# Patient Record
Sex: Male | Born: 1955 | Race: White | Hispanic: No | Marital: Married | Smoking: Current every day smoker
Health system: Southern US, Community
[De-identification: ages and names within clinical notes are randomized; demographics above are authoritative.]

## PROBLEM LIST (undated history)

## (undated) DIAGNOSIS — H9190 Unspecified hearing loss, unspecified ear: Secondary | ICD-10-CM

## (undated) DIAGNOSIS — F1190 Opioid use, unspecified, uncomplicated: Secondary | ICD-10-CM

## (undated) DIAGNOSIS — L97329 Non-pressure chronic ulcer of left ankle with unspecified severity: Secondary | ICD-10-CM

## (undated) DIAGNOSIS — T8859XA Other complications of anesthesia, initial encounter: Secondary | ICD-10-CM

## (undated) DIAGNOSIS — Z720 Tobacco use: Secondary | ICD-10-CM

## (undated) DIAGNOSIS — E109 Type 1 diabetes mellitus without complications: Secondary | ICD-10-CM

## (undated) DIAGNOSIS — F329 Major depressive disorder, single episode, unspecified: Secondary | ICD-10-CM

## (undated) DIAGNOSIS — F32A Depression, unspecified: Secondary | ICD-10-CM

## (undated) DIAGNOSIS — T4145XA Adverse effect of unspecified anesthetic, initial encounter: Secondary | ICD-10-CM

## (undated) DIAGNOSIS — F419 Anxiety disorder, unspecified: Secondary | ICD-10-CM

## (undated) DIAGNOSIS — G4700 Insomnia, unspecified: Secondary | ICD-10-CM

## (undated) DIAGNOSIS — E785 Hyperlipidemia, unspecified: Secondary | ICD-10-CM

## (undated) DIAGNOSIS — H539 Unspecified visual disturbance: Secondary | ICD-10-CM

## (undated) DIAGNOSIS — M1990 Unspecified osteoarthritis, unspecified site: Secondary | ICD-10-CM

## (undated) DIAGNOSIS — I10 Essential (primary) hypertension: Secondary | ICD-10-CM

## (undated) DIAGNOSIS — E1142 Type 2 diabetes mellitus with diabetic polyneuropathy: Secondary | ICD-10-CM

## (undated) HISTORY — DX: Major depressive disorder, single episode, unspecified: F32.9

## (undated) HISTORY — DX: Unspecified osteoarthritis, unspecified site: M19.90

## (undated) HISTORY — DX: Hyperlipidemia, unspecified: E78.5

## (undated) HISTORY — DX: Insomnia, unspecified: G47.00

## (undated) HISTORY — PX: TONSILLECTOMY: SUR1361

## (undated) HISTORY — DX: Anxiety disorder, unspecified: F41.9

## (undated) HISTORY — DX: Tobacco use: Z72.0

## (undated) HISTORY — DX: Unspecified hearing loss, unspecified ear: H91.90

## (undated) HISTORY — DX: Essential (primary) hypertension: I10

## (undated) HISTORY — PX: FOOT SURGERY: SHX648

## (undated) HISTORY — DX: Unspecified visual disturbance: H53.9

## (undated) HISTORY — DX: Depression, unspecified: F32.A

---

## 1997-12-15 ENCOUNTER — Emergency Department (HOSPITAL_COMMUNITY): Admission: EM | Admit: 1997-12-15 | Discharge: 1997-12-15 | Payer: Self-pay | Admitting: Emergency Medicine

## 1997-12-23 ENCOUNTER — Encounter: Payer: Self-pay | Admitting: Neurosurgery

## 1997-12-23 HISTORY — PX: LUMBAR LAMINECTOMY/DECOMPRESSION MICRODISCECTOMY: SHX5026

## 1997-12-24 ENCOUNTER — Encounter: Payer: Self-pay | Admitting: Neurosurgery

## 1997-12-24 ENCOUNTER — Inpatient Hospital Stay (HOSPITAL_COMMUNITY): Admission: AD | Admit: 1997-12-24 | Discharge: 1997-12-25 | Payer: Self-pay | Admitting: Neurosurgery

## 1998-01-04 ENCOUNTER — Encounter: Payer: Self-pay | Admitting: Neurosurgery

## 1998-01-05 ENCOUNTER — Encounter: Payer: Self-pay | Admitting: Neurosurgery

## 1998-01-05 ENCOUNTER — Inpatient Hospital Stay (HOSPITAL_COMMUNITY): Admission: RE | Admit: 1998-01-05 | Discharge: 1998-01-06 | Payer: Self-pay | Admitting: Neurosurgery

## 1998-03-22 ENCOUNTER — Ambulatory Visit (HOSPITAL_COMMUNITY): Admission: RE | Admit: 1998-03-22 | Discharge: 1998-03-22 | Payer: Self-pay | Admitting: Neurosurgery

## 2000-12-28 ENCOUNTER — Emergency Department (HOSPITAL_COMMUNITY): Admission: EM | Admit: 2000-12-28 | Discharge: 2000-12-28 | Payer: Self-pay | Admitting: Emergency Medicine

## 2000-12-28 ENCOUNTER — Encounter: Payer: Self-pay | Admitting: Emergency Medicine

## 2002-10-30 ENCOUNTER — Encounter (HOSPITAL_BASED_OUTPATIENT_CLINIC_OR_DEPARTMENT_OTHER): Payer: Self-pay | Admitting: Internal Medicine

## 2002-10-30 ENCOUNTER — Encounter: Admission: RE | Admit: 2002-10-30 | Discharge: 2002-10-30 | Payer: Self-pay | Admitting: Internal Medicine

## 2003-07-03 ENCOUNTER — Emergency Department (HOSPITAL_COMMUNITY): Admission: EM | Admit: 2003-07-03 | Discharge: 2003-07-04 | Payer: Self-pay | Admitting: Emergency Medicine

## 2003-08-03 ENCOUNTER — Ambulatory Visit: Admission: RE | Admit: 2003-08-03 | Discharge: 2003-08-03 | Payer: Self-pay | Admitting: Internal Medicine

## 2004-07-05 ENCOUNTER — Ambulatory Visit (HOSPITAL_COMMUNITY): Admission: RE | Admit: 2004-07-05 | Discharge: 2004-07-05 | Payer: Self-pay | Admitting: Orthopedic Surgery

## 2008-09-04 ENCOUNTER — Encounter: Admission: RE | Admit: 2008-09-04 | Discharge: 2008-09-04 | Payer: Self-pay | Admitting: Sports Medicine

## 2010-03-28 ENCOUNTER — Encounter (INDEPENDENT_AMBULATORY_CARE_PROVIDER_SITE_OTHER): Payer: Self-pay | Admitting: *Deleted

## 2010-04-05 NOTE — Letter (Signed)
Summary: Pre Visit Letter Revised  [facility]  [address] [place], [place] [address]   Phone: [phone]  Fax: [phone]        03/28/2010 MRN: [id number] [facility] [address] [place], [place]  [address]             Procedure Date:  05-09-10           Direct Colon---Dr. Jarold Motto   Welcome to the Gastroenterology Division at [facility].    You are scheduled to see a nurse for your pre-procedure visit on 04-25-10 at 1:30p.m. on the 3rd floor at [facility], [address]  We ask that you try to arrive at our office 15 minutes prior to your appointment time to allow for check-in.  Please take a minute to review the attached form.  If you answer "Yes" to one or more of the questions on the first page, we ask that you call the person listed at your earliest opportunity.  If you answer "No" to all of the questions, please complete the rest of the form and bring it to your appointment.    Your nurse visit will consist of discussing your medical and surgical history, your immediate family medical history, and your medications.   If you are unable to list all of your medications on the form, please bring the medication bottles to your appointment and we will list them.  We will need to be aware of both prescribed and over the counter drugs.  We will need to know exact dosage information as well.    Please be prepared to read and sign documents such as consent forms, a financial agreement, and acknowledgement forms.  If necessary, and with your consent, a friend or relative is welcome to sit-in on the nurse visit with you.  Please bring your insurance card so that we may make a copy of it.  If your insurance requires a referral to see a specialist, please bring your referral form from your primary care physician.  No co-pay is required for this nurse visit.     If you cannot keep your appointment, please call [phone] to cancel or reschedule prior to your  appointment date.  This allows [place] the opportunity to schedule an appointment for another patient in need of care.    Thank you for choosing [facility] for your medical needs.  We appreciate the opportunity to care for you.  Please visit [place] at our website  to learn more about our practice.  Sincerely, The Gastroenterology Division

## 2010-04-25 ENCOUNTER — Ambulatory Visit (AMBULATORY_SURGERY_CENTER): Payer: Medicare Other | Admitting: *Deleted

## 2010-04-25 VITALS — Ht 70.5 in | Wt 177.0 lb

## 2010-04-25 DIAGNOSIS — Z1211 Encounter for screening for malignant neoplasm of colon: Secondary | ICD-10-CM

## 2010-04-25 MED ORDER — PEG-KCL-NACL-NASULF-NA ASC-C 100 G PO SOLR: ORAL | Status: DC

## 2010-05-09 ENCOUNTER — Other Ambulatory Visit: Payer: Self-pay | Admitting: Gastroenterology

## 2010-06-01 ENCOUNTER — Encounter: Payer: Medicare Other | Admitting: Gastroenterology

## 2010-06-03 NOTE — Op Note (Signed)
[place]  Patient:    [name], [name] Visit Number: [id number] MRN: [id number]          Service Type: EXP Location: [facility] Attending Physician:  Tobey Bride Proc. Date: 12/23/97 Admit Date:  12/28/2000 Discharge Date: 12/28/2000                             Operative Report  PREOPERATIVE DIAGNOSIS:  Left herniated nucleus pulposus, L5-S1.  POSTOPERATIVE DIAGNOSIS:  Left herniated nucleus pulposus, L5-S1.  OPERATION:  Left L5-S1 semihemilaminectomy and diskectomy with microscope for microdissection.  SURGEON:  Clydene Fake, M.D.  ASSISTANTClide Cliff D. Gasper Sells, M.D.  ANESTHESIA:  General endotracheal tube.  ESTIMATED BLOOD LOSS:  Minimal.  BLOOD GIVEN:  None.  DRAINS:  None.  COMPLICATIONS:  None.  INDICATIONS:  The patient is a 55 year old man, who has had severe left lower extremity pain going towards his heel with numbness of his foot and weakness on  plantar flexion, who had an MRI and had a large disk herniation at left-sided 5-1. The patient admitted for surgery.  DESCRIPTION OF PROCEDURE:  The patient brought to the operating room and general anesthesia was induced.  The patient was placed in a prone position on the Wilson frame and all pressure points were padded.  The patient was then prepped and draped in the usual sterile fashion.  The site of the incision was injected with 10 cc of 1% lidocaine with epinephrine.  The needle was placed in back and x-ray obtained showing it was pointing at the L5 disk space.  An incision was then made in the  midline just below where the needle was inserted.  The incision was taken down o the fascia and the fascia was incised on the left side and subperiosteal dissection was done over the L5 and S2 to the spinal processes and lamina.  A marker was placed at the L5-S1 interspace and an x-ray obtained showing that we were at the correct interspace.  The microscope was brought  and under the microscope, microscopic dissection was done using the drill to perform a semihemilaminectomy at the L5-S1.  Kerrison punches were then used to complete this.  The ligamentum flavum was removed and nerve root and dura were seen.  The S-1 nerve root was retracted gently medially and the disk space was found with herniated disk. The disk space was incised with #15 blade and diskectomy was performed with pituitary rongeurs and curettes.  There was a low lying L5 nerve root seen, in fact, coming off of the socket, pretty much horizontal with the foramina there, and there was a small free fragment of disk under the dura.  After decompressing nerve roots, the wound was irrigated with antibiotic solution.  Hemostasis in the epidural space was obtained with bipolar cauterization and Gelfoam and thrombin.  The retractors were removed.  Marcaine was injected into the paraspinous muscles and was irrigated with antibiotic solution some more.  The fascia was then closed with 0 Vicryl in interrupted sutures and the subcutaneous tissue was closed with 0, 2-0 and 3-0 Vicryl in interrupted sutures.  The wound was steri-stripped.  Dressing was placed. The patient was placed back in the supine position and transferred to the recovery room. Attending Physician:  Tobey Bride DD:  12/23/97 TD:  12/25/97 Job: [id number]

## 2010-06-16 ENCOUNTER — Telehealth: Payer: Self-pay | Admitting: Internal Medicine

## 2010-06-16 NOTE — Telephone Encounter (Signed)
Not my patient. Thanks.

## 2010-06-17 NOTE — Telephone Encounter (Signed)
Patient rescheduled colon out side of the alloted time, Per Dr Jarold Motto bill for reschedule.

## 2010-06-17 NOTE — Telephone Encounter (Signed)
What is this about?

## 2010-06-20 ENCOUNTER — Encounter: Payer: Medicare Other | Admitting: Gastroenterology

## 2010-07-26 ENCOUNTER — Encounter: Payer: Self-pay | Admitting: Gastroenterology

## 2010-07-26 ENCOUNTER — Ambulatory Visit (AMBULATORY_SURGERY_CENTER): Payer: Medicare Other | Admitting: Gastroenterology

## 2010-07-26 VITALS — BP 137/64 | HR 64 | Temp 97.6°F | Resp 22 | Ht 70.5 in | Wt 177.0 lb

## 2010-07-26 DIAGNOSIS — Z1211 Encounter for screening for malignant neoplasm of colon: Secondary | ICD-10-CM | POA: Insufficient documentation

## 2010-07-26 HISTORY — PX: COLONOSCOPY WITH PROPOFOL: SHX5780

## 2010-07-26 MED ORDER — SODIUM CHLORIDE 0.9 % IV SOLN: 500.0000 mL | INTRAVENOUS | Status: DC

## 2010-07-26 NOTE — Patient Instructions (Signed)
Please read the handouts given to you by your recovery room nurse.   Due to your hemorrhoids, please increase the fiber in your diet.  Your next colonoscopy will be in 10 years, and we will send you a reminder.   If you have any questions or concerns, please call us at [phone]. Thank-you.

## 2010-07-27 ENCOUNTER — Telehealth: Payer: Self-pay

## 2010-07-27 NOTE — Telephone Encounter (Signed)

## 2010-09-21 ENCOUNTER — Other Ambulatory Visit: Payer: Self-pay | Admitting: Gastroenterology

## 2011-03-09 DIAGNOSIS — R05 Cough: Secondary | ICD-10-CM | POA: Diagnosis not present

## 2011-03-09 DIAGNOSIS — R059 Cough, unspecified: Secondary | ICD-10-CM | POA: Diagnosis not present

## 2011-03-09 DIAGNOSIS — I10 Essential (primary) hypertension: Secondary | ICD-10-CM | POA: Diagnosis not present

## 2011-03-09 DIAGNOSIS — E291 Testicular hypofunction: Secondary | ICD-10-CM | POA: Diagnosis not present

## 2011-03-09 DIAGNOSIS — E1139 Type 2 diabetes mellitus with other diabetic ophthalmic complication: Secondary | ICD-10-CM | POA: Diagnosis not present

## 2011-06-22 DIAGNOSIS — M79609 Pain in unspecified limb: Secondary | ICD-10-CM | POA: Diagnosis not present

## 2011-06-22 DIAGNOSIS — I739 Peripheral vascular disease, unspecified: Secondary | ICD-10-CM | POA: Diagnosis not present

## 2011-06-22 DIAGNOSIS — E1159 Type 2 diabetes mellitus with other circulatory complications: Secondary | ICD-10-CM | POA: Diagnosis not present

## 2011-06-22 DIAGNOSIS — E1139 Type 2 diabetes mellitus with other diabetic ophthalmic complication: Secondary | ICD-10-CM | POA: Diagnosis not present

## 2011-09-28 DIAGNOSIS — Z23 Encounter for immunization: Secondary | ICD-10-CM | POA: Diagnosis not present

## 2011-09-28 DIAGNOSIS — E1159 Type 2 diabetes mellitus with other circulatory complications: Secondary | ICD-10-CM | POA: Diagnosis not present

## 2011-09-28 DIAGNOSIS — I10 Essential (primary) hypertension: Secondary | ICD-10-CM | POA: Diagnosis not present

## 2011-09-28 DIAGNOSIS — R05 Cough: Secondary | ICD-10-CM | POA: Diagnosis not present

## 2011-09-28 DIAGNOSIS — R059 Cough, unspecified: Secondary | ICD-10-CM | POA: Diagnosis not present

## 2012-01-12 ENCOUNTER — Other Ambulatory Visit: Payer: Self-pay | Admitting: Gastroenterology

## 2012-01-26 DIAGNOSIS — I739 Peripheral vascular disease, unspecified: Secondary | ICD-10-CM | POA: Diagnosis not present

## 2012-01-26 DIAGNOSIS — E1159 Type 2 diabetes mellitus with other circulatory complications: Secondary | ICD-10-CM | POA: Diagnosis not present

## 2012-01-26 DIAGNOSIS — M545 Low back pain, unspecified: Secondary | ICD-10-CM | POA: Diagnosis not present

## 2012-01-26 DIAGNOSIS — I10 Essential (primary) hypertension: Secondary | ICD-10-CM | POA: Diagnosis not present

## 2012-04-02 DIAGNOSIS — M722 Plantar fascial fibromatosis: Secondary | ICD-10-CM | POA: Diagnosis not present

## 2012-04-17 DIAGNOSIS — I10 Essential (primary) hypertension: Secondary | ICD-10-CM | POA: Diagnosis not present

## 2012-04-17 DIAGNOSIS — E1139 Type 2 diabetes mellitus with other diabetic ophthalmic complication: Secondary | ICD-10-CM | POA: Diagnosis not present

## 2012-04-17 DIAGNOSIS — Z125 Encounter for screening for malignant neoplasm of prostate: Secondary | ICD-10-CM | POA: Diagnosis not present

## 2012-04-17 DIAGNOSIS — E785 Hyperlipidemia, unspecified: Secondary | ICD-10-CM | POA: Diagnosis not present

## 2012-04-17 DIAGNOSIS — I739 Peripheral vascular disease, unspecified: Secondary | ICD-10-CM | POA: Diagnosis not present

## 2012-04-22 DIAGNOSIS — Z0000 Encounter for general adult medical examination without abnormal findings: Secondary | ICD-10-CM | POA: Diagnosis not present

## 2012-04-22 DIAGNOSIS — Z125 Encounter for screening for malignant neoplasm of prostate: Secondary | ICD-10-CM | POA: Diagnosis not present

## 2012-04-22 DIAGNOSIS — I10 Essential (primary) hypertension: Secondary | ICD-10-CM | POA: Diagnosis not present

## 2012-04-22 DIAGNOSIS — E291 Testicular hypofunction: Secondary | ICD-10-CM | POA: Diagnosis not present

## 2012-04-22 DIAGNOSIS — R05 Cough: Secondary | ICD-10-CM | POA: Diagnosis not present

## 2012-04-22 DIAGNOSIS — Z1212 Encounter for screening for malignant neoplasm of rectum: Secondary | ICD-10-CM | POA: Diagnosis not present

## 2012-04-22 DIAGNOSIS — E1159 Type 2 diabetes mellitus with other circulatory complications: Secondary | ICD-10-CM | POA: Diagnosis not present

## 2012-04-22 DIAGNOSIS — R059 Cough, unspecified: Secondary | ICD-10-CM | POA: Diagnosis not present

## 2012-04-22 DIAGNOSIS — E785 Hyperlipidemia, unspecified: Secondary | ICD-10-CM | POA: Diagnosis not present

## 2012-04-22 DIAGNOSIS — N182 Chronic kidney disease, stage 2 (mild): Secondary | ICD-10-CM | POA: Diagnosis not present

## 2012-04-22 DIAGNOSIS — I739 Peripheral vascular disease, unspecified: Secondary | ICD-10-CM | POA: Diagnosis not present

## 2012-05-09 DIAGNOSIS — E1159 Type 2 diabetes mellitus with other circulatory complications: Secondary | ICD-10-CM | POA: Diagnosis not present

## 2012-05-09 DIAGNOSIS — I10 Essential (primary) hypertension: Secondary | ICD-10-CM | POA: Diagnosis not present

## 2012-05-09 DIAGNOSIS — N529 Male erectile dysfunction, unspecified: Secondary | ICD-10-CM | POA: Diagnosis not present

## 2012-05-09 DIAGNOSIS — E291 Testicular hypofunction: Secondary | ICD-10-CM | POA: Diagnosis not present

## 2012-05-13 DIAGNOSIS — H16149 Punctate keratitis, unspecified eye: Secondary | ICD-10-CM | POA: Diagnosis not present

## 2012-05-14 DIAGNOSIS — M722 Plantar fascial fibromatosis: Secondary | ICD-10-CM | POA: Diagnosis not present

## 2012-05-14 DIAGNOSIS — D1739 Benign lipomatous neoplasm of skin and subcutaneous tissue of other sites: Secondary | ICD-10-CM | POA: Diagnosis not present

## 2012-05-27 DIAGNOSIS — H16149 Punctate keratitis, unspecified eye: Secondary | ICD-10-CM | POA: Diagnosis not present

## 2012-05-28 DIAGNOSIS — I10 Essential (primary) hypertension: Secondary | ICD-10-CM | POA: Diagnosis not present

## 2012-05-28 DIAGNOSIS — R609 Edema, unspecified: Secondary | ICD-10-CM | POA: Diagnosis not present

## 2012-05-28 DIAGNOSIS — E1139 Type 2 diabetes mellitus with other diabetic ophthalmic complication: Secondary | ICD-10-CM | POA: Diagnosis not present

## 2012-05-28 DIAGNOSIS — E1159 Type 2 diabetes mellitus with other circulatory complications: Secondary | ICD-10-CM | POA: Diagnosis not present

## 2012-05-28 DIAGNOSIS — Z6825 Body mass index (BMI) 25.0-25.9, adult: Secondary | ICD-10-CM | POA: Diagnosis not present

## 2012-05-28 DIAGNOSIS — N182 Chronic kidney disease, stage 2 (mild): Secondary | ICD-10-CM | POA: Diagnosis not present

## 2012-06-12 ENCOUNTER — Encounter (INDEPENDENT_AMBULATORY_CARE_PROVIDER_SITE_OTHER): Payer: Medicare Other | Admitting: *Deleted

## 2012-06-12 DIAGNOSIS — Z6825 Body mass index (BMI) 25.0-25.9, adult: Secondary | ICD-10-CM | POA: Diagnosis not present

## 2012-06-12 DIAGNOSIS — R609 Edema, unspecified: Secondary | ICD-10-CM

## 2012-06-12 DIAGNOSIS — E1139 Type 2 diabetes mellitus with other diabetic ophthalmic complication: Secondary | ICD-10-CM | POA: Diagnosis not present

## 2012-06-12 DIAGNOSIS — I10 Essential (primary) hypertension: Secondary | ICD-10-CM | POA: Diagnosis not present

## 2012-06-17 ENCOUNTER — Encounter: Payer: Self-pay | Admitting: Internal Medicine

## 2012-07-01 DIAGNOSIS — M79609 Pain in unspecified limb: Secondary | ICD-10-CM | POA: Diagnosis not present

## 2012-07-03 DIAGNOSIS — M7989 Other specified soft tissue disorders: Secondary | ICD-10-CM | POA: Diagnosis not present

## 2012-07-03 DIAGNOSIS — L0291 Cutaneous abscess, unspecified: Secondary | ICD-10-CM | POA: Diagnosis not present

## 2012-07-04 DIAGNOSIS — I10 Essential (primary) hypertension: Secondary | ICD-10-CM | POA: Diagnosis not present

## 2012-07-04 DIAGNOSIS — L0291 Cutaneous abscess, unspecified: Secondary | ICD-10-CM | POA: Diagnosis not present

## 2012-07-18 ENCOUNTER — Other Ambulatory Visit: Payer: Self-pay | Admitting: Gastroenterology

## 2012-07-22 DIAGNOSIS — M7989 Other specified soft tissue disorders: Secondary | ICD-10-CM | POA: Diagnosis not present

## 2012-07-29 DIAGNOSIS — IMO0002 Reserved for concepts with insufficient information to code with codable children: Secondary | ICD-10-CM | POA: Diagnosis not present

## 2012-08-01 DIAGNOSIS — IMO0002 Reserved for concepts with insufficient information to code with codable children: Secondary | ICD-10-CM | POA: Diagnosis not present

## 2012-08-05 DIAGNOSIS — IMO0002 Reserved for concepts with insufficient information to code with codable children: Secondary | ICD-10-CM | POA: Diagnosis not present

## 2012-08-06 DIAGNOSIS — IMO0002 Reserved for concepts with insufficient information to code with codable children: Secondary | ICD-10-CM | POA: Diagnosis not present

## 2012-08-13 DIAGNOSIS — IMO0002 Reserved for concepts with insufficient information to code with codable children: Secondary | ICD-10-CM | POA: Diagnosis not present

## 2012-08-19 DIAGNOSIS — IMO0002 Reserved for concepts with insufficient information to code with codable children: Secondary | ICD-10-CM | POA: Diagnosis not present

## 2012-08-22 DIAGNOSIS — M79609 Pain in unspecified limb: Secondary | ICD-10-CM | POA: Diagnosis not present

## 2012-08-22 DIAGNOSIS — IMO0002 Reserved for concepts with insufficient information to code with codable children: Secondary | ICD-10-CM | POA: Diagnosis not present

## 2012-08-22 DIAGNOSIS — E1139 Type 2 diabetes mellitus with other diabetic ophthalmic complication: Secondary | ICD-10-CM | POA: Diagnosis not present

## 2012-08-22 DIAGNOSIS — Z6825 Body mass index (BMI) 25.0-25.9, adult: Secondary | ICD-10-CM | POA: Diagnosis not present

## 2012-08-22 DIAGNOSIS — E1159 Type 2 diabetes mellitus with other circulatory complications: Secondary | ICD-10-CM | POA: Diagnosis not present

## 2012-08-22 DIAGNOSIS — I10 Essential (primary) hypertension: Secondary | ICD-10-CM | POA: Diagnosis not present

## 2012-08-22 DIAGNOSIS — I739 Peripheral vascular disease, unspecified: Secondary | ICD-10-CM | POA: Diagnosis not present

## 2012-08-23 DIAGNOSIS — M7989 Other specified soft tissue disorders: Secondary | ICD-10-CM | POA: Diagnosis not present

## 2012-10-01 ENCOUNTER — Ambulatory Visit: Payer: Medicare Other | Admitting: Endocrinology

## 2012-10-02 ENCOUNTER — Ambulatory Visit (INDEPENDENT_AMBULATORY_CARE_PROVIDER_SITE_OTHER): Payer: Medicare Other | Admitting: Endocrinology

## 2012-10-02 ENCOUNTER — Encounter: Payer: Self-pay | Admitting: Endocrinology

## 2012-10-02 VITALS — BP 122/72 | HR 65 | Ht 70.0 in | Wt 184.0 lb

## 2012-10-02 DIAGNOSIS — E1049 Type 1 diabetes mellitus with other diabetic neurological complication: Secondary | ICD-10-CM | POA: Diagnosis not present

## 2012-10-02 DIAGNOSIS — M25562 Pain in left knee: Secondary | ICD-10-CM

## 2012-10-02 DIAGNOSIS — M25569 Pain in unspecified knee: Secondary | ICD-10-CM | POA: Insufficient documentation

## 2012-10-02 DIAGNOSIS — I10 Essential (primary) hypertension: Secondary | ICD-10-CM

## 2012-10-02 DIAGNOSIS — E785 Hyperlipidemia, unspecified: Secondary | ICD-10-CM | POA: Diagnosis not present

## 2012-10-02 DIAGNOSIS — G4700 Insomnia, unspecified: Secondary | ICD-10-CM

## 2012-10-02 NOTE — Patient Instructions (Addendum)
Let's check a circulation test.  you will receive a phone call, about a day and time for an appointment. If this does not show a cause, the only other test i can think of is a nerve-ending test.  Please let me know if you want me to request this.
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
## 2012-10-03 DIAGNOSIS — J189 Pneumonia, unspecified organism: Secondary | ICD-10-CM | POA: Insufficient documentation

## 2012-10-03 DIAGNOSIS — G4700 Insomnia, unspecified: Secondary | ICD-10-CM | POA: Insufficient documentation

## 2012-10-03 DIAGNOSIS — E1049 Type 1 diabetes mellitus with other diabetic neurological complication: Secondary | ICD-10-CM | POA: Insufficient documentation

## 2012-10-03 DIAGNOSIS — E785 Hyperlipidemia, unspecified: Secondary | ICD-10-CM | POA: Insufficient documentation

## 2012-10-07 ENCOUNTER — Encounter (INDEPENDENT_AMBULATORY_CARE_PROVIDER_SITE_OTHER): Payer: Medicare Other

## 2012-10-07 DIAGNOSIS — I739 Peripheral vascular disease, unspecified: Secondary | ICD-10-CM | POA: Diagnosis not present

## 2012-10-07 DIAGNOSIS — R200 Anesthesia of skin: Secondary | ICD-10-CM

## 2012-12-04 DIAGNOSIS — I739 Peripheral vascular disease, unspecified: Secondary | ICD-10-CM | POA: Diagnosis not present

## 2012-12-04 DIAGNOSIS — F329 Major depressive disorder, single episode, unspecified: Secondary | ICD-10-CM | POA: Diagnosis not present

## 2012-12-04 DIAGNOSIS — M545 Low back pain, unspecified: Secondary | ICD-10-CM | POA: Diagnosis not present

## 2012-12-04 DIAGNOSIS — E11319 Type 2 diabetes mellitus with unspecified diabetic retinopathy without macular edema: Secondary | ICD-10-CM | POA: Diagnosis not present

## 2012-12-04 DIAGNOSIS — E119 Type 2 diabetes mellitus without complications: Secondary | ICD-10-CM | POA: Diagnosis not present

## 2012-12-04 DIAGNOSIS — F3289 Other specified depressive episodes: Secondary | ICD-10-CM | POA: Diagnosis not present

## 2012-12-04 DIAGNOSIS — I10 Essential (primary) hypertension: Secondary | ICD-10-CM | POA: Diagnosis not present

## 2012-12-04 DIAGNOSIS — E1159 Type 2 diabetes mellitus with other circulatory complications: Secondary | ICD-10-CM | POA: Diagnosis not present

## 2012-12-04 DIAGNOSIS — L97909 Non-pressure chronic ulcer of unspecified part of unspecified lower leg with unspecified severity: Secondary | ICD-10-CM | POA: Diagnosis not present

## 2012-12-10 ENCOUNTER — Encounter (HOSPITAL_BASED_OUTPATIENT_CLINIC_OR_DEPARTMENT_OTHER): Payer: Medicare Other

## 2012-12-16 ENCOUNTER — Encounter (HOSPITAL_BASED_OUTPATIENT_CLINIC_OR_DEPARTMENT_OTHER): Payer: Medicare Other | Attending: General Surgery

## 2012-12-16 DIAGNOSIS — I10 Essential (primary) hypertension: Secondary | ICD-10-CM | POA: Insufficient documentation

## 2012-12-16 DIAGNOSIS — A4901 Methicillin susceptible Staphylococcus aureus infection, unspecified site: Secondary | ICD-10-CM | POA: Insufficient documentation

## 2012-12-16 DIAGNOSIS — E1149 Type 2 diabetes mellitus with other diabetic neurological complication: Secondary | ICD-10-CM | POA: Insufficient documentation

## 2012-12-16 DIAGNOSIS — L97309 Non-pressure chronic ulcer of unspecified ankle with unspecified severity: Secondary | ICD-10-CM | POA: Insufficient documentation

## 2012-12-16 DIAGNOSIS — Z794 Long term (current) use of insulin: Secondary | ICD-10-CM | POA: Diagnosis not present

## 2012-12-16 DIAGNOSIS — E1142 Type 2 diabetes mellitus with diabetic polyneuropathy: Secondary | ICD-10-CM | POA: Diagnosis not present

## 2012-12-16 DIAGNOSIS — Z79899 Other long term (current) drug therapy: Secondary | ICD-10-CM | POA: Insufficient documentation

## 2012-12-16 DIAGNOSIS — E1169 Type 2 diabetes mellitus with other specified complication: Secondary | ICD-10-CM | POA: Insufficient documentation

## 2012-12-17 NOTE — H&P (Signed)
[name], [name]                  ACCOUNT NO.:  [id number]  MEDICAL RECORD NO.:  [id number]  LOCATION:  FOOT                         FACILITY:  [facility]  PHYSICIAN:  Joanne Gavel, M.D.        DATE OF BIRTH:  02/01/1955  DATE OF ADMISSION:  12/16/2012 DATE OF DISCHARGE:                             HISTORY & PHYSICAL   CHIEF COMPLAINT:  Wound, left ankle.  HISTORY OF PRESENT ILLNESS:  This is a 57 year old diabetic male developed this wound, approximately 8 months ago.  She has been treated with antibiotic ointments basically, started out as a blister.  It is not particularly painful or tender.  The patient has significant neuropathy particularly of the left foot.  PAST MEDICAL HISTORY:  Significant for diabetes, peripheral neuropathy, depression, hypertension, hyperlipidemia.  PAST SURGICAL HISTORY:  She has had foot surgery with hardware in the left ankle.  She has had back surgery.  SOCIAL HISTORY:  Cigarettes 1/2 to 1 pack per day.  Alcohol none.  MEDICATIONS:  Doxazosin, Lopid, Flonase, amlodipine, metformin, Benicar, benzonatate, Ambien, Cialis, Depakote, Lantus insulin, methadone, Naprosyn, Neurontin, Pamelor, testosterone cypionate, Valium, Glucotrol, hydrocodone, Robaxin, Zofran, Zoloft, simvastatin, baby aspirin, Lopressor, Viagra.  ALLERGIES:  CODEINE causes vomiting, AZITHROMYCIN has caused hives.  REVIEW OF SYSTEMS:  The patient has no history of digestive difficulty, no history of asthma, emphysema, bronchitis, pneumonia.  No myocardial infarction.  No Alzheimer's, recent convulsions, or tremors.  PHYSICAL EXAMINATION:  VITAL SIGNS:  Temperature 98.2, pulse 65, respirations 18, blood pressure 148/73.  Glucose is 120. GENERAL:  Well developed, well nourished. CHEST:  Clear. HEART:  Regular rhythm. EXTREMITIES:  Examination of the lower extremities reveals an ABI on the left of 1.1, on the medial malleolus there is a 0.5 x 0.7 x 0.1 wound with a slightly shaggy  base.  There is no surrounding inflammation.  The wound is at the site of a previous surgical scar.  IMPRESSION:  Diabetic ulcer Wagner II, left medial malleolus.  The patient tells [place] that she has already had x-rays and MRIs and rather recent blood work, so we will try and obtain this information rather than repeat it.  She has a bounding dorsalis pedis and posterior tibial pulse, so we will forego vascular studies at present.  We will start treating with Santyl and Hydrogel.  We will see her in 7 days.     Joanne Gavel, M.D.     RA/MEDQ  D:  12/16/2012  T:  12/17/2012  Job:  [id number]

## 2012-12-23 DIAGNOSIS — A4901 Methicillin susceptible Staphylococcus aureus infection, unspecified site: Secondary | ICD-10-CM | POA: Diagnosis not present

## 2012-12-23 DIAGNOSIS — L97309 Non-pressure chronic ulcer of unspecified ankle with unspecified severity: Secondary | ICD-10-CM | POA: Diagnosis not present

## 2012-12-23 DIAGNOSIS — E1149 Type 2 diabetes mellitus with other diabetic neurological complication: Secondary | ICD-10-CM | POA: Diagnosis not present

## 2012-12-23 DIAGNOSIS — E1169 Type 2 diabetes mellitus with other specified complication: Secondary | ICD-10-CM | POA: Diagnosis not present

## 2012-12-30 DIAGNOSIS — E1169 Type 2 diabetes mellitus with other specified complication: Secondary | ICD-10-CM | POA: Diagnosis not present

## 2012-12-30 DIAGNOSIS — A4901 Methicillin susceptible Staphylococcus aureus infection, unspecified site: Secondary | ICD-10-CM | POA: Diagnosis not present

## 2012-12-30 DIAGNOSIS — L97309 Non-pressure chronic ulcer of unspecified ankle with unspecified severity: Secondary | ICD-10-CM | POA: Diagnosis not present

## 2012-12-30 DIAGNOSIS — E1149 Type 2 diabetes mellitus with other diabetic neurological complication: Secondary | ICD-10-CM | POA: Diagnosis not present

## 2013-01-03 DIAGNOSIS — E1149 Type 2 diabetes mellitus with other diabetic neurological complication: Secondary | ICD-10-CM | POA: Diagnosis not present

## 2013-01-03 DIAGNOSIS — A4901 Methicillin susceptible Staphylococcus aureus infection, unspecified site: Secondary | ICD-10-CM | POA: Diagnosis not present

## 2013-01-03 DIAGNOSIS — E1169 Type 2 diabetes mellitus with other specified complication: Secondary | ICD-10-CM | POA: Diagnosis not present

## 2013-01-03 DIAGNOSIS — L97309 Non-pressure chronic ulcer of unspecified ankle with unspecified severity: Secondary | ICD-10-CM | POA: Diagnosis not present

## 2013-01-08 DIAGNOSIS — E1149 Type 2 diabetes mellitus with other diabetic neurological complication: Secondary | ICD-10-CM | POA: Diagnosis not present

## 2013-01-08 DIAGNOSIS — L97309 Non-pressure chronic ulcer of unspecified ankle with unspecified severity: Secondary | ICD-10-CM | POA: Diagnosis not present

## 2013-01-08 DIAGNOSIS — E1169 Type 2 diabetes mellitus with other specified complication: Secondary | ICD-10-CM | POA: Diagnosis not present

## 2013-01-08 DIAGNOSIS — A4901 Methicillin susceptible Staphylococcus aureus infection, unspecified site: Secondary | ICD-10-CM | POA: Diagnosis not present

## 2013-01-08 NOTE — Progress Notes (Signed)
[facility]  NAMEHELIO, LACK                  ACCOUNT NO.:  [id number]  MEDICAL RECORD NO.:  [id number]      DATE OF BIRTH:  04-13-1955  PHYSICIAN:  Ardath Sax, M.D.           VISIT DATE:                                  OFFICE VISIT   [name] is a 57 year old diabetic woman, who has been coming to the wound clinic for a couple of months now with an ulcer on the medial left ankle.  She has been treated with silver alginate and multiple debridements.  She was even treated with Santyl as there were times where she had some necrotic looking tissue.  She has been diabetic for several years and she takes Glucotrol for this problem.  She also takes Neurontin for diabetic neuropathy.  She takes Depakote and Benicar for hypertension.  She also is on Lantus insulin for her diabetes, and she is on Lopressor 50 mg a day for hypertension.  She is also on Zoloft. This woman has not really improved much despite using collagen, alginate, Santyl, and multiple debridements.  I felt that she has had some element of venous hypertension, but now I feel that I will classify this really as a diabetic foot ulcer Wagner 3, as it does have some infection.  It is cultured out gram-positive cocci, which proved to be Staph aureus.  She is presently on Bactrim.  I feel that in light of this being a Wagner 3 diabetic wound with some secondary infection, that she is a good candidate for hyperbaric oxygen treatments as she has not really progressed with the usual treatment.  I thought if we could get this cleaned up and put her in hyperbaric oxygen, and it improved enough, we could be able to start using either Apligraf or Dermagraft.     Ardath Sax, M.D.     PP/MEDQ  D:  01/08/2013  T:  01/08/2013  Job:  [id number]

## 2013-01-17 ENCOUNTER — Encounter (HOSPITAL_BASED_OUTPATIENT_CLINIC_OR_DEPARTMENT_OTHER): Payer: Medicare Other | Attending: General Surgery

## 2013-01-17 DIAGNOSIS — L97309 Non-pressure chronic ulcer of unspecified ankle with unspecified severity: Secondary | ICD-10-CM | POA: Insufficient documentation

## 2013-01-17 DIAGNOSIS — E1169 Type 2 diabetes mellitus with other specified complication: Secondary | ICD-10-CM | POA: Insufficient documentation

## 2013-01-24 ENCOUNTER — Other Ambulatory Visit (HOSPITAL_COMMUNITY): Payer: Self-pay | Admitting: General Surgery

## 2013-01-24 ENCOUNTER — Ambulatory Visit (HOSPITAL_COMMUNITY)
Admission: RE | Admit: 2013-01-24 | Discharge: 2013-01-24 | Disposition: A | Discharge status: Home / Self Care | Payer: Medicare Other | Source: Ambulatory Visit | Attending: General Surgery | Admitting: General Surgery

## 2013-01-24 DIAGNOSIS — L97309 Non-pressure chronic ulcer of unspecified ankle with unspecified severity: Secondary | ICD-10-CM | POA: Diagnosis not present

## 2013-01-24 DIAGNOSIS — E1169 Type 2 diabetes mellitus with other specified complication: Secondary | ICD-10-CM | POA: Diagnosis not present

## 2013-01-24 DIAGNOSIS — T148XXA Other injury of unspecified body region, initial encounter: Secondary | ICD-10-CM

## 2013-01-24 DIAGNOSIS — Z01818 Encounter for other preprocedural examination: Secondary | ICD-10-CM | POA: Diagnosis not present

## 2013-01-31 DIAGNOSIS — L97309 Non-pressure chronic ulcer of unspecified ankle with unspecified severity: Secondary | ICD-10-CM | POA: Diagnosis not present

## 2013-01-31 DIAGNOSIS — E1169 Type 2 diabetes mellitus with other specified complication: Secondary | ICD-10-CM | POA: Diagnosis not present

## 2013-02-07 DIAGNOSIS — E1169 Type 2 diabetes mellitus with other specified complication: Secondary | ICD-10-CM | POA: Diagnosis not present

## 2013-02-07 DIAGNOSIS — L97309 Non-pressure chronic ulcer of unspecified ankle with unspecified severity: Secondary | ICD-10-CM | POA: Diagnosis not present

## 2013-02-10 DIAGNOSIS — L97309 Non-pressure chronic ulcer of unspecified ankle with unspecified severity: Secondary | ICD-10-CM | POA: Diagnosis not present

## 2013-02-10 DIAGNOSIS — E1169 Type 2 diabetes mellitus with other specified complication: Secondary | ICD-10-CM | POA: Diagnosis not present

## 2013-02-10 LAB — GLUCOSE, CAPILLARY
Glucose-Capillary: 197 mg/dL — ABNORMAL HIGH (ref 70–99)
Glucose-Capillary: 193 mg/dL — ABNORMAL HIGH (ref 70–99)

## 2013-02-11 DIAGNOSIS — L97309 Non-pressure chronic ulcer of unspecified ankle with unspecified severity: Secondary | ICD-10-CM | POA: Diagnosis not present

## 2013-02-11 DIAGNOSIS — E1169 Type 2 diabetes mellitus with other specified complication: Secondary | ICD-10-CM | POA: Diagnosis not present

## 2013-02-11 LAB — GLUCOSE, CAPILLARY
GLUCOSE-CAPILLARY: 194 mg/dL — AB (ref 70–99)
GLUCOSE-CAPILLARY: 194 mg/dL — AB (ref 70–99)

## 2013-02-12 DIAGNOSIS — E1169 Type 2 diabetes mellitus with other specified complication: Secondary | ICD-10-CM | POA: Diagnosis not present

## 2013-02-12 DIAGNOSIS — L97309 Non-pressure chronic ulcer of unspecified ankle with unspecified severity: Secondary | ICD-10-CM | POA: Diagnosis not present

## 2013-02-12 LAB — GLUCOSE, CAPILLARY
Glucose-Capillary: 182 mg/dL — ABNORMAL HIGH (ref 70–99)
Glucose-Capillary: 171 mg/dL — ABNORMAL HIGH (ref 70–99)

## 2013-02-13 DIAGNOSIS — L97309 Non-pressure chronic ulcer of unspecified ankle with unspecified severity: Secondary | ICD-10-CM | POA: Diagnosis not present

## 2013-02-13 DIAGNOSIS — E1169 Type 2 diabetes mellitus with other specified complication: Secondary | ICD-10-CM | POA: Diagnosis not present

## 2013-02-13 LAB — GLUCOSE, CAPILLARY
GLUCOSE-CAPILLARY: 171 mg/dL — AB (ref 70–99)
Glucose-Capillary: 167 mg/dL — ABNORMAL HIGH (ref 70–99)

## 2013-02-14 DIAGNOSIS — E1169 Type 2 diabetes mellitus with other specified complication: Secondary | ICD-10-CM | POA: Diagnosis not present

## 2013-02-14 DIAGNOSIS — L97309 Non-pressure chronic ulcer of unspecified ankle with unspecified severity: Secondary | ICD-10-CM | POA: Diagnosis not present

## 2013-02-14 LAB — GLUCOSE, CAPILLARY
Glucose-Capillary: 132 mg/dL — ABNORMAL HIGH (ref 70–99)
Glucose-Capillary: 143 mg/dL — ABNORMAL HIGH (ref 70–99)

## 2013-02-17 ENCOUNTER — Encounter (HOSPITAL_BASED_OUTPATIENT_CLINIC_OR_DEPARTMENT_OTHER): Payer: Medicare Other | Attending: General Surgery

## 2013-02-17 DIAGNOSIS — L97309 Non-pressure chronic ulcer of unspecified ankle with unspecified severity: Secondary | ICD-10-CM | POA: Diagnosis not present

## 2013-02-17 DIAGNOSIS — E1169 Type 2 diabetes mellitus with other specified complication: Secondary | ICD-10-CM | POA: Insufficient documentation

## 2013-02-18 DIAGNOSIS — E1169 Type 2 diabetes mellitus with other specified complication: Secondary | ICD-10-CM | POA: Diagnosis not present

## 2013-02-18 DIAGNOSIS — L97309 Non-pressure chronic ulcer of unspecified ankle with unspecified severity: Secondary | ICD-10-CM | POA: Diagnosis not present

## 2013-02-18 LAB — GLUCOSE, CAPILLARY: Glucose-Capillary: 181 mg/dL — ABNORMAL HIGH (ref 70–99)

## 2013-02-19 DIAGNOSIS — E1169 Type 2 diabetes mellitus with other specified complication: Secondary | ICD-10-CM | POA: Diagnosis not present

## 2013-02-19 DIAGNOSIS — L97309 Non-pressure chronic ulcer of unspecified ankle with unspecified severity: Secondary | ICD-10-CM | POA: Diagnosis not present

## 2013-02-19 LAB — GLUCOSE, CAPILLARY
GLUCOSE-CAPILLARY: 117 mg/dL — AB (ref 70–99)
GLUCOSE-CAPILLARY: 175 mg/dL — AB (ref 70–99)
Glucose-Capillary: 180 mg/dL — ABNORMAL HIGH (ref 70–99)
Glucose-Capillary: 168 mg/dL — ABNORMAL HIGH (ref 70–99)

## 2013-02-20 DIAGNOSIS — L97309 Non-pressure chronic ulcer of unspecified ankle with unspecified severity: Secondary | ICD-10-CM | POA: Diagnosis not present

## 2013-02-20 DIAGNOSIS — E1169 Type 2 diabetes mellitus with other specified complication: Secondary | ICD-10-CM | POA: Diagnosis not present

## 2013-02-20 LAB — GLUCOSE, CAPILLARY
Glucose-Capillary: 144 mg/dL — ABNORMAL HIGH (ref 70–99)
Glucose-Capillary: 188 mg/dL — ABNORMAL HIGH (ref 70–99)

## 2013-02-21 DIAGNOSIS — E1169 Type 2 diabetes mellitus with other specified complication: Secondary | ICD-10-CM | POA: Diagnosis not present

## 2013-02-21 DIAGNOSIS — L97309 Non-pressure chronic ulcer of unspecified ankle with unspecified severity: Secondary | ICD-10-CM | POA: Diagnosis not present

## 2013-02-21 LAB — GLUCOSE, CAPILLARY
GLUCOSE-CAPILLARY: 213 mg/dL — AB (ref 70–99)
Glucose-Capillary: 171 mg/dL — ABNORMAL HIGH (ref 70–99)

## 2013-02-24 DIAGNOSIS — E1169 Type 2 diabetes mellitus with other specified complication: Secondary | ICD-10-CM | POA: Diagnosis not present

## 2013-02-24 DIAGNOSIS — L97309 Non-pressure chronic ulcer of unspecified ankle with unspecified severity: Secondary | ICD-10-CM | POA: Diagnosis not present

## 2013-02-24 LAB — GLUCOSE, CAPILLARY
GLUCOSE-CAPILLARY: 239 mg/dL — AB (ref 70–99)
Glucose-Capillary: 262 mg/dL — ABNORMAL HIGH (ref 70–99)

## 2013-02-25 DIAGNOSIS — L97309 Non-pressure chronic ulcer of unspecified ankle with unspecified severity: Secondary | ICD-10-CM | POA: Diagnosis not present

## 2013-02-25 DIAGNOSIS — E1169 Type 2 diabetes mellitus with other specified complication: Secondary | ICD-10-CM | POA: Diagnosis not present

## 2013-02-25 LAB — GLUCOSE, CAPILLARY
GLUCOSE-CAPILLARY: 122 mg/dL — AB (ref 70–99)
Glucose-Capillary: 206 mg/dL — ABNORMAL HIGH (ref 70–99)

## 2013-02-26 DIAGNOSIS — L97309 Non-pressure chronic ulcer of unspecified ankle with unspecified severity: Secondary | ICD-10-CM | POA: Diagnosis not present

## 2013-02-26 DIAGNOSIS — E1169 Type 2 diabetes mellitus with other specified complication: Secondary | ICD-10-CM | POA: Diagnosis not present

## 2013-02-26 LAB — GLUCOSE, CAPILLARY
Glucose-Capillary: 149 mg/dL — ABNORMAL HIGH (ref 70–99)
Glucose-Capillary: 166 mg/dL — ABNORMAL HIGH (ref 70–99)

## 2013-02-27 DIAGNOSIS — E1169 Type 2 diabetes mellitus with other specified complication: Secondary | ICD-10-CM | POA: Diagnosis not present

## 2013-02-27 DIAGNOSIS — L97309 Non-pressure chronic ulcer of unspecified ankle with unspecified severity: Secondary | ICD-10-CM | POA: Diagnosis not present

## 2013-02-27 LAB — GLUCOSE, CAPILLARY
GLUCOSE-CAPILLARY: 183 mg/dL — AB (ref 70–99)
Glucose-Capillary: 178 mg/dL — ABNORMAL HIGH (ref 70–99)

## 2013-02-28 DIAGNOSIS — L97309 Non-pressure chronic ulcer of unspecified ankle with unspecified severity: Secondary | ICD-10-CM | POA: Diagnosis not present

## 2013-02-28 DIAGNOSIS — E1169 Type 2 diabetes mellitus with other specified complication: Secondary | ICD-10-CM | POA: Diagnosis not present

## 2013-02-28 LAB — GLUCOSE, CAPILLARY
GLUCOSE-CAPILLARY: 256 mg/dL — AB (ref 70–99)
Glucose-Capillary: 223 mg/dL — ABNORMAL HIGH (ref 70–99)

## 2013-03-03 DIAGNOSIS — L97309 Non-pressure chronic ulcer of unspecified ankle with unspecified severity: Secondary | ICD-10-CM | POA: Diagnosis not present

## 2013-03-03 DIAGNOSIS — E1169 Type 2 diabetes mellitus with other specified complication: Secondary | ICD-10-CM | POA: Diagnosis not present

## 2013-03-03 LAB — GLUCOSE, CAPILLARY
GLUCOSE-CAPILLARY: 220 mg/dL — AB (ref 70–99)
Glucose-Capillary: 179 mg/dL — ABNORMAL HIGH (ref 70–99)

## 2013-03-05 DIAGNOSIS — L97309 Non-pressure chronic ulcer of unspecified ankle with unspecified severity: Secondary | ICD-10-CM | POA: Diagnosis not present

## 2013-03-05 DIAGNOSIS — E1169 Type 2 diabetes mellitus with other specified complication: Secondary | ICD-10-CM | POA: Diagnosis not present

## 2013-03-05 LAB — GLUCOSE, CAPILLARY
Glucose-Capillary: 174 mg/dL — ABNORMAL HIGH (ref 70–99)
Glucose-Capillary: 180 mg/dL — ABNORMAL HIGH (ref 70–99)

## 2013-03-06 DIAGNOSIS — E1169 Type 2 diabetes mellitus with other specified complication: Secondary | ICD-10-CM | POA: Diagnosis not present

## 2013-03-06 DIAGNOSIS — L97309 Non-pressure chronic ulcer of unspecified ankle with unspecified severity: Secondary | ICD-10-CM | POA: Diagnosis not present

## 2013-03-06 LAB — GLUCOSE, CAPILLARY
GLUCOSE-CAPILLARY: 184 mg/dL — AB (ref 70–99)
Glucose-Capillary: 140 mg/dL — ABNORMAL HIGH (ref 70–99)

## 2013-03-07 DIAGNOSIS — L97309 Non-pressure chronic ulcer of unspecified ankle with unspecified severity: Secondary | ICD-10-CM | POA: Diagnosis not present

## 2013-03-07 DIAGNOSIS — E1169 Type 2 diabetes mellitus with other specified complication: Secondary | ICD-10-CM | POA: Diagnosis not present

## 2013-03-07 LAB — GLUCOSE, CAPILLARY
GLUCOSE-CAPILLARY: 184 mg/dL — AB (ref 70–99)
Glucose-Capillary: 149 mg/dL — ABNORMAL HIGH (ref 70–99)

## 2013-03-10 DIAGNOSIS — E1169 Type 2 diabetes mellitus with other specified complication: Secondary | ICD-10-CM | POA: Diagnosis not present

## 2013-03-10 DIAGNOSIS — L97309 Non-pressure chronic ulcer of unspecified ankle with unspecified severity: Secondary | ICD-10-CM | POA: Diagnosis not present

## 2013-03-10 LAB — GLUCOSE, CAPILLARY
Glucose-Capillary: 100 mg/dL — ABNORMAL HIGH (ref 70–99)
Glucose-Capillary: 99 mg/dL (ref 70–99)
Glucose-Capillary: 175 mg/dL — ABNORMAL HIGH (ref 70–99)

## 2013-03-11 DIAGNOSIS — L97309 Non-pressure chronic ulcer of unspecified ankle with unspecified severity: Secondary | ICD-10-CM | POA: Diagnosis not present

## 2013-03-11 DIAGNOSIS — E1169 Type 2 diabetes mellitus with other specified complication: Secondary | ICD-10-CM | POA: Diagnosis not present

## 2013-03-11 LAB — GLUCOSE, CAPILLARY
GLUCOSE-CAPILLARY: 146 mg/dL — AB (ref 70–99)
Glucose-Capillary: 131 mg/dL — ABNORMAL HIGH (ref 70–99)

## 2013-03-12 DIAGNOSIS — L97309 Non-pressure chronic ulcer of unspecified ankle with unspecified severity: Secondary | ICD-10-CM | POA: Diagnosis not present

## 2013-03-12 DIAGNOSIS — E1169 Type 2 diabetes mellitus with other specified complication: Secondary | ICD-10-CM | POA: Diagnosis not present

## 2013-03-12 LAB — GLUCOSE, CAPILLARY
GLUCOSE-CAPILLARY: 136 mg/dL — AB (ref 70–99)
GLUCOSE-CAPILLARY: 95 mg/dL (ref 70–99)

## 2013-03-17 ENCOUNTER — Encounter (HOSPITAL_BASED_OUTPATIENT_CLINIC_OR_DEPARTMENT_OTHER): Payer: Medicare Other | Attending: General Surgery

## 2013-03-17 DIAGNOSIS — L97309 Non-pressure chronic ulcer of unspecified ankle with unspecified severity: Secondary | ICD-10-CM | POA: Insufficient documentation

## 2013-03-17 DIAGNOSIS — E1169 Type 2 diabetes mellitus with other specified complication: Secondary | ICD-10-CM | POA: Insufficient documentation

## 2013-03-18 DIAGNOSIS — L97309 Non-pressure chronic ulcer of unspecified ankle with unspecified severity: Secondary | ICD-10-CM | POA: Diagnosis not present

## 2013-03-18 DIAGNOSIS — E1169 Type 2 diabetes mellitus with other specified complication: Secondary | ICD-10-CM | POA: Diagnosis not present

## 2013-03-18 LAB — GLUCOSE, CAPILLARY
GLUCOSE-CAPILLARY: 140 mg/dL — AB (ref 70–99)
GLUCOSE-CAPILLARY: 149 mg/dL — AB (ref 70–99)
Glucose-Capillary: 130 mg/dL — ABNORMAL HIGH (ref 70–99)

## 2013-03-19 DIAGNOSIS — E1169 Type 2 diabetes mellitus with other specified complication: Secondary | ICD-10-CM | POA: Diagnosis not present

## 2013-03-19 DIAGNOSIS — L97309 Non-pressure chronic ulcer of unspecified ankle with unspecified severity: Secondary | ICD-10-CM | POA: Diagnosis not present

## 2013-03-19 LAB — GLUCOSE, CAPILLARY
Glucose-Capillary: 196 mg/dL — ABNORMAL HIGH (ref 70–99)
Glucose-Capillary: 121 mg/dL — ABNORMAL HIGH (ref 70–99)

## 2013-03-20 DIAGNOSIS — E1169 Type 2 diabetes mellitus with other specified complication: Secondary | ICD-10-CM | POA: Diagnosis not present

## 2013-03-20 DIAGNOSIS — L97309 Non-pressure chronic ulcer of unspecified ankle with unspecified severity: Secondary | ICD-10-CM | POA: Diagnosis not present

## 2013-03-21 DIAGNOSIS — L97309 Non-pressure chronic ulcer of unspecified ankle with unspecified severity: Secondary | ICD-10-CM | POA: Diagnosis not present

## 2013-03-21 DIAGNOSIS — E1169 Type 2 diabetes mellitus with other specified complication: Secondary | ICD-10-CM | POA: Diagnosis not present

## 2013-03-21 LAB — GLUCOSE, CAPILLARY
Glucose-Capillary: 153 mg/dL — ABNORMAL HIGH (ref 70–99)
Glucose-Capillary: 161 mg/dL — ABNORMAL HIGH (ref 70–99)
Glucose-Capillary: 148 mg/dL — ABNORMAL HIGH (ref 70–99)
Glucose-Capillary: 181 mg/dL — ABNORMAL HIGH (ref 70–99)

## 2013-03-24 DIAGNOSIS — E1169 Type 2 diabetes mellitus with other specified complication: Secondary | ICD-10-CM | POA: Diagnosis not present

## 2013-03-24 DIAGNOSIS — L97309 Non-pressure chronic ulcer of unspecified ankle with unspecified severity: Secondary | ICD-10-CM | POA: Diagnosis not present

## 2013-03-24 LAB — GLUCOSE, CAPILLARY
Glucose-Capillary: 122 mg/dL — ABNORMAL HIGH (ref 70–99)
Glucose-Capillary: 152 mg/dL — ABNORMAL HIGH (ref 70–99)

## 2013-03-25 DIAGNOSIS — E1169 Type 2 diabetes mellitus with other specified complication: Secondary | ICD-10-CM | POA: Diagnosis not present

## 2013-03-25 DIAGNOSIS — L97309 Non-pressure chronic ulcer of unspecified ankle with unspecified severity: Secondary | ICD-10-CM | POA: Diagnosis not present

## 2013-03-26 DIAGNOSIS — E1169 Type 2 diabetes mellitus with other specified complication: Secondary | ICD-10-CM | POA: Diagnosis not present

## 2013-03-26 DIAGNOSIS — L97309 Non-pressure chronic ulcer of unspecified ankle with unspecified severity: Secondary | ICD-10-CM | POA: Diagnosis not present

## 2013-03-27 DIAGNOSIS — L97309 Non-pressure chronic ulcer of unspecified ankle with unspecified severity: Secondary | ICD-10-CM | POA: Diagnosis not present

## 2013-03-27 DIAGNOSIS — E1169 Type 2 diabetes mellitus with other specified complication: Secondary | ICD-10-CM | POA: Diagnosis not present

## 2013-03-28 DIAGNOSIS — E1169 Type 2 diabetes mellitus with other specified complication: Secondary | ICD-10-CM | POA: Diagnosis not present

## 2013-03-28 DIAGNOSIS — L97309 Non-pressure chronic ulcer of unspecified ankle with unspecified severity: Secondary | ICD-10-CM | POA: Diagnosis not present

## 2013-03-28 LAB — GLUCOSE, CAPILLARY
GLUCOSE-CAPILLARY: 154 mg/dL — AB (ref 70–99)
GLUCOSE-CAPILLARY: 179 mg/dL — AB (ref 70–99)
GLUCOSE-CAPILLARY: 151 mg/dL — AB (ref 70–99)
GLUCOSE-CAPILLARY: 123 mg/dL — AB (ref 70–99)
GLUCOSE-CAPILLARY: 146 mg/dL — AB (ref 70–99)
Glucose-Capillary: 131 mg/dL — ABNORMAL HIGH (ref 70–99)
Glucose-Capillary: 144 mg/dL — ABNORMAL HIGH (ref 70–99)
Glucose-Capillary: 134 mg/dL — ABNORMAL HIGH (ref 70–99)

## 2013-03-31 DIAGNOSIS — L97309 Non-pressure chronic ulcer of unspecified ankle with unspecified severity: Secondary | ICD-10-CM | POA: Diagnosis not present

## 2013-03-31 DIAGNOSIS — E1169 Type 2 diabetes mellitus with other specified complication: Secondary | ICD-10-CM | POA: Diagnosis not present

## 2013-03-31 LAB — GLUCOSE, CAPILLARY
Glucose-Capillary: 172 mg/dL — ABNORMAL HIGH (ref 70–99)
Glucose-Capillary: 133 mg/dL — ABNORMAL HIGH (ref 70–99)

## 2013-04-02 DIAGNOSIS — L97309 Non-pressure chronic ulcer of unspecified ankle with unspecified severity: Secondary | ICD-10-CM | POA: Diagnosis not present

## 2013-04-02 DIAGNOSIS — E1169 Type 2 diabetes mellitus with other specified complication: Secondary | ICD-10-CM | POA: Diagnosis not present

## 2013-04-02 LAB — GLUCOSE, CAPILLARY
GLUCOSE-CAPILLARY: 213 mg/dL — AB (ref 70–99)
GLUCOSE-CAPILLARY: 188 mg/dL — AB (ref 70–99)

## 2013-04-03 DIAGNOSIS — E1169 Type 2 diabetes mellitus with other specified complication: Secondary | ICD-10-CM | POA: Diagnosis not present

## 2013-04-03 DIAGNOSIS — L97309 Non-pressure chronic ulcer of unspecified ankle with unspecified severity: Secondary | ICD-10-CM | POA: Diagnosis not present

## 2013-04-04 DIAGNOSIS — L97309 Non-pressure chronic ulcer of unspecified ankle with unspecified severity: Secondary | ICD-10-CM | POA: Diagnosis not present

## 2013-04-04 DIAGNOSIS — E1169 Type 2 diabetes mellitus with other specified complication: Secondary | ICD-10-CM | POA: Diagnosis not present

## 2013-04-04 LAB — GLUCOSE, CAPILLARY
GLUCOSE-CAPILLARY: 171 mg/dL — AB (ref 70–99)
GLUCOSE-CAPILLARY: 199 mg/dL — AB (ref 70–99)
GLUCOSE-CAPILLARY: 149 mg/dL — AB (ref 70–99)
Glucose-Capillary: 188 mg/dL — ABNORMAL HIGH (ref 70–99)

## 2013-04-07 DIAGNOSIS — E1169 Type 2 diabetes mellitus with other specified complication: Secondary | ICD-10-CM | POA: Diagnosis not present

## 2013-04-07 DIAGNOSIS — L97309 Non-pressure chronic ulcer of unspecified ankle with unspecified severity: Secondary | ICD-10-CM | POA: Diagnosis not present

## 2013-04-07 LAB — GLUCOSE, CAPILLARY
Glucose-Capillary: 180 mg/dL — ABNORMAL HIGH (ref 70–99)
Glucose-Capillary: 163 mg/dL — ABNORMAL HIGH (ref 70–99)

## 2013-04-08 DIAGNOSIS — L97309 Non-pressure chronic ulcer of unspecified ankle with unspecified severity: Secondary | ICD-10-CM | POA: Diagnosis not present

## 2013-04-08 DIAGNOSIS — E1169 Type 2 diabetes mellitus with other specified complication: Secondary | ICD-10-CM | POA: Diagnosis not present

## 2013-04-08 LAB — GLUCOSE, CAPILLARY
Glucose-Capillary: 115 mg/dL — ABNORMAL HIGH (ref 70–99)
Glucose-Capillary: 149 mg/dL — ABNORMAL HIGH (ref 70–99)

## 2013-04-09 DIAGNOSIS — L97309 Non-pressure chronic ulcer of unspecified ankle with unspecified severity: Secondary | ICD-10-CM | POA: Diagnosis not present

## 2013-04-09 DIAGNOSIS — E1169 Type 2 diabetes mellitus with other specified complication: Secondary | ICD-10-CM | POA: Diagnosis not present

## 2013-04-09 LAB — GLUCOSE, CAPILLARY
GLUCOSE-CAPILLARY: 148 mg/dL — AB (ref 70–99)
Glucose-Capillary: 146 mg/dL — ABNORMAL HIGH (ref 70–99)

## 2013-04-10 DIAGNOSIS — E1169 Type 2 diabetes mellitus with other specified complication: Secondary | ICD-10-CM | POA: Diagnosis not present

## 2013-04-10 DIAGNOSIS — L97309 Non-pressure chronic ulcer of unspecified ankle with unspecified severity: Secondary | ICD-10-CM | POA: Diagnosis not present

## 2013-04-10 LAB — GLUCOSE, CAPILLARY
Glucose-Capillary: 121 mg/dL — ABNORMAL HIGH (ref 70–99)
Glucose-Capillary: 153 mg/dL — ABNORMAL HIGH (ref 70–99)

## 2013-04-11 DIAGNOSIS — L97309 Non-pressure chronic ulcer of unspecified ankle with unspecified severity: Secondary | ICD-10-CM | POA: Diagnosis not present

## 2013-04-11 DIAGNOSIS — E1169 Type 2 diabetes mellitus with other specified complication: Secondary | ICD-10-CM | POA: Diagnosis not present

## 2013-04-11 LAB — GLUCOSE, CAPILLARY
Glucose-Capillary: 164 mg/dL — ABNORMAL HIGH (ref 70–99)
Glucose-Capillary: 106 mg/dL — ABNORMAL HIGH (ref 70–99)

## 2013-04-15 DIAGNOSIS — E1169 Type 2 diabetes mellitus with other specified complication: Secondary | ICD-10-CM | POA: Diagnosis not present

## 2013-04-15 DIAGNOSIS — L97309 Non-pressure chronic ulcer of unspecified ankle with unspecified severity: Secondary | ICD-10-CM | POA: Diagnosis not present

## 2013-04-15 LAB — GLUCOSE, CAPILLARY
Glucose-Capillary: 148 mg/dL — ABNORMAL HIGH (ref 70–99)
Glucose-Capillary: 152 mg/dL — ABNORMAL HIGH (ref 70–99)

## 2013-04-16 ENCOUNTER — Encounter (HOSPITAL_BASED_OUTPATIENT_CLINIC_OR_DEPARTMENT_OTHER): Payer: Medicare Other | Attending: General Surgery

## 2013-04-16 DIAGNOSIS — L97309 Non-pressure chronic ulcer of unspecified ankle with unspecified severity: Secondary | ICD-10-CM | POA: Insufficient documentation

## 2013-04-16 DIAGNOSIS — E1169 Type 2 diabetes mellitus with other specified complication: Secondary | ICD-10-CM | POA: Diagnosis not present

## 2013-04-16 LAB — GLUCOSE, CAPILLARY
GLUCOSE-CAPILLARY: 130 mg/dL — AB (ref 70–99)
GLUCOSE-CAPILLARY: 138 mg/dL — AB (ref 70–99)

## 2013-04-17 DIAGNOSIS — E1169 Type 2 diabetes mellitus with other specified complication: Secondary | ICD-10-CM | POA: Diagnosis not present

## 2013-04-17 DIAGNOSIS — L97309 Non-pressure chronic ulcer of unspecified ankle with unspecified severity: Secondary | ICD-10-CM | POA: Diagnosis not present

## 2013-04-17 LAB — GLUCOSE, CAPILLARY
Glucose-Capillary: 165 mg/dL — ABNORMAL HIGH (ref 70–99)
Glucose-Capillary: 130 mg/dL — ABNORMAL HIGH (ref 70–99)

## 2013-04-18 DIAGNOSIS — L97309 Non-pressure chronic ulcer of unspecified ankle with unspecified severity: Secondary | ICD-10-CM | POA: Diagnosis not present

## 2013-04-18 LAB — GLUCOSE, CAPILLARY
GLUCOSE-CAPILLARY: 247 mg/dL — AB (ref 70–99)
Glucose-Capillary: 195 mg/dL — ABNORMAL HIGH (ref 70–99)

## 2013-04-21 DIAGNOSIS — E785 Hyperlipidemia, unspecified: Secondary | ICD-10-CM | POA: Diagnosis not present

## 2013-04-21 DIAGNOSIS — Z125 Encounter for screening for malignant neoplasm of prostate: Secondary | ICD-10-CM | POA: Diagnosis not present

## 2013-04-21 DIAGNOSIS — E119 Type 2 diabetes mellitus without complications: Secondary | ICD-10-CM | POA: Diagnosis not present

## 2013-04-21 DIAGNOSIS — L97309 Non-pressure chronic ulcer of unspecified ankle with unspecified severity: Secondary | ICD-10-CM | POA: Diagnosis not present

## 2013-04-21 LAB — GLUCOSE, CAPILLARY
Glucose-Capillary: 223 mg/dL — ABNORMAL HIGH (ref 70–99)
Glucose-Capillary: 160 mg/dL — ABNORMAL HIGH (ref 70–99)

## 2013-04-22 DIAGNOSIS — L97309 Non-pressure chronic ulcer of unspecified ankle with unspecified severity: Secondary | ICD-10-CM | POA: Diagnosis not present

## 2013-04-22 LAB — GLUCOSE, CAPILLARY
GLUCOSE-CAPILLARY: 158 mg/dL — AB (ref 70–99)
GLUCOSE-CAPILLARY: 129 mg/dL — AB (ref 70–99)

## 2013-04-24 DIAGNOSIS — L97309 Non-pressure chronic ulcer of unspecified ankle with unspecified severity: Secondary | ICD-10-CM | POA: Diagnosis not present

## 2013-04-24 LAB — GLUCOSE, CAPILLARY
GLUCOSE-CAPILLARY: 158 mg/dL — AB (ref 70–99)
Glucose-Capillary: 175 mg/dL — ABNORMAL HIGH (ref 70–99)

## 2013-04-25 DIAGNOSIS — L97309 Non-pressure chronic ulcer of unspecified ankle with unspecified severity: Secondary | ICD-10-CM | POA: Diagnosis not present

## 2013-04-25 LAB — GLUCOSE, CAPILLARY: GLUCOSE-CAPILLARY: 130 mg/dL — AB (ref 70–99)

## 2013-04-28 DIAGNOSIS — I739 Peripheral vascular disease, unspecified: Secondary | ICD-10-CM | POA: Diagnosis not present

## 2013-04-28 DIAGNOSIS — I10 Essential (primary) hypertension: Secondary | ICD-10-CM | POA: Diagnosis not present

## 2013-04-28 DIAGNOSIS — Z79899 Other long term (current) drug therapy: Secondary | ICD-10-CM | POA: Diagnosis not present

## 2013-04-28 DIAGNOSIS — D649 Anemia, unspecified: Secondary | ICD-10-CM | POA: Diagnosis not present

## 2013-04-28 DIAGNOSIS — R609 Edema, unspecified: Secondary | ICD-10-CM | POA: Diagnosis not present

## 2013-04-28 DIAGNOSIS — Z0000 Encounter for general adult medical examination without abnormal findings: Secondary | ICD-10-CM | POA: Diagnosis not present

## 2013-04-28 DIAGNOSIS — N183 Chronic kidney disease, stage 3 unspecified: Secondary | ICD-10-CM | POA: Diagnosis not present

## 2013-04-28 DIAGNOSIS — E785 Hyperlipidemia, unspecified: Secondary | ICD-10-CM | POA: Diagnosis not present

## 2013-04-28 DIAGNOSIS — E1159 Type 2 diabetes mellitus with other circulatory complications: Secondary | ICD-10-CM | POA: Diagnosis not present

## 2013-04-28 DIAGNOSIS — L97909 Non-pressure chronic ulcer of unspecified part of unspecified lower leg with unspecified severity: Secondary | ICD-10-CM | POA: Diagnosis not present

## 2013-04-28 LAB — GLUCOSE, CAPILLARY: Glucose-Capillary: 214 mg/dL — ABNORMAL HIGH (ref 70–99)

## 2013-04-29 ENCOUNTER — Other Ambulatory Visit (HOSPITAL_COMMUNITY): Payer: Medicare Other

## 2013-04-29 DIAGNOSIS — Z1212 Encounter for screening for malignant neoplasm of rectum: Secondary | ICD-10-CM | POA: Diagnosis not present

## 2013-04-29 DIAGNOSIS — L97309 Non-pressure chronic ulcer of unspecified ankle with unspecified severity: Secondary | ICD-10-CM | POA: Diagnosis not present

## 2013-04-29 LAB — GLUCOSE, CAPILLARY
GLUCOSE-CAPILLARY: 132 mg/dL — AB (ref 70–99)
Glucose-Capillary: 217 mg/dL — ABNORMAL HIGH (ref 70–99)

## 2013-04-30 DIAGNOSIS — F17200 Nicotine dependence, unspecified, uncomplicated: Secondary | ICD-10-CM | POA: Diagnosis not present

## 2013-04-30 DIAGNOSIS — L97209 Non-pressure chronic ulcer of unspecified calf with unspecified severity: Secondary | ICD-10-CM | POA: Diagnosis not present

## 2013-04-30 DIAGNOSIS — E1159 Type 2 diabetes mellitus with other circulatory complications: Secondary | ICD-10-CM | POA: Diagnosis not present

## 2013-04-30 DIAGNOSIS — I10 Essential (primary) hypertension: Secondary | ICD-10-CM | POA: Diagnosis not present

## 2013-04-30 DIAGNOSIS — Z794 Long term (current) use of insulin: Secondary | ICD-10-CM | POA: Diagnosis not present

## 2013-04-30 DIAGNOSIS — E1149 Type 2 diabetes mellitus with other diabetic neurological complication: Secondary | ICD-10-CM | POA: Diagnosis not present

## 2013-04-30 DIAGNOSIS — Z7982 Long term (current) use of aspirin: Secondary | ICD-10-CM | POA: Diagnosis not present

## 2013-04-30 DIAGNOSIS — I8390 Asymptomatic varicose veins of unspecified lower extremity: Secondary | ICD-10-CM | POA: Diagnosis not present

## 2013-05-01 DIAGNOSIS — L97309 Non-pressure chronic ulcer of unspecified ankle with unspecified severity: Secondary | ICD-10-CM | POA: Diagnosis not present

## 2013-05-01 LAB — GLUCOSE, CAPILLARY
Glucose-Capillary: 194 mg/dL — ABNORMAL HIGH (ref 70–99)
Glucose-Capillary: 160 mg/dL — ABNORMAL HIGH (ref 70–99)

## 2013-05-02 ENCOUNTER — Other Ambulatory Visit (HOSPITAL_COMMUNITY): Payer: Medicare Other

## 2013-05-05 ENCOUNTER — Other Ambulatory Visit (HOSPITAL_COMMUNITY): Payer: Medicare Other

## 2013-05-05 DIAGNOSIS — L97309 Non-pressure chronic ulcer of unspecified ankle with unspecified severity: Secondary | ICD-10-CM | POA: Diagnosis not present

## 2013-05-05 LAB — GLUCOSE, CAPILLARY
GLUCOSE-CAPILLARY: 198 mg/dL — AB (ref 70–99)
Glucose-Capillary: 168 mg/dL — ABNORMAL HIGH (ref 70–99)

## 2013-05-06 ENCOUNTER — Ambulatory Visit (HOSPITAL_COMMUNITY)
Admission: RE | Admit: 2013-05-06 | Discharge: 2013-05-06 | Disposition: A | Discharge status: Home / Self Care | Payer: Medicare Other | Source: Ambulatory Visit | Attending: Vascular Surgery | Admitting: Vascular Surgery

## 2013-05-06 ENCOUNTER — Encounter (INDEPENDENT_AMBULATORY_CARE_PROVIDER_SITE_OTHER): Payer: Self-pay

## 2013-05-06 ENCOUNTER — Other Ambulatory Visit (HOSPITAL_COMMUNITY): Payer: Self-pay | Admitting: Internal Medicine

## 2013-05-06 DIAGNOSIS — R600 Localized edema: Secondary | ICD-10-CM

## 2013-05-06 DIAGNOSIS — R609 Edema, unspecified: Secondary | ICD-10-CM

## 2013-05-06 DIAGNOSIS — L97309 Non-pressure chronic ulcer of unspecified ankle with unspecified severity: Secondary | ICD-10-CM | POA: Diagnosis not present

## 2013-05-07 DIAGNOSIS — L97309 Non-pressure chronic ulcer of unspecified ankle with unspecified severity: Secondary | ICD-10-CM | POA: Diagnosis not present

## 2013-05-09 DIAGNOSIS — E1165 Type 2 diabetes mellitus with hyperglycemia: Secondary | ICD-10-CM | POA: Diagnosis not present

## 2013-05-09 DIAGNOSIS — L97909 Non-pressure chronic ulcer of unspecified part of unspecified lower leg with unspecified severity: Secondary | ICD-10-CM | POA: Diagnosis not present

## 2013-05-09 DIAGNOSIS — IMO0002 Reserved for concepts with insufficient information to code with codable children: Secondary | ICD-10-CM | POA: Diagnosis not present

## 2013-05-09 DIAGNOSIS — L97309 Non-pressure chronic ulcer of unspecified ankle with unspecified severity: Secondary | ICD-10-CM | POA: Diagnosis not present

## 2013-05-12 DIAGNOSIS — L97309 Non-pressure chronic ulcer of unspecified ankle with unspecified severity: Secondary | ICD-10-CM | POA: Diagnosis not present

## 2013-05-13 DIAGNOSIS — L97309 Non-pressure chronic ulcer of unspecified ankle with unspecified severity: Secondary | ICD-10-CM | POA: Diagnosis not present

## 2013-05-15 DIAGNOSIS — L97309 Non-pressure chronic ulcer of unspecified ankle with unspecified severity: Secondary | ICD-10-CM | POA: Diagnosis not present

## 2013-05-16 ENCOUNTER — Encounter (HOSPITAL_BASED_OUTPATIENT_CLINIC_OR_DEPARTMENT_OTHER): Payer: Medicare Other | Attending: General Surgery

## 2013-05-16 DIAGNOSIS — I872 Venous insufficiency (chronic) (peripheral): Secondary | ICD-10-CM | POA: Diagnosis not present

## 2013-05-16 DIAGNOSIS — L97809 Non-pressure chronic ulcer of other part of unspecified lower leg with unspecified severity: Secondary | ICD-10-CM | POA: Insufficient documentation

## 2013-05-19 LAB — GLUCOSE, CAPILLARY: Glucose-Capillary: 170 mg/dL — ABNORMAL HIGH (ref 70–99)

## 2013-05-23 DIAGNOSIS — L97809 Non-pressure chronic ulcer of other part of unspecified lower leg with unspecified severity: Secondary | ICD-10-CM | POA: Diagnosis not present

## 2013-05-23 DIAGNOSIS — I872 Venous insufficiency (chronic) (peripheral): Secondary | ICD-10-CM | POA: Diagnosis not present

## 2013-05-30 DIAGNOSIS — L97809 Non-pressure chronic ulcer of other part of unspecified lower leg with unspecified severity: Secondary | ICD-10-CM | POA: Diagnosis not present

## 2013-05-30 DIAGNOSIS — I872 Venous insufficiency (chronic) (peripheral): Secondary | ICD-10-CM | POA: Diagnosis not present

## 2013-06-06 DIAGNOSIS — L97809 Non-pressure chronic ulcer of other part of unspecified lower leg with unspecified severity: Secondary | ICD-10-CM | POA: Diagnosis not present

## 2013-06-06 DIAGNOSIS — I872 Venous insufficiency (chronic) (peripheral): Secondary | ICD-10-CM | POA: Diagnosis not present

## 2013-06-13 DIAGNOSIS — I872 Venous insufficiency (chronic) (peripheral): Secondary | ICD-10-CM | POA: Diagnosis not present

## 2013-06-13 DIAGNOSIS — L97809 Non-pressure chronic ulcer of other part of unspecified lower leg with unspecified severity: Secondary | ICD-10-CM | POA: Diagnosis not present

## 2013-06-20 ENCOUNTER — Encounter (HOSPITAL_BASED_OUTPATIENT_CLINIC_OR_DEPARTMENT_OTHER): Payer: Medicare Other | Attending: General Surgery

## 2013-06-20 DIAGNOSIS — L97309 Non-pressure chronic ulcer of unspecified ankle with unspecified severity: Secondary | ICD-10-CM | POA: Diagnosis not present

## 2013-06-23 DIAGNOSIS — L97309 Non-pressure chronic ulcer of unspecified ankle with unspecified severity: Secondary | ICD-10-CM | POA: Diagnosis not present

## 2013-06-27 DIAGNOSIS — L97309 Non-pressure chronic ulcer of unspecified ankle with unspecified severity: Secondary | ICD-10-CM | POA: Diagnosis not present

## 2013-07-04 DIAGNOSIS — L97309 Non-pressure chronic ulcer of unspecified ankle with unspecified severity: Secondary | ICD-10-CM | POA: Diagnosis not present

## 2013-07-11 DIAGNOSIS — L97309 Non-pressure chronic ulcer of unspecified ankle with unspecified severity: Secondary | ICD-10-CM | POA: Diagnosis not present

## 2013-07-21 ENCOUNTER — Encounter (HOSPITAL_BASED_OUTPATIENT_CLINIC_OR_DEPARTMENT_OTHER): Payer: Medicare Other | Attending: General Surgery

## 2013-07-21 DIAGNOSIS — S91009A Unspecified open wound, unspecified ankle, initial encounter: Principal | ICD-10-CM

## 2013-07-21 DIAGNOSIS — X58XXXA Exposure to other specified factors, initial encounter: Secondary | ICD-10-CM | POA: Insufficient documentation

## 2013-07-21 DIAGNOSIS — S81809A Unspecified open wound, unspecified lower leg, initial encounter: Secondary | ICD-10-CM | POA: Diagnosis not present

## 2013-07-21 DIAGNOSIS — S81009A Unspecified open wound, unspecified knee, initial encounter: Secondary | ICD-10-CM | POA: Insufficient documentation

## 2013-07-26 ENCOUNTER — Telehealth: Payer: Self-pay | Admitting: Cardiovascular Disease

## 2013-07-26 NOTE — Telephone Encounter (Signed)
Closed encounter °

## 2013-07-28 ENCOUNTER — Encounter (HOSPITAL_BASED_OUTPATIENT_CLINIC_OR_DEPARTMENT_OTHER): Payer: Self-pay | Admitting: *Deleted

## 2013-07-28 DIAGNOSIS — S81809A Unspecified open wound, unspecified lower leg, initial encounter: Secondary | ICD-10-CM | POA: Diagnosis not present

## 2013-07-28 DIAGNOSIS — S81009A Unspecified open wound, unspecified knee, initial encounter: Secondary | ICD-10-CM | POA: Diagnosis not present

## 2013-07-29 NOTE — Progress Notes (Signed)
[facility]  NAME:  [name], [name]                  ACCOUNT NO.:  [id number]  MEDICAL RECORD NO.:  [id number]      DATE OF BIRTH:  1955/05/20  PHYSICIAN:  Irene Limbo, MD    VISIT DATE:  07/28/2013                                  OFFICE VISIT   The patient is here for followup of left medial ankle wound.  The patient has undergone multiple modalities including Apligraf and has had minimal improvement.  Review of chart indicates he has had normal venous ultrasound.  ABI in clinic is calculated as 1.10.  He has not had any vascular referral for arterial disease.  He has been evaluated by Dr. Migdalia Dk who discussed surgical debridement with placement of ACell.  I offered to complete this procedure for him this week as Dr. Migdalia Dk is out of town.  The patient has another appointment with Dr. Gwenlyn Found this week and has transportation concerns. He would like to defer any surgical intervention until after he returns from his vacation at the end of the month.  PHYSICAL EXAMINATION:  Blood pressure is 145/80, pulse is 60, blood glucose is  121, temperature is 98.  After application of topical anesthetic, curette was used to remove the superficial slough.  Overall, the patient's wound is very woody in nature and anticipate he will benefit from complete surgical excision of the ulcer.  Wound size measured as 2 x 4.9 x 0.1 cm.  This is unchanged largely from his last visit here.  We will plan to treat with silver collagen and foam dressing, and he will followup in 1 week's time.          ______________________________ Irene Limbo, MD     BT/MEDQ  D:  07/28/2013  T:  07/29/2013  Job:  [id number]

## 2013-08-01 ENCOUNTER — Encounter (HOSPITAL_BASED_OUTPATIENT_CLINIC_OR_DEPARTMENT_OTHER): Admission: RE | Payer: Self-pay | Source: Ambulatory Visit

## 2013-08-01 ENCOUNTER — Ambulatory Visit (HOSPITAL_BASED_OUTPATIENT_CLINIC_OR_DEPARTMENT_OTHER): Admission: RE | Admit: 2013-08-01 | Payer: Medicare Other | Source: Ambulatory Visit | Admitting: Plastic Surgery

## 2013-08-01 ENCOUNTER — Ambulatory Visit (INDEPENDENT_AMBULATORY_CARE_PROVIDER_SITE_OTHER): Payer: Medicare Other | Admitting: Cardiovascular Disease

## 2013-08-01 ENCOUNTER — Encounter: Payer: Self-pay | Admitting: Cardiovascular Disease

## 2013-08-01 VITALS — BP 160/71 | HR 60 | Ht 70.0 in | Wt 180.7 lb

## 2013-08-01 DIAGNOSIS — L97309 Non-pressure chronic ulcer of unspecified ankle with unspecified severity: Secondary | ICD-10-CM

## 2013-08-01 DIAGNOSIS — E785 Hyperlipidemia, unspecified: Secondary | ICD-10-CM

## 2013-08-01 DIAGNOSIS — S91302A Unspecified open wound, left foot, initial encounter: Secondary | ICD-10-CM

## 2013-08-01 DIAGNOSIS — I10 Essential (primary) hypertension: Secondary | ICD-10-CM | POA: Diagnosis not present

## 2013-08-01 DIAGNOSIS — L97329 Non-pressure chronic ulcer of left ankle with unspecified severity: Secondary | ICD-10-CM | POA: Insufficient documentation

## 2013-08-01 HISTORY — DX: Type 2 diabetes mellitus with diabetic polyneuropathy: E11.42

## 2013-08-01 HISTORY — DX: Opioid use, unspecified, uncomplicated: F11.90

## 2013-08-01 HISTORY — DX: Type 1 diabetes mellitus without complications: E10.9

## 2013-08-01 HISTORY — DX: Non-pressure chronic ulcer of left ankle with unspecified severity: L97.329

## 2013-08-01 SURGERY — IRRIGATION AND DEBRIDEMENT EXTREMITY
Anesthesia: General | Laterality: Left

## 2013-08-01 NOTE — Assessment & Plan Note (Signed)
The patient was referred to me by Dr. Judene Companion at the [facility] wound care center for peripheral vasodilation because of a nonhealing left medial malleolus ulcer. This has been on for 6 months. He has had 2 courses of hyperbaric therapy. He does have a history of bilateral leg from about 40 years ago surgery to his left leg 5 years ago. His vascular risk factors are notable for hypertension, diabetes and hyperlipidemia. His left leg clearly is somewhat atrophied and smaller than his right leg. I'm going to get lower extremity arterial Doppler studies to assess his circulation prior to making any further recommendations.

## 2013-08-01 NOTE — Assessment & Plan Note (Signed)
Controlled on current medications 

## 2013-08-01 NOTE — Assessment & Plan Note (Signed)
On statin therapy followed by his PCP 
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
## 2013-08-01 NOTE — Patient Instructions (Signed)
  We will see you back in follow up only as needed.   Dr Gwenlyn Found has ordered: lower extremity arterial doppler- During this test, ultrasound is used to evaluate arterial blood flow in the legs. Allow approximately one hour for this exam.

## 2013-08-04 DIAGNOSIS — S81009A Unspecified open wound, unspecified knee, initial encounter: Secondary | ICD-10-CM | POA: Diagnosis not present

## 2013-08-04 DIAGNOSIS — S81809A Unspecified open wound, unspecified lower leg, initial encounter: Secondary | ICD-10-CM | POA: Diagnosis not present

## 2013-08-05 ENCOUNTER — Telehealth (HOSPITAL_COMMUNITY): Payer: Self-pay | Admitting: *Deleted

## 2013-08-12 ENCOUNTER — Encounter (HOSPITAL_COMMUNITY): Payer: Medicare Other

## 2013-08-18 ENCOUNTER — Encounter (HOSPITAL_BASED_OUTPATIENT_CLINIC_OR_DEPARTMENT_OTHER): Payer: Medicare Other | Attending: Plastic Surgery

## 2013-08-18 DIAGNOSIS — E1169 Type 2 diabetes mellitus with other specified complication: Secondary | ICD-10-CM | POA: Insufficient documentation

## 2013-08-18 DIAGNOSIS — L97309 Non-pressure chronic ulcer of unspecified ankle with unspecified severity: Secondary | ICD-10-CM | POA: Insufficient documentation

## 2013-08-18 DIAGNOSIS — I872 Venous insufficiency (chronic) (peripheral): Secondary | ICD-10-CM | POA: Diagnosis not present

## 2013-08-18 NOTE — Progress Notes (Signed)
[facility]  NAME:  [name], [name]                  ACCOUNT NO.:  [id number]  MEDICAL RECORD NO.:  [id number]      DATE OF BIRTH:  25-Sep-1955  PHYSICIAN:  Theodoro Kos, DO       VISIT DATE:  08/18/2013                                  OFFICE VISIT   HISTORY OF PRESENT ILLNESS:  The patient is a 58 year old male who is here for followup on his left lower extremity ankle ulcer.  He has chronic venous insufficiency and diabetes.  He has been using collagen on the area over the past week and changes it daily after his showers. He notes a little bit of redness but overall, the area does seem to be improving.  He has not had a Vascular consult yet but it is due this week.  There has been no change in his medications or social history.  REVIEW OF SYSTEMS:  Negative.  PHYSICAL EXAMINATION:  GENERAL:  He is alert, oriented, cooperative, not in any acute distress.  He is very pleasant. HEENT:  His pupils are equal. LUNGS:  His breathing is unlabored. CARDIOVASCULAR:  His heart rate is regular. NECK:  He does not have any cervical lymphadenopathy.  He has a little bit of periwound redness and tight skin.  He also has a little bit of redness on the right leg.  No sign of cellulitis.  I used a curette for debriding a little bit of the hypergranulation and some of the fibrous tissue.  RECOMMENDATION:  To switch to [facility] for this week, elevation, follow up with the Vascular consult, and we will see him back in 1 week.     Theodoro Kos, DO     CS/MEDQ  D:  08/18/2013  T:  08/18/2013  Job:  [id number]

## 2013-08-19 ENCOUNTER — Ambulatory Visit (HOSPITAL_COMMUNITY)
Admission: RE | Admit: 2013-08-19 | Discharge: 2013-08-19 | Disposition: A | Discharge status: Home / Self Care | Payer: Medicare Other | Source: Ambulatory Visit | Attending: Cardiovascular Disease | Admitting: Cardiovascular Disease

## 2013-08-19 DIAGNOSIS — L97509 Non-pressure chronic ulcer of other part of unspecified foot with unspecified severity: Secondary | ICD-10-CM

## 2013-08-19 DIAGNOSIS — Y929 Unspecified place or not applicable: Secondary | ICD-10-CM | POA: Insufficient documentation

## 2013-08-19 DIAGNOSIS — S91309A Unspecified open wound, unspecified foot, initial encounter: Secondary | ICD-10-CM | POA: Diagnosis not present

## 2013-08-19 DIAGNOSIS — S91302A Unspecified open wound, left foot, initial encounter: Secondary | ICD-10-CM

## 2013-08-19 DIAGNOSIS — X58XXXA Exposure to other specified factors, initial encounter: Secondary | ICD-10-CM | POA: Insufficient documentation

## 2013-08-19 NOTE — Progress Notes (Signed)
Lower Extremity Arterial Duplex Completed. °Brianna L Mazza,RVT °

## 2013-09-02 ENCOUNTER — Encounter: Payer: Self-pay | Admitting: *Deleted

## 2013-09-04 DIAGNOSIS — M545 Low back pain, unspecified: Secondary | ICD-10-CM | POA: Diagnosis not present

## 2013-09-04 DIAGNOSIS — E1159 Type 2 diabetes mellitus with other circulatory complications: Secondary | ICD-10-CM | POA: Diagnosis not present

## 2013-09-04 DIAGNOSIS — I739 Peripheral vascular disease, unspecified: Secondary | ICD-10-CM | POA: Diagnosis not present

## 2013-09-04 DIAGNOSIS — Z6825 Body mass index (BMI) 25.0-25.9, adult: Secondary | ICD-10-CM | POA: Diagnosis not present

## 2013-09-04 DIAGNOSIS — L97909 Non-pressure chronic ulcer of unspecified part of unspecified lower leg with unspecified severity: Secondary | ICD-10-CM | POA: Diagnosis not present

## 2013-09-04 DIAGNOSIS — I10 Essential (primary) hypertension: Secondary | ICD-10-CM | POA: Diagnosis not present

## 2013-09-08 DIAGNOSIS — L97309 Non-pressure chronic ulcer of unspecified ankle with unspecified severity: Secondary | ICD-10-CM | POA: Diagnosis not present

## 2013-09-08 DIAGNOSIS — E1169 Type 2 diabetes mellitus with other specified complication: Secondary | ICD-10-CM | POA: Diagnosis not present

## 2013-09-08 DIAGNOSIS — I872 Venous insufficiency (chronic) (peripheral): Secondary | ICD-10-CM | POA: Diagnosis not present

## 2013-09-08 NOTE — Progress Notes (Signed)
[facility]  NAME:  Duvall, [facility]                       ACCOUNT NO.:  MEDICAL RECORD NO.:  [id number]      DATE OF BIRTH:  [birth year]  PHYSICIAN:  Theodoro Kos, DO       VISIT DATE:  09/08/2013                                  OFFICE VISIT   The patient is a 58 year old male who is here for followup on his left medial ankle ulcer.  He has been using Santyl, does not appeared to be infected, shows a little bit of improvement, but he has a real thick fibrous layer.  I tried to curette it which helped a little bit, but he is going to need formal debridement to get beyond that.  There is no change in his medication or social history.  On exam, he is alert and oriented, cooperative, not in any distress.  He is very pleasant.  He has hearing aids in, but he is able to communicate well.  His breathing is unlabored.  His pulse is positive.  He has a little bit of maceration around the periwound area.  It does not appear to be infected.  Debridement was done.  We will continue with Santyl and work him up for the OR for irrigation and debridement and ACell placement.     Theodoro Kos, DO     CS/MEDQ  D:  09/08/2013  T:  09/08/2013  Job:  [id number]

## 2013-09-09 DIAGNOSIS — F17200 Nicotine dependence, unspecified, uncomplicated: Secondary | ICD-10-CM | POA: Diagnosis not present

## 2013-09-09 DIAGNOSIS — E1149 Type 2 diabetes mellitus with other diabetic neurological complication: Secondary | ICD-10-CM | POA: Diagnosis not present

## 2013-09-09 DIAGNOSIS — L97309 Non-pressure chronic ulcer of unspecified ankle with unspecified severity: Secondary | ICD-10-CM | POA: Diagnosis not present

## 2013-09-15 ENCOUNTER — Encounter (HOSPITAL_COMMUNITY): Admission: RE | Payer: Self-pay | Source: Ambulatory Visit

## 2013-09-15 ENCOUNTER — Ambulatory Visit (HOSPITAL_COMMUNITY): Admission: RE | Admit: 2013-09-15 | Payer: Medicare Other | Source: Ambulatory Visit | Admitting: Plastic Surgery

## 2013-09-15 DIAGNOSIS — E119 Type 2 diabetes mellitus without complications: Secondary | ICD-10-CM | POA: Diagnosis not present

## 2013-09-15 SURGERY — IRRIGATION AND DEBRIDEMENT EXTREMITY
Anesthesia: General | Site: Ankle | Laterality: Left

## 2013-09-17 DIAGNOSIS — L97309 Non-pressure chronic ulcer of unspecified ankle with unspecified severity: Secondary | ICD-10-CM | POA: Diagnosis not present

## 2013-09-17 DIAGNOSIS — E1149 Type 2 diabetes mellitus with other diabetic neurological complication: Secondary | ICD-10-CM | POA: Diagnosis not present

## 2013-09-17 DIAGNOSIS — I83219 Varicose veins of right lower extremity with both ulcer of unspecified site and inflammation: Secondary | ICD-10-CM | POA: Diagnosis not present

## 2013-09-17 DIAGNOSIS — F17200 Nicotine dependence, unspecified, uncomplicated: Secondary | ICD-10-CM | POA: Diagnosis not present

## 2013-09-17 DIAGNOSIS — L97929 Non-pressure chronic ulcer of unspecified part of left lower leg with unspecified severity: Secondary | ICD-10-CM | POA: Diagnosis not present

## 2013-10-21 DIAGNOSIS — E1142 Type 2 diabetes mellitus with diabetic polyneuropathy: Secondary | ICD-10-CM | POA: Diagnosis not present

## 2013-10-21 DIAGNOSIS — L97321 Non-pressure chronic ulcer of left ankle limited to breakdown of skin: Secondary | ICD-10-CM | POA: Diagnosis not present

## 2013-11-18 DIAGNOSIS — L97321 Non-pressure chronic ulcer of left ankle limited to breakdown of skin: Secondary | ICD-10-CM | POA: Diagnosis not present

## 2013-11-18 DIAGNOSIS — E1142 Type 2 diabetes mellitus with diabetic polyneuropathy: Secondary | ICD-10-CM | POA: Diagnosis not present

## 2013-12-05 DIAGNOSIS — I739 Peripheral vascular disease, unspecified: Secondary | ICD-10-CM | POA: Diagnosis not present

## 2013-12-05 DIAGNOSIS — E1151 Type 2 diabetes mellitus with diabetic peripheral angiopathy without gangrene: Secondary | ICD-10-CM | POA: Diagnosis not present

## 2013-12-05 DIAGNOSIS — Z6825 Body mass index (BMI) 25.0-25.9, adult: Secondary | ICD-10-CM | POA: Diagnosis not present

## 2013-12-05 DIAGNOSIS — I10 Essential (primary) hypertension: Secondary | ICD-10-CM | POA: Diagnosis not present

## 2013-12-05 DIAGNOSIS — M545 Low back pain: Secondary | ICD-10-CM | POA: Diagnosis not present

## 2013-12-16 DIAGNOSIS — E1142 Type 2 diabetes mellitus with diabetic polyneuropathy: Secondary | ICD-10-CM | POA: Diagnosis not present

## 2013-12-16 DIAGNOSIS — L97321 Non-pressure chronic ulcer of left ankle limited to breakdown of skin: Secondary | ICD-10-CM | POA: Diagnosis not present

## 2013-12-18 ENCOUNTER — Ambulatory Visit (INDEPENDENT_AMBULATORY_CARE_PROVIDER_SITE_OTHER): Payer: Medicare Other | Admitting: Podiatry

## 2013-12-18 ENCOUNTER — Ambulatory Visit (INDEPENDENT_AMBULATORY_CARE_PROVIDER_SITE_OTHER): Payer: Medicare Other

## 2013-12-18 VITALS — BP 116/80 | HR 88 | Resp 16

## 2013-12-18 DIAGNOSIS — S9032XA Contusion of left foot, initial encounter: Secondary | ICD-10-CM | POA: Diagnosis not present

## 2013-12-18 DIAGNOSIS — B351 Tinea unguium: Secondary | ICD-10-CM | POA: Diagnosis not present

## 2013-12-18 DIAGNOSIS — M79676 Pain in unspecified toe(s): Secondary | ICD-10-CM

## 2013-12-18 NOTE — Progress Notes (Signed)
He presents today after having fallen 3 weeks ago which resulted in a sharp dorsiflexion motion of his toes. He states that his toenails have been tender to those 3 toes of his left foot since he fell. He is also complaining of painful toenails bilateral and painful calluses.  Objective: Vital signs are stable he is alert and oriented 3. Pulses are palpable bilateral. Nails are thick yellow dystrophic onychomycotic and painful palpation as well as debridement. Radiographic evaluation does not demonstrate any type of osseus abnormalities the screws are retained to the toes in question.  Assessment: Pain and limp secondary to onychomycosis and contusion foot.  Plan: Debridement of nails 1 through 5 bilateral covers her secondary pain follow-up with me as needed.

## 2014-03-11 DIAGNOSIS — I10 Essential (primary) hypertension: Secondary | ICD-10-CM | POA: Diagnosis not present

## 2014-03-11 DIAGNOSIS — E1151 Type 2 diabetes mellitus with diabetic peripheral angiopathy without gangrene: Secondary | ICD-10-CM | POA: Diagnosis not present

## 2014-03-11 DIAGNOSIS — I739 Peripheral vascular disease, unspecified: Secondary | ICD-10-CM | POA: Diagnosis not present

## 2014-03-11 DIAGNOSIS — Z6825 Body mass index (BMI) 25.0-25.9, adult: Secondary | ICD-10-CM | POA: Diagnosis not present

## 2014-03-11 DIAGNOSIS — E119 Type 2 diabetes mellitus without complications: Secondary | ICD-10-CM | POA: Diagnosis not present

## 2014-04-23 DIAGNOSIS — I10 Essential (primary) hypertension: Secondary | ICD-10-CM | POA: Diagnosis not present

## 2014-04-23 DIAGNOSIS — Z125 Encounter for screening for malignant neoplasm of prostate: Secondary | ICD-10-CM | POA: Diagnosis not present

## 2014-04-23 DIAGNOSIS — E1151 Type 2 diabetes mellitus with diabetic peripheral angiopathy without gangrene: Secondary | ICD-10-CM | POA: Diagnosis not present

## 2014-04-23 DIAGNOSIS — N183 Chronic kidney disease, stage 3 (moderate): Secondary | ICD-10-CM | POA: Diagnosis not present

## 2014-04-23 DIAGNOSIS — E785 Hyperlipidemia, unspecified: Secondary | ICD-10-CM | POA: Diagnosis not present

## 2014-05-01 DIAGNOSIS — N529 Male erectile dysfunction, unspecified: Secondary | ICD-10-CM | POA: Diagnosis not present

## 2014-05-01 DIAGNOSIS — E291 Testicular hypofunction: Secondary | ICD-10-CM | POA: Diagnosis not present

## 2014-05-01 DIAGNOSIS — F329 Major depressive disorder, single episode, unspecified: Secondary | ICD-10-CM | POA: Diagnosis not present

## 2014-05-01 DIAGNOSIS — N183 Chronic kidney disease, stage 3 (moderate): Secondary | ICD-10-CM | POA: Diagnosis not present

## 2014-05-01 DIAGNOSIS — Z0000 Encounter for general adult medical examination without abnormal findings: Secondary | ICD-10-CM | POA: Diagnosis not present

## 2014-05-01 DIAGNOSIS — I739 Peripheral vascular disease, unspecified: Secondary | ICD-10-CM | POA: Diagnosis not present

## 2014-05-01 DIAGNOSIS — E785 Hyperlipidemia, unspecified: Secondary | ICD-10-CM | POA: Diagnosis not present

## 2014-05-01 DIAGNOSIS — M545 Low back pain: Secondary | ICD-10-CM | POA: Diagnosis not present

## 2014-05-01 DIAGNOSIS — I10 Essential (primary) hypertension: Secondary | ICD-10-CM | POA: Diagnosis not present

## 2014-05-01 DIAGNOSIS — Z6824 Body mass index (BMI) 24.0-24.9, adult: Secondary | ICD-10-CM | POA: Diagnosis not present

## 2014-05-01 DIAGNOSIS — Z1389 Encounter for screening for other disorder: Secondary | ICD-10-CM | POA: Diagnosis not present

## 2014-05-01 DIAGNOSIS — E1151 Type 2 diabetes mellitus with diabetic peripheral angiopathy without gangrene: Secondary | ICD-10-CM | POA: Diagnosis not present

## 2014-05-04 DIAGNOSIS — Z1212 Encounter for screening for malignant neoplasm of rectum: Secondary | ICD-10-CM | POA: Diagnosis not present

## 2014-05-18 ENCOUNTER — Encounter: Payer: Self-pay | Admitting: Internal Medicine

## 2014-06-11 DIAGNOSIS — R195 Other fecal abnormalities: Secondary | ICD-10-CM | POA: Diagnosis not present

## 2014-06-25 DIAGNOSIS — E1151 Type 2 diabetes mellitus with diabetic peripheral angiopathy without gangrene: Secondary | ICD-10-CM | POA: Diagnosis not present

## 2014-06-25 DIAGNOSIS — M79605 Pain in left leg: Secondary | ICD-10-CM | POA: Diagnosis not present

## 2014-06-25 DIAGNOSIS — H60509 Unspecified acute noninfective otitis externa, unspecified ear: Secondary | ICD-10-CM | POA: Diagnosis not present

## 2014-07-09 ENCOUNTER — Ambulatory Visit: Payer: Medicare Other | Admitting: Internal Medicine

## 2014-07-15 ENCOUNTER — Encounter: Payer: Self-pay | Admitting: Internal Medicine

## 2014-07-15 ENCOUNTER — Ambulatory Visit (INDEPENDENT_AMBULATORY_CARE_PROVIDER_SITE_OTHER): Payer: Medicare Other | Admitting: Internal Medicine

## 2014-07-15 VITALS — BP 110/64 | HR 72 | Ht 68.75 in | Wt 179.2 lb

## 2014-07-15 DIAGNOSIS — R195 Other fecal abnormalities: Secondary | ICD-10-CM

## 2014-07-15 DIAGNOSIS — K5901 Slow transit constipation: Secondary | ICD-10-CM

## 2014-07-15 MED ORDER — NA SULFATE-K SULFATE-MG SULF 17.5-3.13-1.6 GM/177ML PO SOLN: 1.0000 | Freq: Once | ORAL | Status: DC
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
## 2014-07-15 NOTE — Patient Instructions (Addendum)

## 2014-09-03 DIAGNOSIS — L821 Other seborrheic keratosis: Secondary | ICD-10-CM | POA: Diagnosis not present

## 2014-09-03 DIAGNOSIS — D485 Neoplasm of uncertain behavior of skin: Secondary | ICD-10-CM | POA: Diagnosis not present

## 2014-09-03 DIAGNOSIS — L905 Scar conditions and fibrosis of skin: Secondary | ICD-10-CM | POA: Diagnosis not present

## 2014-09-03 DIAGNOSIS — L859 Epidermal thickening, unspecified: Secondary | ICD-10-CM | POA: Diagnosis not present

## 2014-09-24 ENCOUNTER — Encounter: Payer: Medicare Other | Admitting: Internal Medicine

## 2014-09-28 DIAGNOSIS — E119 Type 2 diabetes mellitus without complications: Secondary | ICD-10-CM | POA: Diagnosis not present

## 2014-10-19 DIAGNOSIS — Z6825 Body mass index (BMI) 25.0-25.9, adult: Secondary | ICD-10-CM | POA: Diagnosis not present

## 2014-10-19 DIAGNOSIS — E1151 Type 2 diabetes mellitus with diabetic peripheral angiopathy without gangrene: Secondary | ICD-10-CM | POA: Diagnosis not present

## 2014-10-19 DIAGNOSIS — Z23 Encounter for immunization: Secondary | ICD-10-CM | POA: Diagnosis not present

## 2014-10-19 DIAGNOSIS — M545 Low back pain: Secondary | ICD-10-CM | POA: Diagnosis not present

## 2014-10-19 DIAGNOSIS — I739 Peripheral vascular disease, unspecified: Secondary | ICD-10-CM | POA: Diagnosis not present

## 2014-10-19 DIAGNOSIS — N183 Chronic kidney disease, stage 3 (moderate): Secondary | ICD-10-CM | POA: Diagnosis not present

## 2014-11-24 ENCOUNTER — Encounter: Payer: Medicare Other | Admitting: Internal Medicine

## 2015-02-16 DIAGNOSIS — Z1389 Encounter for screening for other disorder: Secondary | ICD-10-CM | POA: Diagnosis not present

## 2015-02-16 DIAGNOSIS — N183 Chronic kidney disease, stage 3 (moderate): Secondary | ICD-10-CM | POA: Diagnosis not present

## 2015-02-16 DIAGNOSIS — I10 Essential (primary) hypertension: Secondary | ICD-10-CM | POA: Diagnosis not present

## 2015-02-16 DIAGNOSIS — Z6825 Body mass index (BMI) 25.0-25.9, adult: Secondary | ICD-10-CM | POA: Diagnosis not present

## 2015-02-16 DIAGNOSIS — I739 Peripheral vascular disease, unspecified: Secondary | ICD-10-CM | POA: Diagnosis not present

## 2015-02-16 DIAGNOSIS — E1151 Type 2 diabetes mellitus with diabetic peripheral angiopathy without gangrene: Secondary | ICD-10-CM | POA: Diagnosis not present

## 2015-02-16 DIAGNOSIS — M545 Low back pain: Secondary | ICD-10-CM | POA: Diagnosis not present

## 2015-04-21 DIAGNOSIS — M25572 Pain in left ankle and joints of left foot: Secondary | ICD-10-CM | POA: Diagnosis not present

## 2015-04-21 DIAGNOSIS — I87322 Chronic venous hypertension (idiopathic) with inflammation of left lower extremity: Secondary | ICD-10-CM | POA: Diagnosis not present

## 2015-05-17 DIAGNOSIS — I87322 Chronic venous hypertension (idiopathic) with inflammation of left lower extremity: Secondary | ICD-10-CM | POA: Diagnosis not present

## 2015-05-17 DIAGNOSIS — M25572 Pain in left ankle and joints of left foot: Secondary | ICD-10-CM | POA: Diagnosis not present

## 2015-06-01 DIAGNOSIS — I7389 Other specified peripheral vascular diseases: Secondary | ICD-10-CM | POA: Diagnosis not present

## 2015-06-01 DIAGNOSIS — Z6825 Body mass index (BMI) 25.0-25.9, adult: Secondary | ICD-10-CM | POA: Diagnosis not present

## 2015-06-01 DIAGNOSIS — M545 Low back pain: Secondary | ICD-10-CM | POA: Diagnosis not present

## 2015-06-01 DIAGNOSIS — I10 Essential (primary) hypertension: Secondary | ICD-10-CM | POA: Diagnosis not present

## 2015-06-01 DIAGNOSIS — E11622 Type 2 diabetes mellitus with other skin ulcer: Secondary | ICD-10-CM | POA: Diagnosis not present

## 2015-06-01 DIAGNOSIS — E1151 Type 2 diabetes mellitus with diabetic peripheral angiopathy without gangrene: Secondary | ICD-10-CM | POA: Diagnosis not present

## 2015-07-12 DIAGNOSIS — M6740 Ganglion, unspecified site: Secondary | ICD-10-CM | POA: Diagnosis not present

## 2015-07-12 DIAGNOSIS — E11622 Type 2 diabetes mellitus with other skin ulcer: Secondary | ICD-10-CM | POA: Diagnosis not present

## 2015-07-12 DIAGNOSIS — I7389 Other specified peripheral vascular diseases: Secondary | ICD-10-CM | POA: Diagnosis not present

## 2015-07-12 DIAGNOSIS — Z6824 Body mass index (BMI) 24.0-24.9, adult: Secondary | ICD-10-CM | POA: Diagnosis not present

## 2015-07-12 DIAGNOSIS — I10 Essential (primary) hypertension: Secondary | ICD-10-CM | POA: Diagnosis not present

## 2015-07-12 DIAGNOSIS — E1151 Type 2 diabetes mellitus with diabetic peripheral angiopathy without gangrene: Secondary | ICD-10-CM | POA: Diagnosis not present

## 2015-07-22 DIAGNOSIS — R2231 Localized swelling, mass and lump, right upper limb: Secondary | ICD-10-CM | POA: Diagnosis not present

## 2015-07-22 DIAGNOSIS — M720 Palmar fascial fibromatosis [Dupuytren]: Secondary | ICD-10-CM | POA: Diagnosis not present

## 2015-08-31 ENCOUNTER — Ambulatory Visit (INDEPENDENT_AMBULATORY_CARE_PROVIDER_SITE_OTHER): Payer: Medicare Other

## 2015-08-31 ENCOUNTER — Ambulatory Visit (INDEPENDENT_AMBULATORY_CARE_PROVIDER_SITE_OTHER): Payer: Medicare Other | Admitting: Podiatry

## 2015-08-31 ENCOUNTER — Encounter: Payer: Self-pay | Admitting: Podiatry

## 2015-08-31 DIAGNOSIS — M722 Plantar fascial fibromatosis: Secondary | ICD-10-CM

## 2015-08-31 NOTE — Patient Instructions (Signed)

## 2015-09-01 NOTE — Progress Notes (Signed)
He presents today with a chief complaint of pain to his heels first thing in the morning before he gets out of bed and as he steps onto the floor. He has a history of diabetes but states this is been going on for the past couple of months now and has had little relief.  Objective: Vital signs are stable he's alert and oriented 3 pulses are palpable. Neurologic sensory was intact. Deep tendon reflexes are intact muscle strength +5 over 5 dorsi plantar flexors and inverters everters on his musculatures intact. Orthopedic evaluation of Mr. Tolle joints distal to the ankle for range of motion without crepitation. Cutaneous evaluation of Mr. supple hydrated cutis. No open lesions or wounds. Radiographs taken today do demonstrate soft tissue increase in density at the plantar fascia calcaneal insertion site bilaterally. On palpation medial calcaneal tubercles and radiographs are consistent with plantar fasciitis.  Assessment: Plantar fasciitis bilateral heels.  Plan: Injected the bilateral heels today placed in plantar fascia strapping bilateral and a night splint bilateral. Discussed appropriate shoe gear stretching exercises ice therapy shoe gear modifications.

## 2015-09-13 DIAGNOSIS — E119 Type 2 diabetes mellitus without complications: Secondary | ICD-10-CM | POA: Diagnosis not present

## 2015-09-23 ENCOUNTER — Ambulatory Visit (INDEPENDENT_AMBULATORY_CARE_PROVIDER_SITE_OTHER): Payer: Medicare Other | Admitting: Podiatry

## 2015-09-23 ENCOUNTER — Encounter: Payer: Self-pay | Admitting: Podiatry

## 2015-09-23 DIAGNOSIS — M722 Plantar fascial fibromatosis: Secondary | ICD-10-CM

## 2015-09-23 NOTE — Progress Notes (Signed)
He presents today for follow-up of his plantar fasciitis. He states that he is 100% well.  Objective: Vital signs are stable alert and oriented 3. Pulses are palpable. He is no reproducible pain on palpation of bilateral heels today pain pulses remain palpable.  Assessment: Well healing for her fasciitis.  Plan: I encouraged him to continue all conservative therapies for 1 more month we'll follow-up with him as needed.

## 2015-09-30 ENCOUNTER — Ambulatory Visit: Payer: Medicare Other | Admitting: Podiatry

## 2015-10-25 DIAGNOSIS — I7389 Other specified peripheral vascular diseases: Secondary | ICD-10-CM | POA: Diagnosis not present

## 2015-10-25 DIAGNOSIS — R05 Cough: Secondary | ICD-10-CM | POA: Diagnosis not present

## 2015-10-25 DIAGNOSIS — I10 Essential (primary) hypertension: Secondary | ICD-10-CM | POA: Diagnosis not present

## 2015-10-25 DIAGNOSIS — E1151 Type 2 diabetes mellitus with diabetic peripheral angiopathy without gangrene: Secondary | ICD-10-CM | POA: Diagnosis not present

## 2015-10-25 DIAGNOSIS — Z6824 Body mass index (BMI) 24.0-24.9, adult: Secondary | ICD-10-CM | POA: Diagnosis not present

## 2015-11-04 ENCOUNTER — Ambulatory Visit: Payer: Medicare Other | Admitting: Podiatry

## 2015-11-16 ENCOUNTER — Encounter: Payer: Self-pay | Admitting: Podiatry

## 2015-11-16 ENCOUNTER — Ambulatory Visit (INDEPENDENT_AMBULATORY_CARE_PROVIDER_SITE_OTHER): Payer: Medicare Other | Admitting: Podiatry

## 2015-11-16 DIAGNOSIS — M722 Plantar fascial fibromatosis: Secondary | ICD-10-CM | POA: Diagnosis not present

## 2015-11-17 NOTE — Progress Notes (Signed)
He presents today stating that his heels have recently started hurting him once again. He states that he was doing very good until he went to [place] and do a lot of walking.  Objective: Vital signs are stable alert and oriented 3. Pulses are palpable. Neurologic sensorium is intact. Degenerative flexors are intact he has pain on palpation medial calcaneal tubercles bilateral.  Assessment: Plantar fasciitis bilateral.  Plan: I reinjected the bilateral heels today encouraged him to continue all conservative therapies. If this does not alleviate his symptoms once again then we must consider changing his orthotics.

## 2015-12-07 DIAGNOSIS — Z23 Encounter for immunization: Secondary | ICD-10-CM | POA: Diagnosis not present

## 2015-12-14 ENCOUNTER — Encounter: Payer: Self-pay | Admitting: Podiatry

## 2015-12-14 ENCOUNTER — Ambulatory Visit (INDEPENDENT_AMBULATORY_CARE_PROVIDER_SITE_OTHER): Payer: Medicare Other | Admitting: Podiatry

## 2015-12-14 DIAGNOSIS — M79676 Pain in unspecified toe(s): Secondary | ICD-10-CM | POA: Diagnosis not present

## 2015-12-14 DIAGNOSIS — B351 Tinea unguium: Secondary | ICD-10-CM | POA: Diagnosis not present

## 2015-12-14 DIAGNOSIS — M722 Plantar fascial fibromatosis: Secondary | ICD-10-CM

## 2015-12-15 NOTE — Progress Notes (Signed)
[name] presents today for follow-up of his plantar fasciitis and a chief complaint of painful elongated toenails. He states that the plantar fasciitis has resolved to proximally 60%. He states that it does not hurt every day or even every other day but just occasionally. He states that this is entirely tolerable for him and he does not want any more injections.  Objective: Vital signs are stable he is alert and oriented 3. Pulses are palpable. His toenails are thick yellow dystrophic with mycotic sharply incurvated. He has minimal pain on palpation medial continue tubercles bilateral.  Assessment: Plantar fasciitis resolving. Pain in limb seeing her onychomycosis.  Plan: Debridement of toenails bilateral.

## 2016-01-03 DIAGNOSIS — M79605 Pain in left leg: Secondary | ICD-10-CM | POA: Diagnosis not present

## 2016-01-03 DIAGNOSIS — I7389 Other specified peripheral vascular diseases: Secondary | ICD-10-CM | POA: Diagnosis not present

## 2016-01-03 DIAGNOSIS — Z6824 Body mass index (BMI) 24.0-24.9, adult: Secondary | ICD-10-CM | POA: Diagnosis not present

## 2016-01-03 DIAGNOSIS — I10 Essential (primary) hypertension: Secondary | ICD-10-CM | POA: Diagnosis not present

## 2016-01-03 DIAGNOSIS — M545 Low back pain: Secondary | ICD-10-CM | POA: Diagnosis not present

## 2016-01-03 DIAGNOSIS — E1151 Type 2 diabetes mellitus with diabetic peripheral angiopathy without gangrene: Secondary | ICD-10-CM | POA: Diagnosis not present

## 2016-03-14 DIAGNOSIS — E119 Type 2 diabetes mellitus without complications: Secondary | ICD-10-CM | POA: Diagnosis not present

## 2016-04-20 DIAGNOSIS — N183 Chronic kidney disease, stage 3 (moderate): Secondary | ICD-10-CM | POA: Diagnosis not present

## 2016-04-20 DIAGNOSIS — E1151 Type 2 diabetes mellitus with diabetic peripheral angiopathy without gangrene: Secondary | ICD-10-CM | POA: Diagnosis not present

## 2016-04-20 DIAGNOSIS — E784 Other hyperlipidemia: Secondary | ICD-10-CM | POA: Diagnosis not present

## 2016-04-20 DIAGNOSIS — Z125 Encounter for screening for malignant neoplasm of prostate: Secondary | ICD-10-CM | POA: Diagnosis not present

## 2016-04-27 DIAGNOSIS — Z0000 Encounter for general adult medical examination without abnormal findings: Secondary | ICD-10-CM | POA: Diagnosis not present

## 2016-04-27 DIAGNOSIS — E784 Other hyperlipidemia: Secondary | ICD-10-CM | POA: Diagnosis not present

## 2016-04-27 DIAGNOSIS — Z6824 Body mass index (BMI) 24.0-24.9, adult: Secondary | ICD-10-CM | POA: Diagnosis not present

## 2016-04-27 DIAGNOSIS — I7389 Other specified peripheral vascular diseases: Secondary | ICD-10-CM | POA: Diagnosis not present

## 2016-04-27 DIAGNOSIS — E1151 Type 2 diabetes mellitus with diabetic peripheral angiopathy without gangrene: Secondary | ICD-10-CM | POA: Diagnosis not present

## 2016-04-27 DIAGNOSIS — G4709 Other insomnia: Secondary | ICD-10-CM | POA: Diagnosis not present

## 2016-04-27 DIAGNOSIS — M79605 Pain in left leg: Secondary | ICD-10-CM | POA: Diagnosis not present

## 2016-04-27 DIAGNOSIS — Z1389 Encounter for screening for other disorder: Secondary | ICD-10-CM | POA: Diagnosis not present

## 2016-04-27 DIAGNOSIS — I10 Essential (primary) hypertension: Secondary | ICD-10-CM | POA: Diagnosis not present

## 2016-04-27 DIAGNOSIS — E298 Other testicular dysfunction: Secondary | ICD-10-CM | POA: Diagnosis not present

## 2016-04-27 DIAGNOSIS — M545 Low back pain: Secondary | ICD-10-CM | POA: Diagnosis not present

## 2016-04-27 DIAGNOSIS — N183 Chronic kidney disease, stage 3 (moderate): Secondary | ICD-10-CM | POA: Diagnosis not present

## 2016-05-01 DIAGNOSIS — Z1212 Encounter for screening for malignant neoplasm of rectum: Secondary | ICD-10-CM | POA: Diagnosis not present

## 2016-05-04 DIAGNOSIS — L570 Actinic keratosis: Secondary | ICD-10-CM | POA: Diagnosis not present

## 2016-05-04 DIAGNOSIS — L308 Other specified dermatitis: Secondary | ICD-10-CM | POA: Diagnosis not present

## 2016-06-01 ENCOUNTER — Ambulatory Visit (INDEPENDENT_AMBULATORY_CARE_PROVIDER_SITE_OTHER): Payer: Medicare Other | Admitting: Podiatry

## 2016-06-01 ENCOUNTER — Encounter: Payer: Self-pay | Admitting: Podiatry

## 2016-06-01 DIAGNOSIS — M722 Plantar fascial fibromatosis: Secondary | ICD-10-CM | POA: Diagnosis not present

## 2016-06-01 DIAGNOSIS — E0842 Diabetes mellitus due to underlying condition with diabetic polyneuropathy: Secondary | ICD-10-CM

## 2016-06-01 DIAGNOSIS — Q828 Other specified congenital malformations of skin: Secondary | ICD-10-CM | POA: Diagnosis not present

## 2016-06-03 NOTE — Progress Notes (Signed)
[name] presents today after having not seen him since November with a chief complaint of bilateral heel pain. He said they're hurting again and he would like for me to trim the callus on his forefoot.  Objective: Vital signs are stable he is alert and oriented 3. Pulses are palpable. Neurologic sensorium is intact. Deep tendon reflexes are intact. Reactive hyperkeratosis to the plantar aspect of the first metatarsophalangeal joint of the right foot. Pain on palpation medial calcaneal tubercles bilateral.  Assessment: Porokeratosis right. Plantar fasciitis bilateral.  Plan: Debrided all reactive hyperkeratosis plantar aspect of the bilateral fifth metatarsal and also injected the bilateral heels and along local anesthetic.

## 2016-06-27 ENCOUNTER — Telehealth: Payer: Self-pay | Admitting: *Deleted

## 2016-06-27 NOTE — Telephone Encounter (Signed)
Left message for patient that orthotics are in and to call and schedule an appointment for a fitting.

## 2016-08-01 ENCOUNTER — Encounter: Payer: Medicare Other | Admitting: Orthotics

## 2016-08-28 ENCOUNTER — Ambulatory Visit: Payer: Medicare Other | Admitting: Orthotics

## 2016-08-28 DIAGNOSIS — M722 Plantar fascial fibromatosis: Secondary | ICD-10-CM

## 2016-08-28 NOTE — Progress Notes (Signed)
Sending F/O back to Ever to make adjustments;  Add 1/8 ppt entire device, balance 5th met left and cut out fascia grove rt.

## 2016-09-04 DIAGNOSIS — N529 Male erectile dysfunction, unspecified: Secondary | ICD-10-CM | POA: Diagnosis not present

## 2016-09-04 DIAGNOSIS — E291 Testicular hypofunction: Secondary | ICD-10-CM | POA: Diagnosis not present

## 2016-09-04 DIAGNOSIS — E1151 Type 2 diabetes mellitus with diabetic peripheral angiopathy without gangrene: Secondary | ICD-10-CM | POA: Diagnosis not present

## 2016-09-04 DIAGNOSIS — E784 Other hyperlipidemia: Secondary | ICD-10-CM | POA: Diagnosis not present

## 2016-09-04 DIAGNOSIS — I739 Peripheral vascular disease, unspecified: Secondary | ICD-10-CM | POA: Diagnosis not present

## 2016-09-04 DIAGNOSIS — I10 Essential (primary) hypertension: Secondary | ICD-10-CM | POA: Diagnosis not present

## 2016-09-04 DIAGNOSIS — F329 Major depressive disorder, single episode, unspecified: Secondary | ICD-10-CM | POA: Diagnosis not present

## 2016-09-04 DIAGNOSIS — N183 Chronic kidney disease, stage 3 (moderate): Secondary | ICD-10-CM | POA: Diagnosis not present

## 2016-09-04 DIAGNOSIS — Z6824 Body mass index (BMI) 24.0-24.9, adult: Secondary | ICD-10-CM | POA: Diagnosis not present

## 2016-09-04 DIAGNOSIS — M545 Low back pain: Secondary | ICD-10-CM | POA: Diagnosis not present

## 2016-09-12 DIAGNOSIS — H2513 Age-related nuclear cataract, bilateral: Secondary | ICD-10-CM | POA: Diagnosis not present

## 2016-09-12 DIAGNOSIS — E119 Type 2 diabetes mellitus without complications: Secondary | ICD-10-CM | POA: Diagnosis not present

## 2016-09-20 ENCOUNTER — Other Ambulatory Visit: Payer: Medicare Other | Admitting: Orthotics

## 2016-09-20 ENCOUNTER — Ambulatory Visit (INDEPENDENT_AMBULATORY_CARE_PROVIDER_SITE_OTHER): Payer: Medicare Other | Admitting: Family

## 2016-09-20 DIAGNOSIS — L03116 Cellulitis of left lower limb: Secondary | ICD-10-CM

## 2016-09-20 MED ORDER — SULFAMETHOXAZOLE-TRIMETHOPRIM 800-160 MG PO TABS: 1.0000 | ORAL_TABLET | Freq: Two times a day (BID) | ORAL | 0 refills | Status: DC

## 2016-09-20 MED ORDER — SULFAMETHOXAZOLE-TRIMETHOPRIM 800-160 MG PO TABS
1.0000 | ORAL_TABLET | Freq: Two times a day (BID) | ORAL | 0 refills | Status: DC
Start: 2016-09-20 — End: 2016-09-20

## 2016-09-20 NOTE — Addendum Note (Signed)
Addended by: Suzan Slick on: 09/20/2016 04:37 PM   Modules accepted: Orders
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
## 2016-09-27 ENCOUNTER — Telehealth (INDEPENDENT_AMBULATORY_CARE_PROVIDER_SITE_OTHER): Payer: Self-pay | Admitting: Radiology

## 2016-09-27 NOTE — Telephone Encounter (Signed)
Patients wife calling today complaining of increase of what appears to be cellulitis. There is swelling and warmth, discoloration. Patient is already on oral antibiotics. After listening her to describe all of the patients symptoms. I explained we could order ultrasound outpatient, but however if this is ascending cellulitis iv antibiotics would be needed. I feel like patient could be best treated in ER, and be able to get doppler if needed and antibiotics iv or any imaging needed. She is going to either [facility] or [facility], advised to please to go to [facility]. They are not big fans of [facility]. They may go to [facility] instead, either way we should be able to track in EPIC.

## 2016-09-28 ENCOUNTER — Ambulatory Visit (INDEPENDENT_AMBULATORY_CARE_PROVIDER_SITE_OTHER): Payer: Medicare Other | Admitting: Orthopedic Surgery

## 2016-09-28 ENCOUNTER — Encounter (INDEPENDENT_AMBULATORY_CARE_PROVIDER_SITE_OTHER): Payer: Self-pay | Admitting: Orthopedic Surgery

## 2016-09-28 ENCOUNTER — Ambulatory Visit (INDEPENDENT_AMBULATORY_CARE_PROVIDER_SITE_OTHER): Payer: Medicare Other

## 2016-09-28 DIAGNOSIS — M25512 Pain in left shoulder: Secondary | ICD-10-CM

## 2016-09-28 DIAGNOSIS — M67912 Unspecified disorder of synovium and tendon, left shoulder: Secondary | ICD-10-CM

## 2016-09-28 DIAGNOSIS — M7502 Adhesive capsulitis of left shoulder: Secondary | ICD-10-CM

## 2016-09-28 MED ORDER — LIDOCAINE HCL 1 % IJ SOLN
5.0000 mL | INTRAMUSCULAR | Status: AC | PRN
  Administered 2016-09-28: 5 mL

## 2016-09-28 MED ORDER — METHYLPREDNISOLONE ACETATE 40 MG/ML IJ SUSP
40.0000 mg | INTRAMUSCULAR | Status: AC | PRN
Start: 2016-09-28 — End: 2016-09-28
  Administered 2016-09-28: 40 mg via INTRA_ARTICULAR

## 2016-09-28 NOTE — Telephone Encounter (Signed)
I called patients wife today [name] to follow up, he is feeling much better with leg. Decrease in swelling, warmth and discoloration. They did not go to the ER. Now patient has new unknown pain in shoulder. Advised to come into office today for evaluation. Appointment made at 1 pm with Dr. Sharol Given.

## 2016-09-28 NOTE — Progress Notes (Signed)
Office Visit Note   Patient: [name]           Date of Birth: 1955/12/25           MRN: [id number] Visit Date: 09/28/2016              Requested by: Leanna Battles, [place], [place] [address] PCP: Leanna Battles, MD  Chief Complaint  Patient presents with  . Right Leg - Follow-up  . Left Shoulder - Pain      HPI: The patient is 61 year old gentleman seen today for 2 separate issues. He is seen in follow-up for cellulitis of the left lower extremity this has significantly improved. Is no longer having tenderness does continue to have some erythema no swelling. He is also complaining of acute left shoulder pain. Did fall down stairs on Sunday while Wednesday was unable to raise left arm above his head. This is been gradually worsening. No neck pain. No numbness or tingling. Primarily anterior shoulder pain with loss of rom.   Assessment & Plan: Visit Diagnoses:  1. Tendinopathy of left rotator cuff   2. Left shoulder pain, unspecified chronicity   3. Adhesive capsulitis of left shoulder     Plan: Depomedrol injection left shoulder.  Discussed arthroscopy. If no relief from injection may proceed with arthroscopy left shoulder. All questions encouraged and answered.   Seen today together with Dr. Sharol Given.  Follow-Up Instructions: Return in about 4 weeks (around 10/26/2016), or if symptoms worsen or fail to improve.   Left Shoulder Exam   Tenderness  The patient is experiencing tenderness in the biceps tendon.  Range of Motion  Passive Abduction: 50  Forward Flexion: 70   Tests  Impingement: positive  Other  Erythema: absent Pulse: present       Patient is alert, oriented, no adenopathy, well-dressed, normal affect, normal respiratory effort. LLE with mild erythema distally, no tenderness or induration, trace edema. No weeping. No warmth.  Imaging: Xr Shoulder Left  Result Date: 09/28/2016 Radiographs of left shoulder show calcification  of RC tendons. No acute finding.   No images are attached to the encounter.  Labs: No results found for: HGBA1C, ESRSEDRATE, CRP, LABURIC, REPTSTATUS, GRAMSTAIN, CULT, LABORGA  Orders:  Orders Placed This Encounter  Procedures  . XR Shoulder Left   No orders of the defined types were placed in this encounter.    Procedures: Large Joint Inj Date/Time: 09/28/2016 2:15 PM Performed by: Suzan Slick Authorized by: Dondra Prader R   Consent Given by:  Patient Site marked: the procedure site was marked   Timeout: prior to procedure the correct patient, procedure, and site was verified   Indications:  Pain and diagnostic evaluation Location:  Shoulder Site:  L subacromial bursa Prep: patient was prepped and draped in usual sterile fashion   Needle Size:  22 G Needle Length:  1.5 inches Ultrasound Guidance: No   Fluoroscopic Guidance: No   Arthrogram: No   Medications:  5 mL lidocaine 1 %; 40 mg methylPREDNISolone acetate 40 MG/ML Aspiration Attempted: No   Patient tolerance:  Patient tolerated the procedure well with no immediate complications    Clinical Data: No additional findings.  ROS:  All other systems negative, except as noted in the HPI. Review of Systems  Constitutional: Negative for chills and fever.  Cardiovascular: Positive for leg swelling.  Musculoskeletal: Positive for arthralgias.  Skin: Positive for color change. Negative for wound.  Neurological: Negative for numbness.  Objective: Vital Signs: There were no vitals taken for this visit.  Specialty Comments:  No specialty comments available.  PMFS History: Patient Active Problem List   Diagnosis Date Noted  . Essential hypertension 08/01/2013  . Chronic ulcer of left ankle ([place]) 08/01/2013  . Type I (juvenile type) diabetes mellitus with neurological manifestations, not stated as uncontrolled(250.61) 10/03/2012  . Unspecified essential hypertension 10/03/2012  . Dyslipidemia 10/03/2012  .  Insomnia 10/03/2012  . Pain in joint, lower leg 10/02/2012  . Special screening for malignant neoplasms, colon 07/26/2010   Past Medical History:  Diagnosis Date  . Anxiety   . Arthritis   . Chronic narcotic use   . Depression   . Diabetic peripheral neuropathy ([place])   . Hyperlipidemia   . Hypertension   . Tobacco abuse   . Type 1 diabetes mellitus ([place])   . Ulcer of left ankle ([facility])     Family History  Problem Relation Age of Onset  . Prostate cancer Paternal Uncle   . Prostate cancer Maternal Grandfather   . Diabetes Mother   . Diabetes Father   . Heart failure Father     Past Surgical History:  Procedure Laterality Date  . COLONOSCOPY WITH PROPOFOL  07-26-2010  . [place] had 16 tons of steel rolled over his feet  . LUMBAR LAMINECTOMY/DECOMPRESSION MICRODISCECTOMY  12-23-1997   LEFT  L5 -- S1  . TONSILLECTOMY     Social History   Occupational History  . disabled    Social History Main Topics  . Smoking status: Current Every Day Smoker    Packs/day: 1.00    Years: 40.00    Types: Cigarettes  . Smokeless tobacco: Never Used  . Alcohol use No  . Drug use: No  . Sexual activity: Not on file

## 2016-10-04 ENCOUNTER — Ambulatory Visit (INDEPENDENT_AMBULATORY_CARE_PROVIDER_SITE_OTHER): Payer: Self-pay

## 2016-10-04 ENCOUNTER — Ambulatory Visit (INDEPENDENT_AMBULATORY_CARE_PROVIDER_SITE_OTHER): Payer: Medicare Other | Admitting: Family

## 2016-10-04 ENCOUNTER — Ambulatory Visit (INDEPENDENT_AMBULATORY_CARE_PROVIDER_SITE_OTHER): Payer: Medicare Other

## 2016-10-04 DIAGNOSIS — M25572 Pain in left ankle and joints of left foot: Secondary | ICD-10-CM | POA: Diagnosis not present

## 2016-10-04 DIAGNOSIS — L03116 Cellulitis of left lower limb: Secondary | ICD-10-CM | POA: Diagnosis not present

## 2016-10-04 DIAGNOSIS — M67912 Unspecified disorder of synovium and tendon, left shoulder: Secondary | ICD-10-CM

## 2016-10-04 DIAGNOSIS — M25571 Pain in right ankle and joints of right foot: Secondary | ICD-10-CM | POA: Diagnosis not present

## 2016-10-04 DIAGNOSIS — I872 Venous insufficiency (chronic) (peripheral): Secondary | ICD-10-CM | POA: Insufficient documentation

## 2016-10-04 NOTE — Addendum Note (Signed)
Addended by: Dondra Prader R on: 10/04/2016 02:47 PM   Modules accepted: Orders

## 2016-10-04 NOTE — Progress Notes (Signed)
Office Visit Note   Patient: [name]           Date of Birth: 05-May-1955           MRN: [id number] Visit Date: 10/04/2016              Requested by: Leanna Battles, [address] [place], [place] [address] PCP: Leanna Battles, MD  Chief Complaint  Patient presents with  . Left Shoulder - Follow-up      HPI: The patient is a 61 year old gentleman who presents today complaining of ongoing pain to his left lower extremity around the ankle. Is poorly localized, is worse with ambulation and when dependent. Has been wearing compression stockings with minimal relief. Notes has had chills and hot flashes for last few days, no fever. States had some relief while taking the Bactrim which was started on 09/20/16, since completing has had recurrence/worsening of lower extremity pain.   Does have history venous stasis insufficiency and has worn compression stockings on the left in the past. Last had ABIs and was seen by Dr. Gwenlyn Found in 2015, ABIs were essentially normal at that time.   Remote ankle fracture on left, states has been "fused" since.  Assessment & Plan: Visit Diagnoses:  1. Venous insufficiency (chronic) (peripheral)   2. Tendinopathy of left shoulder   3. Pain in left ankle and joints of left foot   4. Bilateral ankle pain, unspecified chronicity   5. Cellulitis of left leg     Plan: continue his compression stockings. Will order an MRI to rule out osteomyelitis left ankle. Will call for any worsening.  Follow-Up Instructions: No Follow-up on file.   Ortho Exam  Patient is alert, oriented, no adenopathy, well-dressed, normal affect, normal respiratory effort. the left lower extremity with erythema and induration, is chronic, states appears as normally does today. No pitting. No edema. Mildly tender diffusely. No cellulitis. No open ulcerations. No weeping or drainage. No subtalar motion.  Imaging: No results found. No images are attached to the  encounter.  Labs: No results found for: HGBA1C, ESRSEDRATE, CRP, LABURIC, REPTSTATUS, GRAMSTAIN, CULT, LABORGA  Orders:  Orders Placed This Encounter  Procedures  . XR Ankle Complete Left  . XR Ankle Complete Right  . MR Ankle Left w/o contrast   No orders of the defined types were placed in this encounter.    Procedures: No procedures performed  Clinical Data: No additional findings.  ROS:  All other systems negative, except as noted in the HPI. Review of Systems  Constitutional: Positive for chills. Negative for fever.  Cardiovascular: Negative for leg swelling.  Skin: Positive for color change and rash. Negative for wound.    Objective: Vital Signs: There were no vitals taken for this visit.  Specialty Comments:  No specialty comments available.  PMFS History: Patient Active Problem List   Diagnosis Date Noted  . Venous insufficiency (chronic) (peripheral) 10/04/2016  . Essential hypertension 08/01/2013  . Chronic ulcer of left ankle ([place]) 08/01/2013  . Type I (juvenile type) diabetes mellitus with neurological manifestations, not stated as uncontrolled(250.61) 10/03/2012  . Unspecified essential hypertension 10/03/2012  . Dyslipidemia 10/03/2012  . Insomnia 10/03/2012  . Pain in joint, lower leg 10/02/2012  . Special screening for malignant neoplasms, colon 07/26/2010   Past Medical History:  Diagnosis Date  . Anxiety   . Arthritis   . Chronic narcotic use   . Depression   . Diabetic peripheral neuropathy ([place])   . Hyperlipidemia   .  Hypertension   . Tobacco abuse   . Type 1 diabetes mellitus ([place])   . Ulcer of left ankle ([facility])     Family History  Problem Relation Age of Onset  . Prostate cancer Paternal Uncle   . Prostate cancer Maternal Grandfather   . Diabetes Mother   . Diabetes Father   . Heart failure Father     Past Surgical History:  Procedure Laterality Date  . COLONOSCOPY WITH PROPOFOL  07-26-2010  . [place] had 16  tons of steel rolled over his feet  . LUMBAR LAMINECTOMY/DECOMPRESSION MICRODISCECTOMY  12-23-1997   LEFT  L5 -- S1  . TONSILLECTOMY     Social History   Occupational History  . disabled    Social History Main Topics  . Smoking status: Current Every Day Smoker    Packs/day: 1.00    Years: 40.00    Types: Cigarettes  . Smokeless tobacco: Never Used  . Alcohol use No  . Drug use: No  . Sexual activity: Not on file

## 2016-10-09 ENCOUNTER — Ambulatory Visit: Payer: Medicare Other | Admitting: Orthotics

## 2016-10-09 DIAGNOSIS — E0842 Diabetes mellitus due to underlying condition with diabetic polyneuropathy: Secondary | ICD-10-CM

## 2016-10-09 DIAGNOSIS — M79676 Pain in unspecified toe(s): Secondary | ICD-10-CM

## 2016-10-09 DIAGNOSIS — M722 Plantar fascial fibromatosis: Secondary | ICD-10-CM

## 2016-10-09 DIAGNOSIS — B351 Tinea unguium: Secondary | ICD-10-CM

## 2016-10-09 NOTE — Progress Notes (Signed)
Patient came in today to pick up custom made foot orthotics.  The goals were accomplished and the patient reported no dissatisfaction with said orthotics.  Patient was advised of breakin period and how to report any issues. 

## 2016-10-25 DIAGNOSIS — R05 Cough: Secondary | ICD-10-CM | POA: Diagnosis not present

## 2016-10-25 DIAGNOSIS — B370 Candidal stomatitis: Secondary | ICD-10-CM | POA: Diagnosis not present

## 2016-10-25 DIAGNOSIS — J45909 Unspecified asthma, uncomplicated: Secondary | ICD-10-CM | POA: Diagnosis not present

## 2016-10-25 DIAGNOSIS — Z6823 Body mass index (BMI) 23.0-23.9, adult: Secondary | ICD-10-CM | POA: Diagnosis not present

## 2016-10-25 DIAGNOSIS — I10 Essential (primary) hypertension: Secondary | ICD-10-CM | POA: Diagnosis not present

## 2016-10-26 ENCOUNTER — Ambulatory Visit (INDEPENDENT_AMBULATORY_CARE_PROVIDER_SITE_OTHER): Payer: Medicare Other | Admitting: Orthopedic Surgery

## 2016-10-31 ENCOUNTER — Ambulatory Visit: Payer: Medicare Other | Admitting: Orthotics

## 2016-10-31 DIAGNOSIS — E0842 Diabetes mellitus due to underlying condition with diabetic polyneuropathy: Secondary | ICD-10-CM

## 2016-10-31 DIAGNOSIS — M722 Plantar fascial fibromatosis: Secondary | ICD-10-CM

## 2016-10-31 DIAGNOSIS — Q828 Other specified congenital malformations of skin: Secondary | ICD-10-CM

## 2016-10-31 NOTE — Progress Notes (Signed)
Patient returned [employer] f/o saying they were too hard, need to get them remade with lighter density EVA/PPT/Zote.

## 2016-11-14 ENCOUNTER — Ambulatory Visit: Payer: Medicare Other | Admitting: Orthotics

## 2016-11-14 DIAGNOSIS — B351 Tinea unguium: Secondary | ICD-10-CM

## 2016-11-14 DIAGNOSIS — M79676 Pain in unspecified toe(s): Secondary | ICD-10-CM

## 2016-11-14 DIAGNOSIS — M722 Plantar fascial fibromatosis: Secondary | ICD-10-CM

## 2016-11-14 NOTE — Progress Notes (Signed)
Patient came in to pick up f/o (adjusted).  They weren't in.  Called [employer], they shipped on 10/26.  Will notify patient when they come in.

## 2016-12-19 ENCOUNTER — Telehealth: Payer: Self-pay | Admitting: Podiatry

## 2016-12-19 NOTE — Telephone Encounter (Signed)
Left message with pts wife to have him call to schedule an appt to pick up orthotics.. They are in.Marland Kitchen

## 2017-01-02 ENCOUNTER — Ambulatory Visit: Payer: Medicare Other | Admitting: Orthotics

## 2017-01-02 DIAGNOSIS — Q828 Other specified congenital malformations of skin: Secondary | ICD-10-CM

## 2017-01-02 DIAGNOSIS — E0842 Diabetes mellitus due to underlying condition with diabetic polyneuropathy: Secondary | ICD-10-CM

## 2017-01-02 NOTE — Progress Notes (Signed)
Patient came in today to pick up custom made foot orthotics.  The goals were accomplished and the patient reported no dissatisfaction with said orthotics.  Patient was advised of breakin period and how to report any issues. 

## 2017-01-04 DIAGNOSIS — R2681 Unsteadiness on feet: Secondary | ICD-10-CM | POA: Diagnosis not present

## 2017-01-10 DIAGNOSIS — R2681 Unsteadiness on feet: Secondary | ICD-10-CM | POA: Diagnosis not present

## 2017-03-01 ENCOUNTER — Encounter: Payer: Self-pay | Admitting: Podiatry

## 2017-03-01 ENCOUNTER — Ambulatory Visit: Payer: Medicare Other | Admitting: Orthotics

## 2017-03-01 ENCOUNTER — Ambulatory Visit (INDEPENDENT_AMBULATORY_CARE_PROVIDER_SITE_OTHER): Payer: Medicare Other | Admitting: Podiatry

## 2017-03-01 DIAGNOSIS — Q828 Other specified congenital malformations of skin: Secondary | ICD-10-CM | POA: Diagnosis not present

## 2017-03-01 DIAGNOSIS — M722 Plantar fascial fibromatosis: Secondary | ICD-10-CM

## 2017-03-01 DIAGNOSIS — M79676 Pain in unspecified toe(s): Secondary | ICD-10-CM

## 2017-03-01 DIAGNOSIS — B351 Tinea unguium: Secondary | ICD-10-CM

## 2017-03-01 DIAGNOSIS — E0842 Diabetes mellitus due to underlying condition with diabetic polyneuropathy: Secondary | ICD-10-CM | POA: Diagnosis not present

## 2017-03-01 NOTE — Progress Notes (Signed)
Adjusted f/o to add 5th met left cutout and plantar fascia groove Rt.

## 2017-03-03 NOTE — Progress Notes (Signed)
He presents today for follow-up of his heel pain.  He states it started to hurt again just recently.  Have not seen him since May of last year.  He states that his calluses have started to bother him again beneath the fifth metatarsal heads bilaterally and his hallux nails are thick.  Objective: Vital signs are stable he is alert and oriented x3.  Pulses are palpable.  Neurologic sensorium is intact.  No change in physical findings otherwise his hallux nails bilaterally are thick and dystrophic.  They are painful palpation.  He has pain on palpation medial calcaneal tubercles bilateral.  Reactive hyperkeratosis sub-fifth metatarsal heads bilateral.  These appear to be porokeratotic lesions no open lesions or wounds are noted.  Assessment: Pain in limb secondary to onychomycosis and porokeratosis.  Plantar fasciitis resolved but recurred.  Plan: Injected the bilateral heels today after verbal consent and sterile Betadine skin prep.  20 mg of Kenalog 5 mg Marcaine were injected bilateral heels medial aspect of the foot tolerated procedure well without comp occasions.  Debrided all reactive hyperkeratosis today also debrided toenails 1 through 5.

## 2017-04-09 ENCOUNTER — Other Ambulatory Visit: Payer: Medicare Other | Admitting: Orthotics

## 2017-04-26 ENCOUNTER — Ambulatory Visit: Payer: Medicare Other | Admitting: Orthotics

## 2017-04-26 DIAGNOSIS — M722 Plantar fascial fibromatosis: Secondary | ICD-10-CM

## 2017-04-26 DIAGNOSIS — Q828 Other specified congenital malformations of skin: Secondary | ICD-10-CM

## 2017-04-26 NOTE — Progress Notes (Signed)
Patient came in for adjustment of f/o; after assessing the f/o, it was determined we should start over with another Vendor ([employer]); the [employer] f/o was too flimsy and made patient feel unsteady on feet.

## 2017-05-17 ENCOUNTER — Ambulatory Visit: Payer: Medicare Other | Admitting: Orthotics

## 2017-05-17 DIAGNOSIS — Z794 Long term (current) use of insulin: Secondary | ICD-10-CM | POA: Diagnosis not present

## 2017-05-17 DIAGNOSIS — I872 Venous insufficiency (chronic) (peripheral): Secondary | ICD-10-CM

## 2017-05-17 DIAGNOSIS — I7389 Other specified peripheral vascular diseases: Secondary | ICD-10-CM | POA: Diagnosis not present

## 2017-05-17 DIAGNOSIS — E1151 Type 2 diabetes mellitus with diabetic peripheral angiopathy without gangrene: Secondary | ICD-10-CM | POA: Diagnosis not present

## 2017-05-17 NOTE — Progress Notes (Signed)
Patient picked up adjusted f/o from another vendor [name]) that seems to be going to work better than old [employer].

## 2017-07-06 DIAGNOSIS — E7849 Other hyperlipidemia: Secondary | ICD-10-CM | POA: Diagnosis not present

## 2017-07-06 DIAGNOSIS — Z125 Encounter for screening for malignant neoplasm of prostate: Secondary | ICD-10-CM | POA: Diagnosis not present

## 2017-07-06 DIAGNOSIS — N183 Chronic kidney disease, stage 3 (moderate): Secondary | ICD-10-CM | POA: Diagnosis not present

## 2017-07-06 DIAGNOSIS — R82998 Other abnormal findings in urine: Secondary | ICD-10-CM | POA: Diagnosis not present

## 2017-07-06 DIAGNOSIS — E1151 Type 2 diabetes mellitus with diabetic peripheral angiopathy without gangrene: Secondary | ICD-10-CM | POA: Diagnosis not present

## 2017-07-09 DIAGNOSIS — E7849 Other hyperlipidemia: Secondary | ICD-10-CM | POA: Diagnosis not present

## 2017-07-13 DIAGNOSIS — E1151 Type 2 diabetes mellitus with diabetic peripheral angiopathy without gangrene: Secondary | ICD-10-CM | POA: Diagnosis not present

## 2017-07-13 DIAGNOSIS — Z23 Encounter for immunization: Secondary | ICD-10-CM | POA: Diagnosis not present

## 2017-07-13 DIAGNOSIS — Z794 Long term (current) use of insulin: Secondary | ICD-10-CM | POA: Diagnosis not present

## 2017-07-13 DIAGNOSIS — R1319 Other dysphagia: Secondary | ICD-10-CM | POA: Diagnosis not present

## 2017-07-13 DIAGNOSIS — Z0000 Encounter for general adult medical examination without abnormal findings: Secondary | ICD-10-CM | POA: Diagnosis not present

## 2017-07-23 DIAGNOSIS — Z1212 Encounter for screening for malignant neoplasm of rectum: Secondary | ICD-10-CM | POA: Diagnosis not present

## 2017-07-31 ENCOUNTER — Encounter: Payer: Self-pay | Admitting: Neurology

## 2017-08-01 ENCOUNTER — Ambulatory Visit (INDEPENDENT_AMBULATORY_CARE_PROVIDER_SITE_OTHER): Payer: 59 | Admitting: Neurology

## 2017-08-01 ENCOUNTER — Encounter: Payer: Self-pay | Admitting: Neurology

## 2017-08-01 VITALS — BP 118/57 | HR 62 | Ht 70.0 in | Wt 167.0 lb

## 2017-08-01 DIAGNOSIS — F5105 Insomnia due to other mental disorder: Secondary | ICD-10-CM | POA: Diagnosis not present

## 2017-08-01 DIAGNOSIS — E1151 Type 2 diabetes mellitus with diabetic peripheral angiopathy without gangrene: Secondary | ICD-10-CM | POA: Diagnosis not present

## 2017-08-01 DIAGNOSIS — Z7722 Contact with and (suspected) exposure to environmental tobacco smoke (acute) (chronic): Secondary | ICD-10-CM

## 2017-08-01 DIAGNOSIS — F99 Mental disorder, not otherwise specified: Secondary | ICD-10-CM

## 2017-08-01 DIAGNOSIS — G4701 Insomnia due to medical condition: Secondary | ICD-10-CM | POA: Diagnosis not present

## 2017-08-01 DIAGNOSIS — G8929 Other chronic pain: Secondary | ICD-10-CM | POA: Diagnosis not present

## 2017-08-01 DIAGNOSIS — R0683 Snoring: Secondary | ICD-10-CM | POA: Insufficient documentation

## 2017-08-01 NOTE — Progress Notes (Signed)
SLEEP MEDICINE CLINIC   Provider:  Larey Seat, M D  Primary Care Physician:  Leanna Battles, MD   Referring Provider: Leanna Battles, MD    Chief Complaint  Patient presents with  . New Patient (Initial Visit)    pt here today with wife, caucasian , 62 year old , right handed male in rm 29. pt wears bilateral hearing aids. His wife ( audiology specialist) states his days and nights are mixed up and states he snores slightly. This [name]  was on ambien and had parasomnia.     HPI:  [name] is a 62 y.o. male patient , seen here as in a referral from Dr. Philip Aspen for a sleep medicine evaluation,  Mr [name] is a smoker, has a speech impediment,congenitial deafness, and presents with insomnia.  He took Ambien, was sleep walking under the influence of that drug and fell numerous times. He currently takes Doxepine 25 mg at night but this seems to provoke memory impairment- he could not recall his social security at check in, he begins sentences but doesn't finish them. Loses train of thought.    Chief complaint according to patient :" I no longer get up in the morning".   Sleep habits are as follows: Goes to bed between 4 and 6 AM- rises between 11 and 13 .00 hours. Eats , takes meds, and then he falls again until 2 Pm but sometimes until. dinnertime around 5-6 PM. He sometimes takes his dinner medication and then is sleepy - with a second wind around 11 Pm.  Bedroom is cool, quiet and dark- there is a TV ,  She has noted him to snore he is severe hearing loss has not made him appreciate this. The couple joined a senior center last month, and hopes this will help to revert his circadian disorder. I will introduce the boot camp for insomnia patient.    Sleep medical history and family sleep history: Mr. 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  Foot pain.  Congenital hearing loss.  Social history: remarried, disabled since 2005-2008 , current smoker - 50 plus pack years, ETOH ; none, caffeine 1 cup of coffee in AM and diet coke through the day. Smokes cannabis.  Caffeine: rarely tea. 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 Chronic pain, PVD and diabetic NP and vascular pain   Review of Systems: Out of a complete 14 system review, the patient complains of only the following symptoms, and all other reviewed systems are negative.   Epworth score 16/ 24  , Fatigue severity score 58/63 , depression score PHq 9 - 9   Social History   Socioeconomic History  . Marital status: Married    Spouse name: Not on file  . Number of children: 2  . Years of education: Not on file  . Highest education level: Not on file  Occupational History  . Occupation: disabled  Social Needs  . Financial resource strain: Not on file  . Food insecurity:    Worry: Not on file    Inability: Not on file  . Transportation needs:    Medical: Not on file  Non-medical: Not on file  Tobacco Use  . Smoking status: Current Every Day Smoker    Packs/day: 1.00    Years: 40.00    Pack years: 40.00    Types: Cigarettes  . Smokeless tobacco: Never Used  Substance and Sexual Activity  . Alcohol use: No    Alcohol/week: 0.0 oz  . Drug use: No  . Sexual activity: Not on file  Lifestyle  . Physical activity:    Days per week: Not on file    Minutes per session: Not on file  . Stress: Not on file  Relationships  . Social connections:    Talks on phone: Not on file    Gets together: Not on file    Attends religious service: Not on file    Active  member of club or organization: Not on file    Attends meetings of clubs or organizations: Not on file    Relationship status: Not on file  . Intimate partner violence:    Fear of current or ex partner: Not on file    Emotionally abused: Not on file    Physically abused: Not on file    Forced sexual activity: Not on file  Other Topics Concern  . Not on file  Social History Narrative  . Not on file    Family History  Problem Relation Age of Onset  . Prostate cancer Paternal Uncle   . Prostate cancer Maternal Grandfather   . Diabetes Mother   . Hypertension Mother   . Diabetes Father   . Heart failure Father     Past Medical History:  Diagnosis Date  . Anxiety   . Arthritis   . Chronic narcotic use   . Depression   . Diabetic peripheral neuropathy ([place])   . Hearing loss    wears bilateral hearing aids, bilat moderate SNHL  . Hyperlipidemia   . Hypertension   . Insomnia   . Tobacco abuse   . Type 1 diabetes mellitus ([place])   . Ulcer of left ankle ([place])   . Vision abnormalities    mild DM retinopathy    Past Surgical History:  Procedure Laterality Date  . COLONOSCOPY WITH PROPOFOL  07-26-2010  . [place] had 16 tons of steel rolled over his feet  . LUMBAR LAMINECTOMY/DECOMPRESSION MICRODISCECTOMY  12-23-1997   LEFT  L5 -- S1  . TONSILLECTOMY      Current Outpatient Medications  Medication Sig Dispense Refill  . amLODipine (NORVASC) 5 MG tablet     . aspirin 325 MG EC tablet Take 325 mg by mouth daily.    . benzonatate (TESSALON) 100 MG capsule Take 1 capsule by mouth as needed.     . divalproex (DEPAKOTE) 250 MG 24 hr tablet Take 250 mg by mouth daily.      Marland Kitchen doxazosin (CARDURA) 2 MG tablet Take 2 mg by mouth daily.    Marland Kitchen gabapentin (NEURONTIN) 300 MG capsule Take 600 mg by mouth 3 (three) times daily.      Marland Kitchen gemfibrozil (LOPID) 600 MG tablet Take 600 mg by mouth daily.      Marland Kitchen glipiZIDE (GLUCOTROL) 5 MG tablet Take 5 mg by mouth daily.      Marland Kitchen  HYDROcodone-acetaminophen (NORCO/VICODIN) 5-325 MG tablet Take 1 tablet by mouth 3 (three) times daily as needed.     . metFORMIN (GLUCOPHAGE) 500 MG tablet Take 500 mg by mouth 2 (two) times daily with a meal.     .  methadone (DOLOPHINE) 5 MG tablet Take 5 mg by mouth 3 (three) times daily.      . methocarbamol (ROBAXIN) 500 MG tablet Take 500 mg by mouth as needed. Muscle spasm     . metoprolol (LOPRESSOR) 50 MG tablet Take 50 mg by mouth. 2 pills qam, 1 pill qpm    . nortriptyline (PAMELOR) 25 MG capsule Take 25 mg by mouth at bedtime.      . ondansetron (ZOFRAN-ODT) 4 MG disintegrating tablet Take 4 mg by mouth daily.      . sertraline (ZOLOFT) 50 MG tablet Take 50 mg by mouth daily.      . simvastatin (ZOCOR) 20 MG tablet Take 20 mg by mouth at bedtime.      . diazepam (VALIUM) 5 MG tablet Take 5 mg by mouth as needed. anxiety      No current facility-administered medications for this visit.     Allergies as of 08/01/2017 - Review Complete 08/01/2017  Allergen Reaction Noted  . Codeine Nausea And Vomiting 04/25/2010  . Tetracyclines & related Hives 04/25/2010    Vitals: BP (!) 118/57   Pulse 62   Ht 5\' 10"  (1.778 m)   Wt 167 lb (75.8 kg)   BMI 23.96 kg/m  Last Weight:  Wt Readings from Last 1 Encounters:  08/01/17 167 lb (75.8 kg)   QQI:WLNL mass index is 23.96 kg/m.     Last Height:   Ht Readings from Last 1 Encounters:  08/01/17 5\' 10"  (1.778 m)    Physical exam:  General: The patient is awake, alert and appears not in acute distress. The patient is well groomed. Head: Normocephalic, atraumatic. Neck is supple. Mallampati 4,  neck circumference:16. Cardiovascular:  Regular rate and rhythm , without  murmurs or carotid bruit, and without distended neck veins. Respiratory: Lungs are clear to auscultation. Skin:  Without evidence of edema, or rash Trunk: BMI is 24. [name] posture is stooped.   Neurologic exam : The patient is awake and alert, oriented to  place and time.     Attention span & concentration ability appears normal.  Speech is fluent,  with dysarthria, and nasal speech dysphonia - not aphasia.  Mood and affect are appropriate.  Cranial nerves: Pupils : right pupil more briskly reactive to light. Left mild ptosis.  Funduscopic exam differed- evidence of pallor or edema. Extraocular movements  in vertical and horizontal planes intact and without nystagmus. Visual fields by finger perimetry are intact. Hearing impaired  -Facial sensation intact to fine touch.  Facial motor strength is symmetric and tongue and uvula move midline. Shoulder shrug was symmetrical.   Motor exam:   Normal tone, muscle bulk and symmetric strength in upper extremities. His right leg has normal muscle tone and bulk, decreased dorsal pedal pulses. The left leg has significant muscle atrophy- small , loss of popliteal pulses, loss of sensation. Loss of vibration , delayed wound healing.   Sensory:  Fine touch, pinprick and vibration were tested in upper extremities as normal . Proprioception tested in the upper extremities was normal.  Coordination: Rapid alternating movements in the fingers/hands was normal. Finger-to-nose maneuver  normal without evidence of ataxia, dysmetria or tremor.  Gait and station: Patient walks without assistive device. Shuffling gait, tendency to retropulsion. Strength left leg weaker than right, serious balance issues.    Deep tendon reflexes: in the upper  extremities are symmetric and intact. Lower extremities with loss of DTR,  Babinski maneuver response is equivocal- attenuated. Marland Kitchen  Reviewed images, hospitalization and labs, medications.    Assessment:  After physical and neurologic examination, review of laboratory studies,  Personal review of imaging studies, reports of other /same  Imaging studies, results of polysomnography and / or neurophysiology testing and pre-existing records as far as provided in visit., my  assessment is   1) Circadian rhythm disorder, poor sleep habits and hygiene. Polypharmacy.   2) snoring - apnea has been not witnessed.   3) insomnia may be related to chronic pain, pulmonary disease, central and peripheral vascular disease,    The patient was advised of the nature of the diagnosed disorder , the treatment options and the  risks for general health and wellness arising from not treating the condition.   I spent more than 60 minutes of face to face time with the patient.  Greater than 50% of time was spent in counseling and coordination of care. We have discussed the diagnosis and differential and I answered the patient's questions.    Plan:  Treatment plan and additional workup : 1) circadian rhythm- The couple joined a senior center last month, and hopes this will help to revert his circadian disorder. I will introduce the boot camp for insomnia patient.  2) chronic pain insomnia. May explain  PLms , RLS.  3) Sleep study to rule out organic sleep disorder.   Larey Seat, MD 09/14/5619, 3:08 PM  Certified in Neurology by ABPN Certified in [place] by [facility] [address] [place], [place] [address]

## 2017-08-07 ENCOUNTER — Telehealth: Payer: Self-pay

## 2017-08-07 ENCOUNTER — Other Ambulatory Visit: Payer: Self-pay | Admitting: Neurology

## 2017-08-07 DIAGNOSIS — G8929 Other chronic pain: Secondary | ICD-10-CM

## 2017-08-07 DIAGNOSIS — E1151 Type 2 diabetes mellitus with diabetic peripheral angiopathy without gangrene: Secondary | ICD-10-CM

## 2017-08-07 DIAGNOSIS — R0683 Snoring: Secondary | ICD-10-CM

## 2017-08-07 DIAGNOSIS — G4701 Insomnia due to medical condition: Secondary | ICD-10-CM

## 2017-08-07 NOTE — Telephone Encounter (Signed)
Order placed

## 2017-08-07 NOTE — Telephone Encounter (Signed)
Insurance has denied HST due to narcotic use. Please order in lab study, Thanks!!

## 2017-08-21 ENCOUNTER — Ambulatory Visit (INDEPENDENT_AMBULATORY_CARE_PROVIDER_SITE_OTHER): Payer: 59 | Admitting: Neurology

## 2017-08-21 DIAGNOSIS — E1151 Type 2 diabetes mellitus with diabetic peripheral angiopathy without gangrene: Secondary | ICD-10-CM

## 2017-08-21 DIAGNOSIS — G4731 Primary central sleep apnea: Secondary | ICD-10-CM

## 2017-08-21 DIAGNOSIS — F1120 Opioid dependence, uncomplicated: Secondary | ICD-10-CM

## 2017-08-21 DIAGNOSIS — G8929 Other chronic pain: Secondary | ICD-10-CM

## 2017-08-21 DIAGNOSIS — G4727 Circadian rhythm sleep disorder in conditions classified elsewhere: Secondary | ICD-10-CM

## 2017-08-21 DIAGNOSIS — Z720 Tobacco use: Secondary | ICD-10-CM

## 2017-08-21 DIAGNOSIS — R0683 Snoring: Secondary | ICD-10-CM

## 2017-08-21 DIAGNOSIS — G4701 Insomnia due to medical condition: Secondary | ICD-10-CM

## 2017-08-21 DIAGNOSIS — G4737 Central sleep apnea in conditions classified elsewhere: Secondary | ICD-10-CM

## 2017-08-21 DIAGNOSIS — F1190 Opioid use, unspecified, uncomplicated: Principal | ICD-10-CM

## 2017-08-24 NOTE — Addendum Note (Signed)
Addended by: Larey Seat on: 08/24/2017 04:37 PM   Modules accepted: Orders

## 2017-08-24 NOTE — Procedures (Signed)
PATIENT'S NAME:  [name] DOB:      July 22, 1955      MR#:    [id number]     DATE OF RECORDING: 08/21/2017 REFERRING M.D.:  Leanna Battles, MD Study Performed:   Baseline Polysomnogram HISTORY:  [name] is a 62 y.o. male patient with a speech impediment and congenital deafness, who presents with insomnia. He describes a profoundly discordant circadian rhythm, his Chief Complaint:" I no longer get up in the morning". His wife stated he snores.  He took Ambien in the past, was sleep walking under the influence of that drug and fell numerous times. He currently takes Doxepine 25 mg at night but believes this to cause memory impairment- he could not recall his social security number at check in, he begins sentences but doesn't finish them. Loses train of thought.  He goes to bed between 4 and 6 AM- there is a TV in the bedroom- and rises between 11 and 13 .00 hours. He eats, takes medications, and then falls asleep again until 2 PM but sometimes until 5-6 PM. He takes his dinner medication and then feels sleepy but experiences a second wind around 11 PM.  Mr. Arreola has a long list of medical diagnoses including chronic cigarette smoking ( not dx with COPD), chronic insomnia with poor sleep hygiene, bilateral severe peripheral arterial disease in both legs, diabetic and (PAD, PVD) vascular related neuropathy with foot ulcers and poorly healing wounds- painful, and treated with hyperbaric oxygen -recurrent in 2017, major depression, atypical chest pain, back pain, hyperlipidemia, retinopathy related to diabetes mellitus, hypogonadism, dry eyes and hypertension. Chronic pain in both legs.  The patient endorsed the Epworth Sleepiness Scale at 16/24 points.   [name] weight 167 pounds with a height of 70 (inches), resulting in a BMI of 24.1 kg/m2. [name] neck circumference measured 16 inches.  CURRENT MEDICATIONS: Norvasc, Aspirin, Tessalon, Depakote, Cardura, Neurontin, Lopiramide,  Glucotrol, Norco, Glucophage, Dolophine, Robaxin, Lopressor, Pamelor, Zofran, Zoloft, Zocor, Valium.   PROCEDURE:  This is a multichannel digital polysomnogram utilizing the Somnostar 11.2 system.  Electrodes and sensors were applied and monitored per AASM Specifications.   EEG, EOG, Chin and Limb EMG, were sampled at 200 Hz.  ECG, Snore and Nasal Pressure, Thermal Airflow, Respiratory Effort, CPAP Flow and Pressure, Oximetry was sampled at 50 Hz. Digital video and audio were recorded.      BASELINE STUDY: Lights Out was at 22:19 and Lights On at 05:02.  Total recording time (TRT) was 403.5 minutes, with a total sleep time (TST) of 287 minutes.   [name] sleep latency was 62.5 minutes.  REM latency was 0 minutes.  The sleep efficiency was 64.2 %.  The patient reached the threshold of AHI and hypoxemia to SPLIT but reportedly could not tolerate either CPAP interface ( type nasal and FFM ).      SLEEP ARCHITECTURE: WASO (Wake after sleep onset) was 53.5 minutes.  There were 7.5 minutes in Stage N1, 151 minutes Stage N2, 128.5 minutes Stage N3 and 0 minutes in Stage REM.  The percentage of Stage N1 was 2.9%, Stage N2 was 38.8%, Stage N3 was 58.3% and Stage R (REM sleep) was 0%.  RESPIRATORY ANALYSIS:  There were a total of 129 respiratory events:  18 obstructive apneas, 91 central apneas and 0 mixed apneas with a total of 109 apneas and an apnea index (AI) of 54. /hour. There were 20 hypopneas with a hypopnea index of 9.9 /hour. The patient  also had 0 respiratory event related arousals (RERAs). The total APNEA/HYPOPNEA INDEX (AHI) was 64.0/hour and the total RESPIRATORY DISTURBANCE INDEX was 64.0 /hour.  All 131 events in NREM. The REM AHI was 0.0/hour, versus a non-REM AHI of 64.0/h. The patient spent 287 minutes of total sleep time in the supine position and 0 minutes in non-supine. The supine AHI was 63.9 versus a non-supine AHI of 0.0.  OXYGEN SATURATION & C02:  The Wake baseline 02 saturation was  91%, with the lowest being 83%. Time spent below 89% saturation equaled 103 minutes.  The arousals were noted as: 60 were spontaneous, 0 were associated with PLMs, and 92 were associated with respiratory events.    PERIODIC LIMB MOVEMENTS:  The patient had a total of 0 Periodic Limb Movements.   Audio and video analysis did not show any abnormal or unusual movements, behaviors, phonations or vocalizations.  Sleep was very fragmented. Snoring was noted. Nocturia times 2. EKG was in keeping with normal sinus rhythm (NSR).  *Post-study, the patient indicated that sleep was better than usual and he was upset that the bedrooms had no TV. He also disliked the attempt to give him CPAP and could not tolerate either a nasal model (ESON) nor a FFM Simplus, stating he was claustrophobic.   IMPRESSION:  1. 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  2. Severe prolonged hypoxemia during sleep, Total time of 103 minutes.  3. Primary Snoring. Normal EKG. 4. No Periodic Limb Movement Disorder (PLMD),  which surprised me given his vasculopathy and neuropathy in both legs. 5. Circadian rhythm disorder- acquired. Sleep hygiene instructions still need to be followed.    RECOMMENDATIONS:  1. Advise full-night, attended, CPAP titration study to optimize therapy.  In order for this to be better tolerated I would like the patient to have an ENT evaluation for nasal patency and to undergo desensitization here at [facility].  2. His Severe Sleep Apnea is Complex and dominantly Central in origin, which cannot be treated by dental device or inspire technology. The best treatment is usually BiPAP or ASV, with or without oxygen.    I certify that I have reviewed the entire raw data recording prior to the issuance of this report in accordance with the Standards of Accreditation of the [place] Academy of Sleep Medicine (AASM)      Larey Seat, MD 08-24-2017 Diplomat of the  American  Board of Psychiatry and Neurology and the [employer] of [place] Director of [place] [facility] at [facility]

## 2017-08-27 DIAGNOSIS — H903 Sensorineural hearing loss, bilateral: Secondary | ICD-10-CM | POA: Diagnosis not present

## 2017-08-28 ENCOUNTER — Telehealth: Payer: Self-pay | Admitting: Neurology

## 2017-08-28 NOTE — Telephone Encounter (Signed)
-----   Message from Larey Seat, MD sent at 08/24/2017  4:37 PM EDT ----- #Post-study, the patient indicated that sleep was better than  usual and he was upset that the bedrooms had no TV. He also  disliked the attempt to give him CPAP and could not tolerate  either a nasal model (ESON) nor a FFM Simplus, stating he was  claustrophobic.   IMPRESSION:  1. Severe Complex Sleep Hypopnea / Apnea (CSA) dominantly central  apnea.  2. Severe prolonged hypoxemia during sleep, Total time of 103  minutes.  3. Primary Snoring. Normal EKG. 4. No Periodic Limb Movement Disorder (PLMD), which surprised me  given his vasculopathy and neuropathy in both legs. 5. Circadian rhythm disorder- acquired. Sleep hygiene  instructions still need to be followed.   RECOMMENDATIONS:  1. Advise full-night, attended, CPAP titration study to optimize  therapy. In order for this to be better tolerated I would like  the patient to have an ENT evaluation for nasal patency and to  undergo desensitization here at [facility].  2. His Severe Sleep Apnea is Complex and dominantly Central in  origin, which cannot be treated by dental device or Advertising copywriter.  The best treatment is usually BiPAP or ASV, with or  without oxygen.

## 2017-08-28 NOTE — Telephone Encounter (Signed)
I called pt. I advised pt that Dr. Brett Fairy reviewed their sleep study results and found that severe complex sleep apnea and recommends that pt be treated with a cpap/Bipap. Dr. Brett Fairy recommends that pt return for a repeat sleep study in order to properly titrate the cpap/Bipap and ensure a good mask fit. Pt is agreeable to first coming in for a densensitization mask fitting with robin and scheduled to come in Wed Aug 14,2019. Pt is agreeable to returning for a titration study. I advised pt that our sleep lab will file with pt's insurance and call pt to schedule the sleep study when we hear back from the pt's insurance regarding coverage of this sleep study. Pt verbalized understanding of results. Pt had no questions at this time but was encouraged to call back if questions arise.

## 2017-08-29 ENCOUNTER — Telehealth: Payer: Self-pay

## 2017-08-29 ENCOUNTER — Other Ambulatory Visit: Payer: Self-pay | Admitting: Neurology

## 2017-08-29 ENCOUNTER — Other Ambulatory Visit: Payer: Self-pay | Admitting: Internal Medicine

## 2017-08-29 DIAGNOSIS — R1310 Dysphagia, unspecified: Secondary | ICD-10-CM

## 2017-08-29 DIAGNOSIS — R0683 Snoring: Secondary | ICD-10-CM

## 2017-08-29 DIAGNOSIS — G4733 Obstructive sleep apnea (adult) (pediatric): Secondary | ICD-10-CM

## 2017-08-29 NOTE — Telephone Encounter (Signed)
ORDER PLACED

## 2017-08-29 NOTE — Telephone Encounter (Signed)
Patient came for desensitization mask fit with cpap. Patient is a mouth breather witnessed by his wife. I started him with Res Med Air Touch medium. He liked this mask ok. Second I tried a Dream wear medium full face mask with small head gear.He liked this mask much better.Had him lay on his back with cpap 7 for a few minutes. I explained the importance of getting his cpap titration done and being in the lab so we can adjust pressure and change modes of therapy if needed. I educated patient and wife on the process after titration was done and answered their questions. He feels comfortable coming in now and I got him scheduled for 09-16-17. Need an order for Cpap to Bipap if needed.

## 2017-09-04 ENCOUNTER — Ambulatory Visit
Admission: RE | Admit: 2017-09-04 | Discharge: 2017-09-04 | Disposition: A | Discharge status: Home / Self Care | Payer: Medicare Other | Source: Ambulatory Visit | Attending: Internal Medicine | Admitting: Internal Medicine

## 2017-09-04 DIAGNOSIS — R1310 Dysphagia, unspecified: Secondary | ICD-10-CM | POA: Diagnosis not present

## 2017-09-16 ENCOUNTER — Ambulatory Visit (INDEPENDENT_AMBULATORY_CARE_PROVIDER_SITE_OTHER): Payer: 59 | Admitting: Neurology

## 2017-09-16 DIAGNOSIS — F1190 Opioid use, unspecified, uncomplicated: Principal | ICD-10-CM

## 2017-09-16 DIAGNOSIS — G4701 Insomnia due to medical condition: Secondary | ICD-10-CM

## 2017-09-16 DIAGNOSIS — G4737 Central sleep apnea in conditions classified elsewhere: Secondary | ICD-10-CM

## 2017-09-16 DIAGNOSIS — G4727 Circadian rhythm sleep disorder in conditions classified elsewhere: Secondary | ICD-10-CM

## 2017-09-16 DIAGNOSIS — G4733 Obstructive sleep apnea (adult) (pediatric): Secondary | ICD-10-CM | POA: Diagnosis not present

## 2017-09-16 DIAGNOSIS — G8929 Other chronic pain: Secondary | ICD-10-CM

## 2017-09-16 DIAGNOSIS — R0683 Snoring: Secondary | ICD-10-CM

## 2017-09-16 DIAGNOSIS — F40240 Claustrophobia: Secondary | ICD-10-CM

## 2017-09-21 NOTE — Addendum Note (Signed)
Addended by: Larey Seat on: 09/21/2017 04:35 PM   Modules accepted: Orders

## 2017-09-21 NOTE — Procedures (Signed)
PATIENT'S NAME:  [name], [name] DOB:      1955-05-29      MR#:    [id number]     DATE OF RECORDING: 09/16/2017 REFERRING M.D.:  Leanna Battles, MD Study Performed:   Titration to positive airway pressure (CPAP and briefly BiPAP) HISTORY:  [name] is a 62 y.o. male patient with a speech impediment and congenital deafness, who presents with insomnia. He underwent a sleep study by [place], his PSG on 08/21/2017 documented complex sleep apnea -91 central apneas- a total AHI of 64.0/hour and all sleep in supine and in NREM - the baseline 02 saturation was 91%, with the lowest being 83%. Time spent below 89% saturation equaled 103 minutes. Sleep efficiency was just 64%.  He describes a profoundly discordant circadian rhythm, he snores, and he smokes. Mr. Chaput has a long list of medical diagnoses including chronic cigarette smoking (not dx with COPD), chronic insomnia with poor sleep hygiene, bilateral severe peripheral arterial disease in both legs (smoker's leg), diabetic and (PAD, PVD) vascular related neuropathy with foot ulcers and poorly healing wounds- painful, major depression, atypical chest pain, back pain, hyperlipidemia, retinopathy related to diabetes mellitus, and hypertension. The patient endorsed the Epworth Sleepiness Scale at 16/24 points.   [name] weight 168 pounds with a height of 70 (inches), resulting in a BMI of 24.1 kg/m2. [name] neck circumference measured 16 inches.  CURRENT MEDICATIONS: Norvasc, Aspirin, Tessalon, Depakote, Cardura, Neurontin, Lopid, Glucotrol, Norco, Glucophage, Dolophine, Robaxin, Lopressor, Pamelor, Zofran, Zoloft, Zocor, Valium.   PROCEDURE:  This is a multichannel digital polysomnogram utilizing the SomnoStar 11.2 system.  Electrodes and sensors were applied and monitored per AASM Specifications.   EEG, EOG, Chin and Limb EMG, were sampled at 200 Hz.  ECG, Snore and Nasal Pressure, Thermal Airflow, Respiratory Effort, CPAP Flow and  Pressure, Oximetry was sampled at 50 Hz. Digital video and audio were recorded.      CPAP was initiated at 5 cmH20 with heated humidity per AASM split night standards. I explicitly asked for a nasal interface which was provided and well tolerated ( Dreamwear nasal in medium)  but soon after the technologist noted change to a FFM due to " oral venting". CPAP pressure was advanced to14 cmH20 but did create central apneas- the patient was changed to BiPAP because of central apnea and desaturations. At none of the BiPAP pressures between 10/6 and 14/10 cm water was there a resolution, and the patient had only minutes of sleep time at the various pressure settings. He did best at 7 cmH20 CPAP for about 27 minutes of sleep time, there was a reduction of the AHI to 0.0 with improvement of sleep apnea but not of hypoxemia( nadir 84% Spo2).  Lights Out was at 21:54 and Lights On at 05:24. Total recording time (TRT) was 450 minutes, with a total sleep time (TST) of 183.5 minutes. [name] sleep latency was 183 minutes. REM latency was 0 minutes.  The sleep efficiency was 40.8 %.    SLEEP ARCHITECTURE: WASO (Wake after sleep onset) was 128.5 minutes.  There were 14 minutes in Stage N1, 162 minutes Stage N2, 7.5 minutes Stage N3 and 0 minutes in Stage REM.  The percentage of Stage N1 was 7.6%, Stage N2 was 88.3%, Stage N3 was 4.1% and Stage R (REM sleep) was 0%. The sleep architecture was notable for late sleep onset.    RESPIRATORY ANALYSIS:  There was a total of 127 respiratory events: 0 obstructive apneas,  127 central apneas and 0 hypopneas with 1 respiratory event related arousal (RERAs).     The total APNEA/HYPOPNEA INDEX (AHI) was 41.5 /hour and the total RESPIRATORY DISTURBANCE INDEX was 41.9 /hour. There was again no REM sleep and all 127 central events were in supine and in NREM sleep.   OXYGEN SATURATION & C02:  The baseline 02 saturation was 90%, with the lowest being 78%. Time spent below 89%  saturation equaled 170 minutes.  AROUSALS:  The arousals were noted as: 4 were spontaneous, 0 were associated with PLMs, and 2 were associated with respiratory events. There were no PLMs.  Audio and video analysis did not show any abnormal or unusual movements, behaviors, phonations or vocalizations. Some Snoring was noted. EKG was slow - in the 40s, but keeping with sinus rhythm.   DIAGNOSIS 1. Cyclic Respirations - Severe Central Sleep Apnea and Severe Sleep Related Hypoxemia were not alleviated- The patient did in comparison best under a nasal mask/ pillow by Dreamwear at CPAP of 7 cm water.  2. The patient was able to initiate sleep late- after 1 AM- this may just correlate to his at home rhythm.  3. BiPAP was therefore initiated after 4 AM- not enough time to titrate to best effects.  PLANS/RECOMMENDATIONS: The patient will return for either a PAP- Nap at 7 cm water pressure with use of the well tolerated nasal mask or a daytime titration sleep study. I will request oxygen titration parallel if indicated. The daytime study may give [place] more time with the patient being asleep, and we could explore BiPAP and ASV further.  *Oxygen may need to be titrated since we have documented failure to relieve hypoxemia by CPAP therapy.   DISCUSSION: Claustrophobia and deteriorated sleep habits have made CSA treatment very difficult. Please involve a behavior health specialist in Insomnia treatment.   A follow up appointment will be scheduled in the Sleep Clinic at [facility]. Please call [phone] with any questions.     I certify that I have reviewed the entire raw data recording prior to the issuance of this report in accordance with the Standards of Accreditation of the American Academy of Sleep Medicine (AASM)  Larey Seat, M.D.   09-21-2017  Diplomat, American Board of Psychiatry and Neurology  Diplomat, [place] of Sleep Medicine Medical Director, [place] [facility] at  [facility]

## 2017-09-24 DIAGNOSIS — G4733 Obstructive sleep apnea (adult) (pediatric): Secondary | ICD-10-CM | POA: Diagnosis not present

## 2017-09-24 DIAGNOSIS — H903 Sensorineural hearing loss, bilateral: Secondary | ICD-10-CM | POA: Diagnosis not present

## 2017-09-24 DIAGNOSIS — E108 Type 1 diabetes mellitus with unspecified complications: Secondary | ICD-10-CM | POA: Diagnosis not present

## 2017-09-25 ENCOUNTER — Telehealth: Payer: Self-pay

## 2017-09-25 ENCOUNTER — Other Ambulatory Visit: Payer: Self-pay | Admitting: Neurology

## 2017-09-25 ENCOUNTER — Telehealth: Payer: Self-pay | Admitting: Neurology

## 2017-09-25 DIAGNOSIS — G8929 Other chronic pain: Secondary | ICD-10-CM

## 2017-09-25 DIAGNOSIS — G4733 Obstructive sleep apnea (adult) (pediatric): Secondary | ICD-10-CM

## 2017-09-25 DIAGNOSIS — F99 Mental disorder, not otherwise specified: Secondary | ICD-10-CM

## 2017-09-25 DIAGNOSIS — R0683 Snoring: Secondary | ICD-10-CM

## 2017-09-25 DIAGNOSIS — G4701 Insomnia due to medical condition: Secondary | ICD-10-CM

## 2017-09-25 DIAGNOSIS — F5105 Insomnia due to other mental disorder: Secondary | ICD-10-CM

## 2017-09-25 NOTE — Telephone Encounter (Signed)
Order placed for the bipap titration.

## 2017-09-25 NOTE — Telephone Encounter (Signed)
Spoke with patients wife and explained we need to bring him back to sleep lab to continue the Bipap titration. We did not have enough sleep time to finish titration. We will bring him in at 3am and get him in bed at 4am since this is his regular sleep schedule. I will relieve the sleep tech that morning and finish titration. She said he would be willing to do this. Can  we get a Bipap order please?

## 2017-09-25 NOTE — Telephone Encounter (Signed)
Central sleep apnea comorbid with prescribed opioid use - Primary     Snoring     Insomnia secondary to chronic pain     Type 2 diabetes mellitus with diabetic peripheral angiopathy without gangrene, unspecified whether long term insulin use (HCC)     Circadian rhythm sleep disorder in conditions classified elsewhere     Tobacco abuse disorder     Opioid use disorder, moderate, on maintenance therapy, dependence ([place])      These are the associated diagnosis codes following his sleep study and why he needs a BiPAP., possible oxygen.   Larey Seat, MD

## 2017-09-26 NOTE — Progress Notes (Signed)
Cardiology Office Note   Date:  09/27/2017   ID:  Alan, Stevenson Apr 30, 1955, MRN [id number]  PCP:  Alan Battles, MD  Cardiologist:   No primary care provider on file.   Chief Complaint  Patient presents with  . Pre-op Exam      History of Present Illness: [name] is a 62 y.o. male who presents for preoperative clearance.  He said no cardiovascular history.  He does have diabetes since the 1990s.  He was seen in July 2015 by Dr. Gwenlyn Stevenson for evaluation of a foot ulcer.  He was not Stevenson to have peripheral vascular disease by his report and his wound healed conservatively.  He had previously been seen by Dr. Mare Stevenson years ago apparently preop and reports a negative perfusion study but I do not have this.  He is active and can drag garbage cans up an incline without bringing on any chest discomfort or shortness of breath.  He is limited because he had traumatic crush injuries to both of his feet years ago and he walks gingerly and with a bit of an unsteady gait because of that.  However, he still able be active.  He does not have any PND or orthopnea.  He has no palpitations, presyncope or syncope.  He is being evaluated for sleep apnea.   Past Medical History:  Diagnosis Date  . Anxiety   . Arthritis   . Chronic narcotic use   . Depression   . Diabetic peripheral neuropathy ([place])   . Hearing loss    wears bilateral hearing aids, bilat moderate SNHL  . Hyperlipidemia   . Hypertension   . Insomnia   . Tobacco abuse   . Type 1 diabetes mellitus ([place])   . Ulcer of left ankle ([place])   . Vision abnormalities    mild DM retinopathy    Past Surgical History:  Procedure Laterality Date  . COLONOSCOPY WITH PROPOFOL  07-26-2010  . [place] had 16 tons of steel rolled over his feet  . LUMBAR LAMINECTOMY/DECOMPRESSION MICRODISCECTOMY  12-23-1997   LEFT  L5 -- S1  . TONSILLECTOMY       Current Outpatient Medications  Medication Sig Dispense Refill  .  amLODipine (NORVASC) 5 MG tablet     . aspirin EC 81 MG tablet Take 81 mg by mouth daily.    . benzonatate (TESSALON) 100 MG capsule Take 1 capsule by mouth as needed.     . diazepam (VALIUM) 5 MG tablet Take 5 mg by mouth as needed. anxiety     . divalproex (DEPAKOTE) 250 MG 24 hr tablet Take 250 mg by mouth daily.      Marland Kitchen doxazosin (CARDURA) 2 MG tablet Take 2 mg by mouth daily.    Marland Kitchen gabapentin (NEURONTIN) 300 MG capsule Take 600 mg by mouth 3 (three) times daily.      Marland Kitchen gemfibrozil (LOPID) 600 MG tablet Take 600 mg by mouth daily.      Marland Kitchen glipiZIDE (GLUCOTROL) 5 MG tablet Take 5 mg by mouth daily.      Marland Kitchen HYDROcodone-acetaminophen (NORCO/VICODIN) 5-325 MG tablet Take 1 tablet by mouth 3 (three) times daily as needed.     . metFORMIN (GLUCOPHAGE) 500 MG tablet Take 500 mg by mouth 2 (two) times daily with a meal.     . methadone (DOLOPHINE) 5 MG tablet Take 5 mg by mouth 3 (three) times daily.      Marland Kitchen  methocarbamol (ROBAXIN) 500 MG tablet Take 500 mg by mouth as needed. Muscle spasm     . metoprolol (LOPRESSOR) 50 MG tablet Take 50 mg by mouth. 2 pills qam, 1 pill qpm    . nortriptyline (PAMELOR) 25 MG capsule Take 25 mg by mouth at bedtime.      . ondansetron (ZOFRAN-ODT) 4 MG disintegrating tablet Take 4 mg by mouth daily.      . sertraline (ZOLOFT) 50 MG tablet Take 50 mg by mouth daily.      . simvastatin (ZOCOR) 20 MG tablet Take 20 mg by mouth at bedtime.       No current facility-administered medications for this visit.     Allergies:   Codeine and Tetracyclines & related    Social History:  The patient  reports that he has been smoking cigarettes. He has a 40.00 pack-year smoking history. He has never used smokeless tobacco. He reports that he does not drink alcohol or use drugs.   Family History:  [name] family history includes Diabetes in his father and mother; Heart failure in his father; Hypertension in his mother; Prostate cancer in his maternal grandfather and paternal  uncle.    ROS:  Please see the history of present illness.   Otherwise, review of systems are positive for none.   All other systems are reviewed and negative.    PHYSICAL EXAM: VS:  BP (!) 136/54 (BP Location: Right Arm, Patient Position: Sitting, Cuff Size: Normal)   Pulse (!) 58   Ht 5\' 10"  (1.778 m)   Wt 167 lb (75.8 kg)   BMI 23.96 kg/m  , BMI Body mass index is 23.96 kg/m. GENERAL:  Well appearing HEENT:  Pupils equal round and reactive, fundi not visualized, oral mucosa unremarkable NECK:  No jugular venous distention, waveform within normal limits, carotid upstroke brisk and symmetric, no bruits, no thyromegaly LYMPHATICS:  No cervical, inguinal adenopathy LUNGS:  Clear to auscultation bilaterally BACK:  No CVA tenderness CHEST:  Unremarkable HEART:  PMI not displaced or sustained,S1 and S2 within normal limits, no S3, no S4, no clicks, no rubs, no murmurs ABD:  Flat, positive bowel sounds normal in frequency in pitch, no bruits, no rebound, no guarding, no midline pulsatile mass, no hepatomegaly, no splenomegaly EXT:  2 plus pulses throughout, no edema, no cyanosis no clubbing SKIN:  No rashes no nodules NEURO:  Cranial nerves II through XII grossly intact, motor grossly intact throughout PSYCH:  Cognitively intact, oriented to person place and time    EKG:  EKG is ordered today. The ekg ordered today demonstrates sinus rhythm, rate, axis within normal limits, intervals within normal limits, no acute ST-T wave changes.   Recent Labs: No results Stevenson for requested labs within last 8760 hours.    Lipid Panel No results Stevenson for: CHOL, TRIG, HDL, CHOLHDL, VLDL, LDLCALC, LDLDIRECT    Wt Readings from Last 3 Encounters:  09/27/17 167 lb (75.8 kg)  08/01/17 167 lb (75.8 kg)  07/15/14 179 lb 4 oz (81.3 kg)      Other studies Reviewed: Additional studies/ records that were reviewed today include: Labs. Review of the above records demonstrates:  Please see  elsewhere in the note.     ASSESSMENT AND PLAN:  PREOP:   By Ernestina Patches criteria the patient's cardiovascular risk is 0.9%.  This includes the fact that he is not going for a high risk procedure and has no high risk findings.  Therefore, based on ACC/AHA guidelines  no further cardiovascular testing is indicated.  I will take the liberty of ordering the labs in his chest x-ray as requested by Dr. Thornell Mule  TOBACCO:  We discussed this.  He is not ready to quit smoking.  DM: He reports that his A1c is 6.7.  He will continue with primary risk reduction follow-up per Alan Battles, MD   Current medicines are reviewed at length with the patient today.  The patient does not have concerns regarding medicines.  The following changes have been made:  no change  Labs/ tests ordered today include:   Orders Placed This Encounter  Procedures  . DG Chest 2 View  . CBC with Differential  . Comprehensive Metabolic Panel (CMET)  . PT and PTT  . EKG 12-Lead     Disposition:   FU with me as needed    Signed, Minus Breeding, MD  09/27/2017 3:20 PM    [facility]

## 2017-09-27 ENCOUNTER — Encounter: Payer: Self-pay | Admitting: Cardiology

## 2017-09-27 ENCOUNTER — Ambulatory Visit (INDEPENDENT_AMBULATORY_CARE_PROVIDER_SITE_OTHER): Payer: 59 | Admitting: Cardiology

## 2017-09-27 VITALS — BP 136/54 | HR 58 | Ht 70.0 in | Wt 167.0 lb

## 2017-09-27 DIAGNOSIS — Z794 Long term (current) use of insulin: Secondary | ICD-10-CM

## 2017-09-27 DIAGNOSIS — E119 Type 2 diabetes mellitus without complications: Secondary | ICD-10-CM | POA: Diagnosis not present

## 2017-09-27 DIAGNOSIS — Z01818 Encounter for other preprocedural examination: Secondary | ICD-10-CM

## 2017-09-27 NOTE — Patient Instructions (Signed)
Medication Instructions:  Your physician recommends that you continue on your current medications as directed. Please refer to the Current Medication list given to you today.   Labwork: Cmet, Cbc, Pt and PTT today  Testing/Procedures: A chest x-ray takes a picture of the organs and structures inside the chest, including the heart, lungs, and blood vessels. This test can show several things, including, whether the heart is enlarges; whether fluid is building up in the lungs; and whether pacemaker / defibrillator leads are still in place. ( To be done at [address] 1st floor)  Follow-Up: Your physician recommends that you schedule a follow-up appointment as needed   Any Other Special Instructions Will Be Listed Below (If Applicable).     If you need a refill on your cardiac medications before your next appointment, please call your pharmacy.

## 2017-09-28 ENCOUNTER — Telehealth: Payer: Self-pay

## 2017-09-28 LAB — CBC WITH DIFFERENTIAL/PLATELET
Basophils Absolute: 0 10*3/uL (ref 0.0–0.2)
Basos: 0 %
EOS (ABSOLUTE): 0.1 10*3/uL (ref 0.0–0.4)
EOS: 2 %
HEMATOCRIT: 36.7 % — AB (ref 37.5–51.0)
HEMOGLOBIN: 12.1 g/dL — AB (ref 13.0–17.7)
IMMATURE GRANULOCYTES: 0 %
Immature Grans (Abs): 0 10*3/uL (ref 0.0–0.1)
Lymphocytes Absolute: 1.7 10*3/uL (ref 0.7–3.1)
Lymphs: 31 %
MCH: 30 pg (ref 26.6–33.0)
MCHC: 33 g/dL (ref 31.5–35.7)
MCV: 91 fL (ref 79–97)
MONOCYTES: 6 %
MONOS ABS: 0.3 10*3/uL (ref 0.1–0.9)
NEUTROS PCT: 61 %
Neutrophils Absolute: 3.2 10*3/uL (ref 1.4–7.0)
Platelets: 173 10*3/uL (ref 150–450)
RBC: 4.03 x10E6/uL — AB (ref 4.14–5.80)
RDW: 14 % (ref 12.3–15.4)
WBC: 5.3 10*3/uL (ref 3.4–10.8)

## 2017-09-28 LAB — COMPREHENSIVE METABOLIC PANEL
ALK PHOS: 76 IU/L (ref 39–117)
ALT: 13 IU/L (ref 0–44)
AST: 13 IU/L (ref 0–40)
Albumin/Globulin Ratio: 2 (ref 1.2–2.2)
Albumin: 4.1 g/dL (ref 3.6–4.8)
BUN/Creatinine Ratio: 16 (ref 10–24)
BUN: 23 mg/dL (ref 8–27)
Bilirubin Total: 0.2 mg/dL (ref 0.0–1.2)
CO2: 30 mmol/L — AB (ref 20–29)
CREATININE: 1.42 mg/dL — AB (ref 0.76–1.27)
Calcium: 9.2 mg/dL (ref 8.6–10.2)
Chloride: 96 mmol/L (ref 96–106)
GFR calc Af Amer: 60 mL/min/{1.73_m2} (ref >59)
GFR calc non Af Amer: 52 mL/min/{1.73_m2} — ABNORMAL LOW (ref >59)
GLOBULIN, TOTAL: 2.1 g/dL (ref 1.5–4.5)
GLUCOSE: 259 mg/dL — AB (ref 65–99)
Potassium: 4.6 mmol/L (ref 3.5–5.2)
SODIUM: 139 mmol/L (ref 134–144)
Total Protein: 6.2 g/dL (ref 6.0–8.5)

## 2017-09-28 LAB — PT AND PTT
INR: 1 (ref 0.8–1.2)
PROTHROMBIN TIME: 10.6 s (ref 9.1–12.0)
aPTT: 25 s (ref 24–33)

## 2017-09-28 NOTE — Telephone Encounter (Signed)
Dr.Hochrein's pre-ov notes fwd to Dr.Eric Thornell Mule

## 2017-10-03 ENCOUNTER — Other Ambulatory Visit: Payer: Self-pay | Admitting: Otolaryngology

## 2017-10-03 DIAGNOSIS — H903 Sensorineural hearing loss, bilateral: Secondary | ICD-10-CM

## 2017-10-09 ENCOUNTER — Other Ambulatory Visit: Payer: Medicare Other

## 2017-10-09 ENCOUNTER — Ambulatory Visit (INDEPENDENT_AMBULATORY_CARE_PROVIDER_SITE_OTHER): Payer: 59 | Admitting: Neurology

## 2017-10-09 DIAGNOSIS — F5105 Insomnia due to other mental disorder: Secondary | ICD-10-CM

## 2017-10-09 DIAGNOSIS — F1190 Opioid use, unspecified, uncomplicated: Secondary | ICD-10-CM

## 2017-10-09 DIAGNOSIS — R0683 Snoring: Secondary | ICD-10-CM

## 2017-10-09 DIAGNOSIS — F99 Mental disorder, not otherwise specified: Secondary | ICD-10-CM

## 2017-10-09 DIAGNOSIS — G4737 Central sleep apnea in conditions classified elsewhere: Secondary | ICD-10-CM

## 2017-10-09 DIAGNOSIS — G4731 Primary central sleep apnea: Secondary | ICD-10-CM

## 2017-10-09 DIAGNOSIS — G4733 Obstructive sleep apnea (adult) (pediatric): Secondary | ICD-10-CM

## 2017-10-09 DIAGNOSIS — G8929 Other chronic pain: Secondary | ICD-10-CM

## 2017-10-09 DIAGNOSIS — G4701 Insomnia due to medical condition: Secondary | ICD-10-CM

## 2017-10-11 ENCOUNTER — Ambulatory Visit
Admission: RE | Admit: 2017-10-11 | Discharge: 2017-10-11 | Disposition: A | Discharge status: Home / Self Care | Payer: Medicare Other | Source: Ambulatory Visit | Attending: Cardiology | Admitting: Cardiology

## 2017-10-11 ENCOUNTER — Ambulatory Visit
Admission: RE | Admit: 2017-10-11 | Discharge: 2017-10-11 | Disposition: A | Discharge status: Home / Self Care | Payer: Medicare Other | Source: Ambulatory Visit | Attending: Otolaryngology | Admitting: Otolaryngology

## 2017-10-11 ENCOUNTER — Inpatient Hospital Stay
Admission: RE | Admit: 2017-10-11 | Discharge: 2017-10-11 | Disposition: A | Discharge status: Home / Self Care | Payer: Medicare Other | Source: Ambulatory Visit | Attending: Otolaryngology | Admitting: Otolaryngology

## 2017-10-11 DIAGNOSIS — H903 Sensorineural hearing loss, bilateral: Secondary | ICD-10-CM | POA: Diagnosis not present

## 2017-10-11 DIAGNOSIS — Z01818 Encounter for other preprocedural examination: Secondary | ICD-10-CM | POA: Diagnosis not present

## 2017-10-11 MED ORDER — IOPAMIDOL (ISOVUE-300) INJECTION 61%
75.0000 mL | Freq: Once | INTRAVENOUS | Status: AC | PRN
  Administered 2017-10-11: 75 mL via INTRAVENOUS

## 2017-10-14 DIAGNOSIS — G4731 Primary central sleep apnea: Secondary | ICD-10-CM | POA: Insufficient documentation

## 2017-10-14 DIAGNOSIS — G4737 Central sleep apnea in conditions classified elsewhere: Secondary | ICD-10-CM | POA: Insufficient documentation

## 2017-10-14 DIAGNOSIS — F1190 Opioid use, unspecified, uncomplicated: Secondary | ICD-10-CM

## 2017-10-14 NOTE — Procedures (Signed)
PATIENT'S NAME:  Melissa, Pulido DOB:      [birth year]      MR#:    [id number]     DATE OF RECORDING: 10/10/2017 [place] REFERRING M.D.:  Leanna Battles, MD Study Performed:   Titration to Positive Airway Pressure  HISTORY:  JAEVIAN SHEAN is a 62 y.o. male patient with a speech impediment and congenital deafness, who presents with insomnia. Patient returning for a BIPAP Titration sleep study scored at 4%, after haing a CPAP Titration sleep study on 09/16/17. CPAP did not alleviate centrals and hypoxemia. There were 127 respiratory events: 0 obstructive apneas, 127 central apneas and 0 hypopneas . The total APNEA/HYPOPNEA INDEX (AHI) was 41.5 /hour and the total RESPIRATORY DISTURBANCE INDEX was 41.9 /hour. There was again no REM sleep and all 127 central events were in supine and in NREM sleep.  He underwent a baselione sleep study by [facility], his PSG on 08/21/2017 documented complex sleep apnea -91 central apneas- a total AHI of 64.0/hour and all sleep in supine and in NREM - the baseline 02 saturation was 91%, with the lowest being 83%. Time spent below 89% saturation equaled 103 minutes. Sleep efficiency was just 64%.  The patient endorsed the Epworth Sleepiness Scale at 16/24 points.   [name] weight 168 pounds with a height of 70 (inches), resulting in a BMI of 24.1 kg/m2. [name] neck circumference measured 16 inches.  CURRENT MEDICATIONS: Norvasc, Aspirin, Tessalon, Depakote, Cardura, Neurontin, Lopid, Glucotrol, Norco, Glucophage, Dolophine, Robaxin, Lopressor, Pamelor, Zofran, zoloft, Zocor, Valium.  PROCEDURE:  This is a multichannel digital polysomnogram utilizing the Somnostar 11.2 system.  Electrodes and sensors were all applied and monitored per AASM Specifications.   EEG, EOG, Chin and Limb EMG were all sampled at 200 Hz.  ECG, Snore and Nasal Pressure, Thermal Airflow, Respiratory Effort, CPAP Flow and Pressure, Oximetry was sampled at 50 Hz. Digital video and audio were  recorded.      CPAP was initiated at 5.0 cmH20 with heated humidity and changed to BiPAP at 8/4 cm water after patient was finally asleep.  BiPAP pressure was advanced to 18/12/cmH20 because of centrals, hypopneas, apneas and desaturations.  The patient could not tolerate any FFM, and chinstrap with Dreamwear nasal mask in medium was used. The technician added ST- at a backup rate of 10 and 12. At a pressure of 18/12 cm water with back up rate at 10 breath per minute, there was a reduction of the AHI to 18.0 with some improvement of sleep apnea. Hypoxemia did never resolve and oxygen was not added, as apneas were still present.   Lights Out was at 3:45 and Lights On at 10:17. Total recording time (TRT) was 384 minutes, with a total sleep time (TST) of 286 minutes. [name] sleep latency was 27 minutes. REM latency was 354.5 minutes.  The sleep efficiency was 74.5 %.    SLEEP ARCHITECTURE: WASO (Wake after sleep onset) was 70 minutes.  There were 8.5 minutes in Stage N1, 203.5 minutes Stage N2, 72.5 minutes Stage N3 and * 1.5 minutes in Stage REM.  The percentage of Stage N1 was 3.0%, Stage N2 was 71.2%, Stage N3 was 25.3% and Stage R (REM sleep) was .5%. The sleep architecture was notable for ongoing cyclic breathing, central dominant apneas. The arousals were noted as: 46 were spontaneous,0 were associated with PLMs, and 52 remained associated with respiratory events.  RESPIRATORY ANALYSIS:  There was a total of 321 respiratory events: 16 obstructive apneas,  259 central apneas and 0 mixed apneas with a total of 275 apneas and an apnea index (AI) of 57.7 /hour. There were 46 hypopneas with a hypopnea index of 9.7/hour.  The total APNEA/HYPOPNEA INDEX (AHI) was 67.3 /hour and the total RESPIRATORY DISTURBANCE INDEX was 67.3 /hour.  The REM AHI was 0.0 /hour versus a non-REM AHI of 67.7 /hour.  The patient spent all 286 minutes of sleep time in the supine position.  The supine AHI was 67.4, versus a  non-supine AHI of 0.0.  OXYGEN SATURATION & C02:  The baseline 02 saturation was 84%, with the lowest being 76%. Time spent below 89% saturation equaled 265 minutes.  0 Periodic Limb Movements. Audio and video analysis did not show any abnormal or unusual movements, behaviors, phonations or vocalizations.  EKG was in keeping with sinus rhythm, with sinus bradycardia.  DIAGNOSIS : 1. 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 Central apnea emerged under CPAP and BiPAP and only improved marginally under BiPAP ST. Again, an optimal pressure was not found.  3. EKG with Bradycardia, sinus rhythm.    PLANS/RECOMMENDATIONS: The patient had undergone a desensitization session here at [facility] and was able to use a FFM during that visit. He did not allow the night -time tech to place this mask on him during this titration schedule.  We also arranged for a 2 AM arrival time to mimic this patient's circadian rhythm.   I will again ask his referring physician to arrange for an insomnia and sleep hygiene counseling with behavior health specialist.  We can offer another return to ASV or even higher BiPAP settings with ST at 10/min. I cannot guarantee that this will improve this patient's central apnea and many sleep related dysfunction.   The patient should avoid evening sedatives, tobacco, hypnotics, narcotics and alcoholic beverages before bedtime, as applicable.   DISCUSSION: Post-study, the patient indicated that sleep was "OK". The patient was fitted with a Dreamwear medium nasal cradle mask and a chinstrap.  If the Patient and Dr. Philip Aspen agree, I prefer to refer and have a pulmonology specialist evaluate this patient.  We can also offer yet another titration advancing to BiPAP 19/13 cm with 10 ST setting and possible change to ASV, if needed with additional oxygen.   A follow up appointment can be scheduled in the Sleep Clinic at [facility].   Please call [phone] with any questions.      I certify that I have reviewed the entire raw data recording prior to the issuance of this report in accordance with the Standards of Accreditation of the American Academy of Sleep Medicine (AASM)     Larey Seat, M.D.     10-14-2017  Diplomat, American Board of Psychiatry and Neurology  Diplomat, [place] of [facility] Medicine Medical Director, [place] [facility]

## 2017-10-16 ENCOUNTER — Telehealth: Payer: Self-pay

## 2017-10-16 DIAGNOSIS — J439 Emphysema, unspecified: Secondary | ICD-10-CM

## 2017-10-16 DIAGNOSIS — H9190 Unspecified hearing loss, unspecified ear: Secondary | ICD-10-CM

## 2017-10-16 DIAGNOSIS — F1190 Opioid use, unspecified, uncomplicated: Secondary | ICD-10-CM

## 2017-10-16 DIAGNOSIS — G4734 Idiopathic sleep related nonobstructive alveolar hypoventilation: Secondary | ICD-10-CM

## 2017-10-16 DIAGNOSIS — G4737 Central sleep apnea in conditions classified elsewhere: Secondary | ICD-10-CM

## 2017-10-16 NOTE — Telephone Encounter (Signed)
I called pt to discuss his sleep study results. Someone picked up the phone but then we were disconnected.  I called again, spoke to pt's wife, [name], per DPR. I explained her sleep study results. I advised her that Dr. Brett Fairy would prefer that Dr. Philip Aspen set pt up with a pulmonologist. Pt's wife is wondering why a pulmonologist would need to get involved if pt has central sleep apnea if this is a condition treated by a neurologist since the central apnea is caused by his brain. I have sent a copy of pt's results to Dr. Philip Aspen.  Pt's wife, [name], is asking for a call from Dr. Brett Fairy. She can be reached at [phone].

## 2017-10-16 NOTE — Telephone Encounter (Signed)
-----   Message from Larey Seat, MD sent at 10/14/2017  4:07 PM EDT ----- This daytime adjusted study allowed [place] to reach a better sleep efficiency at 64% , a partial treatment of central cyclic breathing at BiPAP ST- settings of 18/ 12 cm water and back up rate of 10 /min. AHI was still 18.0/h- far from resolved, hypoxemia persisted.  As outlined in my BiPAP study result and A/ P, there is only higher BiPAP to try ( really only 3 settings remaining to be tried ) ., or ASV, or non invasive assisted respiration-  I would like to defer to pulmonology for assisted respiration technology.   I will ask Dr Philip Aspen hereby if he has any preference for returning here or referral to Pulmonology. Larey Seat, MD

## 2017-10-16 NOTE — Telephone Encounter (Signed)
  The patient is scheduled for a cochlear implant mid October.  His wife hopes that this will make the communication with the patient easier.  I explained to her would be just recently learned about an auto ventilation device, which could help a COPD patient with central apnea.  I know that our pulmonology colleagues have prescribed this trilogy device before.  However she would like to wait until we have a similar device available which should be later this month and we will invite [name] to try the machine here, it will be set to auto titration he does not have to spend the whole night here, his visit in the sleep lab would be more of a fitting for mask and to make an familiar with the mechanism of the device.

## 2017-10-24 ENCOUNTER — Encounter (HOSPITAL_BASED_OUTPATIENT_CLINIC_OR_DEPARTMENT_OTHER): Payer: Self-pay | Admitting: *Deleted

## 2017-10-24 ENCOUNTER — Other Ambulatory Visit: Payer: Self-pay

## 2017-10-26 NOTE — H&P (Signed)
[name] is an 62 y.o. male.   Chief Complaint: Profound bilateral sensorineural hearing loss beyond hearing aids  HPI: See H&P Below  History & Physical Examination   Patient:  [name]  Date of Birth: 1955/07/15  Provider: Vicie Mutters, MD, MS, FACS  Date of Service:  Oct 25, 2017  Location: [facility], [place], [place]                  [place], [place]   [id number]                                Ph: [phone], Fax: [phone]                  [email or web address]     Provider: Vicie Mutters, MD, MS, FACS Encounter Date: Oct 25, 2017 Patient: [name], [name]   ([id number]) Sex: Male DOB: [birth year]     Age: 58 Year 9 Month 1 Week  Race: Rauth      Address: [address] [place] [address]     Pref. Phone(H): [phone] Primary Dr.: Leanna Battles Insurance(s):  [employer] [phone]) (PP)   Referred By:  Vicie Mutters   [place] CT: Type: procedure  code: [id number]  desc:Documentation of current medications (procedure)  Visit Type: Canyon Lohr, a 73 year 31 month 1 week [place] male is here today for a pre-operative visit.  Complaint/HPI: The patient was here today with his wife for a preoperative evaluation prior to undergoing a left MedEL Synchrony2 Mi1250 + Flex 28 cochlear implant. The patient does not report any recent upper respiratory tract infection, cough, or fever.  Previous history: The patient was here today with his wife, Irie Fiorello, a hearing aid dealer and fitter, for an evaluation of progressive hearing loss and for a cochlear implant evaluation. The patient was born prematurely and was exposed to some type of antibiotic that led to hearing loss. I do not know if he received aminoglycosides. He was diagnosed with hearing loss at age 67 and underwent speech and language therapy during school. He has been a long-term hearing aid user and is currently wearing Widex hearing aids  that are approximately three years old. He has been having more and more difficulty understanding speech and is unable to hear in background noise. He uses a loop for TV, and connectivity is very important to him. He denies any major family history of hearing loss although his father wore hearing aids toward the end of his life. He denied otorrhea, otalgia, tinnitus or vertigo. He has not experienced childhood ear infections.  The patient recently underwent [place] bio testing on August 27, 2017 by [facility]. See her report. He did poorly in noise.  He was here today to explore candidacy for a cochlear implant.  Patient has multiple medical problems including Type I. Diabetes mellitus and is on insulin and metformin. He has some diabetic retinopathy, lower back pain, a diabetic ulcer in 2015, peripheral arterial disease diagnosed in 2017, and chronic cigarette smoking, and some sleep apnea. He is being fit with BiPAP. He has had Prevnar 13 and pneumococcal vaccines.   Current Medication: Other MD:  1. Doxepin 25 Mg Capsule  2. Hydrocodone-acetaminophen 5-325 Mg Tablet  3. Glipizide Er  2.5 Mg Tablet  4. Hydrochlorothiazide 25 Mg Tab  5. Metoprolol Tartrate 25 Mg Tab  6. Methadone Hcl 5 Mg Tablet  7. Nortriptyline Hcl 25 Mg Cap  8. Gabapentin   Cap 300mg   9. Simvastatin  Tab 20mg   10. Amlodipine Besylate 5 Mg Tab  11. Basaglar     Inj 100unit  12. Losartan Potassium 100 Mg Tab  13. Bd Uf Mini Pen Needle 30mmx31g  14. Aspirin Ec 81 Mg Tablet  15. Viagra 100 Mg Tablet  16. Zofran 4 Mg Tablet   Medical History: Vaccinations: Flu vaccinations: Yes, flu shot - since Oct 16, 2016 - code [phone]. Pneumonia vaccination: Yes - code 4040F.  Surgical History: Prior surgeries include foot surgery and Back Sx.  Family History: The patient has a family history of Diabetes mellitus and Hearing loss.  Social History: No Traveled outside [place] in past 21 days? Adult. Smoking: His current smoking  status is current every day smoker. He smokes 2 packs a day. Alcohol: Patient denies drinking alcoholic beverages. Marital Status: Patient is married. HIV status: (-) HIV status. Military History: No. Noise exposure: Noise exposure:. Occupation: Currently retired. Recreational Drug Use: He denies recreational drug use.  Allergy: Codeine, Tetracyclines, Tigecycline  ROS: General: (-) fever, (-) chills, (-) night sweats, (-) fatigue, (-) weakness, (-) changes in appetite or weight. (-) allergies, (-) not immunocompromised. Head: (-) headaches, (-) head injury or deformity. Eyes: (+) glasses or contacts, (+) glaucoma. Cardiac: (+) circulation problems, (+) high blood pressure. Respiratory: (-) cough, (-) hemoptysis, (-) shortness of breath, (-) cyanosis, (-) wheezing, (-) nocturnal choking or gasping, (-) TB exposure. Gastrointestinal: (-) abdominal pain, (-) heartburn, (-) constipation, (-) diarrhea, (-) nausea, (-) vomiting, (-) hematochezia, (-) melena, (-) change in bowel habits. Urinary: (-) dysuria, (-) frequency, (-) urgency, (-) hesitancy, (-) polyuria, (-) nocturia, (-) hematuria, (-) urinary incontinence, (-) flank pain, (-) change in urinary habits. Gynecologic/Urologic: (-) genital sores or lesions, (-) history of STD, (-) sexual difficulties. Musculoskeletal: (+) arthritis. Peripheral Vascular: (-) intermittent claudication, (-) cramps, (-) varicose veins, (-) thrombophlebitis. Neurological: (-) numbness, (-) tingling, (-) tremors, (-) seizures, (-) vertigo, (-) dizziness, (-) memory loss, (-) any focal or diffuse neurological deficits. Psychiatric: (-) anxiety, (-) depression, (-) sleep disturbance, (-) irritability, (-) mood swings, (-) suicidal thoughts or ideations. Endocrine: (+) diabetes mellitus. Hematologic/Lymphatic: (-) anemia, (-) easy bruising, (-) excessive bleeding, (-) history of blood transfusions. Skin: (-) rashes, (-) lumps, (-) itching, (-) dryness, (-) acne,  (-) discoloration, (-) recurrent skin infections, (-) changes in hair, nails or moles.  Vital Signs: Weight:   75.07 kgs Height:   5\' 10"  BMI:   23.74 BSA:   1.92 BP:   155/60(Right Arm)(Sitting), Active Diagnosis  Examination: Prior to the examination, I have reviewed: (1) the patient's current medications and allergies, (2) medical, family, and social histories, (3) review of systems, and (4) vital signs.  General Appearance - Adult: The patient is a well-developed, well-nourished, male, has no recognizable syndromes or patterns of malformation, and is in no acute distress. He is awake, alert, coherent, spontaneous, and logical. He is oriented to time, place, and person and communicates without difficulty.  Head: [name] head was normocephalic and without any evidence of trauma or lesions.  Face: His facial motion was intact and symmetric bilaterally with normal resting facial tone and voluntary facial power.  Skin: Gross inspection of his facial skin demonstrated no evidence of abnormality.  Eyes: His pupils are equal, regular, reactive  to light and accommodate (PERRLA). Extraocular movements were intact (EOMI). Conjunctivae were normal. There was no sclera icterus. There was no nystagmus. Eyelids appeared normal. There was no ptosis, lid lag, lid edema, or lagophthalmos.  External ears: Both of his external ears were normal in size, shape, angulation, and location.  External auditory canals: Examination of the external auditory canals revealed a cerumen impaction of both EAC's.  Procedure: Cerumen Removal - Using the microscope and microinstruments, cerumen impactions were removed atraumatically from both EAC's. Both canals were stable and without signs of infection. The patient tolerated the procedure well.  Right Tympanic Membrane: The right tympanic membrane was clear and mobile. There was an air containing right middle ear space.  Left Tympanic Membrane: The left tympanic  membrane was clear and mobile. There was an air containing left middle ear space.  Audiology Procedures: Audiogram/Graph Audiogram: I have referred the patient for audiometric testing. I have reviewed the patient's audiogram. (EMK).  Procedure:  Patient was placed in a special soundproof testing booth with earphones. Sounds were passed through the earphones. Each ear was tested separately. The patient was asked which part of the ear sound was heard. Earphones were removed, bone conduction hearing was then tested by having a vibrating instrument held close against the patient's ear. Audiometric testing from 10-04-17: The patient was found to have severe to profound bilateral sensorineural hearing loss with a little bit of low-frequency hearing, right ear greater than left ear with SRTs of 85 DB AD with 12% discrimination and an SRT of 100 DB AS with 8% discrimination.  Impression: Other:  1. Severe to profound bilateral sensorineural hearing loss, left ear greater than right ear, with very poor discrimination ability. Patient did poorly on [place] and [place] bio testing, particularly in noise. He is currently beyond hearing aids. 2. Multiple medical problems including Type I. Diabetes mellitus, chronic pain, chronic smoking history, peripheral vascular disease, and sleep apnea. 3. Polypharmacy. 4. The patient has had a CT scan of his temporal bones with contrast on 10-12-17 that showed normal cochleae and preoperative anesthesia clearance. Patient is left-handed and his left ear is the poor hearing ear. Therefore, I would recommend a left MedEL Synchrony2 Mi1250 + Flex 28 cochlear implant AS, four hours, general anesthesia, [facility], with a 23 hour overnight observation. Patient is an insulin-dependent diabetic and will require the overnight stay. 5. Risks, complications, and alternatives of the procedure were read by the patient when he read our cochlear implant informed consent. The consent has  been witnessed and signed by his wife. Patient was given a copy of the informed consent form. Questions were invited and answered. Preoperative teaching and counseling were provided. 5. Continue to wear his Widex hearing aid AD. Connectivity will be important for him going forward.  Plan: Clinical summary letter made available to patient today. This letter may not be complete at time of service. Please contact our office within 3 days for a completed summary of today's visit.  Status: stable. Medications: None required.  Diet: no restriction. Procedure: Cochlear implant: Left ear. Duration:  4 hours. Surgeon: Fannie Knee MD Office Phone: [phone] Office Fax: [phone] Cell Phone: [phone]. Anesthesia Required: [place]: yes. [place] Allergy: no . MedEL Synchrony2 Mi1250 + Flex 28 electrode.  Informed consent: Informed consent for a cochlear implant was provided. The consent was provided in an office examination room. The patient wife was given our written Cochlear Implant Informed Consent to read  because they could not hear. The recommended procedure(s) was/were explained to the patient wife. Advantages, disadvantages, and options were reviewed, including doing nothing. Questions were invited and answered. Preoperative teaching and counseling were provided. The time and date of the procedure were reviewed. No guarantees were implied or made that cochlear implantation would be able to provide any hearing or useful hearing. Informed consent - status: Informed consent was provided and was signed and witnessed.  Recommendations: The patient should wear a hearing aid in the right ear. Follow-Up: .  Diagnosis: H90.3  Sensorineural hearing loss, bilateral  G47.33  Obstructive sleep apnea (adult) (pediatric)  E10.8  Type 1 diabetes mellitus with unspecified complications   Careplan: (1) Hearing Loss (2) Postop Tympanomastoid/cochlear  implant  Followup: Postop visit   This visit note has been electronically signed off by Vicie Mutters, MD, MS, FACS on 10/26/2017 at 01:17 PM.       Next Appointment: 10/31/2017 at 08:30 AM     Past Medical History:  Diagnosis Date  . Anxiety   . Arthritis   . Chronic narcotic use   . Complication of anesthesia    h/o aspiration during back surgery  . Depression   . Diabetic peripheral neuropathy ([place])   . Hearing loss    wears bilateral hearing aids, bilat moderate SNHL  . Hyperlipidemia   . Hypertension   . Insomnia   . Tobacco abuse   . Type 1 diabetes mellitus ([place])   . Ulcer of left ankle ([place])   . Vision abnormalities    mild DM retinopathy    Past Surgical History:  Procedure Laterality Date  . COLONOSCOPY WITH PROPOFOL  07-26-2010  . [place] had 16 tons of steel rolled over his feet  . LUMBAR LAMINECTOMY/DECOMPRESSION MICRODISCECTOMY  12-23-1997   LEFT  L5 -- S1  . TONSILLECTOMY      Family History  Problem Relation Age of Onset  . Prostate cancer Paternal Uncle   . Prostate cancer Maternal Grandfather   . Diabetes Mother   . Hypertension Mother   . Diabetes Father   . Heart failure Father    Social History:  reports that he has been smoking cigarettes. He has a 40.00 pack-year smoking history. He has never used smokeless tobacco. He reports that he does not drink alcohol or use drugs.  Allergies:  Allergies  Allergen Reactions  . Codeine Nausea And Vomiting  . Tetracyclines & Related Hives    No medications prior to admission.    No results found for this or any previous visit (from the past 48 hour(s)). No results found.  Review of Systems  Constitutional: Negative.   HENT: Positive for hearing loss.   Eyes: Negative.   Respiratory: Negative.   Cardiovascular: Negative.   Gastrointestinal: Negative.   Genitourinary: Negative.   Musculoskeletal: Negative.   Skin: Negative.   Endo/Heme/Allergies: Negative.      Height 5\' 10"  (1.778 m), weight 75.8 kg. Physical Exam   Assessment/Plan 1.  Profound bilateral sensorineural hearing loss beyond hearing aids 2. Type I diabetes mellitus 3. Sleep apnea, chronic smoking, peripheral vascular disease 4. The patient has been recommended for a Left MedEL Synchrony2 Mi1250 + Flex 28 electrode cochlear implant, four hours, general anesthesia, [facility], with a 23 hour overnight observation. Risks, complications, and alternatives of cochlear implantation have been explained to the patient and to his wife. The patient has read and sign our cochlear implant informed consent form  and has been given a copy of the form. Questions have been invited and answered. Informed consent has been signed and witnessed. Preoperative teaching and counseling have been provided. The patient understands that there are no guarantees that he will be able to gain a hearing sensation from the device. 5. The procedure is scheduled for Wednesday, October 31, 2017 at 8:30 AM, [place].  Vicie Mutters, MD 10/26/2017, 4:37 PM

## 2017-10-29 ENCOUNTER — Encounter (HOSPITAL_BASED_OUTPATIENT_CLINIC_OR_DEPARTMENT_OTHER)
Admission: RE | Admit: 2017-10-29 | Discharge: 2017-10-29 | Disposition: A | Discharge status: Home / Self Care | Payer: 59 | Source: Ambulatory Visit | Attending: Otolaryngology | Admitting: Otolaryngology

## 2017-10-29 DIAGNOSIS — G8929 Other chronic pain: Secondary | ICD-10-CM | POA: Diagnosis not present

## 2017-10-29 DIAGNOSIS — Z885 Allergy status to narcotic agent status: Secondary | ICD-10-CM | POA: Diagnosis not present

## 2017-10-29 DIAGNOSIS — I10 Essential (primary) hypertension: Secondary | ICD-10-CM | POA: Diagnosis not present

## 2017-10-29 DIAGNOSIS — Z794 Long term (current) use of insulin: Secondary | ICD-10-CM | POA: Diagnosis not present

## 2017-10-29 DIAGNOSIS — Z881 Allergy status to other antibiotic agents status: Secondary | ICD-10-CM | POA: Diagnosis not present

## 2017-10-29 DIAGNOSIS — Z79899 Other long term (current) drug therapy: Secondary | ICD-10-CM | POA: Diagnosis not present

## 2017-10-29 DIAGNOSIS — H903 Sensorineural hearing loss, bilateral: Secondary | ICD-10-CM | POA: Insufficient documentation

## 2017-10-29 DIAGNOSIS — F17210 Nicotine dependence, cigarettes, uncomplicated: Secondary | ICD-10-CM | POA: Diagnosis not present

## 2017-10-29 DIAGNOSIS — M1990 Unspecified osteoarthritis, unspecified site: Secondary | ICD-10-CM | POA: Diagnosis not present

## 2017-10-29 DIAGNOSIS — F329 Major depressive disorder, single episode, unspecified: Secondary | ICD-10-CM | POA: Diagnosis not present

## 2017-10-29 DIAGNOSIS — Z7982 Long term (current) use of aspirin: Secondary | ICD-10-CM | POA: Diagnosis not present

## 2017-10-29 DIAGNOSIS — G4733 Obstructive sleep apnea (adult) (pediatric): Secondary | ICD-10-CM | POA: Diagnosis not present

## 2017-10-29 DIAGNOSIS — E785 Hyperlipidemia, unspecified: Secondary | ICD-10-CM | POA: Diagnosis not present

## 2017-10-29 DIAGNOSIS — E10319 Type 1 diabetes mellitus with unspecified diabetic retinopathy without macular edema: Secondary | ICD-10-CM | POA: Diagnosis not present

## 2017-10-29 DIAGNOSIS — Z79891 Long term (current) use of opiate analgesic: Secondary | ICD-10-CM | POA: Diagnosis not present

## 2017-10-29 DIAGNOSIS — E1051 Type 1 diabetes mellitus with diabetic peripheral angiopathy without gangrene: Secondary | ICD-10-CM | POA: Diagnosis not present

## 2017-10-30 LAB — BASIC METABOLIC PANEL
Anion gap: 14 (ref 5–15)
BUN: 26 mg/dL — AB (ref 8–23)
CALCIUM: 9.3 mg/dL (ref 8.9–10.3)
CHLORIDE: 93 mmol/L — AB (ref 98–111)
CO2: 29 mmol/L (ref 22–32)
CREATININE: 1.72 mg/dL — AB (ref 0.61–1.24)
GFR calc Af Amer: 47 mL/min — ABNORMAL LOW (ref >60)
GFR calc non Af Amer: 41 mL/min — ABNORMAL LOW (ref >60)
Glucose, Bld: 221 mg/dL — ABNORMAL HIGH (ref 70–99)
Potassium: 4 mmol/L (ref 3.5–5.1)
Sodium: 136 mmol/L (ref 135–145)

## 2017-10-31 ENCOUNTER — Encounter (HOSPITAL_BASED_OUTPATIENT_CLINIC_OR_DEPARTMENT_OTHER)
Admission: RE | Disposition: A | Discharge status: Home / Self Care | Payer: Self-pay | Source: Ambulatory Visit | Attending: Otolaryngology

## 2017-10-31 ENCOUNTER — Encounter (HOSPITAL_BASED_OUTPATIENT_CLINIC_OR_DEPARTMENT_OTHER): Payer: Self-pay

## 2017-10-31 ENCOUNTER — Ambulatory Visit (HOSPITAL_BASED_OUTPATIENT_CLINIC_OR_DEPARTMENT_OTHER)
Admission: RE | Admit: 2017-10-31 | Discharge: 2017-11-01 | Disposition: A | Discharge status: Home / Self Care | Payer: 59 | Source: Ambulatory Visit | Attending: Otolaryngology | Admitting: Otolaryngology

## 2017-10-31 ENCOUNTER — Ambulatory Visit (HOSPITAL_COMMUNITY): Payer: 59

## 2017-10-31 ENCOUNTER — Other Ambulatory Visit: Payer: Self-pay

## 2017-10-31 ENCOUNTER — Ambulatory Visit (HOSPITAL_BASED_OUTPATIENT_CLINIC_OR_DEPARTMENT_OTHER): Payer: 59 | Admitting: Anesthesiology

## 2017-10-31 DIAGNOSIS — E1051 Type 1 diabetes mellitus with diabetic peripheral angiopathy without gangrene: Secondary | ICD-10-CM | POA: Insufficient documentation

## 2017-10-31 DIAGNOSIS — Z794 Long term (current) use of insulin: Secondary | ICD-10-CM | POA: Insufficient documentation

## 2017-10-31 DIAGNOSIS — E785 Hyperlipidemia, unspecified: Secondary | ICD-10-CM | POA: Insufficient documentation

## 2017-10-31 DIAGNOSIS — M1990 Unspecified osteoarthritis, unspecified site: Secondary | ICD-10-CM | POA: Insufficient documentation

## 2017-10-31 DIAGNOSIS — E10319 Type 1 diabetes mellitus with unspecified diabetic retinopathy without macular edema: Secondary | ICD-10-CM | POA: Insufficient documentation

## 2017-10-31 DIAGNOSIS — E108 Type 1 diabetes mellitus with unspecified complications: Secondary | ICD-10-CM | POA: Diagnosis not present

## 2017-10-31 DIAGNOSIS — H903 Sensorineural hearing loss, bilateral: Secondary | ICD-10-CM | POA: Diagnosis not present

## 2017-10-31 DIAGNOSIS — Z885 Allergy status to narcotic agent status: Secondary | ICD-10-CM | POA: Insufficient documentation

## 2017-10-31 DIAGNOSIS — Z79891 Long term (current) use of opiate analgesic: Secondary | ICD-10-CM | POA: Insufficient documentation

## 2017-10-31 DIAGNOSIS — Z09 Encounter for follow-up examination after completed treatment for conditions other than malignant neoplasm: Secondary | ICD-10-CM

## 2017-10-31 DIAGNOSIS — Z7982 Long term (current) use of aspirin: Secondary | ICD-10-CM | POA: Insufficient documentation

## 2017-10-31 DIAGNOSIS — Z9621 Cochlear implant status: Secondary | ICD-10-CM

## 2017-10-31 DIAGNOSIS — I10 Essential (primary) hypertension: Secondary | ICD-10-CM | POA: Diagnosis not present

## 2017-10-31 DIAGNOSIS — F17210 Nicotine dependence, cigarettes, uncomplicated: Secondary | ICD-10-CM | POA: Insufficient documentation

## 2017-10-31 DIAGNOSIS — G4733 Obstructive sleep apnea (adult) (pediatric): Secondary | ICD-10-CM | POA: Diagnosis not present

## 2017-10-31 DIAGNOSIS — F329 Major depressive disorder, single episode, unspecified: Secondary | ICD-10-CM | POA: Insufficient documentation

## 2017-10-31 DIAGNOSIS — Z881 Allergy status to other antibiotic agents status: Secondary | ICD-10-CM | POA: Insufficient documentation

## 2017-10-31 DIAGNOSIS — Z79899 Other long term (current) drug therapy: Secondary | ICD-10-CM | POA: Insufficient documentation

## 2017-10-31 DIAGNOSIS — G8929 Other chronic pain: Secondary | ICD-10-CM | POA: Insufficient documentation

## 2017-10-31 HISTORY — PX: COCHLEAR IMPLANT: SHX184

## 2017-10-31 HISTORY — DX: Other complications of anesthesia, initial encounter: T88.59XA

## 2017-10-31 HISTORY — DX: Adverse effect of unspecified anesthetic, initial encounter: T41.45XA

## 2017-10-31 LAB — GLUCOSE, CAPILLARY
GLUCOSE-CAPILLARY: 197 mg/dL — AB (ref 70–99)
GLUCOSE-CAPILLARY: 182 mg/dL — AB (ref 70–99)
GLUCOSE-CAPILLARY: 151 mg/dL — AB (ref 70–99)
Glucose-Capillary: 118 mg/dL — ABNORMAL HIGH (ref 70–99)
Glucose-Capillary: 248 mg/dL — ABNORMAL HIGH (ref 70–99)
Glucose-Capillary: 238 mg/dL — ABNORMAL HIGH (ref 70–99)

## 2017-10-31 SURGERY — INSERTION, IMPLANT, COCHLEAR
Anesthesia: General | Site: Ear | Laterality: Left

## 2017-10-31 MED ORDER — ONDANSETRON HCL 4 MG PO TABS: 4.0000 mg | ORAL_TABLET | ORAL | Status: DC | PRN

## 2017-10-31 MED ORDER — LIDOCAINE 2% (20 MG/ML) 5 ML SYRINGE
INTRAMUSCULAR | Status: AC
  Filled 2017-10-31: qty 5

## 2017-10-31 MED ORDER — BACITRACIN ZINC 500 UNIT/GM EX OINT
TOPICAL_OINTMENT | CUTANEOUS | Status: AC
  Filled 2017-10-31: qty 28.35

## 2017-10-31 MED ORDER — DEXAMETHASONE SODIUM PHOSPHATE 4 MG/ML IJ SOLN
INTRAMUSCULAR | Status: DC | PRN
  Administered 2017-10-31: 10 mg via INTRAVENOUS

## 2017-10-31 MED ORDER — CEFAZOLIN SODIUM-DEXTROSE 2-4 GM/100ML-% IV SOLN
INTRAVENOUS | Status: AC
  Filled 2017-10-31: qty 100

## 2017-10-31 MED ORDER — METOPROLOL TARTRATE 50 MG PO TABS: 50.0000 mg | ORAL_TABLET | Freq: Once | ORAL | Status: DC

## 2017-10-31 MED ORDER — AMLODIPINE BESYLATE 5 MG PO TABS: 5.0000 mg | ORAL_TABLET | Freq: Every day | ORAL | Status: DC

## 2017-10-31 MED ORDER — FENTANYL CITRATE (PF) 100 MCG/2ML IJ SOLN
INTRAMUSCULAR | Status: AC
  Filled 2017-10-31: qty 2

## 2017-10-31 MED ORDER — LIDOCAINE-EPINEPHRINE 1 %-1:100000 IJ SOLN
INTRAMUSCULAR | Status: DC | PRN
  Administered 2017-10-31: 8 mL

## 2017-10-31 MED ORDER — HYDROCHLOROTHIAZIDE 25 MG PO TABS: 25.0000 mg | ORAL_TABLET | Freq: Every day | ORAL | Status: DC

## 2017-10-31 MED ORDER — SERTRALINE HCL 50 MG PO TABS: 50.0000 mg | ORAL_TABLET | Freq: Every day | ORAL | Status: DC

## 2017-10-31 MED ORDER — BACITRACIN ZINC 500 UNIT/GM EX OINT: 1.0000 "application " | TOPICAL_OINTMENT | Freq: Three times a day (TID) | CUTANEOUS | Status: DC

## 2017-10-31 MED ORDER — SCOPOLAMINE 1 MG/3DAYS TD PT72
1.0000 | MEDICATED_PATCH | Freq: Once | TRANSDERMAL | Status: AC | PRN
  Administered 2017-10-31: 1 via TRANSDERMAL

## 2017-10-31 MED ORDER — SUCCINYLCHOLINE CHLORIDE 200 MG/10ML IV SOSY
PREFILLED_SYRINGE | INTRAVENOUS | Status: AC
  Filled 2017-10-31: qty 10

## 2017-10-31 MED ORDER — MIDAZOLAM HCL 2 MG/2ML IJ SOLN
1.0000 mg | INTRAMUSCULAR | Status: DC | PRN
  Administered 2017-10-31: 1 mg via INTRAVENOUS

## 2017-10-31 MED ORDER — METHADONE HCL 5 MG PO TABS: 5.0000 mg | ORAL_TABLET | Freq: Three times a day (TID) | ORAL | Status: DC

## 2017-10-31 MED ORDER — OXYCODONE HCL 5 MG PO TABS: 5.0000 mg | ORAL_TABLET | Freq: Once | ORAL | Status: DC | PRN

## 2017-10-31 MED ORDER — SODIUM CHLORIDE 0.9 % IV SOLN
INTRAVENOUS | Status: DC | PRN
  Administered 2017-10-31: 12:00:00 via INTRAVENOUS
  Administered 2017-10-31: 100 ug/min via INTRAVENOUS

## 2017-10-31 MED ORDER — METHYLENE BLUE 0.5 % INJ SOLN
INTRAVENOUS | Status: DC | PRN
  Administered 2017-10-31: .25 mL

## 2017-10-31 MED ORDER — DEXAMETHASONE SODIUM PHOSPHATE 10 MG/ML IJ SOLN
INTRAMUSCULAR | Status: AC
  Filled 2017-10-31: qty 1

## 2017-10-31 MED ORDER — NORTRIPTYLINE HCL 25 MG PO CAPS: 25.0000 mg | ORAL_CAPSULE | Freq: Every day | ORAL | Status: DC

## 2017-10-31 MED ORDER — DEXAMETHASONE SODIUM PHOSPHATE 4 MG/ML IJ SOLN: 10.0000 mg | Freq: Once | INTRAMUSCULAR | Status: DC

## 2017-10-31 MED ORDER — PROPOFOL 500 MG/50ML IV EMUL
INTRAVENOUS | Status: AC
  Filled 2017-10-31: qty 50

## 2017-10-31 MED ORDER — METFORMIN HCL 500 MG PO TABS: 500.0000 mg | ORAL_TABLET | Freq: Two times a day (BID) | ORAL | Status: DC

## 2017-10-31 MED ORDER — FENTANYL CITRATE (PF) 100 MCG/2ML IJ SOLN
50.0000 ug | INTRAMUSCULAR | Status: DC | PRN
  Administered 2017-10-31: 50 ug via INTRAVENOUS

## 2017-10-31 MED ORDER — GABAPENTIN 300 MG PO CAPS: 600.0000 mg | ORAL_CAPSULE | Freq: Three times a day (TID) | ORAL | Status: DC

## 2017-10-31 MED ORDER — LOSARTAN POTASSIUM 50 MG PO TABS: 100.0000 mg | ORAL_TABLET | Freq: Every day | ORAL | Status: DC

## 2017-10-31 MED ORDER — ARTIFICIAL TEARS OPHTHALMIC OINT
TOPICAL_OINTMENT | OPHTHALMIC | Status: AC
  Filled 2017-10-31: qty 3.5

## 2017-10-31 MED ORDER — FENTANYL CITRATE (PF) 100 MCG/2ML IJ SOLN: 25.0000 ug | INTRAMUSCULAR | Status: DC | PRN

## 2017-10-31 MED ORDER — ONDANSETRON 4 MG PO TBDP: 4.0000 mg | ORAL_TABLET | Freq: Every day | ORAL | Status: DC

## 2017-10-31 MED ORDER — METHOCARBAMOL 500 MG PO TABS: 500.0000 mg | ORAL_TABLET | ORAL | Status: DC | PRN

## 2017-10-31 MED ORDER — PHENYLEPHRINE HCL 10 MG/ML IJ SOLN
INTRAMUSCULAR | Status: AC
  Filled 2017-10-31: qty 1

## 2017-10-31 MED ORDER — PHENYLEPHRINE 40 MCG/ML (10ML) SYRINGE FOR IV PUSH (FOR BLOOD PRESSURE SUPPORT)
PREFILLED_SYRINGE | INTRAVENOUS | Status: AC
  Filled 2017-10-31: qty 10

## 2017-10-31 MED ORDER — PROPOFOL 500 MG/50ML IV EMUL
INTRAVENOUS | Status: DC | PRN
  Administered 2017-10-31: 50 ug/kg/min via INTRAVENOUS

## 2017-10-31 MED ORDER — ONDANSETRON HCL 4 MG/2ML IJ SOLN: 4.0000 mg | Freq: Once | INTRAMUSCULAR | Status: DC

## 2017-10-31 MED ORDER — BENZONATATE 100 MG PO CAPS: 100.0000 mg | ORAL_CAPSULE | ORAL | Status: DC | PRN

## 2017-10-31 MED ORDER — ONDANSETRON HCL 4 MG/2ML IJ SOLN: 4.0000 mg | Freq: Once | INTRAMUSCULAR | Status: DC | PRN

## 2017-10-31 MED ORDER — SIMVASTATIN 20 MG PO TABS: 20.0000 mg | ORAL_TABLET | Freq: Every day | ORAL | Status: DC

## 2017-10-31 MED ORDER — LIDOCAINE-EPINEPHRINE 1 %-1:100000 IJ SOLN
INTRAMUSCULAR | Status: AC
  Filled 2017-10-31: qty 1

## 2017-10-31 MED ORDER — MUPIROCIN 2 % EX OINT: 1.0000 "application " | TOPICAL_OINTMENT | Freq: Three times a day (TID) | CUTANEOUS | Status: DC

## 2017-10-31 MED ORDER — DEXAMETHASONE SODIUM PHOSPHATE 10 MG/ML IJ SOLN
INTRAMUSCULAR | Status: DC | PRN
  Administered 2017-10-31: .5 mL
  Administered 2017-10-31: 1 mL

## 2017-10-31 MED ORDER — CIPROFLOXACIN-HYDROCORTISONE 0.2-1 % OT SUSP
OTIC | Status: DC | PRN
  Administered 2017-10-31: 3 [drp] via OTIC

## 2017-10-31 MED ORDER — BACITRACIN ZINC 500 UNIT/GM EX OINT
TOPICAL_OINTMENT | CUTANEOUS | Status: DC | PRN
  Administered 2017-10-31: 1 via TOPICAL

## 2017-10-31 MED ORDER — EPINEPHRINE 30 MG/30ML IJ SOLN
INTRAMUSCULAR | Status: AC
  Filled 2017-10-31: qty 1

## 2017-10-31 MED ORDER — OXYCODONE HCL 5 MG/5ML PO SOLN: 5.0000 mg | Freq: Once | ORAL | Status: DC | PRN

## 2017-10-31 MED ORDER — SCOPOLAMINE 1 MG/3DAYS TD PT72
MEDICATED_PATCH | TRANSDERMAL | Status: AC
  Filled 2017-10-31: qty 1

## 2017-10-31 MED ORDER — SODIUM CHLORIDE 0.9 % IV SOLN
INTRAVENOUS | Status: DC | PRN
  Administered 2017-10-31: 500 mL

## 2017-10-31 MED ORDER — LACTATED RINGERS IV SOLN
INTRAVENOUS | Status: DC
  Administered 2017-10-31 – 2017-11-01 (×2): via INTRAVENOUS

## 2017-10-31 MED ORDER — METHYLENE BLUE 0.5 % INJ SOLN
INTRAVENOUS | Status: AC
  Filled 2017-10-31: qty 10

## 2017-10-31 MED ORDER — CIPROFLOXACIN-HYDROCORTISONE 0.2-1 % OT SUSP
OTIC | Status: AC
  Filled 2017-10-31: qty 10

## 2017-10-31 MED ORDER — GLIPIZIDE 5 MG PO TABS: 5.0000 mg | ORAL_TABLET | Freq: Every day | ORAL | Status: DC

## 2017-10-31 MED ORDER — CEFAZOLIN (ANCEF) 1 G IV SOLR
2.0000 g | INTRAVENOUS | Status: AC
  Administered 2017-10-31: 2 g

## 2017-10-31 MED ORDER — INSULIN REGULAR HUMAN 100 UNIT/ML IJ SOLN
10.0000 [IU] | Freq: Once | INTRAMUSCULAR | Status: AC
  Administered 2017-10-31: 10 [IU] via SUBCUTANEOUS

## 2017-10-31 MED ORDER — ONDANSETRON HCL 4 MG/2ML IJ SOLN
INTRAMUSCULAR | Status: AC
  Filled 2017-10-31: qty 4

## 2017-10-31 MED ORDER — HYDROCODONE-ACETAMINOPHEN 5-325 MG PO TABS
1.0000 | ORAL_TABLET | ORAL | Status: DC | PRN
  Administered 2017-11-01: 1 via ORAL
  Administered 2017-11-01: 2 via ORAL
  Filled 2017-10-31: qty 2
  Filled 2017-10-31: qty 1

## 2017-10-31 MED ORDER — INSULIN REGULAR HUMAN 100 UNIT/ML IJ SOLN: 0.0000 [IU] | Freq: Three times a day (TID) | INTRAMUSCULAR | Status: DC | PRN

## 2017-10-31 MED ORDER — MIDAZOLAM HCL 2 MG/2ML IJ SOLN
INTRAMUSCULAR | Status: AC
  Filled 2017-10-31: qty 2

## 2017-10-31 MED ORDER — ONDANSETRON HCL 4 MG/2ML IJ SOLN
INTRAMUSCULAR | Status: DC | PRN
  Administered 2017-10-31 (×2): 4 mg via INTRAVENOUS

## 2017-10-31 MED ORDER — LACTATED RINGERS IV SOLN
INTRAVENOUS | Status: DC
  Administered 2017-10-31 (×2): via INTRAVENOUS

## 2017-10-31 MED ORDER — VASOPRESSIN 20 UNIT/ML IV SOLN
INTRAVENOUS | Status: DC | PRN
  Administered 2017-10-31: 1 [IU] via INTRAVENOUS
  Administered 2017-10-31: .5 [IU] via INTRAVENOUS
  Administered 2017-10-31: 1 [IU] via INTRAVENOUS
  Administered 2017-10-31 (×2): .5 [IU] via INTRAVENOUS

## 2017-10-31 MED ORDER — GLYCOPYRROLATE 0.2 MG/ML IJ SOLN
INTRAMUSCULAR | Status: DC | PRN
  Administered 2017-10-31: 0.2 mg via INTRAVENOUS

## 2017-10-31 MED ORDER — SUCCINYLCHOLINE CHLORIDE 20 MG/ML IJ SOLN
INTRAMUSCULAR | Status: DC | PRN
  Administered 2017-10-31: 120 mg via INTRAVENOUS

## 2017-10-31 MED ORDER — PROPOFOL 10 MG/ML IV BOLUS
INTRAVENOUS | Status: DC | PRN
  Administered 2017-10-31: 120 mg via INTRAVENOUS

## 2017-10-31 MED ORDER — ZOLPIDEM TARTRATE 5 MG PO TABS: 5.0000 mg | ORAL_TABLET | Freq: Every evening | ORAL | Status: DC | PRN

## 2017-10-31 MED ORDER — CEFAZOLIN SODIUM-DEXTROSE 1-4 GM/50ML-% IV SOLN
1.0000 g | Freq: Three times a day (TID) | INTRAVENOUS | Status: AC
  Administered 2017-10-31 – 2017-11-01 (×3): 1 g via INTRAVENOUS
  Filled 2017-10-31 (×3): qty 50

## 2017-10-31 MED ORDER — PHENYLEPHRINE 40 MCG/ML (10ML) SYRINGE FOR IV PUSH (FOR BLOOD PRESSURE SUPPORT)
PREFILLED_SYRINGE | INTRAVENOUS | Status: DC | PRN
  Administered 2017-10-31 (×3): 80 ug via INTRAVENOUS

## 2017-10-31 MED ORDER — DIVALPROEX SODIUM ER 250 MG PO TB24: 250.0000 mg | ORAL_TABLET | Freq: Every day | ORAL | Status: DC

## 2017-10-31 MED ORDER — IBUPROFEN 100 MG/5ML PO SUSP: 400.0000 mg | Freq: Four times a day (QID) | ORAL | Status: DC | PRN

## 2017-10-31 MED ORDER — LIDOCAINE HCL (CARDIAC) PF 100 MG/5ML IV SOSY
PREFILLED_SYRINGE | INTRAVENOUS | Status: DC | PRN
  Administered 2017-10-31: 60 mg via INTRAVENOUS

## 2017-10-31 MED ORDER — EPHEDRINE SULFATE 50 MG/ML IJ SOLN
INTRAMUSCULAR | Status: DC | PRN
  Administered 2017-10-31 (×5): 10 mg via INTRAVENOUS

## 2017-10-31 MED ORDER — ONDANSETRON HCL 4 MG/2ML IJ SOLN: 4.0000 mg | INTRAMUSCULAR | Status: DC | PRN

## 2017-10-31 MED ORDER — MUPIROCIN 2 % EX OINT
TOPICAL_OINTMENT | CUTANEOUS | Status: AC
  Filled 2017-10-31: qty 22

## 2017-10-31 MED ORDER — MORPHINE SULFATE (PF) 4 MG/ML IV SOLN: 1.0000 mg | INTRAVENOUS | Status: DC | PRN

## 2017-10-31 SURGICAL SUPPLY — 88 items
APL SKNCLS STERI-STRIP NONHPOA (GAUZE/BANDAGES/DRESSINGS)
ATTRACTOMAT 16X20 MAGNETIC DRP (DRAPES) IMPLANT
BAG DECANTER FOR FLEXI CONT (MISCELLANEOUS) ×2 IMPLANT
BALL CTTN LRG ABS STRL LF (GAUZE/BANDAGES/DRESSINGS) ×1
BENZOIN TINCTURE PRP APPL 2/3 (GAUZE/BANDAGES/DRESSINGS) IMPLANT
BLADE CLIPPER SURG (BLADE) ×3 IMPLANT
BLADE NDL 3 SS STRL (BLADE) ×1 IMPLANT
BLADE NEEDLE 3 SS STRL (BLADE) ×2 IMPLANT
BUR DIAMOND COARSE 3.0 (BURR) ×1 IMPLANT
BUR ROUNG CUTTER 5.0 (BUR) ×1 IMPLANT
BUR SABER RD CUTTING 3.0 (BURR) ×1 IMPLANT
BUR SABER TAPERED DIAMOND 1 (BURR) ×1 IMPLANT
BUR SURG RND 2.0 DIAM (BURR) ×1 IMPLANT
CANISTER SUCT 1200ML W/VALVE (MISCELLANEOUS) ×2 IMPLANT
CORD BIPOLAR FORCEPS 12FT (ELECTRODE) ×2 IMPLANT
COTTONBALL LRG STERILE PKG (GAUZE/BANDAGES/DRESSINGS) ×2 IMPLANT
COVER PROBE 5X48 (MISCELLANEOUS)
COVER SURGICAL LIGHT HANDLE (MISCELLANEOUS) ×1 IMPLANT
COVER WAND RF STERILE (DRAPES) IMPLANT
DECANTER SPIKE VIAL GLASS SM (MISCELLANEOUS) ×2 IMPLANT
DEPRESSOR TONGUE BLADE STERILE (MISCELLANEOUS) ×2 IMPLANT
DRAIN JACKSON RD 7FR 3/32 (WOUND CARE) IMPLANT
DRAIN PENROSE 1/4X12 LTX STRL (WOUND CARE) IMPLANT
DRAPE C-ARM 42X72 X-RAY (DRAPES) ×2 IMPLANT
DRAPE INCISE IOBAN 66X45 STRL (DRAPES) ×2 IMPLANT
DRAPE MICROSCOPE WILD 40.5X102 (DRAPES) ×2 IMPLANT
DRAPE SURG 17X23 STRL (DRAPES) ×2 IMPLANT
DRSG GLASSCOCK MASTOID ADT (GAUZE/BANDAGES/DRESSINGS) ×2 IMPLANT
ELECT COATED BLADE 2.86 ST (ELECTRODE) ×2 IMPLANT
ELECT PAIRED SUBDERMAL (MISCELLANEOUS) ×2
ELECT REM PT RETURN 9FT ADLT (ELECTROSURGICAL) ×2
ELECTRODE PAIRED SUBDERMAL (MISCELLANEOUS) ×1 IMPLANT
ELECTRODE REM PT RTRN 9FT ADLT (ELECTROSURGICAL) ×1 IMPLANT
GAUZE 4X4 16PLY RFD (DISPOSABLE) ×2 IMPLANT
GAUZE SPONGE 4X4 12PLY STRL (GAUZE/BANDAGES/DRESSINGS) IMPLANT
GLOVE BIO SURGEON STRL SZ7 (GLOVE) ×3 IMPLANT
GLOVE ECLIPSE 7.5 STRL STRAW (GLOVE) ×4 IMPLANT
GLOVE EXAM NITRILE MD LF STRL (GLOVE) ×1 IMPLANT
GOWN STRL REUS W/ TWL LRG LVL3 (GOWN DISPOSABLE) ×1 IMPLANT
GOWN STRL REUS W/ TWL XL LVL3 (GOWN DISPOSABLE) ×1 IMPLANT
GOWN STRL REUS W/TWL LRG LVL3 (GOWN DISPOSABLE) ×2
GOWN STRL REUS W/TWL XL LVL3 (GOWN DISPOSABLE) ×4
IV NS 500ML (IV SOLUTION) ×2
IV NS 500ML BAXH (IV SOLUTION) ×1 IMPLANT
IV NS IRRIG 3000ML ARTHROMATIC (IV SOLUTION) ×2 IMPLANT
IV SET EXT 30 76VOL 4 MALE LL (IV SETS) ×2 IMPLANT
KIT CVR 48X5XPRB PLUP LF (MISCELLANEOUS) IMPLANT
KIT SONNET EAS RONDO2 (KITS) ×1 IMPLANT
MARKER SKIN DUAL TIP RULER LAB (MISCELLANEOUS) IMPLANT
NDL HYPO 18GX1.5 BLUNT FILL (NEEDLE) ×1 IMPLANT
NDL HYPO 25X1 1.5 SAFETY (NEEDLE) ×2 IMPLANT
NDL SAFETY ECLIPSE 18X1.5 (NEEDLE) ×1 IMPLANT
NDL SPNL 25GX3.5 QUINCKE BL (NEEDLE) ×1 IMPLANT
NEEDLE HYPO 18GX1.5 BLUNT FILL (NEEDLE) ×2 IMPLANT
NEEDLE HYPO 18GX1.5 SHARP (NEEDLE) ×2
NEEDLE HYPO 22GX1.5 SAFETY (NEEDLE) ×2 IMPLANT
NEEDLE HYPO 25X1 1.5 SAFETY (NEEDLE) ×4 IMPLANT
NEEDLE SPNL 25GX3.5 QUINCKE BL (NEEDLE) ×2 IMPLANT
NS IRRIG 1000ML POUR BTL (IV SOLUTION) ×3 IMPLANT
PACK BASIN DAY SURGERY FS (CUSTOM PROCEDURE TRAY) ×2 IMPLANT
PACK ENT DAY SURGERY (CUSTOM PROCEDURE TRAY) ×2 IMPLANT
PATTIES SURGICAL .25X.25 (GAUZE/BANDAGES/DRESSINGS) ×2 IMPLANT
PENCIL BUTTON HOLSTER BLD 10FT (ELECTRODE) ×2 IMPLANT
PIN COCHLEAR FLEX 28 (Prosthesis and Implant ENT) ×1 IMPLANT
PIN SAFETY STERILE (MISCELLANEOUS) IMPLANT
PROBE NERVBE PRASS .33 (MISCELLANEOUS) ×2 IMPLANT
SET IRRIG Y TYPE TUR BLADDER L (SET/KITS/TRAYS/PACK) ×2 IMPLANT
SLEEVE SCD COMPRESS KNEE MED (MISCELLANEOUS) ×2 IMPLANT
SPONGE NEURO XRAY DETECT 1X3 (DISPOSABLE) ×2 IMPLANT
SPONGE SURGIFOAM ABS GEL 100 (HEMOSTASIS) ×2 IMPLANT
SPONGE SURGIFOAM ABS GEL 12-7 (HEMOSTASIS) ×2 IMPLANT
STAPLER VISISTAT (STAPLE) ×2 IMPLANT
STRIP CLOSURE SKIN 1/2X4 (GAUZE/BANDAGES/DRESSINGS) ×2 IMPLANT
SUT BONE WAX W31G (SUTURE) ×2 IMPLANT
SUT CHROMIC 3 0 PS 2 (SUTURE) ×4 IMPLANT
SUT CHROMIC 3 0 SH 27 (SUTURE) ×2 IMPLANT
SUT ETHILON 5 0 PS 2 18 (SUTURE) IMPLANT
SUT NOVAFIL 5 0 BLK 18 IN P13 (SUTURE) ×1 IMPLANT
SYR 3ML 23GX1 SAFETY (SYRINGE) ×2 IMPLANT
SYR BULB 3OZ (MISCELLANEOUS) ×2 IMPLANT
SYR TB 1ML LL NO SAFETY (SYRINGE) ×4 IMPLANT
TOWEL GREEN STERILE FF (TOWEL DISPOSABLE) ×4 IMPLANT
TOWEL OR NON WOVEN STRL DISP B (DISPOSABLE) ×2 IMPLANT
TRAY DSU PREP LF (CUSTOM PROCEDURE TRAY) ×2 IMPLANT
TRAY FOL W/BAG SLVR 16FR STRL (SET/KITS/TRAYS/PACK) IMPLANT
TRAY FOLEY W/BAG SLVR 14FR LF (SET/KITS/TRAYS/PACK) IMPLANT
TRAY FOLEY W/BAG SLVR 16FR LF (SET/KITS/TRAYS/PACK)
TUBING IRRIGATION (MISCELLANEOUS) ×2 IMPLANT

## 2017-10-31 NOTE — CV Procedure (Signed)
Op Note      NAME: @                                                                  ACCOUNT NO.: [id number]  MEDICAL RECORD NO.: [id number] LOCATION:  [facility]                       FACILITY: [facility]   PHYSICIAN:  Fannie Knee, M.D., M.S., F.A.C.S.       DATE OF BIRTH: March 27, 1955,   62 y.o.   DATE OF PROCEDURE: October 31, 2017 DATE OF DISCHARGE:    November 01, 2017   OPERATIVE REPORT     JUSTIFICATION FOR PROCEDURE:   The patient is a 62 year old [place] male with severe to profound bilateral sensorineural hearing loss beyond hearing aids.  On audiometric testing on September 24, 2017, he was found to have an SRT of 85 DB right ear and 100 DB AS with 12% discrimination right ear and 8% discrimination left ear.  The patient underwent a complete cochlear implant evaluation including CNC and AZ bio testing, a CT scan of his temporal bones and preoperative anesthesia clearance.  He was recommended for a MED-EL Mi 1250 Synchrony2 + Flex 28 electrode for his left ear.  Risks, complications, and alternatives of cochlear implantation were explained to the patient and to his wife.  Questions were invited and answered.  Informed consent was read by the patient, signed, and witnessed.  Extensive preoperative teaching and counseling were provided.  The patient understands that there are no guarantees that he will be able to regain a hearing sensation with the cochlear implant.   JUSTIFICATION FOR OUTPATIENT SETTING:  [name] age and need for general endotracheal anesthesia.   JUSTIFICATION FOR OVERNIGHT STAY:  This 23 hours of observation following a left cochlear implant.   PREOPERATIVE DIAGNOSIS:   Severe to profound bilateral sensorineural hearing loss beyond hearing aids   POSTOPERATIVE DIAGNOSIS:   Severe to profound bilateral sensorineural hearing loss beyond hearing aids   OPERATION:   Left MED-EL Synchrony2 [id number] PIN + FLEX 28 electrode cochlear implant    SURGEON:  Fannie Knee, M.D., M.S., F.A.C.S.   ANESTHESIA:  General endotracheal, Lidia Collum, MD, CRNA = Jerline Pain   COMPLICATIONS:  None.   SUMMARY OF OPERATION:  After the patient was taken to operating room #5 at [facility], he was placed in the supine position.  An IV had been begun in the holding area.  A general IV induction was performed by Lidia Collum, MD, and the patient was orally intubated without difficulty by Jerline Pain, CRNA.    He was properly positioned and monitored.  Elbows and ankles were padded with foam rubber, and I initiated a time-out at 9:02 am.   The patient was then turned 90 degrees counter clockwise.  The left middle ear was then injected transtympanically with 0.5 mL of Decadron 10 mg/mL using a 25-gauge spinal needle.   A small amount of hair was clipped in the right postauricular area, hair was taped, and a stockinette cap was applied.  A left lazy-S cochlear implant incision was then marked with the aid of a MED-EL BTE template  and a dummy MED-EL ICS. The site was marked on the scalp for the center portion of the ICS.  The incision line was then infiltrated with 8 mL of 1% Xylocaine with 1:100,000 epinephrine.   Silver wire needle electrodes were then inserted into the left orbicularis oris and oculi muscles and suprasternal notch. The electrodes were connected to a NIM facial nerve monitor preamplifier.  All electrodes were tested and were found to be functioning properly.   The circulating nurse inserted a Foley catheter. [name] left ear and hemiface were then prepped with Betadine and draped in the standard fashion for a cochlear implant.     A left postauricular incision was then made and carried down through skin and subcutaneous tissue.  A T-shaped incision was then made in the musculoperiosteal layer, and anterior and posterior subperiosteal flaps were elevated.  Self-retaining retractors were placed.  The mastoid  cortex and bone of the skull were yellow in color. The mastoid cortex was then removed with a #5 cutting bur using continuous suction irrigation and a Stryker drill.  The mastoid was found to be normally pneumatized.  The antrum was opened, and the dissection was carried down toward the digastric tip.  The aditus ad antrum was then followed to the attic and enough bone was removed in the attic to identify the short process of the incus.  The facial recess was then opened with #3 cutting bur and #2 diamond burs. The middle ear was entered without difficulty.  The facial nerve was identified but was not exposed.  The nerve stimulated at 0.4 milliamp throughout the procedure.  Facial recess was enlarged. The chorda tympani nerve was not disturbed.   The round window niche was found to be normal in configuration.  The lip of the niche was removed with a 1 mm diamond bur to expose the round window membrane.  A disk of Gelfoam soaked in Decadron 10 mg/mL was then placed against the round window membrane.   The lazy-S incision was then extended.  An incision was made in the temporalis muscle to create a muscle flap, and the site for the ICS was identified.  Using a MED-EL Synchrony2  template, a bony well was drilled for the ICS to a depth of 2 mm.  It was also positioned 55 mm from the posterior-superior corner of the mastoid cavity. Using the MedEL well template the Synchrony2, two holes were drilled in the bony well with a 1 mm bur to engage the pins of the ICS. A trough was drilled from the central portion of the well to the posterior-superior aspect of the mastoid cavity.  The edges were smoothed. Bone wax was used to control bone bleeding.  A pocket was then made posterior to the bony well for the ICS and was checked with a MED-EL Synchrony2 dummy template.  The site was then copiously irrigated with bacitracin containing saline, and hemostasis was obtained with unipolar cautery.  The unipolar cautery was then  turned off. Gloves were changed.   A MED-EL Synchrony2 [id number] PIN + Flex 28 cochlear implant, product [id number], serial [id number], sterilization date by Jun 12, 2020 was then opened under saline and was placed on the Mayo stand.  The implant was then opened and was inspected.  The ICS was then placed in its pocket with the ICS in the bony well.  Decadron 10 mg/mL was then injected into the sheath around the electrode array.  The sheath was then removed.   An  opening was then made in the round window membrane using a 5910 Beaver blade.  The opening was enlarged with a right angle hook. The tip of the electrode array was inserted into the scala tympani.The electrode array was then gently inserted into the cochlea over a 4-minute, 10 second time period.  A full-length insertion was accomplished. Fascial squares were then packed around the round window niche and around the electrode array to seal the round window opening.  The facial recess was then sealed with a muscle plug held in place with a Gel-Foam soaked in saline. The electrode array was coiled into the mastoid inferiorly and was stabilized with 3 rectangles of Gelfoam soaked in saline.   The musculoperiosteal layer was then closed over the electrode array with interrupted 3-0 chromics.  The temporalis muscle flap was then closed over the ICS using interrupted 3-0 chromics.  This stabilized the ICS.  The subcutaneous layer of the lazy-S cochlear implant incision was then closed using interrupted inverted 3-0 chromics.   Arlie Solomons, AUD, my audiologist, then entered the operating room and performed intraoperative testing of the cochlear implant.  She was aided by Lourdes Sledge of MED-EL. All impedances were normal, and all electrodes were found to be functioning.   A C-arm x-ray was then performed and documented proper placement of the electrode array within the left cochlea without kinking or telescoping of th electrode array.   Skin was closed with  skin staples.  Bacitracin ointment was applied to the incision.  The left ear was then padded with Telfa and cotton.  A standard adult Glasscock mastoid dressing was applied loosely in the standard fashion.   [name] Foley catheter was removed by the circulating nurse. The patient was awakened, extubated, and transferred to his hospital bed.    The appeared to tolerate both the general endotracheal anesthesia and the procedure well.  The left the operating room in stable condition.    His right hearing aid was positioned in the unoperated ear prior to awakening.   INTRAOPERATIVE  DATA:   1. Total fluids:   1900 mL. 2. Total estimated blood loss:  Less than 20 mL. 3. Counts:  Sponge, needle, and cotton ball counts were correct at the termination of the procedure. 4. Specimens:  No specimens were sent to microbiology or pathology.   The patient will be admitted to the PACU, then will be admitted to the [facility] for 23 hours of overnight observation.  If stable overnight, the patient will be discharged in the morning of May 02, 2017, with his wife. They will be instructed to have him return to my office on November 07, 2017 at 3:30 PM.   DISCHARGE MEDICATIONS:  1. Cefzil 500 mg p.o. twice daily x10 days with food.  2. Norco 5/325 1 p.o. q.4 hours p.r.n. pain x3 days (#20 tabs) 3. Mupirocin ointment 2% applied to the cochlear implant incision b.i.d. x1 week.   The patient is to follow a regular diet, keep his head elevated, and avoid water exposure AD for the next 3 weeks.  The patient is to call [phone] for any postoperative problems directly related to the procedure.  He will be given both verbal and written instructions.   SUMMARY:  The patient underwent a left MED-EL Synchrony2 [phone] PIN + [id number]  cochlear implant without complication.  A full-length insertion was performed.    The was implanted with serial [id number]. Intraoperative electrode testing revealed all impedances were normal,  and the electrodes  were all functioning.  Intraoperative C-arm x-raydocumented proper placement of the electrode array within the left cochlea.  There were no complications.  The facial nerve was identified, but was not exposed. The nerve stimulated at 0.4 milliamp throughout the case. The chorda tympani nerve was preserved. There were no complications.  Sponge, needle, and cotton ball counts were correct at the termination of the operation.    Signed by: Vicie Mutters, MD Date:         10/31/2017 Time: 12:55 PM Office: [facility], [address] [place]             [place], [place] [address] Phone: [phone] Fax:     [phone]

## 2017-10-31 NOTE — Anesthesia Procedure Notes (Signed)
Procedure Name: Intubation Date/Time: 10/31/2017 8:48 AM Performed by: Lyndee Leo, CRNA Pre-anesthesia Checklist: Patient identified, Emergency Drugs available, Suction available and Patient being monitored Patient Re-evaluated:Patient Re-evaluated prior to induction Oxygen Delivery Method: Circle system utilized Preoxygenation: Pre-oxygenation with 100% oxygen Induction Type: IV induction Ventilation: Mask ventilation without difficulty Laryngoscope Size: Miller and 3 Grade View: Grade I Tube type: Oral Rae Tube size: 8.0 mm Number of attempts: 1 Airway Equipment and Method: Stylet and Oral airway Placement Confirmation: ETT inserted through vocal cords under direct vision,  positive ETCO2 and breath sounds checked- equal and bilateral Secured at: 21 cm Tube secured with: Tape Dental Injury: Teeth and Oropharynx as per pre-operative assessment

## 2017-10-31 NOTE — Anesthesia Postprocedure Evaluation (Signed)
Anesthesia Post Note  Patient: HANSON MEDEIROS  Procedure(s) Performed: COCHLEAR IMPLANT LEFT EAR (Left Ear)     Patient location during evaluation: PACU Anesthesia Type: General Level of consciousness: responds to stimulation Pain management: pain level controlled Vital Signs Assessment: post-procedure vital signs reviewed and stable Respiratory status: spontaneous breathing, nonlabored ventilation and respiratory function stable Cardiovascular status: blood pressure returned to baseline and stable Postop Assessment: no apparent nausea or vomiting Anesthetic complications: no Comments: Pt has been slow to recover in PACU and initially was very agitated, but is now waking up and answering questions appropriately.    Last Vitals:  Vitals:   10/31/17 1545 10/31/17 1600  BP: (!) 115/57   Pulse: 82 70  Resp: (!) 21 12  Temp:    SpO2: 96% 92%    Last Pain:  Vitals:   10/31/17 1600  TempSrc:   PainSc: 0-No pain                 Lidia Collum

## 2017-10-31 NOTE — Anesthesia Preprocedure Evaluation (Addendum)
Anesthesia Evaluation  Patient identified by MRN, date of birth, ID band Patient awake    Reviewed: Allergy & Precautions, NPO status , Patient's Chart, lab work & pertinent test results  History of Anesthesia Complications (+) history of anesthetic complications (had a single incident of what sounds like negative pressure pulmonary edema after extubation; has had anesthesia multiple times since then without complications)  Airway Mallampati: I  TM Distance: >3 FB Neck ROM: Full    Dental no notable dental hx.    Pulmonary Current Smoker,    Pulmonary exam normal        Cardiovascular hypertension, Pt. on medications and Pt. on home beta blockers Normal cardiovascular exam     Neuro/Psych PSYCHIATRIC DISORDERS Anxiety Depression negative neurological ROS     GI/Hepatic negative GI ROS, (+)     substance abuse  ,   Endo/Other  diabetes, Oral Hypoglycemic Agents  Renal/GU Renal disease  negative genitourinary   Musculoskeletal negative musculoskeletal ROS (+) narcotic dependent  Abdominal   Peds  Hematology negative hematology ROS (+) HLD   Anesthesia Other Findings BILATERAL SENSORINEURAL HEARING LOSS  Reproductive/Obstetrics                            Anesthesia Physical Anesthesia Plan  ASA: III  Anesthesia Plan: General   Post-op Pain Management:    Induction: Intravenous  PONV Risk Score and Plan: 2 and Midazolam, Dexamethasone, Ondansetron, Treatment may vary due to age or medical condition and Scopolamine patch - Pre-op  Airway Management Planned: Oral ETT  Additional Equipment: None  Intra-op Plan:   Post-operative Plan: Extubation in OR  Informed Consent: I have reviewed the patients History and Physical, chart, labs and discussed the procedure including the risks, benefits and alternatives for the proposed anesthesia with the patient or authorized representative who  has indicated his/her understanding and acceptance.   Dental advisory given  Plan Discussed with: CRNA  Anesthesia Plan Comments:        Anesthesia Quick Evaluation

## 2017-10-31 NOTE — Progress Notes (Signed)
S: Patient remains very sleepy but is arousable. Answers questions appropriately. Moving all fours.   O: VS stable, facial function intact, no nystagmus, dressing dry. IV ok. Voided. Glucose = 238  A: 1. Unusually sleepy but arousable and appropriately answering questions. 2. Glucose increased from PACU reading. 3. Facial function intact. No vertigo, vomiting, or nystagmus.  P: 1. Regular insulin 10 U SQ now. Will recheck glucose at 8 pm. 2. Continue IVF, monitoring, and observation. 3. Plan DC in morning with wife if remains stable.

## 2017-10-31 NOTE — Transfer of Care (Signed)
Immediate Anesthesia Transfer of Care Note  Patient: [name]  Procedure(s) Performed: COCHLEAR IMPLANT LEFT EAR (Left Ear)  Patient Location: PACU  Anesthesia Type:General  Level of Consciousness: unresponsive and obtunded  Airway & Oxygen Therapy: Patient Spontanous Breathing and Patient connected to face mask oxygen  Post-op Assessment: Report given to RN and Post -op Vital signs reviewed and stable  Post vital signs: Reviewed and stable  Last Vitals:  Vitals Value Taken Time  BP 97/38 10/31/2017  1:22 PM  Temp    Pulse 63 10/31/2017  1:28 PM  Resp 13 10/31/2017  1:28 PM  SpO2 98 % 10/31/2017  1:28 PM  Vitals shown include unvalidated device data.  Last Pain:  Vitals:   10/31/17 0741  TempSrc: Oral  PainSc: 0-No pain         Complications: No apparent anesthesia complications

## 2017-10-31 NOTE — Interval H&P Note (Signed)
History and Physical Interval Note:  10/31/2017 8:30 AM  [name]  has presented today for surgery, with the diagnosis of severe to profound BILATERAL SENSORINEURAL HEARING LOSS beyond hearing aids.  The various methods of treatment have been discussed with the patient and his wife. After consideration of risks, benefits and other options for treatment, the patient has consented to  Procedure(s): COCHLEAR IMPLANT LEFT EAR (Left) with a MedEL [id number] Synchrony2 + Flex 28 electrode as a surgical intervention .  [name] history has been reviewed, patient examined, no change in status, stable for surgery.  I have reviewed the patient's chart and labs.  Questions were answered to the patient's and wife's satisfaction.  Morning glucose was 118.   [name]

## 2017-10-31 NOTE — Brief Op Note (Signed)
10/31/2017  1:17 PM  PATIENT:  [name]  62 y.o. male  PRE-OPERATIVE DIAGNOSIS: Severe to profound bilateral sensorineural hearing loss beyond hearing aids  POST-OPERATIVE DIAGNOSIS:  Severe to profound bilateral sensorineural hearing loss beyond hearing aids  PROCEDURE:  Procedure(s): COCHLEAR IMPLANT LEFT EAR (Left) -MED-EL Synchrony2 [id number] +Flex 28 electrode  SURGEON:  Surgeon(s) and Role:    Vicie Mutters, MD - Primary  PHYSICIAN ASSISTANT: None  ASSISTANTS: none  ANESTHESIA:   general  EBL:  20 ml   BLOOD ADMINISTERED:none  DRAINS: none   LOCAL MEDICATIONS USED:  XYLOCAINE 8 mL's  SPECIMEN:  No Specimen  DISPOSITION OF SPECIMEN:  N/A  COUNTS:  YES  TOURNIQUET:  * No tourniquets in log *  DICTATION: .Dragon Dictation  PLAN OF CARE: Admit for overnight observation  PATIENT DISPOSITION:  PACU - hemodynamically stable.   Delay start of Pharmacological VTE agent (>24hrs) due to surgical blood loss or risk of bleeding: not applicable

## 2017-11-01 ENCOUNTER — Encounter (HOSPITAL_BASED_OUTPATIENT_CLINIC_OR_DEPARTMENT_OTHER): Payer: Self-pay | Admitting: Otolaryngology

## 2017-11-01 DIAGNOSIS — H903 Sensorineural hearing loss, bilateral: Secondary | ICD-10-CM | POA: Diagnosis not present

## 2017-11-01 LAB — GLUCOSE, CAPILLARY: Glucose-Capillary: 131 mg/dL — ABNORMAL HIGH (ref 70–99)

## 2017-11-01 MED FILL — Insulin Regular (Human) Inj 100 Unit/ML: INTRAMUSCULAR | Qty: 0.1 | Status: AC

## 2017-11-01 NOTE — Discharge Summary (Signed)
Physician Discharge Summary  Patient ID: [name] MRN: [id number] DOB/AGE: [birth year] 64 y.o.  Admit date: 10/31/2017 Discharge date: 11/01/2017  Admission Diagnoses: Severe to profound sensorineural hearing loss beyond hearing aids  Discharge Diagnoses: Severe to profound sensorineural hearing loss beyond hearing aids Active Problems:   Postop check   Procedure(s) Left MED-EL Synchrony2 [phone] Pin + [id number] electrode  Complications none  Discharged Condition: stable  Hospital Course: The patient was admitted to [facility] day [facility] the morning of October 31, 2017 and was in room #2.  He was then taken to operating room #5 and underwent an uncomplicated left MED-EL Synchrony2 [id number] Pi n + Flex 28 electrode cochlear implant.  A full-length insertion was performed without difficulty.  Intraoperative testing by Arlie Solomons my audiologist documented all electrodes were functioning normally and all impedances were within normal limits.  An intraoperative C arm x-ray documented correct placement within the left cochlea.  The patient was very somnolent in the PACU during the early evening hours of October 16.  Serum glucose level is elevated in the evening and the patient received 10 units of subcu dealer insulin.  The morning of November 01, 2017, the patient was awake and alert with stable vital signs, intact facial function, no nystagmus, and a stable left postauricular incision without signs of hematoma.  He was discharged on the morning of October 17 with his wife in stable condition.  Consults: None  Significant Diagnostic Studies: Intraoperative C arm x-ray of his left cochlear implant and intraoperative electrical testing by my audiologist, Arlie Solomons documenting all electrodes were functioning and all impedances were within normal limits.  Treatments: surgery: Left MED-EL Synchrony2 [phone] Pin + Flex 28 cochlear implant, a full-length insertion, without  complications.  Discharge Exam: Blood pressure (!) 139/59, pulse 64, temperature 99 F (37.2 C), resp. rate 16, height 5\' 10"  (1.778 m), weight 74.8 kg, SpO2 97 %. The patient was awake and alert on the morning of November 01, 2017 with intact facial function and no nystagmus.  Disposition: Discharged home with his wife in stable condition  Diet regular ADA diet  Room Locations The patient was in preoperative room #2, operating room #5, and covering care room #2.  Activity Quiet interactivity x1 week with lifting less than 20 pounds.  Water precautions left ear x 1wk.     Signed: Vicie Mutters MD 11/01/2017, 8:40 AM

## 2017-11-01 NOTE — Progress Notes (Signed)
S: Patient was much more awake and alert this morning.  He had a quiet night.  He does not complain of any pain, vertigo, tinnitus, or otalgia.  The patient has been up to the bathroom and has been steady.  He has been voiding without difficulty.  His wife was in the room today.  O: The patient was awake and alert.  Vital signs were stable.  She will function was intact and symmetric.  There was no nystagmus.  Glucose was 131 this morning.        The left auricular incision was stable without evidence of hematoma.  The incision was cleaned and mupirocin ointment was applied.  A: 1.  [name] somnolence was due to a medication effect, probably methadone and gabapentin. 2.  Patient was much more awake and alert this morning and without any other complaints. 3.  Patient's glucose has decreased to 131.  He will return to his home medications. 4.  Okay for discharge this morning with his wife.  P: 1.  Discharge this morning with his wife. 2.  Return to my office in 1 week for follow-up and staple removal. 3.  Cochlear implant activation will be performed in approximately 1 month. 4.  He should follow his regular ADA diet. 5.  Quite indoor activity with no lifting greater than 20 pounds. 6.  Water precautions left ear 7.  Discharge medications include:       A.  Cefzil 500 mg p.o. twice daily x10 days      B.  Norco 5/325 1 p.o. every 4 hours as needed pain x3 days (#20 tabs)      C.  Mupirocin ointment applied to the incision twice daily x1 week      D.  He may resume all of his other home medications. 8.  Discharge status stable

## 2017-11-01 NOTE — Discharge Instructions (Signed)
1.  Discharge this morning with his wife. 2.  Return to my office in 1 week for follow-up and staple removal. 3.  Cochlear implant activation will be performed in approximately 1 month. 4.  He should follow his regular ADA diet. 5.  Quite indoor activity with no lifting greater than 20 pounds. 6.  Water precautions left ear 7.  Discharge medications include:       A.  Cefzil 500 mg p.o. twice daily x10 days      B.  Norco 5/325 1 p.o. every 4 hours as needed pain x3 days (#20 tabs)      C.  Mupirocin ointment applied to the incision twice daily x1 week      D.  He may resume all of his other home medications. 8.  Discharge status stable

## 2017-11-14 DIAGNOSIS — Z794 Long term (current) use of insulin: Secondary | ICD-10-CM | POA: Diagnosis not present

## 2017-11-14 DIAGNOSIS — G4737 Central sleep apnea in conditions classified elsewhere: Secondary | ICD-10-CM | POA: Diagnosis not present

## 2017-11-14 DIAGNOSIS — I7389 Other specified peripheral vascular diseases: Secondary | ICD-10-CM | POA: Diagnosis not present

## 2017-11-14 DIAGNOSIS — I10 Essential (primary) hypertension: Secondary | ICD-10-CM | POA: Diagnosis not present

## 2017-11-14 DIAGNOSIS — Z6823 Body mass index (BMI) 23.0-23.9, adult: Secondary | ICD-10-CM | POA: Diagnosis not present

## 2017-11-14 DIAGNOSIS — R2681 Unsteadiness on feet: Secondary | ICD-10-CM | POA: Diagnosis not present

## 2017-11-14 DIAGNOSIS — M545 Low back pain: Secondary | ICD-10-CM | POA: Diagnosis not present

## 2017-11-14 DIAGNOSIS — E1151 Type 2 diabetes mellitus with diabetic peripheral angiopathy without gangrene: Secondary | ICD-10-CM | POA: Diagnosis not present

## 2017-12-04 DIAGNOSIS — G4733 Obstructive sleep apnea (adult) (pediatric): Secondary | ICD-10-CM | POA: Diagnosis not present

## 2017-12-04 DIAGNOSIS — H903 Sensorineural hearing loss, bilateral: Secondary | ICD-10-CM | POA: Diagnosis not present

## 2017-12-04 DIAGNOSIS — E108 Type 1 diabetes mellitus with unspecified complications: Secondary | ICD-10-CM | POA: Diagnosis not present

## 2017-12-10 DIAGNOSIS — G629 Polyneuropathy, unspecified: Secondary | ICD-10-CM | POA: Diagnosis not present

## 2017-12-10 DIAGNOSIS — G894 Chronic pain syndrome: Secondary | ICD-10-CM | POA: Diagnosis not present

## 2017-12-10 DIAGNOSIS — M545 Low back pain: Secondary | ICD-10-CM | POA: Diagnosis not present

## 2017-12-12 DIAGNOSIS — H903 Sensorineural hearing loss, bilateral: Secondary | ICD-10-CM | POA: Diagnosis not present

## 2017-12-21 DIAGNOSIS — E119 Type 2 diabetes mellitus without complications: Secondary | ICD-10-CM | POA: Diagnosis not present

## 2017-12-21 DIAGNOSIS — H2513 Age-related nuclear cataract, bilateral: Secondary | ICD-10-CM | POA: Diagnosis not present

## 2018-01-02 DIAGNOSIS — H903 Sensorineural hearing loss, bilateral: Secondary | ICD-10-CM | POA: Diagnosis not present

## 2018-02-04 DIAGNOSIS — H903 Sensorineural hearing loss, bilateral: Secondary | ICD-10-CM | POA: Diagnosis not present

## 2018-03-04 DIAGNOSIS — I10 Essential (primary) hypertension: Secondary | ICD-10-CM | POA: Diagnosis not present

## 2018-03-04 DIAGNOSIS — I7389 Other specified peripheral vascular diseases: Secondary | ICD-10-CM | POA: Diagnosis not present

## 2018-03-04 DIAGNOSIS — E7849 Other hyperlipidemia: Secondary | ICD-10-CM | POA: Diagnosis not present

## 2018-03-04 DIAGNOSIS — M545 Low back pain: Secondary | ICD-10-CM | POA: Diagnosis not present

## 2018-03-04 DIAGNOSIS — Z794 Long term (current) use of insulin: Secondary | ICD-10-CM | POA: Diagnosis not present

## 2018-03-04 DIAGNOSIS — E1151 Type 2 diabetes mellitus with diabetic peripheral angiopathy without gangrene: Secondary | ICD-10-CM | POA: Diagnosis not present

## 2018-03-04 DIAGNOSIS — Z6823 Body mass index (BMI) 23.0-23.9, adult: Secondary | ICD-10-CM | POA: Diagnosis not present

## 2018-03-04 DIAGNOSIS — F3289 Other specified depressive episodes: Secondary | ICD-10-CM | POA: Diagnosis not present

## 2018-03-04 DIAGNOSIS — Z720 Tobacco use: Secondary | ICD-10-CM | POA: Diagnosis not present

## 2018-03-15 DIAGNOSIS — H903 Sensorineural hearing loss, bilateral: Secondary | ICD-10-CM | POA: Diagnosis not present

## 2018-04-08 DIAGNOSIS — J209 Acute bronchitis, unspecified: Secondary | ICD-10-CM | POA: Diagnosis not present

## 2018-04-08 DIAGNOSIS — E1151 Type 2 diabetes mellitus with diabetic peripheral angiopathy without gangrene: Secondary | ICD-10-CM | POA: Diagnosis not present

## 2018-04-08 DIAGNOSIS — R05 Cough: Secondary | ICD-10-CM | POA: Diagnosis not present

## 2018-04-14 ENCOUNTER — Inpatient Hospital Stay (HOSPITAL_COMMUNITY)
Admission: EM | Admit: 2018-04-14 | Discharge: 2018-04-19 | DRG: 871 | Disposition: A | Discharge status: Home / Self Care | Payer: 59 | Attending: Family Medicine | Admitting: Family Medicine

## 2018-04-14 ENCOUNTER — Emergency Department (HOSPITAL_COMMUNITY): Payer: 59

## 2018-04-14 ENCOUNTER — Other Ambulatory Visit: Payer: Self-pay

## 2018-04-14 DIAGNOSIS — Z794 Long term (current) use of insulin: Secondary | ICD-10-CM

## 2018-04-14 DIAGNOSIS — H9190 Unspecified hearing loss, unspecified ear: Secondary | ICD-10-CM | POA: Diagnosis present

## 2018-04-14 DIAGNOSIS — G4737 Central sleep apnea in conditions classified elsewhere: Secondary | ICD-10-CM | POA: Diagnosis present

## 2018-04-14 DIAGNOSIS — G4731 Primary central sleep apnea: Secondary | ICD-10-CM | POA: Diagnosis present

## 2018-04-14 DIAGNOSIS — J189 Pneumonia, unspecified organism: Secondary | ICD-10-CM | POA: Diagnosis present

## 2018-04-14 DIAGNOSIS — I10 Essential (primary) hypertension: Secondary | ICD-10-CM | POA: Diagnosis present

## 2018-04-14 DIAGNOSIS — E10319 Type 1 diabetes mellitus with unspecified diabetic retinopathy without macular edema: Secondary | ICD-10-CM | POA: Diagnosis present

## 2018-04-14 DIAGNOSIS — F329 Major depressive disorder, single episode, unspecified: Secondary | ICD-10-CM | POA: Diagnosis present

## 2018-04-14 DIAGNOSIS — Z7982 Long term (current) use of aspirin: Secondary | ICD-10-CM | POA: Diagnosis not present

## 2018-04-14 DIAGNOSIS — E1042 Type 1 diabetes mellitus with diabetic polyneuropathy: Secondary | ICD-10-CM | POA: Diagnosis present

## 2018-04-14 DIAGNOSIS — R0902 Hypoxemia: Secondary | ICD-10-CM | POA: Diagnosis not present

## 2018-04-14 DIAGNOSIS — A419 Sepsis, unspecified organism: Principal | ICD-10-CM | POA: Diagnosis present

## 2018-04-14 DIAGNOSIS — N183 Chronic kidney disease, stage 3 (moderate): Secondary | ICD-10-CM | POA: Diagnosis present

## 2018-04-14 DIAGNOSIS — F1190 Opioid use, unspecified, uncomplicated: Secondary | ICD-10-CM

## 2018-04-14 DIAGNOSIS — M1990 Unspecified osteoarthritis, unspecified site: Secondary | ICD-10-CM | POA: Diagnosis present

## 2018-04-14 DIAGNOSIS — J9601 Acute respiratory failure with hypoxia: Secondary | ICD-10-CM | POA: Diagnosis present

## 2018-04-14 DIAGNOSIS — R062 Wheezing: Secondary | ICD-10-CM | POA: Diagnosis not present

## 2018-04-14 DIAGNOSIS — J181 Lobar pneumonia, unspecified organism: Secondary | ICD-10-CM

## 2018-04-14 DIAGNOSIS — Z79891 Long term (current) use of opiate analgesic: Secondary | ICD-10-CM

## 2018-04-14 DIAGNOSIS — R6520 Severe sepsis without septic shock: Secondary | ICD-10-CM | POA: Diagnosis present

## 2018-04-14 DIAGNOSIS — R0689 Other abnormalities of breathing: Secondary | ICD-10-CM | POA: Diagnosis not present

## 2018-04-14 DIAGNOSIS — J1001 Influenza due to other identified influenza virus with the same other identified influenza virus pneumonia: Secondary | ICD-10-CM | POA: Diagnosis present

## 2018-04-14 DIAGNOSIS — E108 Type 1 diabetes mellitus with unspecified complications: Secondary | ICD-10-CM | POA: Diagnosis present

## 2018-04-14 DIAGNOSIS — Y95 Nosocomial condition: Secondary | ICD-10-CM | POA: Diagnosis present

## 2018-04-14 DIAGNOSIS — E785 Hyperlipidemia, unspecified: Secondary | ICD-10-CM | POA: Diagnosis present

## 2018-04-14 DIAGNOSIS — J101 Influenza due to other identified influenza virus with other respiratory manifestations: Secondary | ICD-10-CM | POA: Diagnosis present

## 2018-04-14 DIAGNOSIS — Z885 Allergy status to narcotic agent status: Secondary | ICD-10-CM

## 2018-04-14 DIAGNOSIS — F17210 Nicotine dependence, cigarettes, uncomplicated: Secondary | ICD-10-CM | POA: Diagnosis present

## 2018-04-14 DIAGNOSIS — G894 Chronic pain syndrome: Secondary | ICD-10-CM | POA: Diagnosis present

## 2018-04-14 DIAGNOSIS — I129 Hypertensive chronic kidney disease with stage 1 through stage 4 chronic kidney disease, or unspecified chronic kidney disease: Secondary | ICD-10-CM | POA: Diagnosis present

## 2018-04-14 DIAGNOSIS — Z833 Family history of diabetes mellitus: Secondary | ICD-10-CM

## 2018-04-14 DIAGNOSIS — F419 Anxiety disorder, unspecified: Secondary | ICD-10-CM | POA: Diagnosis present

## 2018-04-14 DIAGNOSIS — R0602 Shortness of breath: Secondary | ICD-10-CM | POA: Diagnosis not present

## 2018-04-14 DIAGNOSIS — Z9621 Cochlear implant status: Secondary | ICD-10-CM | POA: Diagnosis present

## 2018-04-14 DIAGNOSIS — Z881 Allergy status to other antibiotic agents status: Secondary | ICD-10-CM | POA: Diagnosis not present

## 2018-04-14 DIAGNOSIS — Z8042 Family history of malignant neoplasm of prostate: Secondary | ICD-10-CM

## 2018-04-14 DIAGNOSIS — Z8249 Family history of ischemic heart disease and other diseases of the circulatory system: Secondary | ICD-10-CM

## 2018-04-14 DIAGNOSIS — Z20828 Contact with and (suspected) exposure to other viral communicable diseases: Secondary | ICD-10-CM | POA: Diagnosis present

## 2018-04-14 DIAGNOSIS — R000 Tachycardia, unspecified: Secondary | ICD-10-CM | POA: Diagnosis not present

## 2018-04-14 DIAGNOSIS — J9602 Acute respiratory failure with hypercapnia: Secondary | ICD-10-CM | POA: Diagnosis present

## 2018-04-14 DIAGNOSIS — R404 Transient alteration of awareness: Secondary | ICD-10-CM | POA: Diagnosis not present

## 2018-04-14 LAB — URINALYSIS, ROUTINE W REFLEX MICROSCOPIC
BACTERIA UA: NONE SEEN
Bilirubin Urine: NEGATIVE
GLUCOSE, UA: NEGATIVE mg/dL
HGB URINE DIPSTICK: NEGATIVE
KETONES UR: 5 mg/dL — AB
LEUKOCYTE UA: NEGATIVE
Nitrite: NEGATIVE
Specific Gravity, Urine: 1.015 (ref 1.005–1.030)
pH: 6 (ref 5.0–8.0)

## 2018-04-14 LAB — COMPREHENSIVE METABOLIC PANEL
ALBUMIN: 4.2 g/dL (ref 3.5–5.0)
ALK PHOS: 72 U/L (ref 38–126)
ALT: 15 U/L (ref 0–44)
AST: 17 U/L (ref 15–41)
Anion gap: 10 (ref 5–15)
BILIRUBIN TOTAL: 0.7 mg/dL (ref 0.3–1.2)
BUN: 23 mg/dL (ref 8–23)
CALCIUM: 8.8 mg/dL — AB (ref 8.9–10.3)
CO2: 28 mmol/L (ref 22–32)
Chloride: 101 mmol/L (ref 98–111)
Creatinine, Ser: 1.27 mg/dL — ABNORMAL HIGH (ref 0.61–1.24)
GFR calc Af Amer: >60 mL/min (ref >60)
GFR calc non Af Amer: 59 mL/min — ABNORMAL LOW (ref >60)
GLUCOSE: 186 mg/dL — AB (ref 70–99)
Potassium: 4.5 mmol/L (ref 3.5–5.1)
SODIUM: 139 mmol/L (ref 135–145)
TOTAL PROTEIN: 7.6 g/dL (ref 6.5–8.1)

## 2018-04-14 LAB — CBC WITH DIFFERENTIAL/PLATELET
Abs Immature Granulocytes: 0.07 10*3/uL (ref 0.00–0.07)
BASOS PCT: 0 %
Basophils Absolute: 0 10*3/uL (ref 0.0–0.1)
EOS ABS: 0 10*3/uL (ref 0.0–0.5)
EOS PCT: 0 %
HEMATOCRIT: 41.7 % (ref 39.0–52.0)
Hemoglobin: 13.4 g/dL (ref 13.0–17.0)
IMMATURE GRANULOCYTES: 1 %
LYMPHS ABS: 0.7 10*3/uL (ref 0.7–4.0)
Lymphocytes Relative: 7 %
MCH: 29.1 pg (ref 26.0–34.0)
MCHC: 32.1 g/dL (ref 30.0–36.0)
MCV: 90.5 fL (ref 80.0–100.0)
Monocytes Absolute: 0.6 10*3/uL (ref 0.1–1.0)
Monocytes Relative: 6 %
NEUTROS PCT: 86 %
Neutro Abs: 9.1 10*3/uL — ABNORMAL HIGH (ref 1.7–7.7)
Platelets: 198 10*3/uL (ref 150–400)
RBC: 4.61 MIL/uL (ref 4.22–5.81)
RDW: 14.1 % (ref 11.5–15.5)
WBC: 10.5 10*3/uL (ref 4.0–10.5)
nRBC: 0.2 % (ref 0.0–0.2)

## 2018-04-14 LAB — BLOOD GAS, ARTERIAL
Acid-Base Excess: 3.3 mmol/L — ABNORMAL HIGH (ref 0.0–2.0)
Bicarbonate: 28.8 mmol/L — ABNORMAL HIGH (ref 20.0–28.0)
Drawn by: 232811
O2 Content: 15 L/min
O2 Saturation: 88 %
Patient temperature: 103
pCO2 arterial: 56 mmHg — ABNORMAL HIGH (ref 32.0–48.0)
pH, Arterial: 7.345 — ABNORMAL LOW (ref 7.350–7.450)
pO2, Arterial: 68.9 mmHg — ABNORMAL LOW (ref 83.0–108.0)

## 2018-04-14 LAB — LACTIC ACID, PLASMA: Lactic Acid, Venous: 1.9 mmol/L (ref 0.5–1.9)

## 2018-04-14 LAB — VALPROIC ACID LEVEL: VALPROIC ACID LVL: 27 ug/mL — AB (ref 50.0–100.0)

## 2018-04-14 MED ORDER — SODIUM CHLORIDE 0.9 % IV SOLN
1000.0000 mL | INTRAVENOUS | Status: DC
  Administered 2018-04-14 – 2018-04-15 (×2): 1000 mL via INTRAVENOUS

## 2018-04-14 MED ORDER — ALBUTEROL (5 MG/ML) CONTINUOUS INHALATION SOLN: 10.0000 mg/h | INHALATION_SOLUTION | Freq: Once | RESPIRATORY_TRACT | Status: DC

## 2018-04-14 MED ORDER — ALBUTEROL SULFATE HFA 108 (90 BASE) MCG/ACT IN AERS
2.0000 | INHALATION_SPRAY | Freq: Once | RESPIRATORY_TRACT | Status: AC
  Administered 2018-04-14: 2 via RESPIRATORY_TRACT
  Filled 2018-04-14: qty 6.7

## 2018-04-14 MED ORDER — MAGNESIUM SULFATE 2 GM/50ML IV SOLN
2.0000 g | INTRAVENOUS | Status: AC
  Administered 2018-04-14: 2 g via INTRAVENOUS
  Filled 2018-04-14: qty 50

## 2018-04-14 MED ORDER — SODIUM CHLORIDE 0.9 % IV SOLN
1.0000 g | Freq: Once | INTRAVENOUS | Status: AC
  Administered 2018-04-14: 1 g via INTRAVENOUS
  Filled 2018-04-14: qty 10

## 2018-04-14 MED ORDER — ALBUTEROL SULFATE (2.5 MG/3ML) 0.083% IN NEBU: 5.0000 mg | INHALATION_SOLUTION | Freq: Once | RESPIRATORY_TRACT | Status: DC

## 2018-04-14 MED ORDER — ACETAMINOPHEN 500 MG PO TABS
1000.0000 mg | ORAL_TABLET | Freq: Once | ORAL | Status: AC
  Administered 2018-04-14: 1000 mg via ORAL
  Filled 2018-04-14: qty 2

## 2018-04-14 MED ORDER — SODIUM CHLORIDE 0.9 % IV SOLN
500.0000 mg | Freq: Once | INTRAVENOUS | Status: AC
  Administered 2018-04-14: 500 mg via INTRAVENOUS
  Filled 2018-04-14: qty 500

## 2018-04-14 NOTE — ED Notes (Signed)
Bed: RW41 Expected date:  Expected time:  Means of arrival:  Comments: EMS 63 yo male fever and cough x 1 week 103.1/wheezing-NRB

## 2018-04-14 NOTE — ED Triage Notes (Signed)
Patient arrived with EMS from home c/o SOB and fever started a week ago. Per EMS pt o2sat was 70% on RA, temp 103. No travel history reported.

## 2018-04-14 NOTE — ED Notes (Signed)
Patient unable to hear RN's. RN Fara Chute and Museum/gallery conservator in room. Questions written down on paper to get answers from patient. Patient normally reads lips and does not understand why RN could not take her mask off.

## 2018-04-14 NOTE — ED Notes (Signed)
Urine culture sent to the lab. 

## 2018-04-14 NOTE — H&P (Signed)
History and Physical   [name] SNK:[id number] DOB: 1955-09-18 DOA: 04/14/2018  Referring MD/NP/PA: Dr. Vivi Martens  PCP: Leanna Battles, MD   Outpatient Specialists: None  Patient coming from: Home  Chief Complaint: Shortness of breath  HPI: [name] is a 63 y.o. male with medical history significant of diabetes, hypertension, chronic pain syndrome, history of cochlear implant, anxiety disorder, hyperlipidemia, tobacco abuse, and chronic pain syndrome on methadone who was diagnosed with pneumonia apparently about a week ago.  He was initiated on antibiotics by his primary care physician.  He has been taking it his medication with no significant change.  Patient is worried and has gotten worse.  Breathing has been much worse.  He therefore came to the ER for further treatment.  He denied any fever or chill he has been having more worsening shortness of breath.  Today she patient came in hypoxic and hypercarbic.  He is therefore being admitted to the hospital with pneumonia.  Patient has significant right-sided pneumonia at this point.  He also has evidence of sepsis with fever chills.  Patient has been requiring up to 15 L of oxygen at the moment with nonrebreather back.  He has therefore been classified as a high risks for COVID-19 although no exposure to someone with known disease..  ED Course: Temperature is 102.7, blood pressure 178/64 pulse 94 respiratory of 28 oxygen sat 88% on room air currently 95% on 15 L nonrebreather back.  ABG showed a pH of 7.345 PCO2 of 56 and PO2 of 68.  Sailer count is 10.5 hemoglobin 13.4 platelet 198.  Sodium 139 potassium 4.5 chloride 101 CO2 28 BUN 23 creatinine 1.27 and calcium 8.8.  Lactic acid is 1.9.  Chest x-ray showed right upper and lower lobe infiltrate.  Patient initiated on antibiotics and being admitted for treatment.  Review of Systems: As per HPI otherwise 10 point review of systems negative.    Past Medical History:  Diagnosis Date  .  Anxiety   . Arthritis   . Chronic narcotic use   . Complication of anesthesia    h/o aspiration during back surgery  . Depression   . Diabetic peripheral neuropathy ([place])   . Hearing loss    wears bilateral hearing aids, bilat moderate SNHL  . Hyperlipidemia   . Hypertension   . Insomnia   . Tobacco abuse   . Type 1 diabetes mellitus ([place])   . Ulcer of left ankle ([place])   . Vision abnormalities    mild DM retinopathy    Past Surgical History:  Procedure Laterality Date  . COCHLEAR IMPLANT Left 10/31/2017   Procedure: COCHLEAR IMPLANT LEFT EAR;  Surgeon: Vicie Mutters, MD;  Location: [facility];  Service: ENT;  Laterality: Left;  . COLONOSCOPY WITH PROPOFOL  07-26-2010  . [place] had 16 tons of steel rolled over his feet  . LUMBAR LAMINECTOMY/DECOMPRESSION MICRODISCECTOMY  12-23-1997   LEFT  L5 -- S1  . TONSILLECTOMY       reports that he has been smoking cigarettes. He has a 40.00 pack-year smoking history. He has never used smokeless tobacco. He reports that he does not drink alcohol or use drugs.  Allergies  Allergen Reactions  . Codeine Nausea And Vomiting  . Tetracyclines & Related Hives    Family History  Problem Relation Age of Onset  . Prostate cancer Paternal Uncle   . Prostate cancer Maternal Grandfather   . Diabetes Mother   .  Hypertension Mother   . Diabetes Father   . Heart failure Father      Prior to Admission medications   Medication Sig Start Date End Date Taking? Authorizing Provider  amLODipine (NORVASC) 5 MG tablet Take 5 mg by mouth daily.    [provider]  aspirin EC 81 MG tablet Take 81 mg by mouth daily.    [provider]  benzonatate (TESSALON) 100 MG capsule Take 1 capsule by mouth as needed.  06/24/13   [provider]  divalproex (DEPAKOTE) 250 MG 24 hr tablet Take 250 mg by mouth daily.      [provider]  gabapentin (NEURONTIN) 300 MG capsule Take 600 mg by mouth 3  (three) times daily.      [provider]  glipiZIDE (GLUCOTROL) 5 MG tablet Take 5 mg by mouth daily.      [provider]  hydrochlorothiazide (HYDRODIURIL) 25 MG tablet Take 25 mg by mouth daily.    [provider]  HYDROcodone-acetaminophen (NORCO/VICODIN) 5-325 MG tablet Take 1 tablet by mouth 3 (three) times daily as needed.     [provider]  Insulin Glargine (BASAGLAR KWIKPEN) 100 UNIT/ML SOPN Inject 100 Units into the skin daily.    [provider]  losartan (COZAAR) 100 MG tablet Take 100 mg by mouth daily.    [provider]  metFORMIN (GLUCOPHAGE) 500 MG tablet Take 500 mg by mouth 2 (two) times daily with a meal.     [provider]  methadone (DOLOPHINE) 5 MG tablet Take 5 mg by mouth 3 (three) times daily.      [provider]  methocarbamol (ROBAXIN) 500 MG tablet Take 500 mg by mouth as needed. Muscle spasm     [provider]  metoprolol (LOPRESSOR) 50 MG tablet Take 50 mg by mouth. 2 pills qam, 1 pill qpm    [provider]  nortriptyline (PAMELOR) 25 MG capsule Take 25 mg by mouth at bedtime.      [provider]  ondansetron (ZOFRAN-ODT) 4 MG disintegrating tablet Take 4 mg by mouth daily.      [provider]  sertraline (ZOLOFT) 50 MG tablet Take 50 mg by mouth daily.      [provider]  simvastatin (ZOCOR) 20 MG tablet Take 20 mg by mouth at bedtime.      [provider]    Physical Exam: Vitals:   04/14/18 2230 04/14/18 2238 04/14/18 2245 04/14/18 2300  BP:  (!) 115/97  (!) 139/104  Pulse: 95 93 88 90  Resp: (!) 22 (!) 24 (!) 22 14  Temp:      TempSrc:      SpO2: (!) 86% (!) 88% 92% 95%  Weight:      Height:          Constitutional: NAD, calm, very anxious Vitals:   04/14/18 2230 04/14/18 2238 04/14/18 2245 04/14/18 2300  BP:  (!) 115/97  (!) 139/104  Pulse: 95 93 88 90  Resp: (!) 22 (!) 24 (!) 22 14  Temp:      TempSrc:       SpO2: (!) 86% (!) 88% 92% 95%  Weight:      Height:       Eyes: PERRL, lids and conjunctivae normal ENMT: Mucous membranes are moist. Posterior pharynx clear of any exudate or lesions.Normal dentition.  Neck: normal, supple, no masses, no thyromegaly Respiratory: Decreased air entry at the right base, coarse breath  sounds bilaterally, no wheezing, no crackles. Normal respiratory effort. No accessory muscle use.  Cardiovascular: Sinus tachycardia, no murmurs / rubs / gallops. No extremity edema. 2+ pedal pulses. No carotid bruits.  Abdomen: no tenderness, no masses palpated. No hepatosplenomegaly. Bowel sounds positive.  Musculoskeletal: no clubbing / cyanosis. No joint deformity upper and lower extremities. Good ROM, no contractures. Normal muscle tone.  Skin: no rashes, lesions, ulcers. No induration Neurologic: CN 2-12 grossly intact. Sensation intact, DTR normal. Strength 5/5 in all 4.  Psychiatric: Normal judgment and insight. Alert and oriented x 3. Normal mood.  Patient is very anxious    Labs on Admission: I have personally reviewed following labs and imaging studies  CBC: Recent Labs  Lab 04/14/18 2114  WBC 10.5  NEUTROABS 9.1*  HGB 13.4  HCT 41.7  MCV 90.5  PLT 242   Basic Metabolic Panel: Recent Labs  Lab 04/14/18 2114  NA 139  K 4.5  CL 101  CO2 28  GLUCOSE 186*  BUN 23  CREATININE 1.27*  CALCIUM 8.8*   GFR: Estimated Creatinine Clearance: 60.7 mL/min (A) (by C-G formula based on SCr of 1.27 mg/dL (H)). Liver Function Tests: Recent Labs  Lab 04/14/18 2114  AST 17  ALT 15  ALKPHOS 72  BILITOT 0.7  PROT 7.6  ALBUMIN 4.2   No results for input(s): LIPASE, AMYLASE in the last 168 hours. No results for input(s): AMMONIA in the last 168 hours. Coagulation Profile: No results for input(s): INR, PROTIME in the last 168 hours. Cardiac Enzymes: No results for input(s): CKTOTAL, CKMB, CKMBINDEX, TROPONINI in the last 168 hours. BNP (last 3 results) No  results for input(s): PROBNP in the last 8760 hours. HbA1C: No results for input(s): HGBA1C in the last 72 hours. CBG: No results for input(s): GLUCAP in the last 168 hours. Lipid Profile: No results for input(s): CHOL, HDL, LDLCALC, TRIG, CHOLHDL, LDLDIRECT in the last 72 hours. Thyroid Function Tests: No results for input(s): TSH, T4TOTAL, FREET4, T3FREE, THYROIDAB in the last 72 hours. Anemia Panel: No results for input(s): VITAMINB12, FOLATE, FERRITIN, TIBC, IRON, RETICCTPCT in the last 72 hours. Urine analysis:    Component Value Date/Time   COLORURINE YELLOW 04/14/2018 2114   APPEARANCEUR CLEAR 04/14/2018 2114   LABSPEC 1.015 04/14/2018 2114   PHURINE 6.0 04/14/2018 2114   GLUCOSEU NEGATIVE 04/14/2018 2114   HGBUR NEGATIVE 04/14/2018 2114   BILIRUBINUR NEGATIVE 04/14/2018 2114   KETONESUR 5 (A) 04/14/2018 2114   PROTEINUR >=300 (A) 04/14/2018 2114   NITRITE NEGATIVE 04/14/2018 2114   LEUKOCYTESUR NEGATIVE 04/14/2018 2114   Sepsis Labs: @LABRCNTIP (procalcitonin:4,lacticidven:4) )No results found for this or any previous visit (from the past 240 hour(s)).   Radiological Exams on Admission: Dg Chest Port 1 View  Result Date: 04/14/2018 CLINICAL DATA:  Short of breath. EXAM: PORTABLE CHEST 1 VIEW COMPARISON:  None. FINDINGS: Normal heart size. No pleural effusion or edema. There are a few ill-defined patchy opacities within the right upper lobe and right lower lobe. Left lung appears clear. IMPRESSION: 1. Ill-defined, patchy opacities noted within the right upper lobe and right lower lobe. Electronically Signed   By: Kerby Moors M.D.   On: 04/14/2018 22:21      Assessment/Plan Principal Problem:   Acute respiratory failure with hypoxia and hypercapnia (HCC) Active Problems:   Healthcare-associated pneumonia   Dyslipidemia   Essential hypertension   Severe central sleep apnea comorbid with prescribed opioid use   Diabetes mellitus type 1 with manifestations ([place])  Sepsis ([place])     #1 acute respiratory failure with hypoxia and hypercarbia:This is most likely due to pneumonia.  Patient also chronic smoker but no significant wheezing.  He is requiring up to 15 L of oxygen.  Aspiration pneumonia is also likely.  Patient has no known contact but will be ruled out with COVID-19 testing.  Based on the criteria he could be classified as high risk with 50 L of oxygen.  Initiate treatment for community-acquired pneumonia since last hospitalization was 4 months ago.  Sputum and blood cultures will be obtained.  Oxygen as well as nebulizer treatments.  #2 diabetes: Insulin-dependent type 1.  Continue home regimen with sliding scale insulin.  #3 pneumonia: Community-acquired versus viral pneumonia.  Treat with Rocephin and Zithromax.  He has had no admission in the last 3 months.  #4 sleep apnea: CPAP if tolerated.  #5 hypertension: Continue with blood pressure medications from home.  #6 hyperlipidemia: Continue with home regimen.  #7 chronic pain syndrome: Patient is on methadone at home.  Continue in the hospital.   DVT prophylaxis: Lovenox Code Status: Full code Family Communication: No family at bedside Disposition Plan: To be determined Consults called: None Admission status: Inpatient  Severity of Illness: The appropriate patient status for this patient is INPATIENT. Inpatient status is judged to be reasonable and necessary in order to provide the required intensity of service to ensure the patient's safety. [name] presenting symptoms, physical exam findings, and initial radiographic and laboratory data in the context of their chronic comorbidities is felt to place them at high risk for further clinical deterioration. Furthermore, it is not anticipated that the patient will be medically stable for discharge from the hospital within 2 midnights of admission. The following factors support the patient status of inpatient.   " [name] presenting  symptoms include shortness of breath and cough. " The worrisome physical exam findings include decreased air entry at the right lung with coarse breath sounds. " The initial radiographic and laboratory data are worrisome because of chest x-ray showed right-sided pneumonia. " The chronic co-morbidities include diabetes and hypertension.   * I certify that at the point of admission it is my clinical judgment that the patient will require inpatient hospital care spanning beyond 2 midnights from the point of admission due to high intensity of service, high risk for further deterioration and high frequency of surveillance required.Barbette Merino MD [facility] Pager [phone]  If 7PM-7AM, please contact night-coverage [email or web address] Password [facility]  04/14/2018, 11:34 PM

## 2018-04-14 NOTE — ED Provider Notes (Signed)
[facility] DEPT Provider Note   CSN: [id number] Arrival date & time: 04/14/18  2102    History   Chief Complaint Chief Complaint  Patient presents with  . Shortness of Breath  . Fever    HPI [name] is a 63 y.o. male.     HPI Patient presents to the emergency department with respiratory distress and cough over the last week.  Patient noticed that there was a fever today.  States that he does not know of any chronic lung issues.  Patient states that nothing seems to make the condition better but activity seems to make him worse.  The patient denies pain headache,blurred vision, neck pain, weakness, numbness, dizziness, anorexia, edema, abdominal pain, nausea, vomiting, diarrhea, rash, back pain, dysuria, hematemesis, bloody stool, near syncope, or syncope. Past Medical History:  Diagnosis Date  . Anxiety   . Arthritis   . Chronic narcotic use   . Complication of anesthesia    h/o aspiration during back surgery  . Depression   . Diabetic peripheral neuropathy ([place])   . Hearing loss    wears bilateral hearing aids, bilat moderate SNHL  . Hyperlipidemia   . Hypertension   . Insomnia   . Tobacco abuse   . Type 1 diabetes mellitus ([place])   . Ulcer of left ankle ([place])   . Vision abnormalities    mild DM retinopathy    Patient Active Problem List   Diagnosis Date Noted  . Postop check 10/31/2017  . Severe central sleep apnea comorbid with prescribed opioid use 10/14/2017  . Insomnia secondary to chronic pain 08/01/2017  . Contact with and (suspected) exposure to environmental tobacco smoke (acute) (chronic) 08/01/2017  . Snoring 08/01/2017  . Venous insufficiency (chronic) (peripheral) 10/04/2016  . Essential hypertension 08/01/2013  . Chronic ulcer of left ankle ([place]) 08/01/2013  . Type I (juvenile type) diabetes mellitus with neurological manifestations, not stated as uncontrolled(250.61) 10/03/2012  . Unspecified essential  hypertension 10/03/2012  . Dyslipidemia 10/03/2012  . Insomnia 10/03/2012  . Pain in joint, lower leg 10/02/2012  . Special screening for malignant neoplasms, colon 07/26/2010    Past Surgical History:  Procedure Laterality Date  . COCHLEAR IMPLANT Left 10/31/2017   Procedure: COCHLEAR IMPLANT LEFT EAR;  Surgeon: Vicie Mutters, MD;  Location: [facility];  Service: ENT;  Laterality: Left;  . COLONOSCOPY WITH PROPOFOL  07-26-2010  . [place] had 16 tons of steel rolled over his feet  . LUMBAR LAMINECTOMY/DECOMPRESSION MICRODISCECTOMY  12-23-1997   LEFT  L5 -- S1  . TONSILLECTOMY          Home Medications    Prior to Admission medications   Medication Sig Start Date End Date Taking? Authorizing Provider  amLODipine (NORVASC) 5 MG tablet Take 5 mg by mouth daily.    [provider]  aspirin EC 81 MG tablet Take 81 mg by mouth daily.    [provider]  benzonatate (TESSALON) 100 MG capsule Take 1 capsule by mouth as needed.  06/24/13   [provider]  divalproex (DEPAKOTE) 250 MG 24 hr tablet Take 250 mg by mouth daily.      [provider]  gabapentin (NEURONTIN) 300 MG capsule Take 600 mg by mouth 3 (three) times daily.      [provider]  glipiZIDE (GLUCOTROL) 5 MG tablet Take 5 mg by mouth daily.      [provider]  hydrochlorothiazide (  HYDRODIURIL) 25 MG tablet Take 25 mg by mouth daily.    [provider]  HYDROcodone-acetaminophen (NORCO/VICODIN) 5-325 MG tablet Take 1 tablet by mouth 3 (three) times daily as needed.     [provider]  Insulin Glargine (BASAGLAR KWIKPEN) 100 UNIT/ML SOPN Inject 100 Units into the skin daily.    [provider]  losartan (COZAAR) 100 MG tablet Take 100 mg by mouth daily.    [provider]  metFORMIN (GLUCOPHAGE) 500 MG tablet Take 500 mg by mouth 2 (two) times daily with a meal.     [provider]  methadone  (DOLOPHINE) 5 MG tablet Take 5 mg by mouth 3 (three) times daily.      [provider]  methocarbamol (ROBAXIN) 500 MG tablet Take 500 mg by mouth as needed. Muscle spasm     [provider]  metoprolol (LOPRESSOR) 50 MG tablet Take 50 mg by mouth. 2 pills qam, 1 pill qpm    [provider]  nortriptyline (PAMELOR) 25 MG capsule Take 25 mg by mouth at bedtime.      [provider]  ondansetron (ZOFRAN-ODT) 4 MG disintegrating tablet Take 4 mg by mouth daily.      [provider]  sertraline (ZOLOFT) 50 MG tablet Take 50 mg by mouth daily.      [provider]  simvastatin (ZOCOR) 20 MG tablet Take 20 mg by mouth at bedtime.      [provider]    Family History Family History  Problem Relation Age of Onset  . Prostate cancer Paternal Uncle   . Prostate cancer Maternal Grandfather   . Diabetes Mother   . Hypertension Mother   . Diabetes Father   . Heart failure Father     Social History Social History   Tobacco Use  . Smoking status: Current Every Day Smoker    Packs/day: 1.00    Years: 40.00    Pack years: 40.00    Types: Cigarettes  . Smokeless tobacco: Never Used  Substance Use Topics  . Alcohol use: No    Alcohol/week: 0.0 standard drinks  . Drug use: No     Allergies   Codeine and Tetracyclines & related   Review of Systems Review of Systems  All other systems negative except as documented in the HPI. All pertinent positives and negatives as reviewed in the HPI. Physical Exam Updated Vital Signs BP (!) 178/64 (BP Location: Left Arm)   Pulse 94   Temp (!) 102.7 F (39.3 C) (Rectal)   Resp (!) 28   Ht 5\' 10"  (1.778 m)   Wt 81.6 kg   SpO2 (!) 88%   BMI 25.83 kg/m   Physical Exam Vitals signs and nursing note reviewed.  Constitutional:      General: He is not in acute distress.    Appearance: He is well-developed.  HENT:     Head: Normocephalic and atraumatic.  Eyes:     Pupils: Pupils  are equal, round, and reactive to light.  Neck:     Musculoskeletal: Normal range of motion and neck supple.  Cardiovascular:     Rate and Rhythm: Normal rate and regular rhythm.     Heart sounds: Normal heart sounds. No murmur. No friction rub. No gallop.   Pulmonary:     Effort: Tachypnea and respiratory distress present.     Breath sounds: Examination of the right-upper field reveals decreased breath sounds and wheezing. Examination of the left-upper field  reveals decreased breath sounds and wheezing. Examination of the right-middle field reveals decreased breath sounds. Examination of the left-middle field reveals decreased breath sounds. Examination of the right-lower field reveals decreased breath sounds. Examination of the left-lower field reveals decreased breath sounds. Decreased breath sounds and wheezing present. No rhonchi or rales.     Comments: Patient has very tight decreased breath sounds Abdominal:     General: Bowel sounds are normal. There is no distension.     Palpations: Abdomen is soft.     Tenderness: There is no abdominal tenderness.  Skin:    General: Skin is warm and dry.     Capillary Refill: Capillary refill takes less than 2 seconds.     Findings: No erythema or rash.  Neurological:     Mental Status: He is alert and oriented to person, place, and time.     Motor: No abnormal muscle tone.     Coordination: Coordination normal.  Psychiatric:        Behavior: Behavior normal.      ED Treatments / Results  Labs (all labs ordered are listed, but only abnormal results are displayed) Labs Reviewed  COMPREHENSIVE METABOLIC PANEL - Abnormal; Notable for the following components:      Result Value   Glucose, Bld 186 (*)    Creatinine, Ser 1.27 (*)    Calcium 8.8 (*)    GFR calc non Af Amer 59 (*)    All other components within normal limits  CBC WITH DIFFERENTIAL/PLATELET - Abnormal; Notable for the following components:   Neutro Abs 9.1 (*)    All other  components within normal limits  VALPROIC ACID LEVEL - Abnormal; Notable for the following components:   Valproic Acid Lvl 27 (*)    All other components within normal limits  BLOOD GAS, ARTERIAL - Abnormal; Notable for the following components:   pH, Arterial 7.345 (*)    pCO2 arterial 56.0 (*)    pO2, Arterial 68.9 (*)    Bicarbonate 28.8 (*)    Acid-Base Excess 3.3 (*)    All other components within normal limits  CULTURE, BLOOD (ROUTINE X 2)  CULTURE, BLOOD (ROUTINE X 2)  NOVEL CORONAVIRUS, NAA (HOSPITAL ORDER, SEND-OUT TO REF LAB)  LACTIC ACID, PLASMA  LACTIC ACID, PLASMA  URINALYSIS, ROUTINE W REFLEX MICROSCOPIC    EKG None  Radiology Dg Chest Port 1 View  Result Date: 04/14/2018 CLINICAL DATA:  Short of breath. EXAM: PORTABLE CHEST 1 VIEW COMPARISON:  None. FINDINGS: Normal heart size. No pleural effusion or edema. There are a few ill-defined patchy opacities within the right upper lobe and right lower lobe. Left lung appears clear. IMPRESSION: 1. Ill-defined, patchy opacities noted within the right upper lobe and right lower lobe. Electronically Signed   By: Kerby Moors M.D.   On: 04/14/2018 22:21    Procedures Procedures (including critical care time)  Medications Ordered in ED Medications  0.9 %  sodium chloride infusion (1,000 mLs Intravenous New Bag/Given 04/14/18 2149)  magnesium sulfate IVPB 2 g 50 mL (2 g Intravenous New Bag/Given 04/14/18 2149)  cefTRIAXone (ROCEPHIN) 1 g in sodium chloride 0.9 % 100 mL IVPB (1 g Intravenous New Bag/Given 04/14/18 2242)  azithromycin (ZITHROMAX) 500 mg in sodium chloride 0.9 % 250 mL IVPB (500 mg Intravenous New Bag/Given 04/14/18 2241)  albuterol (PROVENTIL HFA;VENTOLIN HFA) 108 (90 Base) MCG/ACT inhaler 2 puff (2 puffs Inhalation Given 04/14/18 2153)  acetaminophen (TYLENOL) tablet 1,000 mg (1,000 mg Oral Given 04/14/18 2245)  Initial Impression / Assessment and Plan / ED Course  I have reviewed the triage vital signs and  the nursing notes.  Pertinent labs & imaging results that were available during my care of the patient were reviewed by me and considered in my medical decision making (see chart for details).       Patient is significantly ill with what most likely is COVID-19 based on his presentation.  Patient is mentating appropriately at this time and is maintaining in the lower 90s upper 80s on his pulse ox with 15 L.  Will be admitted to the hospital.   [name] was evaluated in Emergency Department on 04/14/2018 for the symptoms described in the history of present illness. He was evaluated in the context of the global COVID-19 pandemic, which necessitated consideration that the patient might be at risk for infection with the SARS-CoV-2 virus that causes COVID-19. Institutional protocols and algorithms that pertain to the evaluation of patients at risk for COVID-19 are in a state of rapid change based on information released by regulatory bodies including the CDC and federal and state organizations. These policies and algorithms were followed during the patient's care in the ED.    Final Clinical Impressions(s) / ED Diagnoses   Final diagnoses:  None    ED Discharge Orders    None       Rebeca Allegra 04/14/18 2304    Lacretia Leigh, MD 04/16/18 1347

## 2018-04-14 NOTE — ED Provider Notes (Addendum)
Medical screening examination/treatment/procedure(s) were conducted as a shared visit with non-physician practitioner(s) and myself.  I personally evaluated the patient during the encounter.  None 63 year old male presents with 7 days of cough and congestion now with fever up to 103.  Patient is tachycardic here.  Suspect that patient may have pneumonia and or COVID.  Patient placed on contact and droplet precautions.  Blood work and chest x-ray are pending as well as blood gas.  We will continue to monitor closely   10:38 PM Patient reassessed and alert and oriented x4.  Following commands.  Able to use a urinal at the bedside unassisted.  Chest x-ray consistent with pneumonia.  ABG results noted.  Patient started on coverage for community-acquired pneumonia.  He is able to protect his airway at this time and no indication for emergent the patient.  Will consult hospitalist for admission  CRITICAL CARE Performed by: Leota Jacobsen Total critical care time: 55 minutes Critical care time was exclusive of separately billable procedures and treating other patients. Critical care was necessary to treat or prevent imminent or life-threatening deterioration. Critical care was time spent personally by me on the following activities: development of treatment plan with patient and/or surrogate as well as nursing, discussions with consultants, evaluation of patient's response to treatment, examination of patient, obtaining history from patient or surrogate, ordering and performing treatments and interventions, ordering and review of laboratory studies, ordering and review of radiographic studies, pulse oximetry and re-evaluation of patient's condition.  Lacretia Leigh, MD 04/14/18 2130    Lacretia Leigh, MD 04/14/18 2239

## 2018-04-15 ENCOUNTER — Encounter (HOSPITAL_COMMUNITY): Payer: Self-pay

## 2018-04-15 DIAGNOSIS — I10 Essential (primary) hypertension: Secondary | ICD-10-CM

## 2018-04-15 DIAGNOSIS — E108 Type 1 diabetes mellitus with unspecified complications: Secondary | ICD-10-CM

## 2018-04-15 DIAGNOSIS — E785 Hyperlipidemia, unspecified: Secondary | ICD-10-CM

## 2018-04-15 LAB — GLUCOSE, CAPILLARY
Glucose-Capillary: 131 mg/dL — ABNORMAL HIGH (ref 70–99)
Glucose-Capillary: 147 mg/dL — ABNORMAL HIGH (ref 70–99)
Glucose-Capillary: 75 mg/dL (ref 70–99)
Glucose-Capillary: 121 mg/dL — ABNORMAL HIGH (ref 70–99)

## 2018-04-15 LAB — COMPREHENSIVE METABOLIC PANEL
ALBUMIN: 3.2 g/dL — AB (ref 3.5–5.0)
ALT: 15 U/L (ref 0–44)
AST: 22 U/L (ref 15–41)
Alkaline Phosphatase: 55 U/L (ref 38–126)
Anion gap: 9 (ref 5–15)
BUN: 22 mg/dL (ref 8–23)
CO2: 26 mmol/L (ref 22–32)
Calcium: 8 mg/dL — ABNORMAL LOW (ref 8.9–10.3)
Chloride: 103 mmol/L (ref 98–111)
Creatinine, Ser: 1.29 mg/dL — ABNORMAL HIGH (ref 0.61–1.24)
GFR calc Af Amer: >60 mL/min (ref >60)
GFR calc non Af Amer: 58 mL/min — ABNORMAL LOW (ref >60)
Glucose, Bld: 107 mg/dL — ABNORMAL HIGH (ref 70–99)
Potassium: 4.4 mmol/L (ref 3.5–5.1)
Sodium: 138 mmol/L (ref 135–145)
Total Bilirubin: 0.9 mg/dL (ref 0.3–1.2)
Total Protein: 6.1 g/dL — ABNORMAL LOW (ref 6.5–8.1)

## 2018-04-15 LAB — MRSA PCR SCREENING: MRSA by PCR: NEGATIVE

## 2018-04-15 LAB — RESPIRATORY PANEL BY PCR
Adenovirus: NOT DETECTED
Bordetella pertussis: NOT DETECTED
CORONAVIRUS 229E-RVPPCR: NOT DETECTED
Chlamydophila pneumoniae: NOT DETECTED
Coronavirus HKU1: NOT DETECTED
Coronavirus NL63: NOT DETECTED
Coronavirus OC43: NOT DETECTED
Influenza A: NOT DETECTED
Influenza B: NOT DETECTED
Metapneumovirus: NOT DETECTED
Mycoplasma pneumoniae: NOT DETECTED
Parainfluenza Virus 1: NOT DETECTED
Parainfluenza Virus 2: NOT DETECTED
Parainfluenza Virus 3: DETECTED — AB
Parainfluenza Virus 4: NOT DETECTED
Respiratory Syncytial Virus: NOT DETECTED
Rhinovirus / Enterovirus: NOT DETECTED

## 2018-04-15 LAB — CBC WITH DIFFERENTIAL/PLATELET
Abs Immature Granulocytes: 0.04 10*3/uL (ref 0.00–0.07)
BASOS PCT: 0 %
Basophils Absolute: 0 10*3/uL (ref 0.0–0.1)
Eosinophils Absolute: 0.2 10*3/uL (ref 0.0–0.5)
Eosinophils Relative: 2 %
HCT: 36.8 % — ABNORMAL LOW (ref 39.0–52.0)
Hemoglobin: 11.3 g/dL — ABNORMAL LOW (ref 13.0–17.0)
Immature Granulocytes: 1 %
Lymphocytes Relative: 15 %
Lymphs Abs: 1.3 10*3/uL (ref 0.7–4.0)
MCH: 28.8 pg (ref 26.0–34.0)
MCHC: 30.7 g/dL (ref 30.0–36.0)
MCV: 93.9 fL (ref 80.0–100.0)
MONOS PCT: 9 %
Monocytes Absolute: 0.8 10*3/uL (ref 0.1–1.0)
Neutro Abs: 6.5 10*3/uL (ref 1.7–7.7)
Neutrophils Relative %: 73 %
Platelets: 186 10*3/uL (ref 150–400)
RBC: 3.92 MIL/uL — ABNORMAL LOW (ref 4.22–5.81)
RDW: 14.2 % (ref 11.5–15.5)
WBC: 8.8 10*3/uL (ref 4.0–10.5)
nRBC: 0 % (ref 0.0–0.2)

## 2018-04-15 LAB — LACTIC ACID, PLASMA: Lactic Acid, Venous: 1 mmol/L (ref 0.5–1.9)

## 2018-04-15 LAB — STREP PNEUMONIAE URINARY ANTIGEN: Strep Pneumo Urinary Antigen: NEGATIVE

## 2018-04-15 LAB — HIV ANTIBODY (ROUTINE TESTING W REFLEX): HIV Screen 4th Generation wRfx: NONREACTIVE

## 2018-04-15 MED ORDER — METOPROLOL TARTRATE 50 MG PO TABS
50.0000 mg | ORAL_TABLET | Freq: Every day | ORAL | Status: DC
  Administered 2018-04-15 – 2018-04-19 (×5): 50 mg via ORAL
  Filled 2018-04-15: qty 2
  Filled 2018-04-15: qty 1
  Filled 2018-04-15 (×3): qty 2

## 2018-04-15 MED ORDER — NORTRIPTYLINE HCL 25 MG PO CAPS
25.0000 mg | ORAL_CAPSULE | Freq: Every day | ORAL | Status: DC
  Administered 2018-04-15 – 2018-04-18 (×4): 25 mg via ORAL
  Filled 2018-04-15 (×5): qty 1

## 2018-04-15 MED ORDER — GLIPIZIDE 5 MG PO TABS
5.0000 mg | ORAL_TABLET | Freq: Every day | ORAL | Status: DC
  Administered 2018-04-15 – 2018-04-17 (×3): 5 mg via ORAL
  Filled 2018-04-15 (×3): qty 1

## 2018-04-15 MED ORDER — INSULIN ASPART 100 UNIT/ML ~~LOC~~ SOLN
0.0000 [IU] | Freq: Three times a day (TID) | SUBCUTANEOUS | Status: DC
  Administered 2018-04-15 – 2018-04-18 (×4): 1 [IU] via SUBCUTANEOUS
  Administered 2018-04-19: 2 [IU] via SUBCUTANEOUS

## 2018-04-15 MED ORDER — HYDROXYZINE HCL 10 MG PO TABS
10.0000 mg | ORAL_TABLET | Freq: Three times a day (TID) | ORAL | Status: DC | PRN
  Administered 2018-04-15 – 2018-04-17 (×4): 10 mg via ORAL
  Filled 2018-04-15 (×6): qty 1

## 2018-04-15 MED ORDER — HYDROCHLOROTHIAZIDE 25 MG PO TABS
25.0000 mg | ORAL_TABLET | Freq: Every day | ORAL | Status: DC
  Administered 2018-04-15 – 2018-04-17 (×3): 25 mg via ORAL
  Filled 2018-04-15 (×3): qty 1

## 2018-04-15 MED ORDER — ONDANSETRON 4 MG PO TBDP
4.0000 mg | ORAL_TABLET | Freq: Every day | ORAL | Status: DC
  Administered 2018-04-15 – 2018-04-19 (×5): 4 mg via ORAL
  Filled 2018-04-15 (×5): qty 1

## 2018-04-15 MED ORDER — METHADONE HCL 5 MG PO TABS
5.0000 mg | ORAL_TABLET | Freq: Three times a day (TID) | ORAL | Status: DC
  Administered 2018-04-15 – 2018-04-19 (×13): 5 mg via ORAL
  Filled 2018-04-15 (×13): qty 1

## 2018-04-15 MED ORDER — GABAPENTIN 300 MG PO CAPS
600.0000 mg | ORAL_CAPSULE | Freq: Three times a day (TID) | ORAL | Status: DC
  Administered 2018-04-15 – 2018-04-19 (×13): 600 mg via ORAL
  Filled 2018-04-15 (×13): qty 2

## 2018-04-15 MED ORDER — INSULIN GLARGINE 100 UNIT/ML ~~LOC~~ SOLN
10.0000 [IU] | Freq: Every day | SUBCUTANEOUS | Status: DC
  Administered 2018-04-15 – 2018-04-17 (×3): 10 [IU] via SUBCUTANEOUS
  Filled 2018-04-15 (×3): qty 0.1

## 2018-04-15 MED ORDER — HYDROCODONE-ACETAMINOPHEN 5-325 MG PO TABS
1.0000 | ORAL_TABLET | Freq: Three times a day (TID) | ORAL | Status: DC | PRN
  Administered 2018-04-16 – 2018-04-19 (×3): 1 via ORAL
  Filled 2018-04-15 (×3): qty 1

## 2018-04-15 MED ORDER — INSULIN ASPART 100 UNIT/ML ~~LOC~~ SOLN: 0.0000 [IU] | Freq: Every day | SUBCUTANEOUS | Status: DC

## 2018-04-15 MED ORDER — DIVALPROEX SODIUM ER 250 MG PO TB24
250.0000 mg | ORAL_TABLET | Freq: Every day | ORAL | Status: DC
  Administered 2018-04-15 – 2018-04-19 (×5): 250 mg via ORAL
  Filled 2018-04-15 (×5): qty 1

## 2018-04-15 MED ORDER — ASPIRIN EC 81 MG PO TBEC
81.0000 mg | DELAYED_RELEASE_TABLET | Freq: Every day | ORAL | Status: DC
  Administered 2018-04-15 – 2018-04-19 (×5): 81 mg via ORAL
  Filled 2018-04-15 (×5): qty 1

## 2018-04-15 MED ORDER — ENOXAPARIN SODIUM 40 MG/0.4ML ~~LOC~~ SOLN
40.0000 mg | SUBCUTANEOUS | Status: DC
  Administered 2018-04-15 – 2018-04-19 (×5): 40 mg via SUBCUTANEOUS
  Filled 2018-04-15 (×5): qty 0.4

## 2018-04-15 MED ORDER — BASAGLAR KWIKPEN 100 UNIT/ML ~~LOC~~ SOPN: 100.0000 [IU] | PEN_INJECTOR | Freq: Every day | SUBCUTANEOUS | Status: DC

## 2018-04-15 MED ORDER — BENZONATATE 100 MG PO CAPS
100.0000 mg | ORAL_CAPSULE | ORAL | Status: DC | PRN
  Administered 2018-04-15 – 2018-04-17 (×3): 100 mg via ORAL
  Filled 2018-04-15 (×3): qty 1

## 2018-04-15 MED ORDER — SODIUM CHLORIDE 0.9 % IV SOLN
500.0000 mg | INTRAVENOUS | Status: DC
  Administered 2018-04-15: 500 mg via INTRAVENOUS
  Filled 2018-04-15 (×2): qty 500

## 2018-04-15 MED ORDER — SERTRALINE HCL 50 MG PO TABS
50.0000 mg | ORAL_TABLET | Freq: Every day | ORAL | Status: DC
  Administered 2018-04-15 – 2018-04-19 (×5): 50 mg via ORAL
  Filled 2018-04-15 (×5): qty 1

## 2018-04-15 MED ORDER — SODIUM CHLORIDE 0.9 % IV SOLN
INTRAVENOUS | Status: DC
  Administered 2018-04-15: 02:00:00 via INTRAVENOUS

## 2018-04-15 MED ORDER — METFORMIN HCL 500 MG PO TABS
500.0000 mg | ORAL_TABLET | Freq: Two times a day (BID) | ORAL | Status: DC
  Administered 2018-04-15 – 2018-04-17 (×5): 500 mg via ORAL
  Filled 2018-04-15 (×6): qty 1

## 2018-04-15 MED ORDER — HYDRALAZINE HCL 20 MG/ML IJ SOLN
5.0000 mg | INTRAMUSCULAR | Status: DC | PRN
  Administered 2018-04-16: 5 mg via INTRAVENOUS
  Filled 2018-04-15: qty 1

## 2018-04-15 MED ORDER — ALBUTEROL SULFATE HFA 108 (90 BASE) MCG/ACT IN AERS
2.0000 | INHALATION_SPRAY | RESPIRATORY_TRACT | Status: DC | PRN
  Administered 2018-04-15 – 2018-04-16 (×2): 2 via RESPIRATORY_TRACT

## 2018-04-15 MED ORDER — SIMVASTATIN 10 MG PO TABS
20.0000 mg | ORAL_TABLET | Freq: Every day | ORAL | Status: DC
  Administered 2018-04-15 – 2018-04-18 (×4): 20 mg via ORAL
  Filled 2018-04-15: qty 1
  Filled 2018-04-15: qty 2
  Filled 2018-04-15: qty 1
  Filled 2018-04-15 (×2): qty 2
  Filled 2018-04-15 (×2): qty 1
  Filled 2018-04-15: qty 2

## 2018-04-15 MED ORDER — AMLODIPINE BESYLATE 5 MG PO TABS
5.0000 mg | ORAL_TABLET | Freq: Every day | ORAL | Status: DC
  Administered 2018-04-15 – 2018-04-19 (×5): 5 mg via ORAL
  Filled 2018-04-15 (×5): qty 1

## 2018-04-15 MED ORDER — SODIUM CHLORIDE 0.9 % IV SOLN
1.0000 g | INTRAVENOUS | Status: DC
  Administered 2018-04-15 – 2018-04-18 (×4): 1 g via INTRAVENOUS
  Filled 2018-04-15 (×2): qty 1
  Filled 2018-04-15: qty 10
  Filled 2018-04-15 (×2): qty 1
  Filled 2018-04-15: qty 10

## 2018-04-15 MED ORDER — LOSARTAN POTASSIUM 50 MG PO TABS
100.0000 mg | ORAL_TABLET | Freq: Every day | ORAL | Status: DC
  Administered 2018-04-15 – 2018-04-19 (×5): 100 mg via ORAL
  Filled 2018-04-15 (×5): qty 2

## 2018-04-15 MED ORDER — METHOCARBAMOL 500 MG PO TABS: 500.0000 mg | ORAL_TABLET | Freq: Four times a day (QID) | ORAL | Status: DC | PRN

## 2018-04-15 MED ORDER — METOPROLOL TARTRATE 25 MG PO TABS
25.0000 mg | ORAL_TABLET | Freq: Every day | ORAL | Status: DC
  Administered 2018-04-15 – 2018-04-18 (×4): 25 mg via ORAL
  Filled 2018-04-15 (×4): qty 1

## 2018-04-15 NOTE — ED Notes (Signed)
ED TO INPATIENT HANDOFF REPORT  Name/Age/Gender [name] 63 y.o. male  Code Status Code Status History    Date Active Date Inactive Code Status Order ID Comments User Context   10/31/2017 1732 11/01/2017 1212 Full Code [id number]  Vicie Mutters, MD Inpatient      Home/SNF/Other Home  Chief Complaint sob  Level of Care/Admitting Diagnosis ED Disposition    ED Disposition Condition [place]: [facility] [[id number]]  Level of Care: Stepdown [14]  Admit to SDU based on following criteria: Hemodynamic compromise or significant risk of instability:  Patient requiring short term acute titration and management of vasoactive drips, and invasive monitoring (i.e., CVP and Arterial line).  Diagnosis: Acute respiratory failure with hypoxia and hypercapnia [facility]) [[id number]]  Admitting Physician: Elwyn Reach [[id number]]  Attending Physician: Elwyn Reach [[id number]]  Estimated length of stay: past midnight tomorrow  Certification:: I certify this patient will need inpatient services for at least 2 midnights  Bed request comments: high risk Covid 19  PT Class (Do Not Modify): Inpatient [101]  PT Acc Code (Do Not Modify): Private [1]       Medical History Past Medical History:  Diagnosis Date  . Anxiety   . Arthritis   . Chronic narcotic use   . Complication of anesthesia    h/o aspiration during back surgery  . Depression   . Diabetic peripheral neuropathy ([place])   . Hearing loss    wears bilateral hearing aids, bilat moderate SNHL  . Hyperlipidemia   . Hypertension   . Insomnia   . Tobacco abuse   . Type 1 diabetes mellitus ([place])   . Ulcer of left ankle ([place])   . Vision abnormalities    mild DM retinopathy    Allergies Allergies  Allergen Reactions  . Codeine Nausea And Vomiting  . Tetracyclines & Related Hives    IV Location/Drains/Wounds Patient Lines/Drains/Airways Status   Active Line/Drains/Airways    Name:   Placement  date:   Placement time:   Site:   Days:   Peripheral IV 04/14/18 Right Hand   04/14/18    2147    Hand   1   Peripheral IV 04/14/18 Left Antecubital   04/14/18    2147    Antecubital   1   Incision (Closed) 10/31/17 Ear Left   10/31/17    1017     166          Labs/Imaging Results for orders placed or performed during the hospital encounter of 04/14/18 (from the past 48 hour(s))  Lactic acid, plasma     Status: None   Collection Time: 04/14/18  9:14 PM  Result Value Ref Range   Lactic Acid, Venous 1.9 0.5 - 1.9 mmol/L    Comment: Performed at [facility], [place] [address], [place], [place] [address]  Comprehensive metabolic panel     Status: Abnormal   Collection Time: 04/14/18  9:14 PM  Result Value Ref Range   Sodium 139 135 - 145 mmol/L   Potassium 4.5 3.5 - 5.1 mmol/L   Chloride 101 98 - 111 mmol/L   CO2 28 22 - 32 mmol/L   Glucose, Bld 186 (H) 70 - 99 mg/dL   BUN 23 8 - 23 mg/dL   Creatinine, Ser 1.27 (H) 0.61 - 1.24 mg/dL   Calcium 8.8 (L) 8.9 - 10.3 mg/dL   Total Protein 7.6 6.5 - 8.1 g/dL   Albumin 4.2  3.5 - 5.0 g/dL   AST 17 15 - 41 U/L   ALT 15 0 - 44 U/L   Alkaline Phosphatase 72 38 - 126 U/L   Total Bilirubin 0.7 0.3 - 1.2 mg/dL   GFR calc non Af Amer 59 (L) >60 mL/min   GFR calc Af Amer >60 >60 mL/min   Anion gap 10 5 - 15    Comment: Performed at [facility], [place] [address], [place], [place] [address]  CBC WITH DIFFERENTIAL     Status: Abnormal   Collection Time: 04/14/18  9:14 PM  Result Value Ref Range   WBC 10.5 4.0 - 10.5 K/uL   RBC 4.61 4.22 - 5.81 MIL/uL   Hemoglobin 13.4 13.0 - 17.0 g/dL   HCT 41.7 39.0 - 52.0 %   MCV 90.5 80.0 - 100.0 fL   MCH 29.1 26.0 - 34.0 pg   MCHC 32.1 30.0 - 36.0 g/dL   RDW 14.1 11.5 - 15.5 %   Platelets 198 150 - 400 K/uL   nRBC 0.2 0.0 - 0.2 %   Neutrophils Relative % 86 %   Neutro Abs 9.1 (H) 1.7 - 7.7 K/uL   Lymphocytes Relative 7 %   Lymphs Abs 0.7 0.7 - 4.0 K/uL   Monocytes  Relative 6 %   Monocytes Absolute 0.6 0.1 - 1.0 K/uL   Eosinophils Relative 0 %   Eosinophils Absolute 0.0 0.0 - 0.5 K/uL   Basophils Relative 0 %   Basophils Absolute 0.0 0.0 - 0.1 K/uL   Immature Granulocytes 1 %   Abs Immature Granulocytes 0.07 0.00 - 0.07 K/uL    Comment: Performed at [facility], [place] [address], [place], [place] [address]  Urinalysis, Routine w reflex microscopic     Status: Abnormal   Collection Time: 04/14/18  9:14 PM  Result Value Ref Range   Color, Urine YELLOW YELLOW   APPearance CLEAR CLEAR   Specific Gravity, Urine 1.015 1.005 - 1.030   pH 6.0 5.0 - 8.0   Glucose, UA NEGATIVE NEGATIVE mg/dL   Hgb urine dipstick NEGATIVE NEGATIVE   Bilirubin Urine NEGATIVE NEGATIVE   Ketones, ur 5 (A) NEGATIVE mg/dL   Protein, ur >=300 (A) NEGATIVE mg/dL   Nitrite NEGATIVE NEGATIVE   Leukocytes,Ua NEGATIVE NEGATIVE   RBC / HPF 0-5 0 - 5 RBC/hpf   WBC, UA 0-5 0 - 5 WBC/hpf   Bacteria, UA NONE SEEN NONE SEEN   Squamous Epithelial / LPF 0-5 0 - 5   Mucus PRESENT    Hyaline Casts, UA PRESENT    Granular Casts, UA PRESENT     Comment: Performed at [facility], [place] [address], [place], [place] [address]  Valproic acid level     Status: Abnormal   Collection Time: 04/14/18  9:15 PM  Result Value Ref Range   Valproic Acid Lvl 27 (L) 50.0 - 100.0 ug/mL    Comment: Performed at [facility], [place] [address] [place],  [address]  Blood gas, arterial     Status: Abnormal   Collection Time: 04/14/18 10:00 PM  Result Value Ref Range   O2 Content 15.0 L/min   Delivery systems NON-REBREATHER OXYGEN MASK    pH, Arterial 7.345 (L) 7.350 - 7.450   pCO2 arterial 56.0 (H) 32.0 - 48.0 mmHg   pO2, Arterial 68.9 (L) 83.0 - 108.0 mmHg   Bicarbonate 28.8 (H) 20.0 - 28.0 mmol/L   Acid-Base Excess 3.3 (H) 0.0 -  2.0 mmol/L   O2 Saturation 88.0 %   Patient temperature 103.0    Collection site RIGHT RADIAL    Drawn by  [id number]    Sample type ARTERIAL    Allens test (pass/fail) PASS PASS    Comment: Performed at [facility], [place] [address] [place], [place] [address]   Dg Chest Port 1 View  Result Date: 04/14/2018 CLINICAL DATA:  Short of breath. EXAM: PORTABLE CHEST 1 VIEW COMPARISON:  None. FINDINGS: Normal heart size. No pleural effusion or edema. There are a few ill-defined patchy opacities within the right upper lobe and right lower lobe. Left lung appears clear. IMPRESSION: 1. Ill-defined, patchy opacities noted within the right upper lobe and right lower lobe. Electronically Signed   By: Kerby Moors M.D.   On: 04/14/2018 22:21    Pending Labs Unresulted Labs (From admission, onward)    Start     Ordered   04/14/18 2127  Novel Coronavirus, NAA (hospital order; send-out to ref lab)  (Novel Coronavirus, NAA [facility] Order; send-out to ref lab) with precautions panel)  Once,   R    Question Answer Comment  Current symptoms Fever and Shortness of breath   Excluded other viral illnesses Yes   Exposure Risk None   Patient immune status Normal      04/14/18 2127   04/14/18 2114  Lactic acid, plasma  Now then every 2 hours,   STAT     04/14/18 2114   04/14/18 2114  Blood Culture (routine x 2)  BLOOD CULTURE X 2,   STAT     04/14/18 2114   Signed and Held  Culture, blood (routine x 2) Call MD if unable to obtain prior to antibiotics being given  BLOOD CULTURE X 2,   R    Comments:  If blood cultures drawn in Emergency Department - Do not draw and cancel order    Signed and Held   Signed and Held  Culture, sputum-assessment  Once,   R     Signed and Held   Signed and Held  Gram stain  Once,   R     Signed and Held   Signed and Held  HIV antibody (Routine Screening)  Once,   R     Signed and Held   Signed and Held  Strep pneumoniae urinary antigen  Once,   R     Signed and Held   Signed and Held  Comprehensive metabolic panel  Tomorrow morning,   R     Signed and Held    Signed and Held  CBC  (enoxaparin (LOVENOX)    CrCl >/= 30 ml/min)  Once,   R    Comments:  Baseline for enoxaparin therapy IF NOT ALREADY DRAWN.  Notify MD if PLT < 100 K.    Signed and Held   Signed and Held  Creatinine, serum  (enoxaparin (LOVENOX)    CrCl >/= 30 ml/min)  Once,   R    Comments:  Baseline for enoxaparin therapy IF NOT ALREADY DRAWN.    Signed and Held   Signed and Held  Creatinine, serum  (enoxaparin (LOVENOX)    CrCl >/= 30 ml/min)  Weekly,   R    Comments:  while on enoxaparin therapy    Signed and Held   Signed and Held  Comprehensive metabolic panel  Tomorrow morning,   R     Signed and Held   Signed and Held  CBC WITH DIFFERENTIAL  Tomorrow morning,   R     Signed and Held   Signed and Held  Respiratory Panel by PCR  (Respiratory virus panel with precautions)  Once,   R     Signed and Held          Vitals/Pain Today's Vitals   04/14/18 2245 04/14/18 2300 04/14/18 2330 04/15/18 0000  BP:  (!) 139/104 (!) 147/73 (!) 139/59  Pulse: 88 90 83 76  Resp: (!) 22 14 17 15   Temp:      TempSrc:      SpO2: 92% 95% 90% 95%  Weight:      Height:        Isolation Precautions Airborne and Contact precautions  Medications Medications  0.9 %  sodium chloride infusion (1,000 mLs Intravenous New Bag/Given 04/14/18 2149)  albuterol (PROVENTIL HFA;VENTOLIN HFA) 108 (90 Base) MCG/ACT inhaler 2 puff (2 puffs Inhalation Given 04/14/18 2153)  magnesium sulfate IVPB 2 g 50 mL (0 g Intravenous Stopped 04/14/18 2257)  cefTRIAXone (ROCEPHIN) 1 g in sodium chloride 0.9 % 100 mL IVPB (0 g Intravenous Stopped 04/14/18 2312)  azithromycin (ZITHROMAX) 500 mg in sodium chloride 0.9 % 250 mL IVPB (0 mg Intravenous Stopped 04/14/18 2343)  acetaminophen (TYLENOL) tablet 1,000 mg (1,000 mg Oral Given 04/14/18 2245)    Mobility walks with person assist

## 2018-04-15 NOTE — Progress Notes (Signed)
PROGRESS NOTE    KENLEE MALER  TKZ:[id number] DOB: 12/26/1955 DOA: 04/14/2018 PCP: Leanna Battles, MD    Brief Narrative:  63 y.o. male with medical history significant of diabetes, hypertension, chronic pain syndrome, history of cochlear implant, anxiety disorder, hyperlipidemia, tobacco abuse, and chronic pain syndrome on methadone who was diagnosed with pneumonia apparently about a week ago.  He was initiated on antibiotics by his primary care physician.  He has been taking it his medication with no significant change.  Patient is worried and has gotten worse.  Breathing has been much worse.  He therefore came to the ER for further treatment.  He denied any fever or chill he has been having more worsening shortness of breath.  Today she patient came in hypoxic and hypercarbic.  He is therefore being admitted to the hospital with pneumonia.  Patient has significant right-sided pneumonia at this point.  He also has evidence of sepsis with fever chills.  Patient has been requiring up to 15 L of oxygen at the moment with nonrebreather back.  He has therefore been classified as a high risks for COVID-19 although no exposure to someone with known disease..  ED Course: Temperature is 102.7, blood pressure 178/64 pulse 94 respiratory of 28 oxygen sat 88% on room air currently 95% on 15 L nonrebreather back.  ABG showed a pH of 7.345 PCO2 of 56 and PO2 of 68.  Lehnert count is 10.5 hemoglobin 13.4 platelet 198.  Sodium 139 potassium 4.5 chloride 101 CO2 28 BUN 23 creatinine 1.27 and calcium 8.8.  Lactic acid is 1.9.  Chest x-ray showed right upper and lower lobe infiltrate.  Patient initiated on antibiotics and being admitted for treatment.  Assessment & Plan:   Principal Problem:   Acute respiratory failure with hypoxia and hypercapnia (HCC) Active Problems:   Healthcare-associated pneumonia   Dyslipidemia   Essential hypertension   Severe central sleep apnea comorbid with prescribed opioid use  Diabetes mellitus type 1 with manifestations ([place])   Sepsis ([place])   #1 acute respiratory failure with hypoxia and hypercarbia: -This is most likely due to pneumonia.   -Patient also chronic smoker but no significant wheezing.   -Initially required up to 15 L of oxygen.  - Suspect CAP vs aspiration pneumonia.  - Patient has no known contact but will be ruled out with COVID-19 testing given acute increase in O2 requirement and presenting sx - Continued with bronchodilator and broad abx  #2 diabetes:  - Insulin-dependent type 1.   - Continue home regimen with sliding scale insulin. - Glucose stable at present  #3 pneumonia:  - Community-acquired versus viral pneumonia.   - Continued on Rocephin and Zithromax.  #4 sleep apnea:  - Had been on cpap prior to admit  #5 hypertension:  - Continue with blood pressure medications per home regimen - Stable at present  #6 hyperlipidemia:  - Continue with home regimen. - Stable at present  #7 chronic pain syndrome:  - Patient is on methadone at home. - Continue regimen as tolerated  DVT prophylaxis: Lovenox subQ Code Status: Full Family Communication: Pt in room, family not at bedside Disposition Plan: Uncertain at this time  Consultants:     Procedures:     Antimicrobials: Anti-infectives (From admission, onward)   Start     Dose/Rate Route Frequency Ordered Stop   04/15/18 2200  cefTRIAXone (ROCEPHIN) 1 g in sodium chloride 0.9 % 100 mL IVPB     1 g 200 mL/hr over 30  Minutes Intravenous Every 24 hours 04/15/18 0117 04/22/18 2159   04/15/18 2200  azithromycin (ZITHROMAX) 500 mg in sodium chloride 0.9 % 250 mL IVPB     500 mg 250 mL/hr over 60 Minutes Intravenous Every 24 hours 04/15/18 0117 04/22/18 2159   04/14/18 2230  cefTRIAXone (ROCEPHIN) 1 g in sodium chloride 0.9 % 100 mL IVPB     1 g 200 mL/hr over 30 Minutes Intravenous  Once 04/14/18 2226 04/14/18 2312   04/14/18 2230  azithromycin (ZITHROMAX) 500 mg  in sodium chloride 0.9 % 250 mL IVPB     500 mg 250 mL/hr over 60 Minutes Intravenous  Once 04/14/18 2226 04/14/18 2343       Subjective: Feeling somewhat better, however still sob  Objective: Vitals:   04/15/18 1000 04/15/18 1027 04/15/18 1100 04/15/18 1134  BP: 112/76  (!) 153/70   Pulse: (!) 101 99 72 69  Resp: 18 15 10 12   Temp:      TempSrc:      SpO2: 97% 93% 97% 96%  Weight:      Height:        Intake/Output Summary (Last 24 hours) at 04/15/2018 1222 Last data filed at 04/15/2018 0700 Gross per 24 hour  Intake 414.65 ml  Output 350 ml  Net 64.65 ml   Filed Weights   04/14/18 2205  Weight: 81.6 kg    Examination:  General exam: Appears calm and comfortable  Respiratory system: increased resp effort, decreased BS Cardiovascular system: S1 & S2 heard, RRR Gastrointestinal system: Abdomen is nondistended, soft and nontender. No organomegaly or masses felt. Normal bowel sounds heard. Central nervous system: Alert and oriented. No focal neurological deficits. Extremities: Symmetric 5 x 5 power. Skin: No rashes, lesions Psychiatry: Judgement and insight appear normal. Mood & affect appropriate.   Data Reviewed: I have personally reviewed following labs and imaging studies  CBC: Recent Labs  Lab 04/14/18 2114 04/15/18 0404  WBC 10.5 8.8  NEUTROABS 9.1* 6.5  HGB 13.4 11.3*  HCT 41.7 36.8*  MCV 90.5 93.9  PLT 198 564   Basic Metabolic Panel: Recent Labs  Lab 04/14/18 2114 04/15/18 0404  NA 139 138  K 4.5 4.4  CL 101 103  CO2 28 26  GLUCOSE 186* 107*  BUN 23 22  CREATININE 1.27* 1.29*  CALCIUM 8.8* 8.0*   GFR: Estimated Creatinine Clearance: 59.7 mL/min (A) (by C-G formula based on SCr of 1.29 mg/dL (H)). Liver Function Tests: Recent Labs  Lab 04/14/18 2114 04/15/18 0404  AST 17 22  ALT 15 15  ALKPHOS 72 55  BILITOT 0.7 0.9  PROT 7.6 6.1*  ALBUMIN 4.2 3.2*   No results for input(s): LIPASE, AMYLASE in the last 168 hours. No results  for input(s): AMMONIA in the last 168 hours. Coagulation Profile: No results for input(s): INR, PROTIME in the last 168 hours. Cardiac Enzymes: No results for input(s): CKTOTAL, CKMB, CKMBINDEX, TROPONINI in the last 168 hours. BNP (last 3 results) No results for input(s): PROBNP in the last 8760 hours. HbA1C: No results for input(s): HGBA1C in the last 72 hours. CBG: Recent Labs  Lab 04/15/18 0145  GLUCAP 131*   Lipid Profile: No results for input(s): CHOL, HDL, LDLCALC, TRIG, CHOLHDL, LDLDIRECT in the last 72 hours. Thyroid Function Tests: No results for input(s): TSH, T4TOTAL, FREET4, T3FREE, THYROIDAB in the last 72 hours. Anemia Panel: No results for input(s): VITAMINB12, FOLATE, FERRITIN, TIBC, IRON, RETICCTPCT in the last 72 hours. Sepsis Labs: Recent Labs  Lab 04/14/18 2114 04/15/18 0216  LATICACIDVEN 1.9 1.0    Recent Results (from the past 240 hour(s))  Respiratory Panel by PCR     Status: Abnormal   Collection Time: 04/15/18  1:46 AM  Result Value Ref Range Status   Adenovirus NOT DETECTED NOT DETECTED Final   Coronavirus 229E NOT DETECTED NOT DETECTED Final    Comment: (NOTE) The Coronavirus on the Respiratory Panel, DOES NOT test for the novel  Coronavirus (2019 nCoV)    Coronavirus HKU1 NOT DETECTED NOT DETECTED Final   Coronavirus NL63 NOT DETECTED NOT DETECTED Final   Coronavirus OC43 NOT DETECTED NOT DETECTED Final   Metapneumovirus NOT DETECTED NOT DETECTED Final   Rhinovirus / Enterovirus NOT DETECTED NOT DETECTED Final   Influenza A NOT DETECTED NOT DETECTED Final   Influenza B NOT DETECTED NOT DETECTED Final   Parainfluenza Virus 1 NOT DETECTED NOT DETECTED Final   Parainfluenza Virus 2 NOT DETECTED NOT DETECTED Final   Parainfluenza Virus 3 DETECTED (A) NOT DETECTED Final   Parainfluenza Virus 4 NOT DETECTED NOT DETECTED Final   Respiratory Syncytial Virus NOT DETECTED NOT DETECTED Final   Bordetella pertussis NOT DETECTED NOT DETECTED Final    Chlamydophila pneumoniae NOT DETECTED NOT DETECTED Final   Mycoplasma pneumoniae NOT DETECTED NOT DETECTED Final    Comment: Performed at [facility], [address] [place], [place] [address]  MRSA PCR Screening     Status: None   Collection Time: 04/15/18  1:46 AM  Result Value Ref Range Status   MRSA by PCR NEGATIVE NEGATIVE Final    Comment:        The GeneXpert MRSA Assay (FDA approved for NASAL specimens only), is one component of a comprehensive MRSA colonization surveillance program. It is not intended to diagnose MRSA infection nor to guide or monitor treatment for MRSA infections. Performed at [facility], [place] [address] [place], [place] [address]      Radiology Studies: Dg Chest Port 1 View  Result Date: 04/14/2018 CLINICAL DATA:  Short of breath. EXAM: PORTABLE CHEST 1 VIEW COMPARISON:  None. FINDINGS: Normal heart size. No pleural effusion or edema. There are a few ill-defined patchy opacities within the right upper lobe and right lower lobe. Left lung appears clear. IMPRESSION: 1. Ill-defined, patchy opacities noted within the right upper lobe and right lower lobe. Electronically Signed   By: Kerby Moors M.D.   On: 04/14/2018 22:21    Scheduled Meds: . amLODipine  5 mg Oral Daily  . aspirin EC  81 mg Oral Daily  . divalproex  250 mg Oral Daily  . enoxaparin (LOVENOX) injection  40 mg Subcutaneous Q24H  . gabapentin  600 mg Oral TID  . glipiZIDE  5 mg Oral Daily  . hydrochlorothiazide  25 mg Oral Daily  . insulin aspart  0-5 Units Subcutaneous QHS  . insulin aspart  0-9 Units Subcutaneous TID WC  . insulin glargine  10 Units Subcutaneous Daily  . losartan  100 mg Oral Daily  . metFORMIN  500 mg Oral BID WC  . methadone  5 mg Oral TID  . metoprolol tartrate  25 mg Oral QHS  . metoprolol tartrate  50 mg Oral Daily  . nortriptyline  25 mg Oral QHS  . ondansetron  4 mg Oral Daily  . sertraline  50 mg Oral Daily  . simvastatin  20  mg Oral QHS   Continuous Infusions: . azithromycin    . cefTRIAXone (  [place])  IV       LOS: 1 day   Marylu Lund, MD [facility] Pager On Amion  If 7PM-7AM, please contact night-coverage 04/15/2018, 12:22 PM

## 2018-04-16 LAB — COMPREHENSIVE METABOLIC PANEL
ALT: 14 U/L (ref 0–44)
AST: 16 U/L (ref 15–41)
Albumin: 3.1 g/dL — ABNORMAL LOW (ref 3.5–5.0)
Alkaline Phosphatase: 51 U/L (ref 38–126)
Anion gap: 7 (ref 5–15)
BUN: 27 mg/dL — ABNORMAL HIGH (ref 8–23)
CO2: 30 mmol/L (ref 22–32)
Calcium: 8.1 mg/dL — ABNORMAL LOW (ref 8.9–10.3)
Chloride: 101 mmol/L (ref 98–111)
Creatinine, Ser: 1.32 mg/dL — ABNORMAL HIGH (ref 0.61–1.24)
GFR calc Af Amer: >60 mL/min (ref >60)
GFR calc non Af Amer: 57 mL/min — ABNORMAL LOW (ref >60)
GLUCOSE: 138 mg/dL — AB (ref 70–99)
Potassium: 3.8 mmol/L (ref 3.5–5.1)
SODIUM: 138 mmol/L (ref 135–145)
Total Bilirubin: 0.3 mg/dL (ref 0.3–1.2)
Total Protein: 6 g/dL — ABNORMAL LOW (ref 6.5–8.1)

## 2018-04-16 LAB — CBC
HCT: 37.7 % — ABNORMAL LOW (ref 39.0–52.0)
HEMOGLOBIN: 11.3 g/dL — AB (ref 13.0–17.0)
MCH: 28.9 pg (ref 26.0–34.0)
MCHC: 30 g/dL (ref 30.0–36.0)
MCV: 96.4 fL (ref 80.0–100.0)
Platelets: 155 10*3/uL (ref 150–400)
RBC: 3.91 MIL/uL — ABNORMAL LOW (ref 4.22–5.81)
RDW: 14.5 % (ref 11.5–15.5)
WBC: 5.5 10*3/uL (ref 4.0–10.5)
nRBC: 0 % (ref 0.0–0.2)

## 2018-04-16 LAB — EXPECTORATED SPUTUM ASSESSMENT W GRAM STAIN, RFLX TO RESP C: Special Requests: NORMAL

## 2018-04-16 LAB — GLUCOSE, CAPILLARY
GLUCOSE-CAPILLARY: 132 mg/dL — AB (ref 70–99)
Glucose-Capillary: 105 mg/dL — ABNORMAL HIGH (ref 70–99)
Glucose-Capillary: 115 mg/dL — ABNORMAL HIGH (ref 70–99)
Glucose-Capillary: 119 mg/dL — ABNORMAL HIGH (ref 70–99)

## 2018-04-16 LAB — EXPECTORATED SPUTUM ASSESSMENT W REFEX TO RESP CULTURE

## 2018-04-16 MED ORDER — AZITHROMYCIN 250 MG PO TABS
500.0000 mg | ORAL_TABLET | Freq: Every day | ORAL | Status: DC
  Administered 2018-04-16 – 2018-04-18 (×3): 500 mg via ORAL
  Filled 2018-04-16 (×3): qty 2

## 2018-04-16 NOTE — Progress Notes (Addendum)
PROGRESS NOTE    [name]  OIN:[id number] DOB: [birth year] DOA: 04/14/2018 PCP: Leanna Battles, MD    Brief Narrative:  63 y.o. male with medical history significant of diabetes, hypertension, chronic pain syndrome, history of cochlear implant, anxiety disorder, hyperlipidemia, tobacco abuse, and chronic pain syndrome on methadone who was diagnosed with pneumonia apparently about a week ago.  He was initiated on antibiotics by his primary care physician.  He has been taking it his medication with no significant change.  Patient is worried and has gotten worse.  Breathing has been much worse.  He therefore came to the ER for further treatment.  He denied any fever or chill he has been having more worsening shortness of breath.  Today she patient came in hypoxic and hypercarbic.  He is therefore being admitted to the hospital with pneumonia.  Patient has significant right-sided pneumonia at this point.  He also has evidence of sepsis with fever chills.  Patient has been requiring up to 15 L of oxygen at the moment with nonrebreather back.  He has therefore been classified as a high risks for COVID-19 although no exposure to someone with known disease..  ED Course: Temperature is 102.7, blood pressure 178/64 pulse 94 respiratory of 28 oxygen sat 88% on room air currently 95% on 15 L nonrebreather back.  ABG showed a pH of 7.345 PCO2 of 56 and PO2 of 68.  [place] count is 10.5 hemoglobin 13.4 platelet 198.  Sodium 139 potassium 4.5 chloride 101 CO2 28 BUN 23 creatinine 1.27 and calcium 8.8.  Lactic acid is 1.9.  Chest x-ray showed right upper and lower lobe infiltrate.  Patient initiated on antibiotics and being admitted for treatment.  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   -Patient also chronic smoker but no significant wheezing.   -Initially required up to 15 L of oxygen O2 weaned to 6L this AM - Suspect CAP vs aspiration pneumonia.  - Patient has no known contact but will be ruled out with COVID-19 testing given acute increase in O2 requirement and presenting sx - Continued with bronchodilator and broad abx. Afebrile this AM. No leukocytosis  #2 diabetes:  - Insulin-dependent type 1.   - Continue home regimen with sliding scale insulin. - Glucose trends reviewed. Stable  #3 pneumonia with sepsis present on admission:  - Community-acquired versus viral pneumonia.   - Continued on Rocephin and Zithromax. - Afebrile this AM  #4 sleep apnea:  - Had been on cpap prior to admit - Avoid bipap/cpap due to concerns of aerosolizing virus should COVID be pos   #5 hypertension:  - Continue with blood pressure medications per home regimen - Overall had been stable, noted to be elevated this AM  #6 hyperlipidemia:  - Continue with home regimen. - Currently stable  #7 chronic pain syndrome:  - Patient is on methadone at home. - Continue with analgesic as tolerate  #8 Stage 3 CKD    DVT prophylaxis: Lovenox subQ Code Status: Full Family Communication: Pt in room, family not at bedside Disposition Plan: Uncertain at this time  Consultants:     Procedures:  Antimicrobials: Anti-infectives (From admission, onward)   Start     Dose/Rate Route Frequency Ordered Stop   04/16/18 2200  azithromycin (ZITHROMAX) tablet 500 mg     500 mg Oral Daily at bedtime 04/16/18 0840 04/21/18 2159   04/15/18 2200  cefTRIAXone (ROCEPHIN) 1 g in sodium chloride 0.9 % 100 mL IVPB     1 g 200 mL/hr over 30 Minutes Intravenous Every 24 hours 04/15/18 0117 04/22/18 2159   04/15/18 2200   azithromycin (ZITHROMAX) 500 mg in sodium chloride 0.9 % 250 mL IVPB  Status:  Discontinued     500 mg 250 mL/hr over 60 Minutes Intravenous Every 24 hours 04/15/18 0117 04/16/18 0840   04/14/18 2230  cefTRIAXone (ROCEPHIN) 1 g in sodium chloride 0.9 % 100 mL IVPB     1 g 200 mL/hr over 30 Minutes Intravenous  Once 04/14/18 2226 04/14/18 2312   04/14/18 2230  azithromycin (ZITHROMAX) 500 mg in sodium chloride 0.9 % 250 mL IVPB     500 mg 250 mL/hr over 60 Minutes Intravenous  Once 04/14/18 2226 04/14/18 2343      Subjective: Without overnight events  Objective: Vitals:   04/16/18 0700 04/16/18 0800 04/16/18 0900 04/16/18 1000  BP:  113/88  (!) 196/52  Pulse: 78 74 78 81  Resp: 14 19 18 15   Temp:      TempSrc:      SpO2: (!) 82% 92% 97% (!) 88%  Weight:      Height:        Intake/Output Summary (Last 24 hours) at 04/16/2018 1104 Last data filed at 04/16/2018 0600 Gross per 24 hour  Intake 350 ml  Output 650 ml  Net -300 ml   Filed Weights   04/14/18 2205  Weight: 81.6 kg    Examination: General exam:Laying in bed, in nad Respiratory system: Normal respiratory effort, no audible wheezing Cardiovascular system: regular rate, s1, s2 Gastrointestinal system: Soft, nondistended Central nervous system: CN2-12 grossly intact, strength intact Extremities: Perfused, no clubbing Skin: Normal skin turgor, no notable skin lesions seen Psychiatry: Mood appears normal // no visual hallucinations   Data Reviewed: I have personally reviewed following labs and imaging studies  CBC: Recent Labs  Lab 04/14/18 2114 04/15/18 0404 04/16/18 0610  WBC 10.5 8.8 5.5  NEUTROABS 9.1* 6.5  --   HGB 13.4 11.3* 11.3*  HCT 41.7 36.8* 37.7*  MCV 90.5 93.9 96.4  PLT 198 186 030   Basic Metabolic Panel: Recent Labs  Lab 04/14/18 2114 04/15/18 0404 04/16/18 0610  NA 139 138 138  K 4.5 4.4 3.8  CL 101 103 101  CO2 28 26 30   GLUCOSE 186* 107* 138*  BUN 23 22 27*  CREATININE 1.27*  1.29* 1.32*  CALCIUM 8.8* 8.0* 8.1*   GFR: Estimated Creatinine Clearance: 58.4 mL/min (A) (by C-G formula based on SCr of 1.32 mg/dL (H)). Liver Function Tests: Recent Labs  Lab 04/14/18 2114 04/15/18 0404 04/16/18 0610  AST 17 22 16   ALT 15 15 14   ALKPHOS 72 55 51  BILITOT 0.7 0.9 0.3  PROT 7.6 6.1* 6.0*  ALBUMIN 4.2 3.2* 3.1*   No results for input(s): LIPASE, AMYLASE in the last 168 hours. No results for input(s): AMMONIA in the last 168 hours. Coagulation Profile: No results for input(s): INR, PROTIME in the last 168 hours. Cardiac Enzymes: No results for input(s): CKTOTAL, CKMB, CKMBINDEX, TROPONINI in the last 168 hours. BNP (last 3 results) No results for input(s): PROBNP in the  last 8760 hours. HbA1C: No results for input(s): HGBA1C in the last 72 hours. CBG: Recent Labs  Lab 04/15/18 0145 04/15/18 0754 04/15/18 1327 04/15/18 1612 04/15/18 2111  GLUCAP 131* 147* 75 121* 105*   Lipid Profile: No results for input(s): CHOL, HDL, LDLCALC, TRIG, CHOLHDL, LDLDIRECT in the last 72 hours. Thyroid Function Tests: No results for input(s): TSH, T4TOTAL, FREET4, T3FREE, THYROIDAB in the last 72 hours. Anemia Panel: No results for input(s): VITAMINB12, FOLATE, FERRITIN, TIBC, IRON, RETICCTPCT in the last 72 hours. Sepsis Labs: Recent Labs  Lab 04/14/18 2114 04/15/18 0216  LATICACIDVEN 1.9 1.0    Recent Results (from the past 240 hour(s))  Blood Culture (routine x 2)     Status: None (Preliminary result)   Collection Time: 04/14/18  9:14 PM  Result Value Ref Range Status   Specimen Description   Final    BLOOD LEFT ANTECUBITAL Performed at [facility], [place] [address] [place], [place] [address]    Special Requests   Final    BOTTLES DRAWN AEROBIC AND ANAEROBIC Blood Culture results may not be optimal due to an inadequate volume of blood received in culture bottles Performed at [facility] [address],  [place], [place] [address]    Culture   Final    NO GROWTH 1 DAY Performed at [facility], [place] [address], [place], [place] [address]    Report Status PENDING  Incomplete  Blood Culture (routine x 2)     Status: None (Preliminary result)   Collection Time: 04/14/18  9:19 PM  Result Value Ref Range Status   Specimen Description   Final    BLOOD BLOOD RIGHT HAND Performed at [facility] [address] [place], [place] [address]    Special Requests   Final    BOTTLES DRAWN AEROBIC AND ANAEROBIC Blood Culture adequate volume Performed at [facility] [address], [place], [place] [address]    Culture   Final    NO GROWTH 1 DAY Performed at [facility], [place] [address] [place], [place] [address]    Report Status PENDING  Incomplete  Respiratory Panel by PCR     Status: Abnormal   Collection Time: 04/15/18  1:46 AM  Result Value Ref Range Status   Adenovirus NOT DETECTED NOT DETECTED Final   Coronavirus 229E NOT DETECTED NOT DETECTED Final    Comment: (NOTE) The Coronavirus on the Respiratory Panel, DOES NOT test for the novel  Coronavirus (2019 nCoV)    Coronavirus HKU1 NOT DETECTED NOT DETECTED Final   Coronavirus NL63 NOT DETECTED NOT DETECTED Final   Coronavirus OC43 NOT DETECTED NOT DETECTED Final   Metapneumovirus NOT DETECTED NOT DETECTED Final   Rhinovirus / Enterovirus NOT DETECTED NOT DETECTED Final   Influenza A NOT DETECTED NOT DETECTED Final   Influenza B NOT DETECTED NOT DETECTED Final   Parainfluenza Virus 1 NOT DETECTED NOT DETECTED Final   Parainfluenza Virus 2 NOT DETECTED NOT DETECTED Final   Parainfluenza Virus 3 DETECTED (A) NOT DETECTED Final   Parainfluenza Virus 4 NOT DETECTED NOT DETECTED Final   Respiratory Syncytial Virus NOT DETECTED NOT DETECTED Final   Bordetella pertussis NOT DETECTED NOT DETECTED Final   Chlamydophila pneumoniae NOT DETECTED NOT DETECTED Final   Mycoplasma pneumoniae NOT  DETECTED NOT DETECTED Final    Comment: Performed at [facility], [address], [place],  [address]  MRSA PCR Screening  Status: None   Collection Time: 04/15/18  1:46 AM  Result Value Ref Range Status   MRSA by PCR NEGATIVE NEGATIVE Final    Comment:        The GeneXpert MRSA Assay (FDA approved for NASAL specimens only), is one component of a comprehensive MRSA colonization surveillance program. It is not intended to diagnose MRSA infection nor to guide or monitor treatment for MRSA infections. Performed at [facility], [place] [address], [place], [place] [address]      Radiology Studies: Dg Chest Port 1 View  Result Date: 04/14/2018 CLINICAL DATA:  Short of breath. EXAM: PORTABLE CHEST 1 VIEW COMPARISON:  None. FINDINGS: Normal heart size. No pleural effusion or edema. There are a few ill-defined patchy opacities within the right upper lobe and right lower lobe. Left lung appears clear. IMPRESSION: 1. Ill-defined, patchy opacities noted within the right upper lobe and right lower lobe. Electronically Signed   By: Kerby Moors M.D.   On: 04/14/2018 22:21    Scheduled Meds: . amLODipine  5 mg Oral Daily  . aspirin EC  81 mg Oral Daily  . azithromycin  500 mg Oral QHS  . divalproex  250 mg Oral Daily  . enoxaparin (LOVENOX) injection  40 mg Subcutaneous Q24H  . gabapentin  600 mg Oral TID  . glipiZIDE  5 mg Oral Daily  . hydrochlorothiazide  25 mg Oral Daily  . insulin aspart  0-5 Units Subcutaneous QHS  . insulin aspart  0-9 Units Subcutaneous TID WC  . insulin glargine  10 Units Subcutaneous Daily  . losartan  100 mg Oral Daily  . metFORMIN  500 mg Oral BID WC  . methadone  5 mg Oral TID  . metoprolol tartrate  25 mg Oral QHS  . metoprolol tartrate  50 mg Oral Daily  . nortriptyline  25 mg Oral QHS  . ondansetron  4 mg Oral Daily  . sertraline  50 mg Oral Daily  . simvastatin  20 mg Oral QHS   Continuous Infusions: . cefTRIAXone  (ROCEPHIN)  IV Stopped (04/15/18 2145)     LOS: 2 days   Marylu Lund, MD [facility] Pager On Amion  If 7PM-7AM, please contact night-coverage 04/16/2018, 11:04 AM

## 2018-04-16 NOTE — Progress Notes (Signed)
PHARMACIST - PHYSICIAN COMMUNICATION CONCERNING: Antibiotic IV to Oral Route Change Policy  RECOMMENDATION: This patient is receiving azithromycin by the intravenous route.  Based on criteria approved by the Pharmacy and Therapeutics Committee, the antibiotic(s) is/are being converted to the equivalent oral dose form(s).   DESCRIPTION: These criteria include:  Patient being treated for a respiratory tract infection, urinary tract infection, cellulitis or clostridium difficile associated diarrhea if on metronidazole  The patient is not neutropenic and does not exhibit a GI malabsorption state  The patient is eating (either orally or via tube) and/or has been taking other orally administered medications for a least 24 hours  The patient is improving clinically and has a Tmax < 100.5  If you have questions about this conversion, please contact the Pharmacy Department  []   [phone] )  [facility] []   [phone] )  [facility] []   [phone] )  [facility] []   [phone] )  [facility] [x]   [phone] )  [place], [place].D 04/16/2018 8:42 AM

## 2018-04-16 NOTE — Progress Notes (Signed)
Pt has slept quietly most of the day.  resp even and unlabored.  desats with movement.  Remains on high flow at 5L per [place].

## 2018-04-17 LAB — COMPREHENSIVE METABOLIC PANEL
ALT: 14 U/L (ref 0–44)
AST: 13 U/L — ABNORMAL LOW (ref 15–41)
Albumin: 3.2 g/dL — ABNORMAL LOW (ref 3.5–5.0)
Alkaline Phosphatase: 51 U/L (ref 38–126)
Anion gap: 7 (ref 5–15)
BUN: 27 mg/dL — ABNORMAL HIGH (ref 8–23)
CO2: 29 mmol/L (ref 22–32)
Calcium: 8.4 mg/dL — ABNORMAL LOW (ref 8.9–10.3)
Chloride: 101 mmol/L (ref 98–111)
Creatinine, Ser: 1.25 mg/dL — ABNORMAL HIGH (ref 0.61–1.24)
GFR calc Af Amer: >60 mL/min (ref >60)
GFR calc non Af Amer: >60 mL/min (ref >60)
Glucose, Bld: 90 mg/dL (ref 70–99)
Potassium: 4 mmol/L (ref 3.5–5.1)
Sodium: 137 mmol/L (ref 135–145)
TOTAL PROTEIN: 6.3 g/dL — AB (ref 6.5–8.1)
Total Bilirubin: 0.6 mg/dL (ref 0.3–1.2)

## 2018-04-17 LAB — CBC
HCT: 35.7 % — ABNORMAL LOW (ref 39.0–52.0)
Hemoglobin: 11.2 g/dL — ABNORMAL LOW (ref 13.0–17.0)
MCH: 29.5 pg (ref 26.0–34.0)
MCHC: 31.4 g/dL (ref 30.0–36.0)
MCV: 93.9 fL (ref 80.0–100.0)
PLATELETS: 166 10*3/uL (ref 150–400)
RBC: 3.8 MIL/uL — ABNORMAL LOW (ref 4.22–5.81)
RDW: 14.1 % (ref 11.5–15.5)
WBC: 5.1 10*3/uL (ref 4.0–10.5)
nRBC: 0 % (ref 0.0–0.2)

## 2018-04-17 LAB — NOVEL CORONAVIRUS, NAA (HOSP ORDER, SEND-OUT TO REF LAB; TAT 18-24 HRS): SARS-CoV-2, NAA: NOT DETECTED

## 2018-04-17 LAB — GLUCOSE, CAPILLARY
Glucose-Capillary: 88 mg/dL (ref 70–99)
Glucose-Capillary: 94 mg/dL (ref 70–99)
Glucose-Capillary: 80 mg/dL (ref 70–99)
Glucose-Capillary: 145 mg/dL — ABNORMAL HIGH (ref 70–99)

## 2018-04-17 MED ORDER — CHLORHEXIDINE GLUCONATE CLOTH 2 % EX PADS
6.0000 | MEDICATED_PAD | Freq: Every day | CUTANEOUS | Status: DC
  Administered 2018-04-17: 6 via TOPICAL

## 2018-04-17 MED ORDER — HYDRALAZINE HCL 20 MG/ML IJ SOLN
10.0000 mg | Freq: Once | INTRAMUSCULAR | Status: AC
  Administered 2018-04-17: 10 mg via INTRAVENOUS
  Filled 2018-04-17: qty 1

## 2018-04-17 MED ORDER — INSULIN GLARGINE 100 UNIT/ML ~~LOC~~ SOLN
8.0000 [IU] | Freq: Every day | SUBCUTANEOUS | Status: DC
  Administered 2018-04-18 – 2018-04-19 (×2): 8 [IU] via SUBCUTANEOUS
  Filled 2018-04-17 (×2): qty 0.08

## 2018-04-17 MED ORDER — ORAL CARE MOUTH RINSE
15.0000 mL | Freq: Two times a day (BID) | OROMUCOSAL | Status: DC
  Administered 2018-04-17 – 2018-04-18 (×3): 15 mL via OROMUCOSAL

## 2018-04-17 NOTE — Progress Notes (Addendum)
Out the window  PROGRESS NOTE    [name]  MLJ:[id number] DOB: 24-Aug-1955 DOA: 04/14/2018 PCP: Leanna Battles, MD  Brief Narrative:  63 y.o. male with medical history significant of DM 2, hypertension, chronic pain syndrome, history of cochlear implant, anxiety disorder, hyperlipidemia, tobacco abuse, and chronic pain syndrome on methadone who was diagnosed with pneumonia apparently about a week ago.  He was initiated on antibiotics by his primary care physician.  despite this continued to get clinically worse, presented to the emergency room with worsening shortness of breath and fever of 102. -Initially required 15 L O2, nonrebreather mask, admitted as high risk, but rule out. -Patient denies any exposure to a positive suspected case or travel history -is febrile to 102 in the emergency room, hypoxic on a nonrebreather mask, ABG B PO2 of 68, chest x-ray noted right upper and lower lobe infiltrates. -He was started on broad-spectrum antibiotics for pneumonia, despite high risk of Covid 19  Assessment & Plan:  #1. Acute respiratory failure with hypoxia  -most likely has Covid 19 -currently oxygen requirement is improving, now on 5 L nasal cannula down from NRM/15LHFNC, admitted with fever pf 102, infiltrates on CXR -on day 3 of ceftriaxone/azithromycin -Covid 19 PCR is pending, parainfluenza was positive but could be co-infection -Wean O2 as tolerated -Advised patient to ambulate in room -DC home if able to wean O2 down to 2L and overall continues to improve clinically  #2Type 2 diabetes mellitus -CBGs controlled, continue Lantus with a lower dose, discontinue glipizide and metformin  #3 Sepsis due to pneumonia -improving, see above  #4 sleep apnea:  - Had been on cpap prior to admit - Avoid bipap/cpap due to concerns of aerosolizing virus should COVID be positive   #5 hypertension:  - stable, hold HCTZ  #6 hyperlipidemia:  - Currently stable  #7 chronic pain  syndrome:  - Patient is on methadone at home. - Continue same  #8 Stage 3 CKD -stable   DVT prophylaxis: Lovenox subQ Code Status: Full Family Communication: Pt in room, family not at bedside Disposition Plan: home likely in 48hours when O2 requirement improves  Consultants:     Procedures:     Antimicrobials: Anti-infectives (From admission, onward)   Start     Dose/Rate Route Frequency Ordered Stop   04/16/18 2200  azithromycin (ZITHROMAX) tablet 500 mg     500 mg Oral Daily at bedtime 04/16/18 0840 04/21/18 2159   04/15/18 2200  cefTRIAXone (ROCEPHIN) 1 g in sodium chloride 0.9 % 100 mL IVPB     1 g 200 mL/hr over 30 Minutes Intravenous Every 24 hours 04/15/18 0117 04/22/18 2159   04/15/18 2200  azithromycin (ZITHROMAX) 500 mg in sodium chloride 0.9 % 250 mL IVPB  Status:  Discontinued     500 mg 250 mL/hr over 60 Minutes Intravenous Every 24 hours 04/15/18 0117 04/16/18 0840   04/14/18 2230  cefTRIAXone (ROCEPHIN) 1 g in sodium chloride 0.9 % 100 mL IVPB     1 g 200 mL/hr over 30 Minutes Intravenous  Once 04/14/18 2226 04/14/18 2312   04/14/18 2230  azithromycin (ZITHROMAX) 500 mg in sodium chloride 0.9 % 250 mL IVPB     500 mg 250 mL/hr over 60 Minutes Intravenous  Once 04/14/18 2226 04/14/18 2343      Subjective: -improving, down to 5L O2 now Objective: Vitals:   04/17/18 0200 04/17/18 0400 04/17/18 0600 04/17/18 0800  BP: 124/76 (!) 144/46 (!) 164/58 (!) 152/45  Pulse: 69  61 71 71  Resp: (!) 7 12 20 12   Temp:   98.3 F (36.8 C)   TempSrc:   Axillary   SpO2: 95% 98% 95% 96%  Weight:      Height:        Intake/Output Summary (Last 24 hours) at 04/17/2018 1057 Last data filed at 04/17/2018 0800 Gross per 24 hour  Intake 700 ml  Output 2200 ml  Net -1500 ml   Filed Weights   04/14/18 2205  Weight: 81.6 kg    Examination: Gen: frail, chronically ill-appearing, lying in bed, no distress HEENT: PERRLA, Neck supple, no JVD Lungs: good air movement,  bilateral bronchial breath sounds CVS: RRR,No Gallops,Rubs or new Murmurs Abd: soft, Non tender, non distended, BS present Extremities: no edema Skin: no new rashes  Data Reviewed: I have personally reviewed following labs and imaging studies  CBC: Recent Labs  Lab 04/14/18 2114 04/15/18 0404 04/16/18 0610 04/17/18 0500  WBC 10.5 8.8 5.5 5.1  NEUTROABS 9.1* 6.5  --   --   HGB 13.4 11.3* 11.3* 11.2*  HCT 41.7 36.8* 37.7* 35.7*  MCV 90.5 93.9 96.4 93.9  PLT 198 186 155 785   Basic Metabolic Panel: Recent Labs  Lab 04/14/18 2114 04/15/18 0404 04/16/18 0610 04/17/18 0500  NA 139 138 138 137  K 4.5 4.4 3.8 4.0  CL 101 103 101 101  CO2 28 26 30 29   GLUCOSE 186* 107* 138* 90  BUN 23 22 27* 27*  CREATININE 1.27* 1.29* 1.32* 1.25*  CALCIUM 8.8* 8.0* 8.1* 8.4*   GFR: Estimated Creatinine Clearance: 61.6 mL/min (A) (by C-G formula based on SCr of 1.25 mg/dL (H)). Liver Function Tests: Recent Labs  Lab 04/14/18 2114 04/15/18 0404 04/16/18 0610 04/17/18 0500  AST 17 22 16  13*  ALT 15 15 14 14   ALKPHOS 72 55 51 51  BILITOT 0.7 0.9 0.3 0.6  PROT 7.6 6.1* 6.0* 6.3*  ALBUMIN 4.2 3.2* 3.1* 3.2*   No results for input(s): LIPASE, AMYLASE in the last 168 hours. No results for input(s): AMMONIA in the last 168 hours. Coagulation Profile: No results for input(s): INR, PROTIME in the last 168 hours. Cardiac Enzymes: No results for input(s): CKTOTAL, CKMB, CKMBINDEX, TROPONINI in the last 168 hours. BNP (last 3 results) No results for input(s): PROBNP in the last 8760 hours. HbA1C: No results for input(s): HGBA1C in the last 72 hours. CBG: Recent Labs  Lab 04/15/18 2111 04/16/18 1259 04/16/18 1748 04/16/18 2027 04/17/18 0928  GLUCAP 105* 115* 119* 132* 88   Lipid Profile: No results for input(s): CHOL, HDL, LDLCALC, TRIG, CHOLHDL, LDLDIRECT in the last 72 hours. Thyroid Function Tests: No results for input(s): TSH, T4TOTAL, FREET4, T3FREE, THYROIDAB in the last 72  hours. Anemia Panel: No results for input(s): VITAMINB12, FOLATE, FERRITIN, TIBC, IRON, RETICCTPCT in the last 72 hours. Sepsis Labs: Recent Labs  Lab 04/14/18 2114 04/15/18 0216  LATICACIDVEN 1.9 1.0    Recent Results (from the past 240 hour(s))  Blood Culture (routine x 2)     Status: None (Preliminary result)   Collection Time: 04/14/18  9:14 PM  Result Value Ref Range Status   Specimen Description   Final    BLOOD LEFT ANTECUBITAL Performed at [facility], [place] [address], [place],  [place] [address]    Special Requests   Final    BOTTLES DRAWN AEROBIC AND ANAEROBIC Blood Culture results may not be optimal due to an inadequate volume of blood received in  culture bottles Performed at [facility] [address], [place], [place] [address]    Culture   Final    NO GROWTH 2 DAYS Performed at [facility] [address] [place], [place] [address]    Report Status PENDING  Incomplete  Blood Culture (routine x 2)     Status: None (Preliminary result)   Collection Time: 04/14/18  9:19 PM  Result Value Ref Range Status   Specimen Description   Final    BLOOD BLOOD RIGHT HAND Performed at [facility] [address], [place], [place] [address]    Special Requests   Final    BOTTLES DRAWN AEROBIC AND ANAEROBIC Blood Culture adequate volume Performed at [facility] [address] [place], [place] [address]    Culture   Final    NO GROWTH 2 DAYS Performed at [facility] [address] [place], [place] [address]    Report Status PENDING  Incomplete  Respiratory Panel by PCR     Status: Abnormal   Collection Time: 04/15/18  1:46 AM  Result Value Ref Range Status   Adenovirus NOT DETECTED NOT DETECTED Final   Coronavirus 229E NOT DETECTED NOT DETECTED Final    Comment: (NOTE) The Coronavirus on the Respiratory Panel, DOES NOT test for the novel  Coronavirus (2019 nCoV)     Coronavirus HKU1 NOT DETECTED NOT DETECTED Final   Coronavirus NL63 NOT DETECTED NOT DETECTED Final   Coronavirus OC43 NOT DETECTED NOT DETECTED Final   Metapneumovirus NOT DETECTED NOT DETECTED Final   Rhinovirus / Enterovirus NOT DETECTED NOT DETECTED Final   Influenza A NOT DETECTED NOT DETECTED Final   Influenza B NOT DETECTED NOT DETECTED Final   Parainfluenza Virus 1 NOT DETECTED NOT DETECTED Final   Parainfluenza Virus 2 NOT DETECTED NOT DETECTED Final   Parainfluenza Virus 3 DETECTED (A) NOT DETECTED Final   Parainfluenza Virus 4 NOT DETECTED NOT DETECTED Final   Respiratory Syncytial Virus NOT DETECTED NOT DETECTED Final   Bordetella pertussis NOT DETECTED NOT DETECTED Final   Chlamydophila pneumoniae NOT DETECTED NOT DETECTED Final   Mycoplasma pneumoniae NOT DETECTED NOT DETECTED Final    Comment: Performed at [facility], [address], [place], [place] [address]  MRSA PCR Screening     Status: None   Collection Time: 04/15/18  1:46 AM  Result Value Ref Range Status   MRSA by PCR NEGATIVE NEGATIVE Final    Comment:        The GeneXpert MRSA Assay (FDA approved for NASAL specimens only), is one component of a comprehensive MRSA colonization surveillance program. It is not intended to diagnose MRSA infection nor to guide or monitor treatment for MRSA infections. Performed at [facility], [place] [address], [place], [place] [address]   Culture, sputum-assessment     Status: None   Collection Time: 04/16/18  6:01 PM  Result Value Ref Range Status   Specimen Description SPU EXPECTORATED  Final   Special Requests Normal  Final   Sputum evaluation   Final    THIS SPECIMEN IS ACCEPTABLE FOR SPUTUM CULTURE Performed at [facility], [place] [address], [place], [place] [address]    Report Status 04/16/2018 FINAL  Final  Culture, respiratory     Status: None (Preliminary result)   Collection Time: 04/16/18  6:01 PM  Result Value  Ref Range Status   Specimen Description   Final  SPU EXPECTORATED Performed at [facility], [place] [address], [place], [place] [address]    Special Requests   Final    Normal Reflexed from [id number] Performed at [facility], [place] [address], [place], [place] [address]    Gram Stain   Final    NO WBC SEEN NO ORGANISMS SEEN Performed at [facility], [place] [address], [place], [place] [address]    Culture PENDING  Incomplete   Report Status PENDING  Incomplete     Radiology Studies: No results found.  Scheduled Meds: . amLODipine  5 mg Oral Daily  . aspirin EC  81 mg Oral Daily  . azithromycin  500 mg Oral QHS  . Chlorhexidine Gluconate Cloth  6 each Topical Daily  . divalproex  250 mg Oral Daily  . enoxaparin (LOVENOX) injection  40 mg Subcutaneous Q24H  . gabapentin  600 mg Oral TID  . glipiZIDE  5 mg Oral Daily  . hydrochlorothiazide  25 mg Oral Daily  . insulin aspart  0-5 Units Subcutaneous QHS  . insulin aspart  0-9 Units Subcutaneous TID WC  . insulin glargine  10 Units Subcutaneous Daily  . losartan  100 mg Oral Daily  . mouth rinse  15 mL Mouth Rinse BID  . metFORMIN  500 mg Oral BID WC  . methadone  5 mg Oral TID  . metoprolol tartrate  25 mg Oral QHS  . metoprolol tartrate  50 mg Oral Daily  . nortriptyline  25 mg Oral QHS  . ondansetron  4 mg Oral Daily  . sertraline  50 mg Oral Daily  . simvastatin  20 mg Oral QHS   Continuous Infusions: . cefTRIAXone (ROCEPHIN)  IV Stopped (04/16/18 2105)     LOS: 3 days   Domenic Polite, MD   If 7PM-7AM, please contact night-coverage 04/17/2018, 10:57 AM

## 2018-04-17 NOTE — Progress Notes (Signed)
Patient rested well overnight, remains on 5L HFNC.  Continues to desat as low as 84% with activity.

## 2018-04-17 NOTE — Progress Notes (Signed)
Infection Prevention  Received call from nurse  Regarding: negative COVID test and positive parainfluenza. Patient improving and oxygen at 3l/min with plan to reduce to 2l/min. IP Recommendation:discuss with provider and if clinical picture aligns with negative test may d/c precautions. For now they are leaving on droplet for parainfluenza.

## 2018-04-18 DIAGNOSIS — J189 Pneumonia, unspecified organism: Secondary | ICD-10-CM

## 2018-04-18 DIAGNOSIS — F1190 Opioid use, unspecified, uncomplicated: Secondary | ICD-10-CM

## 2018-04-18 DIAGNOSIS — G4737 Central sleep apnea in conditions classified elsewhere: Secondary | ICD-10-CM

## 2018-04-18 DIAGNOSIS — A419 Sepsis, unspecified organism: Principal | ICD-10-CM

## 2018-04-18 LAB — GLUCOSE, CAPILLARY
Glucose-Capillary: 89 mg/dL (ref 70–99)
Glucose-Capillary: 135 mg/dL — ABNORMAL HIGH (ref 70–99)
Glucose-Capillary: 118 mg/dL — ABNORMAL HIGH (ref 70–99)
Glucose-Capillary: 133 mg/dL — ABNORMAL HIGH (ref 70–99)

## 2018-04-18 MED ORDER — DOXEPIN HCL 25 MG PO CAPS
25.0000 mg | ORAL_CAPSULE | Freq: Every evening | ORAL | Status: DC | PRN
  Administered 2018-04-18: 25 mg via ORAL
  Filled 2018-04-18 (×2): qty 1

## 2018-04-18 NOTE — Progress Notes (Signed)
PROGRESS NOTE  [name]  YKD:[id number] DOB: 07-27-1955 DOA: 04/14/2018 PCP: Leanna Battles, MD   Brief Narrative: [name] is a 63 y.o.malewith a history of tobacco use, IDDM, HTN, chronic pain syndrome on methadone, s/p cochlear implant, anxiety disorder, and HLD who presented to the ED with respiratory distress and cough as well as fever worsening over the previous week despite empiric abx as outpatient. No known sick contacts. He was febrile to 102.32F, required 15L NRB, CXR showing right upper and lower lobe opacities, was admitted 3/29 on empiric antibiotics with RVP and covid testing pending. Defervesced quickly. Parainfluenza virus was noted to be positive and covid test negative. Droplet precautions were continued, he was transferred from SDU to the floor 4/2 on decreasing supplemental oxygen requirements.   Assessment & Plan: Principal Problem:   Acute respiratory failure with hypoxia and hypercapnia (HCC) Active Problems:   Healthcare-associated pneumonia   Dyslipidemia   Essential hypertension   Severe central sleep apnea comorbid with prescribed opioid use   Diabetes mellitus type 1 with manifestations ([place])   Sepsis ([place])  Acute hypoxic respiratory failure: Due to pneumonia, parainfluenza infection, superimposed on suspected undiagnosed COPD due to long term tobacco use. Covid testing negative. While this could be a false negative (sensitivity 70-80%), suspect nasopharyngeal sample was adequate with positive parainfluenza result and coinfection in this setting less likely. Initial lymphocyte 1.3k as part of normal diff and WBC 8.8k.  - Continue monitoring as inpatient and wean oxygen as tolerated. Typical progression of symptoms, were this to include coinfection with covid, presuming initial symptoms developed ~1week prior to ED visit per EDP notes, would place him at highest risk for ARDS prior to right now. Will ambulate with pulse oximetry with plans to discharge on  oxygen if needed if gradual improvement continues over next 24 hours.  - Continue azithromycin and ceftriaxone empirically given focal infiltrates. Final day of 5 day course 4/3.   Sepsis due to pneumonia: Improving. See above.   Central sleep apnea:  - Agree that foregoing NIPPV while admitted remains prudent  IDDM:  - Glucose well controlled. Continue lantus 8u + SSI. Holding metformin, glipizide.   HTN:  - Continue norvasc, metoprolol, losartan  Chronic pain syndrome:  - Continue methadone 5mg  TID, gabapentin 600mg  TID, prn robaxin  Depression/anxiety:  - Continue nortriptyline, zoloft - Continue depakote, note level was low.   HLD:  - Continue statin  Stage III CKD:  - Monitor  DVT prophylaxis: Lovenox Code Status: Full Family Communication: None at bedside Disposition Plan: [place] home if improving over next 24 hours.   Consultants:   None  Procedures:   None  Antimicrobials:  Azithromycin 3/29 - 4/2  Ceftriaxone 3/29 >>    Subjective: Denies having dyspnea despite still requiring supplemental oxygen. No chest pain. Has heel spur chronically, otherwise no pain currently. No fevers. Feels well enough to go home.   Objective: Vitals:   04/18/18 0600 04/18/18 0731 04/18/18 0800 04/18/18 1200  BP: (!) 132/41  105/74   Pulse: 70  100   Resp: 11  13   Temp:  98.6 F (37 C)  98.2 F (36.8 C)  TempSrc:  Oral  Oral  SpO2: 97%  90%   Weight:      Height:        Intake/Output Summary (Last 24 hours) at 04/18/2018 1226 Last data filed at 04/18/2018 1143 Gross per 24 hour  Intake 100 ml  Output 1700 ml  Net -1600  ml   Filed Weights   04/14/18 2205  Weight: 81.6 kg    Gen: Chronically ill-appearing male in no distress  Pulm: Non-labored breathing supplemental oxygen. Clear but very diminished.  CV: Regular rate and rhythm. No murmur, rub, or gallop. No JVD, no pedal edema. GI: Abdomen soft, non-tender, non-distended, with normoactive bowel  sounds. No organomegaly or masses felt. Ext: Warm, no deformities Skin: No rashes, lesions or ulcers Neuro: Alert and oriented. No focal neurological deficits. Psych: Judgement and insight appear normal. Mood & affect appropriate.   Data Reviewed: I have personally reviewed following labs and imaging studies  CBC: Recent Labs  Lab 04/14/18 2114 04/15/18 0404 04/16/18 0610 04/17/18 0500  WBC 10.5 8.8 5.5 5.1  NEUTROABS 9.1* 6.5  --   --   HGB 13.4 11.3* 11.3* 11.2*  HCT 41.7 36.8* 37.7* 35.7*  MCV 90.5 93.9 96.4 93.9  PLT 198 186 155 025   Basic Metabolic Panel: Recent Labs  Lab 04/14/18 2114 04/15/18 0404 04/16/18 0610 04/17/18 0500  NA 139 138 138 137  K 4.5 4.4 3.8 4.0  CL 101 103 101 101  CO2 28 26 30 29   GLUCOSE 186* 107* 138* 90  BUN 23 22 27* 27*  CREATININE 1.27* 1.29* 1.32* 1.25*  CALCIUM 8.8* 8.0* 8.1* 8.4*   GFR: Estimated Creatinine Clearance: 61.6 mL/min (A) (by C-G formula based on SCr of 1.25 mg/dL (H)). Liver Function Tests: Recent Labs  Lab 04/14/18 2114 04/15/18 0404 04/16/18 0610 04/17/18 0500  AST 17 22 16  13*  ALT 15 15 14 14   ALKPHOS 72 55 51 51  BILITOT 0.7 0.9 0.3 0.6  PROT 7.6 6.1* 6.0* 6.3*  ALBUMIN 4.2 3.2* 3.1* 3.2*   No results for input(s): LIPASE, AMYLASE in the last 168 hours. No results for input(s): AMMONIA in the last 168 hours. Coagulation Profile: No results for input(s): INR, PROTIME in the last 168 hours. Cardiac Enzymes: No results for input(s): CKTOTAL, CKMB, CKMBINDEX, TROPONINI in the last 168 hours. BNP (last 3 results) No results for input(s): PROBNP in the last 8760 hours. HbA1C: No results for input(s): HGBA1C in the last 72 hours. CBG: Recent Labs  Lab 04/17/18 1259 04/17/18 1852 04/17/18 2100 04/18/18 0723 04/18/18 1140  GLUCAP 94 80 145* 89 135*   Lipid Profile: No results for input(s): CHOL, HDL, LDLCALC, TRIG, CHOLHDL, LDLDIRECT in the last 72 hours. Thyroid Function Tests: No results for  input(s): TSH, T4TOTAL, FREET4, T3FREE, THYROIDAB in the last 72 hours. Anemia Panel: No results for input(s): VITAMINB12, FOLATE, FERRITIN, TIBC, IRON, RETICCTPCT in the last 72 hours. Urine analysis:    Component Value Date/Time   COLORURINE YELLOW 04/14/2018 2114   APPEARANCEUR CLEAR 04/14/2018 2114   LABSPEC 1.015 04/14/2018 2114   PHURINE 6.0 04/14/2018 2114   GLUCOSEU NEGATIVE 04/14/2018 2114   HGBUR NEGATIVE 04/14/2018 2114   BILIRUBINUR NEGATIVE 04/14/2018 2114   KETONESUR 5 (A) 04/14/2018 2114   PROTEINUR >=300 (A) 04/14/2018 2114   NITRITE NEGATIVE 04/14/2018 2114   LEUKOCYTESUR NEGATIVE 04/14/2018 2114   Recent Results (from the past 240 hour(s))  Blood Culture (routine x 2)     Status: None (Preliminary result)   Collection Time: 04/14/18  9:14 PM  Result Value Ref Range Status   Specimen Description   Final    BLOOD LEFT ANTECUBITAL Performed at [facility], [place] [address], [place], [place] [address]    Special Requests   Final    BOTTLES DRAWN AEROBIC AND  ANAEROBIC Blood Culture results may not be optimal due to an inadequate volume of blood received in culture bottles Performed at [facility], [place] [address], [place], [place] [address]    Culture   Final    NO GROWTH 2 DAYS Performed at [facility] [address], [place], [place] [address]    Report Status PENDING  Incomplete  Blood Culture (routine x 2)     Status: None (Preliminary result)   Collection Time: 04/14/18  9:19 PM  Result Value Ref Range Status   Specimen Description   Final    BLOOD BLOOD RIGHT HAND Performed at [facility] [address], [place], [place] [address]    Special Requests   Final    BOTTLES DRAWN AEROBIC AND ANAEROBIC Blood Culture adequate volume Performed at [facility] [address], [place], [place] [address]    Culture   Final    NO GROWTH 2 DAYS Performed at [facility] [address], [place], [place] [address]    Report Status PENDING  Incomplete  Novel Coronavirus, NAA (hospital order; send-out to ref lab)     Status: None   Collection Time: 04/14/18  9:27 PM  Result Value Ref Range Status   SARS-CoV-2, NAA NOT DETECTED NOT DETECTED Final    Comment: Negative (Not Detected) results do not exclude infection caused by SARS CoV 2 and should not be used as the sole basis for treatment or other patient management decisions. Optimum specimen types and timing for peak viral levels during infections caused  by SARS CoV 2 have not been determined. Collection of multiple specimens (types and time points) from the same patient may be necessary to detect the virus. Improper specimen collection and handling, sequence variability underlying assay primers and or probes, or the presence of organisms in  quantities less than the limit of detection of the assay may lead to false negative results. Positive and negative predictive values of testing are highly dependent on prevalence. False negative results are more likely when prevalence of disease is high. (NOTE) The expected result is Negative (Not Detected). The SARS CoV 2 test is intended for the presumptive qualitative  detection of nucleic acid from SARS CoV 2 in upper and lower  respir atory specimens. Testing methodology is real time RT PCR. Test results must be correlated with clinical presentation and  evaluated in the context of other laboratory and epidemiologic data.  Test performance can be affected because the epidemiology and  clinical spectrum of infection caused by SARS CoV 2 is not fully  known. For example, the optimum types of specimens to collect and  when during the course of infection these specimens are most likely  to contain detectable viral RNA may not be known. This test has not been Food and Drug Administration (FDA) cleared or  approved and has been authorized by FDA under an Emergency Use   Authorization (EUA). The test is only authorized for the duration of  the declaration that circumstances exist justifying the authorization  of emergency use of in vitro diagnostic tests for detection and or  diagnosis of SARS CoV 2 under Section 564(b)(1) of the Act, 21 U.S.C.  section [phone] 3(b)(1), unless the authorization is terminated or   revoked sooner. [facility] is certified under the  Clinical Laboratory Improvement Amendments of 1988 (CLIA), 42 U.S.C.  section [phone], to perform high complexity tests. Performed at [facility] CLIA [id number] [address], [place], [place] [address] Laboratory Director: Loleta Books, MD Performed at [facility], [place] [address], [place], [place] [address]    Coronavirus Source NASOPHARYNGEAL  Final    Comment: Performed at [facility], [place] [address] [place], [place] [address]  Respiratory Panel by PCR     Status: Abnormal   Collection Time: 04/15/18  1:46 AM  Result Value Ref Range Status   Adenovirus NOT DETECTED NOT DETECTED Final   Coronavirus 229E NOT DETECTED NOT DETECTED Final    Comment: (NOTE) The Coronavirus on the Respiratory Panel, DOES NOT test for the novel  Coronavirus (2019 nCoV)    Coronavirus HKU1 NOT DETECTED NOT DETECTED Final   Coronavirus NL63 NOT DETECTED NOT DETECTED Final   Coronavirus OC43 NOT DETECTED NOT DETECTED Final   Metapneumovirus NOT DETECTED NOT DETECTED Final   Rhinovirus / Enterovirus NOT DETECTED NOT DETECTED Final   Influenza A NOT DETECTED NOT DETECTED Final   Influenza B NOT DETECTED NOT DETECTED Final   Parainfluenza Virus 1 NOT DETECTED NOT DETECTED Final   Parainfluenza Virus 2 NOT DETECTED NOT DETECTED Final   Parainfluenza Virus 3 DETECTED (A) NOT DETECTED Final   Parainfluenza Virus 4 NOT DETECTED NOT DETECTED Final   Respiratory Syncytial Virus NOT DETECTED NOT DETECTED Final   Bordetella pertussis  NOT DETECTED NOT DETECTED Final   Chlamydophila pneumoniae NOT DETECTED NOT DETECTED Final   Mycoplasma pneumoniae NOT DETECTED NOT DETECTED Final    Comment: Performed at [facility], [place]. [address], [place], [place] [address]  MRSA PCR Screening     Status: None   Collection Time: 04/15/18  1:46 AM  Result Value Ref Range Status   MRSA by PCR NEGATIVE NEGATIVE Final    Comment:        The GeneXpert MRSA Assay (FDA approved for NASAL specimens only), is one component of a comprehensive MRSA colonization surveillance program. It is not intended to diagnose MRSA infection nor to guide or monitor treatment for MRSA infections. Performed at [facility], [place] [address], [place], [place] [address]   Culture, sputum-assessment     Status: None   Collection Time: 04/16/18  6:01 PM  Result Value Ref Range Status   Specimen Description SPU EXPECTORATED  Final   Special Requests Normal  Final   Sputum evaluation   Final    THIS SPECIMEN IS ACCEPTABLE FOR SPUTUM CULTURE Performed at [facility], [place] [address], [place], [place] [address]    Report Status 04/16/2018 FINAL  Final  Culture, respiratory     Status: None (Preliminary result)   Collection Time: 04/16/18  6:01 PM  Result Value Ref Range Status   Specimen Description   Final    SPU EXPECTORATED Performed at [facility] [address], [place], [place] [address]    Special Requests   Final    Normal Reflexed from [id number] Performed at [facility], [place] [address], [place], [place] [address]    Gram Stain NO WBC SEEN NO ORGANISMS SEEN   Final   Culture   Final    CULTURE REINCUBATED FOR BETTER GROWTH Performed at [facility], [place] [address] [place], [place] [address]    Report Status PENDING  Incomplete      Radiology Studies: No results found.  Scheduled Meds: . amLODipine  5 mg Oral Daily  .  aspirin EC  81 mg Oral Daily   . azithromycin  500 mg Oral QHS  . Chlorhexidine Gluconate Cloth  6 each Topical Daily  . divalproex  250 mg Oral Daily  . enoxaparin (LOVENOX) injection  40 mg Subcutaneous Q24H  . gabapentin  600 mg Oral TID  . insulin aspart  0-5 Units Subcutaneous QHS  . insulin aspart  0-9 Units Subcutaneous TID WC  . insulin glargine  8 Units Subcutaneous Daily  . losartan  100 mg Oral Daily  . mouth rinse  15 mL Mouth Rinse BID  . methadone  5 mg Oral TID  . metoprolol tartrate  25 mg Oral QHS  . metoprolol tartrate  50 mg Oral Daily  . nortriptyline  25 mg Oral QHS  . ondansetron  4 mg Oral Daily  . sertraline  50 mg Oral Daily  . simvastatin  20 mg Oral QHS   Continuous Infusions: . cefTRIAXone (ROCEPHIN)  IV Stopped (04/17/18 2136)     LOS: 4 days   Time spent: 25 minutes.  Patrecia Pour, MD [facility] [email or web address] Password [facility] 04/18/2018, 12:26 PM

## 2018-04-19 LAB — BASIC METABOLIC PANEL
Anion gap: 8 (ref 5–15)
BUN: 32 mg/dL — ABNORMAL HIGH (ref 8–23)
CO2: 31 mmol/L (ref 22–32)
Calcium: 9 mg/dL (ref 8.9–10.3)
Chloride: 102 mmol/L (ref 98–111)
Creatinine, Ser: 1.3 mg/dL — ABNORMAL HIGH (ref 0.61–1.24)
GFR calc Af Amer: >60 mL/min (ref >60)
GFR calc non Af Amer: 58 mL/min — ABNORMAL LOW (ref >60)
Glucose, Bld: 129 mg/dL — ABNORMAL HIGH (ref 70–99)
Potassium: 4.3 mmol/L (ref 3.5–5.1)
Sodium: 141 mmol/L (ref 135–145)

## 2018-04-19 LAB — GLUCOSE, CAPILLARY
Glucose-Capillary: 124 mg/dL — ABNORMAL HIGH (ref 70–99)
Glucose-Capillary: 175 mg/dL — ABNORMAL HIGH (ref 70–99)

## 2018-04-19 LAB — CULTURE, RESPIRATORY W GRAM STAIN
Culture: NORMAL
Gram Stain: NONE SEEN
Special Requests: NORMAL

## 2018-04-19 LAB — CULTURE, RESPIRATORY

## 2018-04-19 NOTE — Progress Notes (Signed)
[facility] alerted of home 02 need. 02 tank to be delivered to pt room. Marney Doctor RN,BSN [phone]

## 2018-04-19 NOTE — Discharge Summary (Signed)
Physician Discharge Summary  [name] TML:[id number] DOB: 1955/04/18 DOA: 04/14/2018  PCP: Leanna Battles, MD  Admit date: 04/14/2018 Discharge date: 04/19/2018  Admitted From: Home Disposition: Home   Recommendations for Outpatient Follow-up:  1. Follow up with PCP in 1-2 weeks 2. Please obtain BMP/CBC in one week  Home Health: None Equipment/Devices: 3L supplemental oxygen Discharge Condition: Stable CODE STATUS: Full Diet recommendation: Carb-modified  Brief/Interim Summary: [name] is a 63 y.o.malewith a history of tobacco use, IDDM, HTN, chronic pain syndrome on methadone, s/p cochlear implant, anxiety disorder, and HLD who presented to the ED with respiratory distress and cough as well as fever worsening over the previous week despite empiric abx as outpatient. No known sick contacts. He was febrile to 102.25F, required 15L NRB, CXR showing right upper and lower lobe opacities, was admitted 3/29 on empiric antibiotics with RVP and covid testing pending. Defervesced quickly. Parainfluenza virus was noted to be positive and covid test negative. Droplet precautions were continued, he was transferred from SDU to the floor 4/2 on decreasing supplemental oxygen requirements.  Discharge Diagnoses:  Principal Problem:   Acute respiratory failure with hypoxia and hypercapnia (HCC) Active Problems:   Healthcare-associated pneumonia   Dyslipidemia   Essential hypertension   Severe central sleep apnea comorbid with prescribed opioid use   Diabetes mellitus type 1 with manifestations ([place])   Sepsis ([place])  Acute hypoxic respiratory failure: Due to pneumonia, parainfluenza infection, superimposed on suspected undiagnosed COPD due to long term tobacco use. Covid testing negative. While this could be a false negative (sensitivity 70-80%), suspect nasopharyngeal sample was adequate with positive parainfluenza result and coinfection in this setting less likely. Initial lymphocyte 1.3k  as part of normal diff and WBC 8.8k.  - Continue monitoring as inpatient and wean oxygen as tolerated. Typical progression of symptoms, were this to include coinfection with covid, presuming initial symptoms developed ~1week prior to ED visit per EDP notes, would place him outside window of highest risk for ARDS/decompensation. 3L oxygen prescribed at discharge.  - Continued azithromycin and ceftriaxone empirically given focal infiltrates x5 days (completed prior to discharge).  Sepsis due to pneumonia: Improving. See above.   Central sleep apnea:  - CPAP at home.  IDDM:  - Glucose well controlled. Continue home medications after discharge.   HTN:  - Continue norvasc, metoprolol, losartan  Chronic pain syndrome:  - Continue methadone 5mg  TID, gabapentin 600mg  TID, prn robaxin  Depression/anxiety:  - Continue nortriptyline, zoloft - Continue depakote, note level was low.   HLD:  - Continue statin  Stage III CKD:  - Monitor  Discharge Instructions Discharge Instructions    Call MD for:  difficulty breathing, headache or visual disturbances   Complete by:  As directed    Call MD for:  temperature >100.4   Complete by:  As directed    Diet Carb Modified   Complete by:  As directed    Discharge instructions   Complete by:  As directed    You were admitted for parainfluenza virus infection. You were also treated for pneumonia (have completed antibiotics). Your test for covid was negative. It is recommended that you still take precautions to prevent the spread which were reviewed at discharge. This includes limiting interaction with people, following the stay-at-home order, and taking precautions including not sharing a bedroom or bathroom with anyone (if this is possible) until 14 days after symptom onset. You will be tested for low oxygen while walking to see if you need  to continue using oxygen after discharge. It is important that you follow up with Dr. Philip Aspen in the next  week for recheck or to seek medical attention sooner if your symptoms return/worsen.   Increase activity slowly   Complete by:  As directed      Allergies as of 04/19/2018      Reactions   Codeine Nausea And Vomiting   Tetracyclines & Related Hives      Medication List    TAKE these medications   amLODipine 5 MG tablet Commonly known as:  NORVASC Take 5 mg by mouth daily.   aspirin EC 81 MG tablet Take 81 mg by mouth daily.   Basaglar KwikPen 100 UNIT/ML Sopn Inject 16 Units into the skin daily.   benzonatate 100 MG capsule Commonly known as:  TESSALON Take 100 mg by mouth 2 (two) times daily as needed for cough.   cefdinir 300 MG capsule Commonly known as:  OMNICEF Take 300 mg by mouth 2 (two) times daily.   divalproex 250 MG 24 hr tablet Commonly known as:  DEPAKOTE ER Take 250 mg by mouth daily.   doxepin 25 MG capsule Commonly known as:  SINEQUAN Take 25 mg by mouth at bedtime.   gabapentin 300 MG capsule Commonly known as:  NEURONTIN Take 300 mg by mouth 3 (three) times daily.   glipiZIDE 2.5 MG 24 hr tablet Commonly known as:  GLUCOTROL XL Take 2.5-5 mg by mouth See admin instructions. 5 mg in the am and 1 tablet dinner time   hydrochlorothiazide 25 MG tablet Commonly known as:  HYDRODIURIL Take 25 mg by mouth daily.   HYDROcodone-acetaminophen 5-325 MG tablet Commonly known as:  NORCO/VICODIN Take 1 tablet by mouth 3 (three) times daily as needed for moderate pain.   losartan 100 MG tablet Commonly known as:  COZAAR Take 100 mg by mouth daily.   metFORMIN 500 MG 24 hr tablet Commonly known as:  GLUCOPHAGE-XR Take 500 mg by mouth daily with breakfast.   methadone 5 MG tablet Commonly known as:  DOLOPHINE Take 5 mg by mouth 4 (four) times daily.   metoprolol tartrate 25 MG tablet Commonly known as:  LOPRESSOR Take 25-50 mg by mouth See admin instructions. 50 mg in the am and 25 mg qhs   nortriptyline 25 MG capsule Commonly known as:   PAMELOR Take 25 mg by mouth at bedtime.   sertraline 50 MG tablet Commonly known as:  ZOLOFT Take 50 mg by mouth daily.   simvastatin 20 MG tablet Commonly known as:  ZOCOR Take 20 mg by mouth at bedtime.      Follow-up Information    Leanna Battles, MD. Schedule an appointment as soon as possible for a visit in 1 week(s).   Specialty:  Internal Medicine Contact information: [place] [address] [phone]          Allergies  Allergen Reactions  . Codeine Nausea And Vomiting  . Tetracyclines & Related Hives    Consultations:  None  Procedures/Studies: Dg Chest Port 1 View  Result Date: 04/14/2018 CLINICAL DATA:  Short of breath. EXAM: PORTABLE CHEST 1 VIEW COMPARISON:  None. FINDINGS: Normal heart size. No pleural effusion or edema. There are a few ill-defined patchy opacities within the right upper lobe and right lower lobe. Left lung appears clear. IMPRESSION: 1. Ill-defined, patchy opacities noted within the right upper lobe and right lower lobe. Electronically Signed   By: Kerby Moors M.D.   On: 04/14/2018  22:21    Subjective: Feels well, breathing better every day. No shortness of breath at rest this morning. No fevers, chills. Wants to go home.   Discharge Exam: Vitals:   04/18/18 2123 04/19/18 0542  BP: (!) 141/65 (!) 149/71  Pulse: 71 72  Resp: 19 18  Temp: 98.3 F (36.8 C) 98.4 F (36.9 C)  SpO2: 91% 90%   General: Pt is alert, awake, not in acute distress Cardiovascular: RRR, S1/S2 +, no rubs, no gallops Respiratory: Nonlabored, normal rate, very diminished globally without wheezing or crackles. Abdominal: Soft, NT, ND, bowel sounds + Extremities: No edema, no cyanosis  Labs: Basic Metabolic Panel: Recent Labs  Lab 04/14/18 2114 04/15/18 0404 04/16/18 0610 04/17/18 0500 04/19/18 0431  NA 139 138 138 137 141  K 4.5 4.4 3.8 4.0 4.3  CL 101 103 101 101 102  CO2 28 26 30 29 31   GLUCOSE 186* 107* 138* 90 129*   BUN 23 22 27* 27* 32*  CREATININE 1.27* 1.29* 1.32* 1.25* 1.30*  CALCIUM 8.8* 8.0* 8.1* 8.4* 9.0   Liver Function Tests: Recent Labs  Lab 04/14/18 2114 04/15/18 0404 04/16/18 0610 04/17/18 0500  AST 17 22 16  13*  ALT 15 15 14 14   ALKPHOS 72 55 51 51  BILITOT 0.7 0.9 0.3 0.6  PROT 7.6 6.1* 6.0* 6.3*  ALBUMIN 4.2 3.2* 3.1* 3.2*   CBC: Recent Labs  Lab 04/14/18 2114 04/15/18 0404 04/16/18 0610 04/17/18 0500  WBC 10.5 8.8 5.5 5.1  NEUTROABS 9.1* 6.5  --   --   HGB 13.4 11.3* 11.3* 11.2*  HCT 41.7 36.8* 37.7* 35.7*  MCV 90.5 93.9 96.4 93.9  PLT 198 186 155 166   CBG: Recent Labs  Lab 04/18/18 0723 04/18/18 1140 04/18/18 1606 04/18/18 2129 04/19/18 0723  GLUCAP 89 135* 118* 133* 124*   Urinalysis    Component Value Date/Time   COLORURINE YELLOW 04/14/2018 2114   APPEARANCEUR CLEAR 04/14/2018 2114   LABSPEC 1.015 04/14/2018 2114   PHURINE 6.0 04/14/2018 2114   GLUCOSEU NEGATIVE 04/14/2018 2114   HGBUR NEGATIVE 04/14/2018 2114   BILIRUBINUR NEGATIVE 04/14/2018 2114   KETONESUR 5 (A) 04/14/2018 2114   PROTEINUR >=300 (A) 04/14/2018 2114   NITRITE NEGATIVE 04/14/2018 2114   LEUKOCYTESUR NEGATIVE 04/14/2018 2114    Microbiology Recent Results (from the past 240 hour(s))  Blood Culture (routine x 2)     Status: None (Preliminary result)   Collection Time: 04/14/18  9:14 PM  Result Value Ref Range Status   Specimen Description   Final    BLOOD LEFT ANTECUBITAL Performed at [facility], [place] [address], [place], [place] [address]    Special Requests   Final    BOTTLES DRAWN AEROBIC AND ANAEROBIC Blood Culture results may not be optimal due to an inadequate volume of blood received in culture bottles Performed at [place] [address] [place], [place] [address]    Culture   Final    NO GROWTH 3 DAYS Performed at [facility], [place] [address] [place], [place] [address]    Report Status PENDING  Incomplete   Blood Culture (routine x 2)     Status: None (Preliminary result)   Collection Time: 04/14/18  9:19 PM  Result Value Ref Range Status   Specimen Description   Final    BLOOD BLOOD RIGHT HAND Performed at [facility] [address] [place], [place] [address]    Special Requests  Final    BOTTLES DRAWN AEROBIC AND ANAEROBIC Blood Culture adequate volume Performed at [place] [address], [place], [place] [address]    Culture   Final    NO GROWTH 3 DAYS Performed at [facility], [place] [address], [place], [place] [address]    Report Status PENDING  Incomplete  Novel Coronavirus, NAA (hospital order; send-out to ref lab)     Status: None   Collection Time: 04/14/18  9:27 PM  Result Value Ref Range Status   SARS-CoV-2, NAA NOT DETECTED NOT DETECTED Final    Comment: Negative (Not Detected) results do not exclude infection caused by SARS CoV 2 and should not be used as the sole basis for treatment or other patient management decisions. Optimum specimen types and timing for peak viral levels during infections caused  by SARS CoV 2 have not been determined. Collection of multiple specimens (types and time points) from the same patient may be necessary to detect the virus. Improper specimen collection and handling, sequence variability underlying assay primers and or probes, or the presence of organisms in  quantities less than the limit of detection of the assay may lead to false negative results. Positive and negative predictive values of testing are highly dependent on prevalence. False negative results are more likely when prevalence of disease is high. (NOTE) The expected result is Negative (Not Detected). The SARS CoV 2 test is intended for the presumptive qualitative  detection of nucleic acid from SARS CoV 2 in upper and lower  respir atory specimens. Testing methodology is real time RT PCR. Test results must be correlated with  clinical presentation and  evaluated in the context of other laboratory and epidemiologic data.  Test performance can be affected because the epidemiology and  clinical spectrum of infection caused by SARS CoV 2 is not fully  known. For example, the optimum types of specimens to collect and  when during the course of infection these specimens are most likely  to contain detectable viral RNA may not be known. This test has not been Food and Drug Administration (FDA) cleared or  approved and has been authorized by FDA under an Emergency Use  Authorization (EUA). The test is only authorized for the duration of  the declaration that circumstances exist justifying the authorization  of emergency use of in vitro diagnostic tests for detection and or  diagnosis of SARS CoV 2 under Section 564(b)(1) of the Act, 21 U.S.C.  section [phone] 3(b)(1), unless the authorization is terminated or   revoked sooner. [facility] is certified under the  Clinical Laboratory Improvement Amendments of 1988 (CLIA), 42 U.S.C.  section [phone], to perform high complexity tests. Performed at [place] [id number], [place], [place] [address] Laboratory Director: Loleta Books, MD Performed at [facility], [place] [address], [place], [place] [address]    Coronavirus Source NASOPHARYNGEAL  Final    Comment: Performed at [facility], [place] [address] [place], [place] [address]  Respiratory Panel by PCR     Status: Abnormal   Collection Time: 04/15/18  1:46 AM  Result Value Ref Range Status   Adenovirus NOT DETECTED NOT DETECTED Final   Coronavirus 229E NOT DETECTED NOT DETECTED Final    Comment: (NOTE) The Coronavirus on the Respiratory Panel, DOES NOT test for the novel  Coronavirus (2019 nCoV)    Coronavirus HKU1 NOT DETECTED NOT DETECTED  Final   Coronavirus NL63 NOT DETECTED NOT DETECTED Final   Coronavirus OC43  NOT DETECTED NOT DETECTED Final   Metapneumovirus NOT DETECTED NOT DETECTED Final   Rhinovirus / Enterovirus NOT DETECTED NOT DETECTED Final   Influenza A NOT DETECTED NOT DETECTED Final   Influenza B NOT DETECTED NOT DETECTED Final   Parainfluenza Virus 1 NOT DETECTED NOT DETECTED Final   Parainfluenza Virus 2 NOT DETECTED NOT DETECTED Final   Parainfluenza Virus 3 DETECTED (A) NOT DETECTED Final   Parainfluenza Virus 4 NOT DETECTED NOT DETECTED Final   Respiratory Syncytial Virus NOT DETECTED NOT DETECTED Final   Bordetella pertussis NOT DETECTED NOT DETECTED Final   Chlamydophila pneumoniae NOT DETECTED NOT DETECTED Final   Mycoplasma pneumoniae NOT DETECTED NOT DETECTED Final    Comment: Performed at [facility], [place] [address], [place], [place] [address]  MRSA PCR Screening     Status: None   Collection Time: 04/15/18  1:46 AM  Result Value Ref Range Status   MRSA by PCR NEGATIVE NEGATIVE Final    Comment:        The GeneXpert MRSA Assay (FDA approved for NASAL specimens only), is one component of a comprehensive MRSA colonization surveillance program. It is not intended to diagnose MRSA infection nor to guide or monitor treatment for MRSA infections. Performed at [facility], [place] [address] [place], [place] [address]   Culture, sputum-assessment     Status: None   Collection Time: 04/16/18  6:01 PM  Result Value Ref Range Status   Specimen Description SPU EXPECTORATED  Final   Special Requests Normal  Final   Sputum evaluation   Final    THIS SPECIMEN IS ACCEPTABLE FOR SPUTUM CULTURE Performed at [facility], [place] [address] [place], [place] [address]    Report Status 04/16/2018 FINAL  Final  Culture, respiratory     Status: None   Collection Time: 04/16/18  6:01 PM  Result Value Ref Range Status   Specimen Description   Final    SPU EXPECTORATED Performed at [place] [address],  [place], [place] [address]    Special Requests   Final    Normal Reflexed from [id number] Performed at [facility], [place] [address] [place], [place] [address]    Gram Stain NO WBC SEEN NO ORGANISMS SEEN   Final   Culture   Final    FEW Consistent with normal respiratory flora. Performed at [facility], [place] [address], [place],  [address]    Report Status 04/19/2018 FINAL  Final    Time coordinating discharge: Approximately 40 minutes  Patrecia Pour, MD  [facility] 04/19/2018, 9:20 AM

## 2018-04-19 NOTE — Progress Notes (Signed)
SATURATION QUALIFICATIONS: (This note is used to comply with regulatory documentation for home oxygen)  Patient Saturations on Room Air at Rest = 92%  Patient Saturations on Room Air while Ambulating = 85%  Patient Saturations on 3 Liters of oxygen while Ambulating = 93%  Patient requires oxygen to maintaining adequate oxygenation while ambulating.

## 2018-04-19 NOTE — Progress Notes (Signed)
PT Cancellation Note  Patient Details Name: [name] MRN: [id number] DOB: 1955/11/27   Cancelled Treatment:     PT order received but eval deferred - RN advises pt is ambulating in halls with staff and no assist needed - PT to sign off.   Alisha Bacus 04/19/2018, 11:55 AM

## 2018-04-20 LAB — CULTURE, BLOOD (ROUTINE X 2)
CULTURE: NO GROWTH
Culture: NO GROWTH
SPECIAL REQUESTS: ADEQUATE

## 2018-04-25 DIAGNOSIS — G4737 Central sleep apnea in conditions classified elsewhere: Secondary | ICD-10-CM | POA: Diagnosis not present

## 2018-04-25 DIAGNOSIS — I10 Essential (primary) hypertension: Secondary | ICD-10-CM | POA: Diagnosis not present

## 2018-04-25 DIAGNOSIS — E1151 Type 2 diabetes mellitus with diabetic peripheral angiopathy without gangrene: Secondary | ICD-10-CM | POA: Diagnosis not present

## 2018-04-25 DIAGNOSIS — R5381 Other malaise: Secondary | ICD-10-CM | POA: Diagnosis not present

## 2018-04-25 DIAGNOSIS — I739 Peripheral vascular disease, unspecified: Secondary | ICD-10-CM | POA: Diagnosis not present

## 2018-04-25 DIAGNOSIS — J122 Parainfluenza virus pneumonia: Secondary | ICD-10-CM | POA: Diagnosis not present

## 2018-04-25 DIAGNOSIS — M545 Low back pain: Secondary | ICD-10-CM | POA: Diagnosis not present

## 2018-04-25 DIAGNOSIS — Z720 Tobacco use: Secondary | ICD-10-CM | POA: Diagnosis not present

## 2018-05-07 ENCOUNTER — Telehealth: Payer: Self-pay | Admitting: Neurology

## 2018-05-07 NOTE — Telephone Encounter (Signed)
Due to current COVID 19 pandemic, our office is severely reducing in office visits, in order to minimize the risk to our patients and healthcare providers.  Pt understands that although there may be some limitations with this type of visit, we will take all precautions to reduce any security or privacy concerns.  Pt understands that this will be treated like an in office visit and we will file with pt's insurance, and there may be a patient responsible charge related to this service. Pt's email is [email or web address]. Pt understands that the [employer] webex software must be downloaded and operational on the device pt plans to use for the visit. Pt understands that the nurse will be calling to go over pt's chart.

## 2018-05-14 ENCOUNTER — Encounter: Payer: Self-pay | Admitting: Neurology

## 2018-05-14 NOTE — Telephone Encounter (Signed)
Called the patient to review their chart and made sure that everything was up to date. Patient informed they didn't have the app downloaded. I offered the DOXY.[email or web address] version. Pt agreed to this. Instructed to make sure they hold on to the text message for the upcoming appointment as it is necessary to access their appointment. Instructed the patient that apx 30 min prior to the appointment the front staff will contact them to make sure they are ready to go for their appointment in case there is any need for troubleshooting it can be completed prior to the appointment time. Pt verbalized understanding. Patient also instructed to completed the sleep scale and reply with answers to that and neck circumference measurements prior to the appointment time.

## 2018-05-15 ENCOUNTER — Encounter: Payer: Self-pay | Admitting: Neurology

## 2018-05-15 ENCOUNTER — Other Ambulatory Visit: Payer: Self-pay

## 2018-05-15 ENCOUNTER — Ambulatory Visit (INDEPENDENT_AMBULATORY_CARE_PROVIDER_SITE_OTHER): Payer: 59 | Admitting: Neurology

## 2018-05-15 DIAGNOSIS — G4737 Central sleep apnea in conditions classified elsewhere: Secondary | ICD-10-CM | POA: Diagnosis not present

## 2018-05-15 DIAGNOSIS — F1190 Opioid use, unspecified, uncomplicated: Secondary | ICD-10-CM

## 2018-05-15 DIAGNOSIS — G4701 Insomnia due to medical condition: Secondary | ICD-10-CM

## 2018-05-15 DIAGNOSIS — F5103 Paradoxical insomnia: Secondary | ICD-10-CM

## 2018-05-15 DIAGNOSIS — J189 Pneumonia, unspecified organism: Secondary | ICD-10-CM | POA: Diagnosis not present

## 2018-05-15 DIAGNOSIS — Z79891 Long term (current) use of opiate analgesic: Secondary | ICD-10-CM

## 2018-05-15 DIAGNOSIS — G8929 Other chronic pain: Secondary | ICD-10-CM

## 2018-05-15 NOTE — Progress Notes (Signed)
SLEEP MEDICINE CLINIC   Provider:  Larey Seat, M D  Primary Care Physician:  Leanna Battles, MD   Referring Provider: Leanna Battles, MD    Virtual Visit via Video Note  I connected with [name] on 05/15/18 at  1:30 PM EDT by a video enabled telemedicine application and verified that I am speaking with the correct person using two identifiers. I discussed the limitations of evaluation and management by telemedicine and the availability of in person appointments. The patient expressed understanding and agreed to proceed.  History of Present Illness: [name] spent 5 days in Step down ICU, HSV 3 virus. He uses now an oxygen concentrator, but wakes up with Sp02 as low as 55%. He can get his oxygen level up by using 4 liters. He finally and very recently stopped smoking ( 04-13-2018) .  He still is on opiate pain medication, has seen a pain management physician. The pain MD wanted him to use a spinal pain stimulator.  Patient failed CPAP and BiPAP , had no benefit, dental device was not deemed helpful. He has a true phobia- he did not allow the tech to place a CPAP/ BiPAP interface. He now used oxygen by cannula and I am hopeful to have the patient use BIPAP auto- with a 4 cm water spread in pressure. Nasal pillow only tolerated- Bella swift with ear-loops to be used.       Observations/Objective: discussed last sleep studies    Assessment and Plan: in detail and his problems with tolerating any interface, He will need to use oxygen bled into auto-titration BiPAP.  [facility]- patient is deaf !!! Follow Up Instructions: RV in 3 month face to face, NP or MD.     I discussed the assessment and treatment plan with the patient. The patient was provided an opportunity to ask questions and all were answered. The patient agreed with the plan and demonstrated an understanding of the instructions.   The patient was advised to call back or seek an in-person evaluation if the symptoms  worsen or if the condition fails to improve as anticipated.  I provided 19 minutes of non-face-to-face time during this encounter.   Larey Seat, MD   05-15-2018   Chief Complaint  Patient presents with   New Patient (Initial Visit)    pt here today with wife, caucasian , 60 year old , right handed male in rm 59. pt wears bilateral hearing aids. His wife ( audiology specialist) states his days and nights are mixed up and states he snores slightly. This [name]  was on ambien and had parasomnia.     HPI:  [name] is a 63 y.o. male patient , seen here as in a referral from Dr. Philip Aspen for a sleep medicine evaluation,  [name] is a smoker, has a speech impediment,congenitial deafness, and presents with insomnia.  He took Ambien, was sleep walking under the influence of that drug and fell numerous times. He currently takes Doxepine 25 mg at night but this seems to provoke memory impairment- he could not recall his social security at check in, he begins sentences but doesn't finish them. Loses train of thought.    Chief complaint according to patient :" I no longer get up in the morning".   Sleep habits are as follows: Goes to bed between 4 and 6 AM- rises between 11 and 13 .00 hours. Eats , takes meds, and then he falls again until 2 Pm but sometimes until. dinnertime  around 5-6 PM. He sometimes takes his dinner medication and then is sleepy - with a second wind around 11 Pm.  Bedroom is cool, quiet and dark- there is a TV ,  She has noted him to snore he is severe hearing loss has not made him appreciate this. The couple joined a senior center last month, and hopes this will help to revert his circadian disorder. I will introduce the boot camp for insomnia patient.    Sleep medical history and family sleep history: Alan. Stevenson has a long list of medical diagnoses including chronic cigarette smoking, chronic insomnia with poor sleep hygiene, bilateral severe peripheral arterial  disease in both legs, diabetic and vascular related neuropathy with foot ulcers and poorly healing wounds- treated with hyperbaric oxygen -recurrent in 2017, major depression, atypical chest pain, chronic back pain, hyperlipidemia, retinopathy related to diabetes mellitus, hypogonadism, dry eyes and hypertension.  Foot pain.  Congenital hearing loss.  Social history: remarried, disabled since 2005-2008 , current smoker - 50 plus pack years, ETOH ; none, caffeine 1 cup of coffee in AM and diet coke through the day. Smokes cannabis.  Caffeine: rarely tea. 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 Chronic pain, PVD and diabetic NP and vascular pain   Review of Systems: Out of a complete 14 system review, the patient complains of only the following symptoms, and all other reviewed systems are negative.   Epworth score 16/ 24  , Fatigue severity score 58/63 , depression score PHq 9 - 9   Social History   Socioeconomic History   Marital status: Married    Spouse name: Not on file   Number of children: 2   Years of education: Not on file   Highest education level: Not on file  Occupational History   Occupation: disabled  Social Designer, fashion/clothing strain: Not on file   Food insecurity:    Worry: Not on file    Inability: Not on file   Transportation needs:    Medical: Not on file    Non-medical: Not on file  Tobacco Use   Smoking status: Former Smoker    Packs/day: 1.00    Years: 40.00    Pack years: 40.00    Types: Cigarettes    Last attempt to quit: 04/13/2018    Years since quitting: 0.0   Smokeless tobacco: Never Used  Substance and Sexual Activity   Alcohol use: No    Alcohol/week: 0.0 standard drinks   Drug use: No   Sexual activity: Not on file  Lifestyle   Physical activity:    Days per week: Not on file    Minutes per session: Not on file   Stress: Not on file  Relationships   Social connections:    Talks on phone: Not on file     Gets together: Not on file    Attends religious service: Not on file    Active member of club or organization: Not on file    Attends meetings of clubs or organizations: Not on file    Relationship status: Not on file   Intimate partner violence:    Fear of current or ex partner: Not on file    Emotionally abused: Not on file    Physically abused: Not on file    Forced sexual activity: Not on file  Other Topics Concern   Not on file  Social History Narrative   Not on file    Family History  Problem Relation Age of Onset   Prostate cancer Paternal Uncle    Prostate cancer Maternal Grandfather    Diabetes Mother    Hypertension Mother    Diabetes Father    Heart failure Father     Past Medical History:  Diagnosis Date   Anxiety    Arthritis    Chronic narcotic use    Complication of anesthesia    h/o aspiration during back surgery   Depression    Diabetic peripheral neuropathy ([place])    Hearing loss    wears bilateral hearing aids, bilat moderate SNHL   Hyperlipidemia    Hypertension    Insomnia    Tobacco abuse    Type 1 diabetes mellitus ([place])    Ulcer of left ankle (HCC)    Vision abnormalities    mild DM retinopathy    Past Surgical History:  Procedure Laterality Date   COCHLEAR IMPLANT Left 10/31/2017   Procedure: COCHLEAR IMPLANT LEFT EAR;  Surgeon: Vicie Mutters, MD;  Location: [facility];  Service: ENT;  Laterality: Left;   COLONOSCOPY WITH PROPOFOL  07-26-2010   [place] had 16 tons of steel rolled over his feet   LUMBAR LAMINECTOMY/DECOMPRESSION MICRODISCECTOMY  12-23-1997   LEFT  L5 -- S1   TONSILLECTOMY      Current Outpatient Medications  Medication Sig Dispense Refill   amLODipine (NORVASC) 5 MG tablet Take 5 mg by mouth daily.     aspirin EC 81 MG tablet Take 81 mg by mouth daily.     benzonatate (TESSALON) 100 MG capsule Take 100 mg by mouth 2 (two) times daily as needed for cough.        cefdinir (OMNICEF) 300 MG capsule Take 300 mg by mouth 2 (two) times daily.     divalproex (DEPAKOTE) 250 MG 24 hr tablet Take 250 mg by mouth daily.       doxepin (SINEQUAN) 25 MG capsule Take 25 mg by mouth at bedtime.     gabapentin (NEURONTIN) 300 MG capsule Take 300 mg by mouth 3 (three) times daily.      glipiZIDE (GLUCOTROL XL) 2.5 MG 24 hr tablet Take 2.5-5 mg by mouth See admin instructions. 5 mg in the am and 1 tablet dinner time     hydrochlorothiazide (HYDRODIURIL) 25 MG tablet Take 25 mg by mouth daily.     HYDROcodone-acetaminophen (NORCO/VICODIN) 5-325 MG tablet Take 1 tablet by mouth 3 (three) times daily as needed for moderate pain.      Insulin Glargine (BASAGLAR KWIKPEN) 100 UNIT/ML SOPN Inject 16 Units into the skin daily.      losartan (COZAAR) 100 MG tablet Take 100 mg by mouth daily.     metFORMIN (GLUCOPHAGE-XR) 500 MG 24 hr tablet Take 500 mg by mouth daily with breakfast.     methadone (DOLOPHINE) 5 MG tablet Take 5 mg by mouth 4 (four) times daily.      metoprolol tartrate (LOPRESSOR) 25 MG tablet Take 25-50 mg by mouth See admin instructions. 50 mg in the am and 25 mg qhs     nortriptyline (PAMELOR) 25 MG capsule Take 25 mg by mouth at bedtime.       sertraline (ZOLOFT) 50 MG tablet Take 50 mg by mouth daily.       simvastatin (ZOCOR) 20 MG tablet Take 20 mg by mouth at bedtime.       No current facility-administered medications for this visit.  Allergies as of 05/15/2018 - Review Complete 05/14/2018  Allergen Reaction Noted   Codeine Nausea And Vomiting 04/25/2010   Tetracyclines & related Hives 04/25/2010    Vitals: There were no vitals taken for this visit. Last Weight:  Wt Readings from Last 1 Encounters:  04/14/18 180 lb (81.6 kg)   JAS:NKNLZ is no height or weight on file to calculate BMI.     Last Height:   Ht Readings from Last 1 Encounters:  04/14/18 5\' 10"  (1.778 m)    Physical exam:  General: The patient is awake,  alert and appears not in acute distress. The patient is well groomed. Head: Normocephalic, atraumatic. Neck is supple. Mallampati 4,  neck circumference:16. Cardiovascular:  Regular rate and rhythm , without  murmurs or carotid bruit, and without distended neck veins. Respiratory: Lungs are clear to auscultation. Skin:  Without evidence of edema, or rash Trunk: BMI is 24. [name] posture is stooped.   Neurologic exam : The patient is awake and alert, oriented to place and time.     Attention span & concentration ability appears normal.  Speech is fluent,  with dysarthria, and nasal speech dysphonia - not aphasia.  Mood and affect are appropriate.  Cranial nerves: Pupils : right pupil more briskly reactive to light. Left mild ptosis.  Funduscopic exam differed- evidence of pallor or edema. Extraocular movements  in vertical and horizontal planes intact and without nystagmus. Visual fields by finger perimetry are intact. Hearing impaired  -Facial sensation intact to fine touch.  Facial motor strength is symmetric and tongue and uvula move midline. Shoulder shrug was symmetrical.   Motor exam:   Normal tone, muscle bulk and symmetric strength in upper extremities. His right leg has normal muscle tone and bulk, decreased dorsal pedal pulses. The left leg has significant muscle atrophy- small , loss of popliteal pulses, loss of sensation. Loss of vibration , delayed wound healing.   Sensory:  Fine touch, pinprick and vibration were tested in upper extremities as normal . Proprioception tested in the upper extremities was normal.  Coordination: Rapid alternating movements in the fingers/hands was normal. Finger-to-nose maneuver  normal without evidence of ataxia, dysmetria or tremor.  Gait and station: Patient walks without assistive device. Shuffling gait, tendency to retropulsion. Strength left leg weaker than right, serious balance issues.    Deep tendon reflexes: in the upper   extremities are symmetric and intact. Lower extremities with loss of DTR,  Babinski maneuver response is equivocal- attenuated. .   Reviewed images, hospitalization and labs, medications.    Assessment:  After physical and neurologic examination, review of laboratory studies,  Personal review of imaging studies, reports of other /same  Imaging studies, results of polysomnography and / or neurophysiology testing and pre-existing records as far as provided in visit., my assessment is   1) Circadian rhythm disorder, poor sleep habits and hygiene. Polypharmacy.   2) snoring - apnea has been not witnessed.   3) insomnia may be related to chronic pain, pulmonary disease, central and peripheral vascular disease,    The patient was advised of the nature of the diagnosed disorder , the treatment options and the  risks for general health and wellness arising from not treating the condition.   I spent more than 60 minutes of face to face time with the patient.  Greater than 50% of time was spent in counseling and coordination of care. We have discussed the diagnosis and differential and I answered the patient's questions.  Plan:  Treatment plan and additional workup : 1) circadian rhythm- The couple joined a senior center last month, and hopes this will help to revert his circadian disorder. I will introduce the boot camp for insomnia patient.  2) chronic pain insomnia. May explain  PLms , RLS.  3) Sleep study to rule out organic sleep disorder.   Larey Seat, MD 01/17/7508, 2:58 PM  Certified in Neurology by ABPN Certified in [place] by [facility] [address] [place], [place] [address]

## 2018-05-16 ENCOUNTER — Telehealth: Payer: Self-pay | Admitting: Neurology

## 2018-05-16 NOTE — Telephone Encounter (Signed)
Called the pt's wife to advise that Dr Brett Fairy discussed completing the auto [place]. Since the pt is on oxygen I have asked where the oxygen comes from. Wife states [facility]. Advised the pt wife I have sent the order to [facility] for the patient to get set up and scheduled follow up visit 07/24/18.  Reviewed compliance with patients wife.

## 2018-05-21 ENCOUNTER — Other Ambulatory Visit: Payer: Self-pay | Admitting: Neurology

## 2018-05-21 DIAGNOSIS — G4734 Idiopathic sleep related nonobstructive alveolar hypoventilation: Secondary | ICD-10-CM

## 2018-05-21 DIAGNOSIS — F1190 Opioid use, unspecified, uncomplicated: Secondary | ICD-10-CM

## 2018-05-21 DIAGNOSIS — F5103 Paradoxical insomnia: Secondary | ICD-10-CM

## 2018-05-21 DIAGNOSIS — G4733 Obstructive sleep apnea (adult) (pediatric): Secondary | ICD-10-CM

## 2018-05-21 DIAGNOSIS — G4737 Central sleep apnea in conditions classified elsewhere: Secondary | ICD-10-CM

## 2018-05-22 ENCOUNTER — Ambulatory Visit (INDEPENDENT_AMBULATORY_CARE_PROVIDER_SITE_OTHER): Payer: 59 | Admitting: Family

## 2018-05-22 ENCOUNTER — Other Ambulatory Visit: Payer: Self-pay

## 2018-05-22 ENCOUNTER — Other Ambulatory Visit: Payer: Self-pay | Admitting: Neurology

## 2018-05-22 ENCOUNTER — Encounter: Payer: Self-pay | Admitting: Family

## 2018-05-22 VITALS — Ht 70.0 in | Wt 180.0 lb

## 2018-05-22 DIAGNOSIS — G4733 Obstructive sleep apnea (adult) (pediatric): Secondary | ICD-10-CM

## 2018-05-22 DIAGNOSIS — L03115 Cellulitis of right lower limb: Secondary | ICD-10-CM | POA: Diagnosis not present

## 2018-05-22 DIAGNOSIS — G4737 Central sleep apnea in conditions classified elsewhere: Secondary | ICD-10-CM

## 2018-05-22 DIAGNOSIS — F1190 Opioid use, unspecified, uncomplicated: Secondary | ICD-10-CM

## 2018-05-22 DIAGNOSIS — G4734 Idiopathic sleep related nonobstructive alveolar hypoventilation: Secondary | ICD-10-CM

## 2018-05-22 MED ORDER — CEPHALEXIN 500 MG PO CAPS: 500.0000 mg | ORAL_CAPSULE | Freq: Three times a day (TID) | ORAL | 0 refills | Status: DC

## 2018-05-22 NOTE — Progress Notes (Signed)
Office Visit Note   Patient: [name]           Date of Birth: June 27, 1955           MRN: [id number] Visit Date: 05/22/2018              Requested by: Leanna Battles, [place], [place] [address] PCP: Leanna Battles, MD  Chief Complaint  Patient presents with  . Left Leg - Pain  . Right Leg - Pain      HPI: The patient is a 63 year old gentleman who presents today complaining of bilateral lower extremity edema and pain.  He has had difficulty getting to a standing position due to pain.  Right leg much more painful and swollen than the left.  Denies any injuries or insect bites.  No known infection no fever no chills.  He is diabetic.  Did have a history of a chronic ulcer to the left lower extremity which has now healed.  Complains of itching to the shins bilaterally.  Assessment & Plan: Visit Diagnoses:  1. Cellulitis of right lower limb     Plan: Will begin oral antibiotics encouraged him to resume his 5 compression stockings he will follow-up in office in 2 weeks should this continue to resolve.  If this shows any signs of worsening he will follow-up sooner.  Follow-Up Instructions: Return in about 2 weeks (around 06/05/2018).   Ortho Exam  Patient is alert, oriented, no adenopathy, well-dressed, normal affect, normal respiratory effort. On examination of bilateral lower extremities there is pitting edema.  2+ bilaterally right leg much more swollen than left.  There is hemosiderin staining bilaterally there is erythema and warmth to the right lower extremity there is a small 3 mm in diameter ulcer that is weeping with serous fluid.  There is no drainage or open areas to the left leg.  Imaging: No results found. No images are attached to the encounter.  Labs: Lab Results  Component Value Date   REPTSTATUS 04/16/2018 FINAL 04/16/2018   REPTSTATUS 04/19/2018 FINAL 04/16/2018   GRAMSTAIN NO WBC SEEN NO ORGANISMS SEEN  04/16/2018   CULT   04/16/2018    FEW Consistent with normal respiratory flora. Performed at [facility], [place] [address] [place], [place] [address]      Lab Results  Component Value Date   ALBUMIN 3.2 (L) 04/17/2018   ALBUMIN 3.1 (L) 04/16/2018   ALBUMIN 3.2 (L) 04/15/2018    Body mass index is 25.83 kg/m.  Orders:  No orders of the defined types were placed in this encounter.  Meds ordered this encounter  Medications  . cephALEXin (KEFLEX) 500 MG capsule    Sig: Take 1 capsule (500 mg total) by mouth 3 (three) times daily.    Dispense:  30 capsule    Refill:  0     Procedures: No procedures performed  Clinical Data: No additional findings.  ROS:  All other systems negative, except as noted in the HPI. Review of Systems  Constitutional: Negative for chills and fever.  Cardiovascular: Positive for leg swelling.  Skin: Positive for color change and wound.    Objective: Vital Signs: Ht 5\' 10"  (1.778 m)   Wt 180 lb (81.6 kg)   BMI 25.83 kg/m   Specialty Comments:  No specialty comments available.  PMFS History: Patient Active Problem List   Diagnosis Date Noted  . Acute respiratory failure with hypoxia and hypercapnia ([place]) 04/14/2018  . Diabetes mellitus type  1 with manifestations ([place]) 04/14/2018  . Sepsis ([place]) 04/14/2018  . Postop check 10/31/2017  . Severe central sleep apnea comorbid with prescribed opioid use 10/14/2017  . Insomnia secondary to chronic pain 08/01/2017  . Contact with and (suspected) exposure to environmental tobacco smoke (acute) (chronic) 08/01/2017  . Snoring 08/01/2017  . Venous insufficiency (chronic) (peripheral) 10/04/2016  . Essential hypertension 08/01/2013  . Chronic ulcer of left ankle ([place]) 08/01/2013  . Type I (juvenile type) diabetes mellitus with neurological manifestations, not stated as uncontrolled(250.61) 10/03/2012  . Healthcare-associated pneumonia 10/03/2012  . Dyslipidemia 10/03/2012  . Insomnia 10/03/2012  . Pain in  joint, lower leg 10/02/2012  . Special screening for malignant neoplasms, colon 07/26/2010   Past Medical History:  Diagnosis Date  . Anxiety   . Arthritis   . Chronic narcotic use   . Complication of anesthesia    h/o aspiration during back surgery  . Depression   . Diabetic peripheral neuropathy ([place])   . Hearing loss    wears bilateral hearing aids, bilat moderate SNHL  . Hyperlipidemia   . Hypertension   . Insomnia   . Tobacco abuse   . Type 1 diabetes mellitus ([place])   . Ulcer of left ankle ([place])   . Vision abnormalities    mild DM retinopathy    Family History  Problem Relation Age of Onset  . Prostate cancer Paternal Uncle   . Prostate cancer Maternal Grandfather   . Diabetes Mother   . Hypertension Mother   . Diabetes Father   . Heart failure Father     Past Surgical History:  Procedure Laterality Date  . COCHLEAR IMPLANT Left 10/31/2017   Procedure: COCHLEAR IMPLANT LEFT EAR;  Surgeon: Vicie Mutters, MD;  Location: [facility];  Service: ENT;  Laterality: Left;  . COLONOSCOPY WITH PROPOFOL  07-26-2010  . [place] had 16 tons of steel rolled over his feet  . LUMBAR LAMINECTOMY/DECOMPRESSION MICRODISCECTOMY  12-23-1997   LEFT  L5 -- S1  . TONSILLECTOMY     Social History   Occupational History  . Occupation: disabled  Tobacco Use  . Smoking status: Former Smoker    Packs/day: 1.00    Years: 40.00    Pack years: 40.00    Types: Cigarettes    Last attempt to quit: 04/13/2018    Years since quitting: 0.1  . Smokeless tobacco: Never Used  Substance and Sexual Activity  . Alcohol use: No    Alcohol/week: 0.0 standard drinks  . Drug use: No  . Sexual activity: Not on file

## 2018-05-27 DIAGNOSIS — N183 Chronic kidney disease, stage 3 (moderate): Secondary | ICD-10-CM | POA: Diagnosis not present

## 2018-05-27 DIAGNOSIS — J811 Chronic pulmonary edema: Secondary | ICD-10-CM | POA: Diagnosis not present

## 2018-05-27 DIAGNOSIS — J9811 Atelectasis: Secondary | ICD-10-CM | POA: Diagnosis not present

## 2018-05-27 DIAGNOSIS — I129 Hypertensive chronic kidney disease with stage 1 through stage 4 chronic kidney disease, or unspecified chronic kidney disease: Secondary | ICD-10-CM | POA: Diagnosis not present

## 2018-05-27 DIAGNOSIS — J90 Pleural effusion, not elsewhere classified: Secondary | ICD-10-CM | POA: Diagnosis not present

## 2018-05-27 DIAGNOSIS — R600 Localized edema: Secondary | ICD-10-CM | POA: Diagnosis not present

## 2018-05-27 DIAGNOSIS — R0609 Other forms of dyspnea: Secondary | ICD-10-CM | POA: Diagnosis not present

## 2018-05-31 ENCOUNTER — Inpatient Hospital Stay (HOSPITAL_COMMUNITY)
Admission: EM | Admit: 2018-05-31 | Discharge: 2018-06-03 | DRG: 603 | Disposition: A | Discharge status: Home / Self Care | Payer: 59 | Attending: Internal Medicine | Admitting: Internal Medicine

## 2018-05-31 ENCOUNTER — Other Ambulatory Visit: Payer: Self-pay

## 2018-05-31 ENCOUNTER — Ambulatory Visit (INDEPENDENT_AMBULATORY_CARE_PROVIDER_SITE_OTHER): Payer: 59 | Admitting: Physician Assistant

## 2018-05-31 ENCOUNTER — Encounter: Payer: Self-pay | Admitting: Physician Assistant

## 2018-05-31 ENCOUNTER — Encounter (HOSPITAL_COMMUNITY): Payer: Self-pay | Admitting: Emergency Medicine

## 2018-05-31 VITALS — Ht 70.0 in | Wt 180.0 lb

## 2018-05-31 DIAGNOSIS — L03119 Cellulitis of unspecified part of limb: Secondary | ICD-10-CM | POA: Diagnosis present

## 2018-05-31 DIAGNOSIS — G8929 Other chronic pain: Secondary | ICD-10-CM | POA: Diagnosis present

## 2018-05-31 DIAGNOSIS — I872 Venous insufficiency (chronic) (peripheral): Secondary | ICD-10-CM

## 2018-05-31 DIAGNOSIS — L03116 Cellulitis of left lower limb: Secondary | ICD-10-CM | POA: Diagnosis present

## 2018-05-31 DIAGNOSIS — Z87891 Personal history of nicotine dependence: Secondary | ICD-10-CM

## 2018-05-31 DIAGNOSIS — Z794 Long term (current) use of insulin: Secondary | ICD-10-CM

## 2018-05-31 DIAGNOSIS — L03115 Cellulitis of right lower limb: Secondary | ICD-10-CM | POA: Diagnosis not present

## 2018-05-31 DIAGNOSIS — E108 Type 1 diabetes mellitus with unspecified complications: Secondary | ICD-10-CM

## 2018-05-31 DIAGNOSIS — G4731 Primary central sleep apnea: Secondary | ICD-10-CM | POA: Diagnosis present

## 2018-05-31 DIAGNOSIS — F1120 Opioid dependence, uncomplicated: Secondary | ICD-10-CM | POA: Diagnosis present

## 2018-05-31 DIAGNOSIS — Z881 Allergy status to other antibiotic agents status: Secondary | ICD-10-CM

## 2018-05-31 DIAGNOSIS — D509 Iron deficiency anemia, unspecified: Secondary | ICD-10-CM | POA: Diagnosis present

## 2018-05-31 DIAGNOSIS — Z974 Presence of external hearing-aid: Secondary | ICD-10-CM

## 2018-05-31 DIAGNOSIS — J9611 Chronic respiratory failure with hypoxia: Secondary | ICD-10-CM | POA: Diagnosis present

## 2018-05-31 DIAGNOSIS — N183 Chronic kidney disease, stage 3 (moderate): Secondary | ICD-10-CM | POA: Diagnosis not present

## 2018-05-31 DIAGNOSIS — I129 Hypertensive chronic kidney disease with stage 1 through stage 4 chronic kidney disease, or unspecified chronic kidney disease: Secondary | ICD-10-CM | POA: Diagnosis present

## 2018-05-31 DIAGNOSIS — E10622 Type 1 diabetes mellitus with other skin ulcer: Secondary | ICD-10-CM | POA: Diagnosis present

## 2018-05-31 DIAGNOSIS — Z9981 Dependence on supplemental oxygen: Secondary | ICD-10-CM

## 2018-05-31 DIAGNOSIS — Z20828 Contact with and (suspected) exposure to other viral communicable diseases: Secondary | ICD-10-CM | POA: Diagnosis present

## 2018-05-31 DIAGNOSIS — E1022 Type 1 diabetes mellitus with diabetic chronic kidney disease: Secondary | ICD-10-CM | POA: Diagnosis present

## 2018-05-31 DIAGNOSIS — F1190 Opioid use, unspecified, uncomplicated: Secondary | ICD-10-CM

## 2018-05-31 DIAGNOSIS — E10319 Type 1 diabetes mellitus with unspecified diabetic retinopathy without macular edema: Secondary | ICD-10-CM | POA: Diagnosis present

## 2018-05-31 DIAGNOSIS — E785 Hyperlipidemia, unspecified: Secondary | ICD-10-CM | POA: Diagnosis present

## 2018-05-31 DIAGNOSIS — J449 Chronic obstructive pulmonary disease, unspecified: Secondary | ICD-10-CM | POA: Diagnosis present

## 2018-05-31 DIAGNOSIS — N179 Acute kidney failure, unspecified: Secondary | ICD-10-CM | POA: Diagnosis present

## 2018-05-31 DIAGNOSIS — L97329 Non-pressure chronic ulcer of left ankle with unspecified severity: Secondary | ICD-10-CM | POA: Diagnosis present

## 2018-05-31 DIAGNOSIS — I10 Essential (primary) hypertension: Secondary | ICD-10-CM

## 2018-05-31 DIAGNOSIS — Z9621 Cochlear implant status: Secondary | ICD-10-CM | POA: Diagnosis present

## 2018-05-31 DIAGNOSIS — Z79891 Long term (current) use of opiate analgesic: Secondary | ICD-10-CM

## 2018-05-31 DIAGNOSIS — F418 Other specified anxiety disorders: Secondary | ICD-10-CM | POA: Diagnosis present

## 2018-05-31 DIAGNOSIS — H903 Sensorineural hearing loss, bilateral: Secondary | ICD-10-CM | POA: Diagnosis present

## 2018-05-31 DIAGNOSIS — Z833 Family history of diabetes mellitus: Secondary | ICD-10-CM

## 2018-05-31 DIAGNOSIS — R001 Bradycardia, unspecified: Secondary | ICD-10-CM | POA: Diagnosis not present

## 2018-05-31 DIAGNOSIS — Z8042 Family history of malignant neoplasm of prostate: Secondary | ICD-10-CM

## 2018-05-31 DIAGNOSIS — G4700 Insomnia, unspecified: Secondary | ICD-10-CM | POA: Diagnosis present

## 2018-05-31 DIAGNOSIS — Z7982 Long term (current) use of aspirin: Secondary | ICD-10-CM

## 2018-05-31 DIAGNOSIS — E1042 Type 1 diabetes mellitus with diabetic polyneuropathy: Secondary | ICD-10-CM | POA: Diagnosis present

## 2018-05-31 DIAGNOSIS — G894 Chronic pain syndrome: Secondary | ICD-10-CM | POA: Diagnosis present

## 2018-05-31 DIAGNOSIS — Z79899 Other long term (current) drug therapy: Secondary | ICD-10-CM

## 2018-05-31 DIAGNOSIS — Z885 Allergy status to narcotic agent status: Secondary | ICD-10-CM

## 2018-05-31 DIAGNOSIS — N1832 Chronic kidney disease, stage 3b: Secondary | ICD-10-CM | POA: Diagnosis present

## 2018-05-31 DIAGNOSIS — G4737 Central sleep apnea in conditions classified elsewhere: Secondary | ICD-10-CM

## 2018-05-31 DIAGNOSIS — Z8249 Family history of ischemic heart disease and other diseases of the circulatory system: Secondary | ICD-10-CM

## 2018-05-31 DIAGNOSIS — I878 Other specified disorders of veins: Secondary | ICD-10-CM | POA: Diagnosis present

## 2018-05-31 LAB — CBC WITH DIFFERENTIAL/PLATELET
Abs Immature Granulocytes: 0.04 10*3/uL (ref 0.00–0.07)
Basophils Absolute: 0 10*3/uL (ref 0.0–0.1)
Basophils Relative: 0 %
Eosinophils Absolute: 0.2 10*3/uL (ref 0.0–0.5)
Eosinophils Relative: 3 %
HCT: 31.9 % — ABNORMAL LOW (ref 39.0–52.0)
Hemoglobin: 10.2 g/dL — ABNORMAL LOW (ref 13.0–17.0)
Immature Granulocytes: 1 %
Lymphocytes Relative: 29 %
Lymphs Abs: 1.5 10*3/uL (ref 0.7–4.0)
MCH: 29.2 pg (ref 26.0–34.0)
MCHC: 32 g/dL (ref 30.0–36.0)
MCV: 91.4 fL (ref 80.0–100.0)
Monocytes Absolute: 0.4 10*3/uL (ref 0.1–1.0)
Monocytes Relative: 8 %
Neutro Abs: 3 10*3/uL (ref 1.7–7.7)
Neutrophils Relative %: 59 %
Platelets: 198 10*3/uL (ref 150–400)
RBC: 3.49 MIL/uL — ABNORMAL LOW (ref 4.22–5.81)
RDW: 14.7 % (ref 11.5–15.5)
WBC: 5.1 10*3/uL (ref 4.0–10.5)
nRBC: 0 % (ref 0.0–0.2)

## 2018-05-31 LAB — SEDIMENTATION RATE: Sed Rate: 18 mm/hr — ABNORMAL HIGH (ref 0–16)

## 2018-05-31 LAB — COMPREHENSIVE METABOLIC PANEL
ALT: 10 U/L (ref 0–44)
AST: 14 U/L — ABNORMAL LOW (ref 15–41)
Albumin: 3.8 g/dL (ref 3.5–5.0)
Alkaline Phosphatase: 86 U/L (ref 38–126)
Anion gap: 11 (ref 5–15)
BUN: 20 mg/dL (ref 8–23)
CO2: 27 mmol/L (ref 22–32)
Calcium: 9.3 mg/dL (ref 8.9–10.3)
Chloride: 100 mmol/L (ref 98–111)
Creatinine, Ser: 1.31 mg/dL — ABNORMAL HIGH (ref 0.61–1.24)
GFR calc Af Amer: >60 mL/min (ref >60)
GFR calc non Af Amer: 57 mL/min — ABNORMAL LOW (ref >60)
Glucose, Bld: 156 mg/dL — ABNORMAL HIGH (ref 70–99)
Potassium: 4.6 mmol/L (ref 3.5–5.1)
Sodium: 138 mmol/L (ref 135–145)
Total Bilirubin: 0.6 mg/dL (ref 0.3–1.2)
Total Protein: 7.3 g/dL (ref 6.5–8.1)

## 2018-05-31 LAB — GLUCOSE, CAPILLARY: Glucose-Capillary: 84 mg/dL (ref 70–99)

## 2018-05-31 LAB — C-REACTIVE PROTEIN: CRP: 2.8 mg/dL — ABNORMAL HIGH (ref <1.0)

## 2018-05-31 LAB — LACTIC ACID, PLASMA: Lactic Acid, Venous: 0.8 mmol/L (ref 0.5–1.9)

## 2018-05-31 LAB — SARS CORONAVIRUS 2 BY RT PCR (HOSPITAL ORDER, PERFORMED IN ~~LOC~~ HOSPITAL LAB): SARS Coronavirus 2: NEGATIVE

## 2018-05-31 LAB — PROCALCITONIN: Procalcitonin: <0.1 ng/mL

## 2018-05-31 MED ORDER — SODIUM CHLORIDE 0.9 % IV SOLN
1.0000 g | INTRAVENOUS | Status: DC
  Administered 2018-05-31 – 2018-06-02 (×3): 1 g via INTRAVENOUS
  Filled 2018-05-31 (×4): qty 10

## 2018-05-31 MED ORDER — ASPIRIN EC 81 MG PO TBEC
81.0000 mg | DELAYED_RELEASE_TABLET | Freq: Every day | ORAL | Status: DC
  Administered 2018-06-01 – 2018-06-03 (×3): 81 mg via ORAL
  Filled 2018-05-31 (×3): qty 1

## 2018-05-31 MED ORDER — ACETAMINOPHEN 325 MG PO TABS
650.0000 mg | ORAL_TABLET | Freq: Four times a day (QID) | ORAL | Status: DC | PRN
  Administered 2018-06-01: 650 mg via ORAL
  Filled 2018-05-31: qty 2

## 2018-05-31 MED ORDER — ACETAMINOPHEN 650 MG RE SUPP: 650.0000 mg | Freq: Four times a day (QID) | RECTAL | Status: DC | PRN

## 2018-05-31 MED ORDER — DM-GUAIFENESIN ER 30-600 MG PO TB12: 1.0000 | ORAL_TABLET | Freq: Two times a day (BID) | ORAL | Status: DC | PRN

## 2018-05-31 MED ORDER — VANCOMYCIN HCL 10 G IV SOLR
1500.0000 mg | Freq: Once | INTRAVENOUS | Status: AC
  Administered 2018-05-31: 1500 mg via INTRAVENOUS
  Filled 2018-05-31: qty 1500

## 2018-05-31 MED ORDER — AMLODIPINE BESYLATE 5 MG PO TABS
5.0000 mg | ORAL_TABLET | Freq: Every day | ORAL | Status: DC
  Administered 2018-06-01 – 2018-06-03 (×3): 5 mg via ORAL
  Filled 2018-05-31 (×3): qty 1

## 2018-05-31 MED ORDER — HYDRALAZINE HCL 20 MG/ML IJ SOLN: 5.0000 mg | INTRAMUSCULAR | Status: DC | PRN

## 2018-05-31 MED ORDER — INSULIN GLARGINE 100 UNIT/ML ~~LOC~~ SOLN
8.0000 [IU] | Freq: Every day | SUBCUTANEOUS | Status: DC
  Administered 2018-06-01 – 2018-06-03 (×3): 8 [IU] via SUBCUTANEOUS
  Filled 2018-05-31 (×3): qty 0.08

## 2018-05-31 MED ORDER — IPRATROPIUM BROMIDE HFA 17 MCG/ACT IN AERS: 2.0000 | INHALATION_SPRAY | RESPIRATORY_TRACT | Status: DC

## 2018-05-31 MED ORDER — DIVALPROEX SODIUM 250 MG PO DR TAB
250.0000 mg | DELAYED_RELEASE_TABLET | Freq: Every day | ORAL | Status: DC
  Administered 2018-06-01 – 2018-06-03 (×3): 250 mg via ORAL
  Filled 2018-05-31 (×3): qty 1

## 2018-05-31 MED ORDER — METOPROLOL TARTRATE 50 MG PO TABS
50.0000 mg | ORAL_TABLET | Freq: Every day | ORAL | Status: DC
  Administered 2018-06-01 – 2018-06-03 (×3): 50 mg via ORAL
  Filled 2018-05-31 (×3): qty 1

## 2018-05-31 MED ORDER — SERTRALINE HCL 50 MG PO TABS
50.0000 mg | ORAL_TABLET | Freq: Every day | ORAL | Status: DC
  Administered 2018-06-01 – 2018-06-02 (×3): 50 mg via ORAL
  Filled 2018-05-31 (×3): qty 1

## 2018-05-31 MED ORDER — CLINDAMYCIN PHOSPHATE 600 MG/50ML IV SOLN
600.0000 mg | Freq: Once | INTRAVENOUS | Status: AC
  Administered 2018-05-31: 600 mg via INTRAVENOUS
  Filled 2018-05-31: qty 50

## 2018-05-31 MED ORDER — ONDANSETRON HCL 4 MG/2ML IJ SOLN: 4.0000 mg | Freq: Four times a day (QID) | INTRAMUSCULAR | Status: DC | PRN

## 2018-05-31 MED ORDER — NORTRIPTYLINE HCL 25 MG PO CAPS
25.0000 mg | ORAL_CAPSULE | Freq: Every day | ORAL | Status: DC
  Administered 2018-06-01 – 2018-06-02 (×3): 25 mg via ORAL
  Filled 2018-05-31 (×3): qty 1

## 2018-05-31 MED ORDER — SIMVASTATIN 20 MG PO TABS
20.0000 mg | ORAL_TABLET | Freq: Every day | ORAL | Status: DC
  Administered 2018-06-01 – 2018-06-02 (×3): 20 mg via ORAL
  Filled 2018-05-31 (×3): qty 1

## 2018-05-31 MED ORDER — GABAPENTIN 300 MG PO CAPS
300.0000 mg | ORAL_CAPSULE | Freq: Two times a day (BID) | ORAL | Status: DC
  Administered 2018-06-01 – 2018-06-03 (×6): 300 mg via ORAL
  Filled 2018-05-31 (×6): qty 1

## 2018-05-31 MED ORDER — DOXEPIN HCL 25 MG PO CAPS
25.0000 mg | ORAL_CAPSULE | Freq: Every day | ORAL | Status: DC
  Administered 2018-06-01 – 2018-06-02 (×3): 25 mg via ORAL
  Filled 2018-05-31 (×3): qty 1

## 2018-05-31 MED ORDER — INSULIN ASPART 100 UNIT/ML ~~LOC~~ SOLN
0.0000 [IU] | Freq: Three times a day (TID) | SUBCUTANEOUS | Status: DC
  Administered 2018-06-02 (×2): 1 [IU] via SUBCUTANEOUS

## 2018-05-31 MED ORDER — METOPROLOL TARTRATE 25 MG PO TABS
25.0000 mg | ORAL_TABLET | Freq: Every day | ORAL | Status: DC
  Administered 2018-06-01 – 2018-06-02 (×3): 25 mg via ORAL
  Filled 2018-05-31 (×3): qty 1

## 2018-05-31 MED ORDER — HYDROCODONE-ACETAMINOPHEN 5-325 MG PO TABS
2.0000 | ORAL_TABLET | Freq: Every day | ORAL | Status: DC
  Administered 2018-06-01: 2 via ORAL
  Filled 2018-05-31: qty 2

## 2018-05-31 MED ORDER — SODIUM CHLORIDE 0.9 % IV BOLUS
500.0000 mL | Freq: Once | INTRAVENOUS | Status: AC
  Administered 2018-05-31: 500 mL via INTRAVENOUS

## 2018-05-31 MED ORDER — METHADONE HCL 10 MG PO TABS
10.0000 mg | ORAL_TABLET | Freq: Two times a day (BID) | ORAL | Status: DC
  Administered 2018-06-01: 10 mg via ORAL
  Filled 2018-05-31 (×2): qty 1

## 2018-05-31 MED ORDER — SENNOSIDES-DOCUSATE SODIUM 8.6-50 MG PO TABS: 1.0000 | ORAL_TABLET | Freq: Every evening | ORAL | Status: DC | PRN

## 2018-05-31 MED ORDER — ENOXAPARIN SODIUM 40 MG/0.4ML ~~LOC~~ SOLN
40.0000 mg | SUBCUTANEOUS | Status: DC
  Administered 2018-06-01 – 2018-06-02 (×3): 40 mg via SUBCUTANEOUS
  Filled 2018-05-31 (×3): qty 0.4

## 2018-05-31 MED ORDER — IPRATROPIUM BROMIDE HFA 17 MCG/ACT IN AERS
2.0000 | INHALATION_SPRAY | Freq: Two times a day (BID) | RESPIRATORY_TRACT | Status: DC
  Administered 2018-06-01 – 2018-06-03 (×6): 2 via RESPIRATORY_TRACT
  Filled 2018-05-31: qty 12.9

## 2018-05-31 MED ORDER — ONDANSETRON HCL 4 MG PO TABS: 4.0000 mg | ORAL_TABLET | Freq: Four times a day (QID) | ORAL | Status: DC | PRN

## 2018-05-31 MED ORDER — ALBUTEROL SULFATE HFA 108 (90 BASE) MCG/ACT IN AERS
2.0000 | INHALATION_SPRAY | RESPIRATORY_TRACT | Status: DC | PRN
  Filled 2018-05-31: qty 6.7

## 2018-05-31 MED ORDER — ACETAMINOPHEN 325 MG PO TABS
650.0000 mg | ORAL_TABLET | Freq: Once | ORAL | Status: AC
  Administered 2018-05-31: 650 mg via ORAL
  Filled 2018-05-31: qty 2

## 2018-05-31 NOTE — Progress Notes (Signed)
Pharmacy Antibiotic Note  [name] is a 63 y.o. male admitted on 05/31/2018 with cellulitis. Pharmacy has been consulted for vancomycin dosing. Pt with low-grade fever of 100.6 and WBC is WNL. SCr is slightly elevated but appears to be patients baseline. Pt was on cephalexin PTA.   Plan: Vancomycin 1500mg  IV Q24H F/u renal fxn, C&S, clinical status and peak/trough at SS  Height: 5\' 10"  (177.8 cm) Weight: 173 lb (78.5 kg) IBW/kg (Calculated) : 73  Temp (24hrs), Avg:99.4 F (37.4 C), Min:98.2 F (36.8 C), Max:100.6 F (38.1 C)  Recent Labs  Lab 05/31/18 1918  WBC 5.1  CREATININE 1.31*    Estimated Creatinine Clearance: 58.8 mL/min (A) (by C-G formula based on SCr of 1.31 mg/dL (H)).    Allergies  Allergen Reactions  . Codeine Nausea And Vomiting  . Tetracyclines & Related Hives    Antimicrobials this admission: Vanc 5/15>> CTX 5/15>> Clinda x 1 5/15  Dose adjustments this admission: N/A  Microbiology results: Pending  Thank you for allowing pharmacy to be a part of this patient's care.  Jillian Warth, Rande Lawman 05/31/2018 9:34 PM
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
## 2018-05-31 NOTE — ED Triage Notes (Addendum)
Pt to ED with c/o bil lower leg redness and swelling.  Pt st's he is currently on antibiotics.  Pt is on continuous 02 at home at 3 LPM via [place]

## 2018-05-31 NOTE — ED Notes (Signed)
Pt requesting to eat in waiting room. Advised pt he was NPO until seen by a provider.
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
## 2018-05-31 NOTE — H&P (Addendum)
History and Physical    [name] LXB:[id number] DOB: 10-Feb-1955 DOA: 05/31/2018  Referring MD/NP/PA:   PCP: Leanna Battles, MD   Patient coming from:  The patient is coming from home.  At baseline, pt is independent for most of ADL.        Chief Complaint: Bilateral lower leg pain  HPI: [name] is a 63 y.o. male with medical history significant of hypertension, hyperlipidemia, type 1 diabetes, chronic respiratory failure possibly due to COPD on 3 L oxygen at home, depression with anxiety, chronic pain syndrome on chronic narcotic of methadone, hard of hearing, insomnia, OSA on CPAP, CKD-3, who presents with bilateral lower leg pain.  Patient has bilateral lower leg cellulitis for more than 2 weeks.  Patient has been following up with orthopedic surgeon, and currently is taking doxycycline until tomorrow per report.  Patient states that he has been taking antibiotics consistently, but his leg pain has worsened.  Patient states that his pain in both legs is constant, 8-10 out of 10 in severity, sharp, nonradiating.  The swelling and redness have also worsened.  His orthopedic surgeon sent him to ED due to concerning for cellulitis and also failure of oral antibiotic treatment as outpatient. Patient has mild chronic shortness breath and cough, which is at baseline.  No chest pain.  Patient has mild fever and chills.  Denies nausea, vomiting, diarrhea, abdominal pain, symptoms of UTI or unilateral weakness.  ED Course: pt was found to have WBC 5.1, stable renal function, negative COVID-19 test, temperature 100.6, bradycardia, oxygen saturation oxygen 99 on 2-3 L o2, RR 20.  Patient is placed MedSurg bed for observation.  Review of Systems:   General: has fevers, chills, no body weight gain, has fatigue HEENT: no blurry vision, sore throat. Has HOH. Respiratory: has dyspnea, coughing, no wheezing CV: no chest pain, no palpitations GI: no nausea, vomiting, abdominal pain, diarrhea,  constipation GU: no dysuria, burning on urination, increased urinary frequency, hematuria  Ext: has leg edema Neuro: no unilateral weakness, numbness, or tingling, no vision change or hearing loss Skin: no rash, no skin tear. Has swelling, pain and erythema in both legs (right leg is worse than the left) MSK: No muscle spasm, no deformity, no limitation of range of movement in spin Heme: No easy bruising.  Travel history: No recent long distant travel.  Allergy:  Allergies  Allergen Reactions  . Codeine Nausea And Vomiting  . Tetracyclines & Related Hives    Past Medical History:  Diagnosis Date  . Anxiety   . Arthritis   . Chronic narcotic use   . Complication of anesthesia    h/o aspiration during back surgery  . Depression   . Diabetic peripheral neuropathy ([place])   . Hearing loss    wears bilateral hearing aids, bilat moderate SNHL  . Hyperlipidemia   . Hypertension   . Insomnia   . Tobacco abuse   . Type 1 diabetes mellitus ([place])   . Ulcer of left ankle ([place])   . Vision abnormalities    mild DM retinopathy    Past Surgical History:  Procedure Laterality Date  . COCHLEAR IMPLANT Left 10/31/2017   Procedure: COCHLEAR IMPLANT LEFT EAR;  Surgeon: Vicie Mutters, MD;  Location: [facility];  Service: ENT;  Laterality: Left;  . COLONOSCOPY WITH PROPOFOL  07-26-2010  . [place] had 16 tons of steel rolled over his feet  . LUMBAR LAMINECTOMY/DECOMPRESSION MICRODISCECTOMY  12-23-1997  LEFT  L5 -- S1  . TONSILLECTOMY      Social History:  reports that he quit smoking about 6 weeks ago. His smoking use included cigarettes. He has a 40.00 pack-year smoking history. He has never used smokeless tobacco. He reports that he does not drink alcohol or use drugs.  Family History:  Family History  Problem Relation Age of Onset  . Prostate cancer Paternal Uncle   . Prostate cancer Maternal Grandfather   . Diabetes Mother   . Hypertension Mother   .  Diabetes Father   . Heart failure Father      Prior to Admission medications   Medication Sig Start Date End Date Taking? Authorizing Provider  amLODipine (NORVASC) 5 MG tablet Take 5 mg by mouth daily.    [provider]  aspirin EC 81 MG tablet Take 81 mg by mouth daily.    [provider]  benzonatate (TESSALON) 100 MG capsule Take 100 mg by mouth 2 (two) times daily as needed for cough.  06/24/13   [provider]  cefdinir (OMNICEF) 300 MG capsule Take 300 mg by mouth 2 (two) times daily. 04/08/18   [provider]  cephALEXin (KEFLEX) 500 MG capsule Take 1 capsule (500 mg total) by mouth 3 (three) times daily. 05/22/18   Suzan Slick, NP  divalproex (DEPAKOTE) 250 MG 24 hr tablet Take 250 mg by mouth daily.      [provider]  doxepin (SINEQUAN) 25 MG capsule Take 25 mg by mouth at bedtime.    [provider]  gabapentin (NEURONTIN) 300 MG capsule Take 300 mg by mouth 3 (three) times daily.     [provider]  glipiZIDE (GLUCOTROL XL) 2.5 MG 24 hr tablet Take 2.5-5 mg by mouth See admin instructions. 5 mg in the am and 1 tablet dinner time    [provider]  hydrochlorothiazide (HYDRODIURIL) 25 MG tablet Take 25 mg by mouth daily.    [provider]  HYDROcodone-acetaminophen (NORCO/VICODIN) 5-325 MG tablet Take 1 tablet by mouth 3 (three) times daily as needed for moderate pain.     [provider]  Insulin Glargine (BASAGLAR KWIKPEN) 100 UNIT/ML SOPN Inject 16 Units into the skin daily.     [provider]  losartan (COZAAR) 100 MG tablet Take 100 mg by mouth daily.    [provider]  metFORMIN (GLUCOPHAGE-XR) 500 MG 24 hr tablet Take 500 mg by mouth daily with breakfast.    [provider]  methadone (DOLOPHINE) 5 MG tablet Take 5 mg by mouth 4 (four) times daily.     [provider]  metoprolol tartrate (LOPRESSOR) 25 MG tablet Take 25-50 mg by mouth See admin  instructions. 50 mg in the am and 25 mg qhs    [provider]  nortriptyline (PAMELOR) 25 MG capsule Take 25 mg by mouth at bedtime.      [provider]  sertraline (ZOLOFT) 50 MG tablet Take 50 mg by mouth daily.      [provider]  simvastatin (ZOCOR) 20 MG tablet Take 20 mg by mouth at bedtime.      [provider]    Physical Exam: Vitals:   05/31/18 2036 05/31/18 2036 05/31/18 2045 05/31/18 2115  BP:   126/66   Pulse: (!) 58  (!) 54 (!) 57  Resp:      Temp:      TempSrc:      SpO2: 100%  100% 100%  Weight:  78.5 kg    Height:  '5\' 10"'$  (1.778 m)     General: Not in acute distress HEENT:       Eyes: PERRL, EOMI, no scleral icterus.       ENT: No discharge from the ears and nose, no pharynx injection, no tonsillar enlargement.        Neck: No JVD, no bruit, no mass felt. Heme: No neck lymph node enlargement. Cardiac: S1/S2, RRR, No murmurs, No gallops or rubs. Respiratory: No rales, wheezing, rhonchi or rubs. GI: Soft, nondistended, nontender, no rebound pain, no organomegaly, BS present. GU: No hematuria Ext: 2+DP/PT pulse bilaterally. Has swelling, tenderness, warmth and erythema in both legs (right leg is worse than the left).  Patient has chronic venous insufficiency in both legs. Musculoskeletal: No joint deformities, No joint redness or warmth, no limitation of ROM in spin. Skin: No rashes.  Neuro: Alert, oriented X3, cranial nerves II-XII grossly intact, moves all extremities normally.  Psych: Patient is not psychotic, no suicidal or hemocidal ideation.  Labs on Admission: I have personally reviewed following labs and imaging studies  CBC: Recent Labs  Lab 05/31/18 1918  WBC 5.1  NEUTROABS 3.0  HGB 10.2*  HCT 31.9*  MCV 91.4  PLT 643   Basic Metabolic Panel: Recent Labs  Lab 05/31/18 1918  NA 138  K 4.6  CL 100  CO2 27  GLUCOSE 156*  BUN 20  CREATININE 1.31*  CALCIUM 9.3   GFR: Estimated Creatinine Clearance:  58.8 mL/min (A) (by C-G formula based on SCr of 1.31 mg/dL (H)). Liver Function Tests: Recent Labs  Lab 05/31/18 1918  AST 14*  ALT 10  ALKPHOS 86  BILITOT 0.6  PROT 7.3  ALBUMIN 3.8   No results for input(s): LIPASE, AMYLASE in the last 168 hours. No results for input(s): AMMONIA in the last 168 hours. Coagulation Profile: No results for input(s): INR, PROTIME in the last 168 hours. Cardiac Enzymes: No results for input(s): CKTOTAL, CKMB, CKMBINDEX, TROPONINI in the last 168 hours. BNP (last 3 results) No results for input(s): PROBNP in the last 8760 hours. HbA1C: No results for input(s): HGBA1C in the last 72 hours. CBG: No results for input(s): GLUCAP in the last 168 hours. Lipid Profile: No results for input(s): CHOL, HDL, LDLCALC, TRIG, CHOLHDL, LDLDIRECT in the last 72 hours. Thyroid Function Tests: No results for input(s): TSH, T4TOTAL, FREET4, T3FREE, THYROIDAB in the last 72 hours. Anemia Panel: No results for input(s): VITAMINB12, FOLATE, FERRITIN, TIBC, IRON, RETICCTPCT in the last 72 hours. Urine analysis:    Component Value Date/Time   COLORURINE YELLOW 04/14/2018 2114   APPEARANCEUR CLEAR 04/14/2018 2114   LABSPEC 1.015 04/14/2018 2114   PHURINE 6.0 04/14/2018 2114   GLUCOSEU NEGATIVE 04/14/2018 2114   HGBUR NEGATIVE 04/14/2018 2114   BILIRUBINUR NEGATIVE 04/14/2018 2114   KETONESUR 5 (A) 04/14/2018 2114   PROTEINUR >=300 (A) 04/14/2018 2114   NITRITE NEGATIVE 04/14/2018 2114   LEUKOCYTESUR NEGATIVE 04/14/2018 2114   Sepsis Labs: '@LABRCNTIP'$ (procalcitonin:4,lacticidven:4) )No results found for this or any previous visit (from the past 240 hour(s)).   Radiological Exams on Admission: No results found.   EKG: Reviewed independently.  Sinus rhythm, QTC 426.  No ischemic change.  Assessment/Plan Principal Problem:   Cellulitis of both lower extremities Active Problems:   Dyslipidemia   Essential hypertension   Severe central sleep apnea comorbid  with prescribed opioid use   Diabetes mellitus type 1 with manifestations ([place])  Chronic respiratory failure with hypoxia (HCC)   HLD (hyperlipidemia)   CKD (chronic kidney disease), stage III (HCC)   Depression with anxiety   Chronic pain   Cellulitis of both lower extremities: Patient has bilateral lower leg cellulitis, failed outpatient antibiotic treatment.  He has asymmetric leg edema, will need to rule out DVT.  Patient has fever, but no leukocytosis.  Does not meet criteria for sepsis at this moment.  - will place on tele bed for obs - Empiric antimicrobial treatment with vancomycin and Rocephin (received 1 dose of clindamycin - PRN Zofran for nausea - Continue home pain medication, methadone and Norco - Blood cultures x 2  - ESR and CRP - IVF: 500 L of NS bolus - f/u lactic acid and procalcitonin  Dyslipidemia: -Zocor  HTN:  -Continue home medications: Amlodipine -Hold HCTZ and Cozaar since patient is at risk of developing hypotension due to ongoing infection -IV hydralazine prn  Severe central sleep apnea comorbid with prescribed opioid use: -CPAP  Diabetes mellitus type 1 with manifestations and CKD-III: Last A1c not on record. Patient is taking glargine insulin, metformin and glipizide at home -will decrease glargine insulin dose from 18 to 8 units daily -SSI  Chronic respiratory failure with hypoxia ([place]): Possibly due to COPD.  Patient is on 3 L oxygen at home.  Currently saturation 99% on 2 L nasal cannula oxygen.  Stable. - Atrovent inhaler, PRN albuterol inhaler  CKD (chronic kidney disease), stage III ([place]): Stable.  Baseline creatinine 1.2-1.3.  His creatinine is 1.01, BUN 22 Follow-up renal function by BMP  Depression with anxiety: stable. -Continue home Depakote, nortriptyline, Zoloft, doxepin  Chronic pain: -Continue home Norco and methadone      DVT ppx: SQ Lovenox Code Status: Full code Family Communication: None at bed side. Disposition  Plan:  Anticipate discharge back to previous home environment Consults called:  none Admission status:   medical floor/obs        Date of Service 05/31/2018    [facility]   If 7PM-7AM, please contact night-coverage [email or web address] Password [facility] 05/31/2018, 9:34 PM

## 2018-05-31 NOTE — ED Provider Notes (Signed)
[facility] EMERGENCY DEPARTMENT Provider Note   CSN: [id number] Arrival date & time: 05/31/18  1827    History   Chief Complaint Chief Complaint  Patient presents with  . Cellulitis    HPI [name] is a 63 y.o. male.  Past medical history of hypertension, diabetes, chronic venous stasis who presents with chief complaint of bilateral lower leg redness and swelling. Has been on doxycycline outpatient and saw his orthopedist today who recommended him to come to the ED for  IV abx and DVT study as his erythema and swelling had worsened.       Illness  Severity:  Moderate Onset quality:  Gradual Duration:  2 weeks Timing:  Constant Progression:  Worsening Chronicity:  New Associated symptoms: no abdominal pain, no chest pain, no cough, no ear pain, no fever, no shortness of breath, no sore throat and no vomiting     Past Medical History:  Diagnosis Date  . Anxiety   . Arthritis   . Chronic narcotic use   . Complication of anesthesia    h/o aspiration during back surgery  . Depression   . Diabetic peripheral neuropathy ([place])   . Hearing loss    wears bilateral hearing aids, bilat moderate SNHL  . Hyperlipidemia   . Hypertension   . Insomnia   . Tobacco abuse   . Type 1 diabetes mellitus ([place])   . Ulcer of left ankle ([place])   . Vision abnormalities    mild DM retinopathy    Patient Active Problem List   Diagnosis Date Noted  . Cellulitis of both lower extremities 05/31/2018  . Chronic respiratory failure with hypoxia ([place]) 05/31/2018  . HLD (hyperlipidemia) 05/31/2018  . CKD (chronic kidney disease), stage III ([place]) 05/31/2018  . Depression with anxiety 05/31/2018  . Chronic pain 05/31/2018  . Acute respiratory failure with hypoxia and hypercapnia ([place]) 04/14/2018  . Diabetes mellitus type 1 with manifestations ([place]) 04/14/2018  . Sepsis ([place]) 04/14/2018  . Postop check 10/31/2017  . Severe central sleep apnea comorbid with prescribed  opioid use 10/14/2017  . Insomnia secondary to chronic pain 08/01/2017  . Contact with and (suspected) exposure to environmental tobacco smoke (acute) (chronic) 08/01/2017  . Snoring 08/01/2017  . Venous insufficiency (chronic) (peripheral) 10/04/2016  . Essential hypertension 08/01/2013  . Chronic ulcer of left ankle ([place]) 08/01/2013  . Type I (juvenile type) diabetes mellitus with neurological manifestations, not stated as uncontrolled(250.61) 10/03/2012  . Healthcare-associated pneumonia 10/03/2012  . Dyslipidemia 10/03/2012  . Insomnia 10/03/2012  . Pain in joint, lower leg 10/02/2012  . Special screening for malignant neoplasms, colon 07/26/2010    Past Surgical History:  Procedure Laterality Date  . COCHLEAR IMPLANT Left 10/31/2017   Procedure: COCHLEAR IMPLANT LEFT EAR;  Surgeon: Vicie Mutters, MD;  Location: [facility];  Service: ENT;  Laterality: Left;  . COLONOSCOPY WITH PROPOFOL  07-26-2010  . [place] had 16 tons of steel rolled over his feet  . LUMBAR LAMINECTOMY/DECOMPRESSION MICRODISCECTOMY  12-23-1997   LEFT  L5 -- S1  . TONSILLECTOMY          Home Medications    Prior to Admission medications   Medication Sig Start Date End Date Taking? Authorizing Provider  amLODipine (NORVASC) 5 MG tablet Take 5 mg by mouth daily.    [provider]  aspirin EC 81 MG tablet Take 81 mg by mouth daily.    [provider]  benzonatate (TESSALON) 100 MG capsule Take 100 mg by mouth 2 (two) times daily as needed for cough.  06/24/13   [provider]  cephALEXin (KEFLEX) 500 MG capsule Take 1 capsule (500 mg total) by mouth 3 (three) times daily. 05/22/18   Suzan Slick, NP  divalproex (DEPAKOTE) 250 MG 24 hr tablet Take 250 mg by mouth daily.      [provider]  doxepin (SINEQUAN) 25 MG capsule Take 25 mg by mouth at bedtime.    [provider]  gabapentin (NEURONTIN) 300 MG capsule Take 300 mg by mouth 3  (three) times daily.     [provider]  glipiZIDE (GLUCOTROL XL) 2.5 MG 24 hr tablet Take 2.5-5 mg by mouth See admin instructions. 5 mg in the am and 1 tablet dinner time    [provider]  hydrochlorothiazide (HYDRODIURIL) 25 MG tablet Take 25 mg by mouth daily.    [provider]  HYDROcodone-acetaminophen (NORCO/VICODIN) 5-325 MG tablet Take 1 tablet by mouth 3 (three) times daily as needed for moderate pain.     [provider]  Insulin Glargine (BASAGLAR KWIKPEN) 100 UNIT/ML SOPN Inject 16 Units into the skin daily.     [provider]  losartan (COZAAR) 100 MG tablet Take 100 mg by mouth daily.    [provider]  metFORMIN (GLUCOPHAGE-XR) 500 MG 24 hr tablet Take 500 mg by mouth daily with breakfast.    [provider]  methadone (DOLOPHINE) 5 MG tablet Take 5 mg by mouth 4 (four) times daily.     [provider]  metoprolol tartrate (LOPRESSOR) 25 MG tablet Take 25-50 mg by mouth See admin instructions. 50 mg in the am and 25 mg qhs    [provider]  nortriptyline (PAMELOR) 25 MG capsule Take 25 mg by mouth at bedtime.      [provider]  sertraline (ZOLOFT) 50 MG tablet Take 50 mg by mouth daily.      [provider]  simvastatin (ZOCOR) 20 MG tablet Take 20 mg by mouth at bedtime.      [provider]    Family History Family History  Problem Relation Age of Onset  . Prostate cancer Paternal Uncle   . Prostate cancer Maternal Grandfather   . Diabetes Mother   . Hypertension Mother   . Diabetes Father   . Heart failure Father     Social History Social History   Tobacco Use  . Smoking status: Former Smoker    Packs/day: 1.00    Years: 40.00    Pack years: 40.00    Types: Cigarettes    Last attempt to quit: 04/13/2018    Years since quitting: 0.1  . Smokeless tobacco: Never Used  Substance Use Topics  . Alcohol use: No    Alcohol/week: 0.0 standard drinks   . Drug use: No     Allergies   Codeine and Tetracyclines & related   Review of Systems Review of Systems  Constitutional: Negative for activity change, appetite change, chills and fever.  HENT: Negative for ear pain and sore throat.   Eyes: Negative for pain and visual disturbance.  Respiratory: Negative for cough and shortness of breath.   Cardiovascular: Negative for chest pain and palpitations.  Gastrointestinal: Negative for abdominal pain and vomiting.  Genitourinary: Negative for dysuria and hematuria.  Musculoskeletal: Negative for arthralgias and back pain.  Skin: Negative for color change and wound.  Redness to bilateral calves. With swelling and tenderness   Neurological: Negative for seizures and syncope.  All other systems reviewed and are negative.    Physical Exam Updated Vital Signs BP 139/72   Pulse (!) 54   Temp 98.2 F (36.8 C) (Oral)   Resp 16   Ht 5\' 10"  (1.778 m)   Wt 78.5 kg   SpO2 100%   BMI 24.82 kg/m   Physical Exam Vitals signs and nursing note reviewed.  Constitutional:      Appearance: He is well-developed.  HENT:     Head: Normocephalic and atraumatic.  Eyes:     Conjunctiva/sclera: Conjunctivae normal.  Neck:     Musculoskeletal: Neck supple.  Cardiovascular:     Rate and Rhythm: Normal rate and regular rhythm.     Heart sounds: No murmur.  Pulmonary:     Effort: Pulmonary effort is normal. No respiratory distress.     Breath sounds: Normal breath sounds.  Abdominal:     Palpations: Abdomen is soft.     Tenderness: There is no abdominal tenderness.  Musculoskeletal:        General: Swelling and tenderness present.     Comments: Erythema to bilateral calves. Right calf is more swollen and tender than the left.  Skin:    General: Skin is warm and dry.  Neurological:     Mental Status: He is alert.      ED Treatments / Results  Labs (all labs ordered are listed, but only abnormal results are displayed) Labs  Reviewed  CBC WITH DIFFERENTIAL/PLATELET - Abnormal; Notable for the following components:      Result Value   RBC 3.49 (*)    Hemoglobin 10.2 (*)    HCT 31.9 (*)    All other components within normal limits  COMPREHENSIVE METABOLIC PANEL - Abnormal; Notable for the following components:   Glucose, Bld 156 (*)    Creatinine, Ser 1.31 (*)    AST 14 (*)    GFR calc non Af Amer 57 (*)    All other components within normal limits  CULTURE, BLOOD (ROUTINE X 2)  CULTURE, BLOOD (ROUTINE X 2)  SARS CORONAVIRUS 2 (HOSPITAL ORDER, [facility])  SEDIMENTATION RATE  C-REACTIVE PROTEIN  LACTIC ACID, PLASMA  LACTIC ACID, PLASMA  PROCALCITONIN  BASIC METABOLIC PANEL  CBC    EKG None  Radiology No results found.  Procedures Procedures (including critical care time)  Medications Ordered in ED Medications  cefTRIAXone (ROCEPHIN) 1 g in sodium chloride 0.9 % 100 mL IVPB (has no administration in time range)  albuterol (VENTOLIN HFA) 108 (90 Base) MCG/ACT inhaler 2 puff (has no administration in time range)  dextromethorphan-guaiFENesin (MUCINEX DM) 30-600 MG per 12 hr tablet 1 tablet (has no administration in time range)  enoxaparin (LOVENOX) injection 40 mg (has no administration in time range)  acetaminophen (TYLENOL) tablet 650 mg (has no administration in time range)    Or  acetaminophen (TYLENOL) suppository 650 mg (has no administration in time range)  ondansetron (ZOFRAN) tablet 4 mg (has no administration in time range)    Or  ondansetron (ZOFRAN) injection 4 mg (has no administration in time range)  senna-docusate (Senokot-S) tablet 1 tablet (has no administration in time range)  hydrALAZINE (APRESOLINE) injection 5 mg (has no administration in time range)  vancomycin (VANCOCIN) 1,500 mg in sodium chloride 0.9 % 500 mL IVPB (has no administration in time range)  ipratropium (ATROVENT HFA) inhaler 2 puff (  has no administration in time range)  insulin  aspart (novoLOG) injection 0-9 Units (has no administration in time range)  clindamycin (CLEOCIN) IVPB 600 mg (0 mg Intravenous Stopped 05/31/18 2141)  acetaminophen (TYLENOL) tablet 650 mg (650 mg Oral Given 05/31/18 2107)  sodium chloride 0.9 % bolus 500 mL (500 mLs Intravenous New Bag/Given 05/31/18 2144)     Initial Impression / Assessment and Plan / ED Course  I have reviewed the triage vital signs and the nursing notes.  Pertinent labs & imaging results that were available during my care of the patient were reviewed by me and considered in my medical decision making (see chart for details).        [name] is a 63 y.o. male.  Past medical history of hypertension, diabetes, chronic venous stasis who presents with chief complaint of bilateral lower leg redness and swelling. Has been on doxycycline outpatient and saw his orthopedist today who recommended him to come to the ED for  IV abx and DVT study as his erythema and swelling had worsened.   On initial exam patient well appearing, not in acute distress. VSS except febrile 100.6. Physical exam as above.  Concern for bilateral cellulitis. Patient started on IV clindamycin. Also concern for DVT as right leg is more swollen than left. Patient with no chest pain, SOB, tachycardia or tachypnea.  Patient admitted to hospital for further management.   Final Clinical Impressions(s) / ED Diagnoses   Final diagnoses:  Left leg cellulitis  Cellulitis of right leg    ED Discharge Orders    None       Doneta Public, MD 05/31/18 2147    Jola Schmidt, MD 06/01/18 0025

## 2018-06-01 ENCOUNTER — Inpatient Hospital Stay (HOSPITAL_COMMUNITY): Payer: 59

## 2018-06-01 DIAGNOSIS — Z9981 Dependence on supplemental oxygen: Secondary | ICD-10-CM | POA: Diagnosis not present

## 2018-06-01 DIAGNOSIS — G4731 Primary central sleep apnea: Secondary | ICD-10-CM | POA: Diagnosis present

## 2018-06-01 DIAGNOSIS — N183 Chronic kidney disease, stage 3 (moderate): Secondary | ICD-10-CM | POA: Diagnosis present

## 2018-06-01 DIAGNOSIS — Z20828 Contact with and (suspected) exposure to other viral communicable diseases: Secondary | ICD-10-CM | POA: Diagnosis present

## 2018-06-01 DIAGNOSIS — Z9621 Cochlear implant status: Secondary | ICD-10-CM | POA: Diagnosis present

## 2018-06-01 DIAGNOSIS — D509 Iron deficiency anemia, unspecified: Secondary | ICD-10-CM | POA: Diagnosis present

## 2018-06-01 DIAGNOSIS — L03119 Cellulitis of unspecified part of limb: Secondary | ICD-10-CM | POA: Diagnosis present

## 2018-06-01 DIAGNOSIS — E785 Hyperlipidemia, unspecified: Secondary | ICD-10-CM | POA: Diagnosis present

## 2018-06-01 DIAGNOSIS — E10622 Type 1 diabetes mellitus with other skin ulcer: Secondary | ICD-10-CM | POA: Diagnosis present

## 2018-06-01 DIAGNOSIS — I129 Hypertensive chronic kidney disease with stage 1 through stage 4 chronic kidney disease, or unspecified chronic kidney disease: Secondary | ICD-10-CM | POA: Diagnosis present

## 2018-06-01 DIAGNOSIS — F418 Other specified anxiety disorders: Secondary | ICD-10-CM | POA: Diagnosis present

## 2018-06-01 DIAGNOSIS — L0390 Cellulitis, unspecified: Secondary | ICD-10-CM | POA: Diagnosis not present

## 2018-06-01 DIAGNOSIS — L03115 Cellulitis of right lower limb: Secondary | ICD-10-CM | POA: Diagnosis present

## 2018-06-01 DIAGNOSIS — F1120 Opioid dependence, uncomplicated: Secondary | ICD-10-CM | POA: Diagnosis present

## 2018-06-01 DIAGNOSIS — E1042 Type 1 diabetes mellitus with diabetic polyneuropathy: Secondary | ICD-10-CM | POA: Diagnosis present

## 2018-06-01 DIAGNOSIS — J9611 Chronic respiratory failure with hypoxia: Secondary | ICD-10-CM | POA: Diagnosis present

## 2018-06-01 DIAGNOSIS — L03116 Cellulitis of left lower limb: Secondary | ICD-10-CM | POA: Diagnosis present

## 2018-06-01 DIAGNOSIS — J449 Chronic obstructive pulmonary disease, unspecified: Secondary | ICD-10-CM | POA: Diagnosis present

## 2018-06-01 DIAGNOSIS — I872 Venous insufficiency (chronic) (peripheral): Secondary | ICD-10-CM | POA: Diagnosis present

## 2018-06-01 DIAGNOSIS — E10319 Type 1 diabetes mellitus with unspecified diabetic retinopathy without macular edema: Secondary | ICD-10-CM | POA: Diagnosis present

## 2018-06-01 DIAGNOSIS — E1022 Type 1 diabetes mellitus with diabetic chronic kidney disease: Secondary | ICD-10-CM | POA: Diagnosis present

## 2018-06-01 DIAGNOSIS — I878 Other specified disorders of veins: Secondary | ICD-10-CM | POA: Diagnosis present

## 2018-06-01 DIAGNOSIS — H903 Sensorineural hearing loss, bilateral: Secondary | ICD-10-CM | POA: Diagnosis present

## 2018-06-01 DIAGNOSIS — L97329 Non-pressure chronic ulcer of left ankle with unspecified severity: Secondary | ICD-10-CM | POA: Diagnosis present

## 2018-06-01 DIAGNOSIS — G894 Chronic pain syndrome: Secondary | ICD-10-CM | POA: Diagnosis present

## 2018-06-01 DIAGNOSIS — G4700 Insomnia, unspecified: Secondary | ICD-10-CM | POA: Diagnosis present

## 2018-06-01 LAB — CBC
HCT: 27.3 % — ABNORMAL LOW (ref 39.0–52.0)
Hemoglobin: 8.5 g/dL — ABNORMAL LOW (ref 13.0–17.0)
MCH: 28.2 pg (ref 26.0–34.0)
MCHC: 31.1 g/dL (ref 30.0–36.0)
MCV: 90.7 fL (ref 80.0–100.0)
Platelets: 156 10*3/uL (ref 150–400)
RBC: 3.01 MIL/uL — ABNORMAL LOW (ref 4.22–5.81)
RDW: 14.6 % (ref 11.5–15.5)
WBC: 3.5 10*3/uL — ABNORMAL LOW (ref 4.0–10.5)
nRBC: 0 % (ref 0.0–0.2)

## 2018-06-01 LAB — IRON AND TIBC
Iron: 33 ug/dL — ABNORMAL LOW (ref 45–182)
Saturation Ratios: 12 % — ABNORMAL LOW (ref 17.9–39.5)
TIBC: 270 ug/dL (ref 250–450)
UIBC: 237 ug/dL

## 2018-06-01 LAB — BASIC METABOLIC PANEL
Anion gap: 8 (ref 5–15)
BUN: 19 mg/dL (ref 8–23)
CO2: 27 mmol/L (ref 22–32)
Calcium: 8.3 mg/dL — ABNORMAL LOW (ref 8.9–10.3)
Chloride: 103 mmol/L (ref 98–111)
Creatinine, Ser: 1.32 mg/dL — ABNORMAL HIGH (ref 0.61–1.24)
GFR calc Af Amer: >60 mL/min (ref >60)
GFR calc non Af Amer: 57 mL/min — ABNORMAL LOW (ref >60)
Glucose, Bld: 87 mg/dL (ref 70–99)
Potassium: 4.2 mmol/L (ref 3.5–5.1)
Sodium: 138 mmol/L (ref 135–145)

## 2018-06-01 LAB — GLUCOSE, CAPILLARY
Glucose-Capillary: 117 mg/dL — ABNORMAL HIGH (ref 70–99)
Glucose-Capillary: 113 mg/dL — ABNORMAL HIGH (ref 70–99)
Glucose-Capillary: 119 mg/dL — ABNORMAL HIGH (ref 70–99)
Glucose-Capillary: 96 mg/dL (ref 70–99)

## 2018-06-01 LAB — RETICULOCYTES
Immature Retic Fract: 23.5 % — ABNORMAL HIGH (ref 2.3–15.9)
RBC.: 3.36 MIL/uL — ABNORMAL LOW (ref 4.22–5.81)
Retic Count, Absolute: 85.3 10*3/uL (ref 19.0–186.0)
Retic Ct Pct: 2.5 % (ref 0.4–3.1)

## 2018-06-01 LAB — FERRITIN: Ferritin: 91 ng/mL (ref 24–336)

## 2018-06-01 LAB — VITAMIN B12: Vitamin B-12: 350 pg/mL (ref 180–914)

## 2018-06-01 LAB — FOLATE: Folate: 9.1 ng/mL (ref >5.9)

## 2018-06-01 MED ORDER — HYDROCODONE-ACETAMINOPHEN 5-325 MG PO TABS
1.0000 | ORAL_TABLET | Freq: Three times a day (TID) | ORAL | Status: DC | PRN
  Administered 2018-06-01 – 2018-06-02 (×2): 1 via ORAL
  Filled 2018-06-01 (×3): qty 1

## 2018-06-01 MED ORDER — METHADONE HCL 10 MG PO TABS
10.0000 mg | ORAL_TABLET | Freq: Four times a day (QID) | ORAL | Status: DC
  Administered 2018-06-01 – 2018-06-03 (×7): 10 mg via ORAL
  Filled 2018-06-01 (×7): qty 1

## 2018-06-01 MED ORDER — CALCIUM CARBONATE ANTACID 500 MG PO CHEW
400.0000 mg | CHEWABLE_TABLET | Freq: Two times a day (BID) | ORAL | Status: DC | PRN
  Administered 2018-06-01: 400 mg via ORAL
  Filled 2018-06-01 (×2): qty 2

## 2018-06-01 MED ORDER — FUROSEMIDE 10 MG/ML IJ SOLN
20.0000 mg | Freq: Once | INTRAMUSCULAR | Status: AC
  Administered 2018-06-01: 20 mg via INTRAVENOUS
  Filled 2018-06-01: qty 2

## 2018-06-01 MED ORDER — VANCOMYCIN HCL 10 G IV SOLR
1500.0000 mg | INTRAVENOUS | Status: DC
  Administered 2018-06-01 – 2018-06-02 (×2): 1500 mg via INTRAVENOUS
  Filled 2018-06-01 (×3): qty 1500

## 2018-06-01 NOTE — Progress Notes (Signed)
Patient CPAP setup and at bedside. Patient wanted to eat something first, so RN said she would place him on after he was done and ready. She will call if any issues arise and assistance is needed.

## 2018-06-01 NOTE — Progress Notes (Signed)
Patient attempted CPAP use and with adjustments was unable to tolerate use to for tonight. He wants to just use [place] for now and will reassess later.

## 2018-06-01 NOTE — Progress Notes (Signed)
Bilateral lower extremity venous duplex completed.  06/01/2018 10:51 AM Maudry Mayhew, MHA, RVT, RDCS, RDMS

## 2018-06-01 NOTE — Progress Notes (Signed)
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 -Patient reports that he has had pain and swelling in his legs for a couple of weeks, saw an orthopedic surgeon was started on doxycycline which he is supposed to complete today however despite this he continued to have worsening pain swelling redness and hence presented to the emergency room. -In the ED patient was found to be febrile to 100.6 however normal Kolakowski count, negative COVID-19 PCR   Assessment &   Possible cellulitis Right leg swelling redness >Left -He was treated as outpatient for cellulitis and clinically worsened -On exam has mild cellulitic features however this could be a DVT as well -For now continue IV vancomycin and ceftriaxone -Dopplers to be done today to rule out DVT -He was hospitalized a month ago with hypoxia pneumonia, was negative for COVID then however if the test was a false negative VTE is a possibility  HTN:  -Continue amlodipine -HCTZ and Cozaar on hold  Worsening anemia -Could be from hemodilution -Stop IV fluids, check anemia panel, no overt bleeding will trend and monitor  Severe central sleep apnea comorbid with prescribed opioid use: -CPAP  Diabetes mellitus type 1 with manifestations and CKD-III:  -on glargine insulin, metformin and glipizide at home -CBGs stable, on lower dose of Lantus now  Chronic respiratory failure with hypoxia (HCC)/COPD -Patient is on 3 L oxygen at home.  -Stable, continue Atrovent, as needed albuterol  CKD (chronic kidney disease), stage III ([place]):   -Stable at baseline  Depression with anxiety: stable. -Continue home Depakote, nortriptyline, Zoloft, doxepin  Chronic pain: -Continue home Norco and methadone    DVT prophylaxis: Lovenox Code Status: Full code Family Communication: No family at bedside Disposition Plan: Home pending clinical improvement  Consultants:      Procedures:   Antimicrobials:    Subjective: -Complains of pain and swelling especially in the right leg, ongoing for the last few weeks  Objective: Vitals:   05/31/18 2242 06/01/18 0437 06/01/18 0739 06/01/18 0952  BP: 131/89 (!) 116/47  (!) 138/54  Pulse: (!) 57 (!) 51    Resp: 12 16    Temp: 97.9 F (36.6 C) (!) 97.5 F (36.4 C)    TempSrc: Oral Oral    SpO2: 94% 95% 91%   Weight: 75.3 kg     Height: 5\' 10"  (1.778 m)       Intake/Output Summary (Last 24 hours) at 06/01/2018 1057 Last data filed at 06/01/2018 0400 Gross per 24 hour  Intake 649.69 ml  Output -  Net 649.69 ml   Filed Weights   05/31/18 2036 05/31/18 2242  Weight: 78.5 kg 75.3 kg    Examination:  General exam: Appears calm and comfortable, chronically ill male, appears much older than stated age Respiratory system: Poor air movement, otherwise clear Cardiovascular system: S1 & S2 heard, RRR. Gastrointestinal system: Abdomen is nondistended, soft and nontender.Normal bowel sounds heard. Central nervous system: Alert and oriented. No focal neurological deficits. Extremities: Both lower extremities with erythema and mild swelling in lower legs, swelling much more pronounced on the right  skin: As above Psychiatry: Judgement and  insight appear normal. Mood & affect appropriate.     Data Reviewed:   CBC: Recent Labs  Lab 05/31/18 1918 06/01/18 0230  WBC 5.1 3.5*  NEUTROABS 3.0  --   HGB 10.2* 8.5*  HCT 31.9* 27.3*  MCV 91.4 90.7  PLT 198 893   Basic Metabolic Panel: Recent Labs  Lab 05/31/18 1918 06/01/18 0230  NA 138 138  K 4.6 4.2  CL 100 103  CO2  27 27  GLUCOSE 156* 87  BUN 20 19  CREATININE 1.31* 1.32*  CALCIUM 9.3 8.3*   GFR: Estimated Creatinine Clearance: 58.4 mL/min (A) (by C-G formula based on SCr of 1.32 mg/dL (H)). Liver Function Tests: Recent Labs  Lab 05/31/18 1918  AST 14*  ALT 10  ALKPHOS 86  BILITOT 0.6  PROT 7.3  ALBUMIN 3.8   No results for input(s): LIPASE, AMYLASE in the last 168 hours. No results for input(s): AMMONIA in the last 168 hours. Coagulation Profile: No results for input(s): INR, PROTIME in the last 168 hours. Cardiac Enzymes: No results for input(s): CKTOTAL, CKMB, CKMBINDEX, TROPONINI in the last 168 hours. BNP (last 3 results) No results for input(s): PROBNP in the last 8760 hours. HbA1C: No results for input(s): HGBA1C in the last 72 hours. CBG: Recent Labs  Lab 05/31/18 2305 06/01/18 0819  GLUCAP 84 117*   Lipid Profile: No results for input(s): CHOL, HDL, LDLCALC, TRIG, CHOLHDL, LDLDIRECT in the last 72 hours. Thyroid Function Tests: No results for input(s): TSH, T4TOTAL, FREET4, T3FREE, THYROIDAB in the last 72 hours. Anemia Panel: No results for input(s): VITAMINB12, FOLATE, FERRITIN, TIBC, IRON, RETICCTPCT in the last 72 hours. Urine analysis:    Component Value Date/Time   COLORURINE YELLOW 04/14/2018 2114   APPEARANCEUR CLEAR 04/14/2018 2114   LABSPEC 1.015 04/14/2018 2114   PHURINE 6.0 04/14/2018 2114   GLUCOSEU NEGATIVE 04/14/2018 2114   HGBUR NEGATIVE 04/14/2018 2114   BILIRUBINUR NEGATIVE 04/14/2018 2114   KETONESUR 5 (A) 04/14/2018 2114   PROTEINUR >=300 (A) 04/14/2018 2114   NITRITE NEGATIVE 04/14/2018 2114   LEUKOCYTESUR NEGATIVE 04/14/2018 2114   Sepsis Labs: @LABRCNTIP (procalcitonin:4,lacticidven:4)  ) Recent Results (from the past 240 hour(s))  Blood culture (routine x 2)     Status: None (Preliminary result)   Collection Time: 05/31/18  7:18 PM  Result Value Ref Range Status   Specimen Description BLOOD RIGHT ANTECUBITAL  Final   Special  Requests   Final    BOTTLES DRAWN AEROBIC AND ANAEROBIC Blood Culture results may not be optimal due to an excessive volume of blood received in culture bottles   Culture   Final    NO GROWTH < 24 HOURS Performed at [facility] [address], [place], [place] [address]    Report Status PENDING  Incomplete  Blood culture (routine x 2)     Status: None (Preliminary result)   Collection Time: 05/31/18  7:18 PM  Result Value Ref Range Status   Specimen Description BLOOD LEFT ANTECUBITAL  Final   Special Requests   Final    BOTTLES DRAWN AEROBIC AND ANAEROBIC Blood Culture adequate volume   Culture   Final    NO GROWTH < 24 HOURS Performed at [facility], [place] [address], [place], [place] [address]    Report Status PENDING  Incomplete  SARS Coronavirus 2 (CEPHEID - Performed in [facility] hospital lab), Hosp Order     Status: None   Collection Time: 05/31/18  9:13 PM  Result Value Ref Range Status   SARS Coronavirus 2 NEGATIVE NEGATIVE Final    Comment: (NOTE) If result is NEGATIVE SARS-CoV-2 target nucleic acids are NOT DETECTED. The SARS-CoV-2 RNA is generally detectable in upper and lower  respiratory specimens during the acute phase of infection. The lowest  concentration of SARS-CoV-2 viral copies this assay can detect is 250  copies / mL. A negative result does not preclude SARS-CoV-2 infection  and should not be used as the sole basis for treatment or other  patient management decisions.  A negative result may occur with  improper specimen collection / handling, submission of specimen other  than nasopharyngeal swab, presence of viral mutation(s) within the  areas targeted by this assay, and inadequate number of viral copies  (<250 copies / mL). A negative result must be combined with clinical  observations, patient history, and epidemiological information. If result is POSITIVE SARS-CoV-2 target nucleic acids are DETECTED. The SARS-CoV-2 RNA is generally  detectable in upper and lower  respiratory specimens dur ing the acute phase of infection.  Positive  results are indicative of active infection with SARS-CoV-2.  Clinical  correlation with patient history and other diagnostic information is  necessary to determine patient infection status.  Positive results do  not rule out bacterial infection or co-infection with other viruses. If result is PRESUMPTIVE POSTIVE SARS-CoV-2 nucleic acids MAY BE PRESENT.   A presumptive positive result was obtained on the submitted specimen  and confirmed on repeat testing.  While 2019 novel coronavirus  (SARS-CoV-2) nucleic acids may be present in the submitted sample  additional confirmatory testing may be necessary for epidemiological  and / or clinical management purposes  to differentiate between  SARS-CoV-2 and other Sarbecovirus currently known to infect humans.  If clinically indicated additional testing with an alternate test  methodology [phone]) is advised. The SARS-CoV-2 RNA is generally  detectable in upper and lower respiratory sp ecimens during the acute  phase of infection. The expected result is Negative. Fact Sheet for Patients:  StrictlyIdeas.no Fact Sheet for Healthcare Providers: BankingDealers.co.za This test is not yet approved or cleared by the [place] FDA and has been authorized for detection and/or diagnosis of SARS-CoV-2 by FDA under an Emergency Use Authorization (EUA).  This EUA will remain in effect (meaning this test can be used) for the duration of the COVID-19 declaration under Section 564(b)(1) of the Act, 21 U.S.C. section 360bbb-3(b)(1), unless the authorization is terminated or revoked sooner. Performed at [facility], [place] [address], [place], [place] [address]          Radiology Studies: No results found.      Scheduled Meds: . amLODipine  5 mg Oral Daily  . aspirin EC  81 mg Oral Daily  .  divalproex  250 mg Oral Daily  . doxepin  25 mg Oral QHS  . enoxaparin (LOVENOX) injection  40 mg Subcutaneous Q24H  . gabapentin  300 mg Oral BID  . HYDROcodone-acetaminophen  2 tablet Oral QHS  . insulin aspart  0-9 Units Subcutaneous TID WC  . insulin glargine  8 Units Subcutaneous Daily  . ipratropium  2 puff Inhalation BID  . methadone  10 mg Oral BID WC  . metoprolol tartrate  25 mg Oral QHS  . metoprolol tartrate  50 mg Oral Daily  . nortriptyline  25 mg Oral QHS  . sertraline  50 mg Oral QHS  . simvastatin  20 mg Oral QHS   Continuous Infusions: .  cefTRIAXone (ROCEPHIN)  IV 1 g (05/31/18 2214)     LOS: 0 days    Time spent: 28min    Domenic Polite, MD [facility]  06/01/2018, 10:57 AM

## 2018-06-01 NOTE — Evaluation (Signed)
Physical Therapy Evaluation Patient Details Name: [name] MRN: [id number] DOB: 1955-10-14 Today's Date: 06/01/2018   History of Present Illness  63 y.o. male with medical history significant of hypertension, hyperlipidemia, type 1 diabetes, chronic respiratory failure possibly due to COPD on 3 L oxygen at home, depression with anxiety, chronic pain syndrome on chronic narcotic of methadone, hard of hearing, insomnia, OSA on CPAP, CKD-3, who presents with bilateral lower leg pain.  Clinical Impression  Patient seen for therapy assessment.  Mobilizing well with no noted focal deficits at this time.  No further acute PT needs. Will sign off.    Follow Up Recommendations No PT follow up    Equipment Recommendations  None recommended by PT    Recommendations for Other Services       Precautions / Restrictions Precautions Precaution Comments: wears O2 2 liters at home      Mobility  Bed Mobility Overal bed mobility: Modified Independent                Transfers Overall transfer level: Independent Equipment used: None             General transfer comment: no assist required  Ambulation/Gait Ambulation/Gait assistance: Independent Gait Distance (Feet): 180 Feet Assistive device: None Gait Pattern/deviations: WFL(Within Functional Limits)     General Gait Details: modestly decreased cadence due to pain but overall mobilizing well  Stairs            Wheelchair Mobility    Modified Rankin (Stroke Patients Only)       Balance Overall balance assessment: Independent                                           Pertinent Vitals/Pain Pain Assessment: 0-10 Pain Score: 8  Pain Location: bilateral LEs Pain Descriptors / Indicators: Sore Pain Intervention(s): Monitored during session    Home Living Family/patient expects to be discharged to:: Private residence Living Arrangements: Spouse/significant other Available Help at  Discharge: Family Type of Home: House Home Access: Stairs to enter Entrance Stairs-Rails: Can reach both Technical brewer of Steps: 2 Home Layout: Multi-level Home Equipment: None      Prior Function Level of Independence: Independent               Hand Dominance   Dominant Hand: Right    Extremity/Trunk Assessment   Upper Extremity Assessment Upper Extremity Assessment: Overall WFL for tasks assessed    Lower Extremity Assessment Lower Extremity Assessment: Overall WFL for tasks assessed       Communication   Communication: No difficulties  Cognition Arousal/Alertness: Awake/alert Behavior During Therapy: WFL for tasks assessed/performed Overall Cognitive Status: Within Functional Limits for tasks assessed                                        General Comments      Exercises     Assessment/Plan    PT Assessment Patent does not need any further PT services  PT Problem List         PT Treatment Interventions      PT Goals (Current goals can be found in the Care Plan section)  Acute Rehab PT Goals PT Goal Formulation: All assessment and education complete, DC therapy    Frequency  Barriers to discharge        Co-evaluation               AM-PAC PT "6 Clicks" Mobility  Outcome Measure Help needed turning from your back to your side while in a flat bed without using bedrails?: None Help needed moving from lying on your back to sitting on the side of a flat bed without using bedrails?: None Help needed moving to and from a bed to a chair (including a wheelchair)?: None Help needed standing up from a chair using your arms (e.g., wheelchair or bedside chair)?: None Help needed to walk in hospital room?: None Help needed climbing 3-5 steps with a railing? : A Little 6 Click Score: 23    End of Session Equipment Utilized During Treatment: Oxygen Activity Tolerance: Patient tolerated treatment well Patient left: in  bed;with call bell/phone within reach Nurse Communication: Mobility status PT Visit Diagnosis: Difficulty in walking, not elsewhere classified (R26.2)    Time: 8938-1017 PT Time Calculation (min) (ACUTE ONLY): 16 min   Charges:   PT Evaluation $PT Eval Low Complexity: [place], PT DPT  Board Certified Neurologic Specialist Acute Rehabilitation Services Pager [phone] Office [place] 06/01/2018, 10:06 AM

## 2018-06-02 LAB — BASIC METABOLIC PANEL
Anion gap: 6 (ref 5–15)
BUN: 19 mg/dL (ref 8–23)
CO2: 28 mmol/L (ref 22–32)
Calcium: 8.4 mg/dL — ABNORMAL LOW (ref 8.9–10.3)
Chloride: 102 mmol/L (ref 98–111)
Creatinine, Ser: 1.24 mg/dL (ref 0.61–1.24)
GFR calc Af Amer: >60 mL/min (ref >60)
GFR calc non Af Amer: >60 mL/min (ref >60)
Glucose, Bld: 81 mg/dL (ref 70–99)
Potassium: 4.2 mmol/L (ref 3.5–5.1)
Sodium: 136 mmol/L (ref 135–145)

## 2018-06-02 LAB — CBC
HCT: 26.7 % — ABNORMAL LOW (ref 39.0–52.0)
Hemoglobin: 8.4 g/dL — ABNORMAL LOW (ref 13.0–17.0)
MCH: 28.1 pg (ref 26.0–34.0)
MCHC: 31.5 g/dL (ref 30.0–36.0)
MCV: 89.3 fL (ref 80.0–100.0)
Platelets: 173 10*3/uL (ref 150–400)
RBC: 2.99 MIL/uL — ABNORMAL LOW (ref 4.22–5.81)
RDW: 14.7 % (ref 11.5–15.5)
WBC: 3.6 10*3/uL — ABNORMAL LOW (ref 4.0–10.5)
nRBC: 0 % (ref 0.0–0.2)

## 2018-06-02 LAB — GLUCOSE, CAPILLARY
Glucose-Capillary: 73 mg/dL (ref 70–99)
Glucose-Capillary: 121 mg/dL — ABNORMAL HIGH (ref 70–99)
Glucose-Capillary: 132 mg/dL — ABNORMAL HIGH (ref 70–99)
Glucose-Capillary: 78 mg/dL (ref 70–99)
Glucose-Capillary: 87 mg/dL (ref 70–99)

## 2018-06-02 MED ORDER — FUROSEMIDE 10 MG/ML IJ SOLN
20.0000 mg | Freq: Once | INTRAMUSCULAR | Status: AC
  Administered 2018-06-02: 20 mg via INTRAVENOUS
  Filled 2018-06-02: qty 2

## 2018-06-02 NOTE — Progress Notes (Signed)
Pt does not want to use CPAP tonight.  RT will continue to monitor.

## 2018-06-02 NOTE — Progress Notes (Signed)
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 -Patient reports that he has had pain and swelling in his legs for a couple of weeks, saw an orthopedic surgeon was started on doxycycline which he is supposed to complete today however despite this he continued to have worsening pain swelling redness and hence presented to the emergency room. -In the ED patient was found to be febrile to 100.6 however normal Hosman count, negative COVID-19 PCR   Assessment &   Cellulitis Right leg swelling, redness >Left -He was treated as outpatient for cellulitis and clinically worsened -Dopplers negative for DVT, clinically improving with IV vancomycin and ceftriaxone -Continue current antibiotics today -Elevate legs we will also give Lasix x1 for iatrogenic edema  HTN:  -Continue amlodipine -HCTZ and Cozaar on hold  Iron deficiency anemia -Worsened in the setting of hemodilution -We will give IV iron  Severe central sleep apnea comorbid with prescribed opioid use: -CPAP  Diabetes mellitus type 1 with manifestations and CKD-III:  -on glargine insulin, metformin and glipizide at home -CBGs stable, on lower dose of Lantus now  Chronic respiratory failure with hypoxia (HCC)/COPD -Patient is on 3 L oxygen at home.  -Stable, continue Atrovent, as needed albuterol  CKD (chronic kidney disease), stage III ([place]):  -Stable at baseline  Depression with anxiety: stable. -Continue home Depakote, nortriptyline, Zoloft, doxepin  Chronic pain: -Continue home Norco and methadone     DVT prophylaxis: Lovenox Code Status: Full code Family Communication: No family at bedside Disposition Plan: Home pending clinical improvement  Consultants:      Procedures:   Antimicrobials:    Subjective: -Pain and swelling improving  Objective: Vitals:   06/01/18 2110 06/01/18 2235 06/02/18 0414 06/02/18 0717  BP:  (!) 132/57 (!) 145/56   Pulse: (!) 57 60 (!) 59   Resp: 16     Temp:   97.7 F (36.5 C)   TempSrc:   Oral   SpO2: 92%  90% 90%  Weight:      Height:        Intake/Output Summary (Last 24 hours) at 06/02/2018 1429 Last data filed at 06/02/2018 1215 Gross per 24 hour  Intake 0 ml  Output 750 ml  Net -750 ml   Filed Weights   05/31/18 2036 05/31/18 2242  Weight: 78.5 kg 75.3 kg    Examination:  Gen: Awake, Alert, Oriented X 3,  HEENT: PERRLA, Neck supple, no JVD Lungs: Good air movement bilaterally, CTAB CVS: RRR,No Gallops,Rubs or new Murmurs Abd: soft, Non tender, non distended, BS present Extremities: Redness and swelling improving especially in the right leg  skin: no new rashes Psychiatry: Judgement and insight appear normal. Mood & affect appropriate.     Data Reviewed:   CBC: Recent Labs  Lab 05/31/18 1918 06/01/18 0230 06/02/18 0329  WBC 5.1 3.5* 3.6*  NEUTROABS 3.0  --   --   HGB 10.2* 8.5* 8.4*  HCT 31.9* 27.3* 26.7*  MCV 91.4 90.7 89.3  PLT 198 156 032   Basic Metabolic Panel: Recent Labs  Lab 05/31/18 1918 06/01/18 0230 06/02/18 0329  NA 138 138 136  K 4.6 4.2 4.2  CL 100 103 102  CO2 27 27 28   GLUCOSE 156* 87 81  BUN 20 19 19   CREATININE 1.31* 1.32* 1.24  CALCIUM 9.3 8.3* 8.4*   GFR: Estimated Creatinine Clearance: 62.1 mL/min (by C-G formula based on SCr of 1.24 mg/dL). Liver Function Tests: Recent Labs  Lab 05/31/18 1918  AST 14*  ALT 10  ALKPHOS 86  BILITOT 0.6  PROT 7.3  ALBUMIN 3.8   No results for input(s): LIPASE, AMYLASE in the last 168 hours. No results for input(s): AMMONIA in the  last 168 hours. Coagulation Profile: No results for input(s): INR, PROTIME in the last 168 hours. Cardiac Enzymes: No results for input(s): CKTOTAL, CKMB, CKMBINDEX, TROPONINI in the last 168 hours. BNP (last 3 results) No results for input(s): PROBNP in the last 8760 hours. HbA1C: No results for input(s): HGBA1C in the last 72 hours. CBG: Recent Labs  Lab 06/01/18 1129 06/01/18 1734 06/01/18 2237 06/02/18 0805 06/02/18 1200  GLUCAP 113* 119* 96 73 121*   Lipid Profile: No results for input(s): CHOL, HDL, LDLCALC, TRIG, CHOLHDL, LDLDIRECT in the last 72 hours. Thyroid Function Tests: No results for input(s): TSH, T4TOTAL, FREET4, T3FREE, THYROIDAB in the last 72 hours. Anemia Panel: Recent Labs    06/01/18 1215  VITAMINB12 350  FOLATE 9.1  FERRITIN 91  TIBC 270  IRON 33*  RETICCTPCT 2.5   Urine analysis:    Component Value Date/Time   COLORURINE YELLOW 04/14/2018 2114   APPEARANCEUR CLEAR 04/14/2018 2114   LABSPEC 1.015 04/14/2018 2114   PHURINE 6.0 04/14/2018 2114   GLUCOSEU NEGATIVE 04/14/2018 2114   HGBUR NEGATIVE 04/14/2018 2114   BILIRUBINUR NEGATIVE 04/14/2018 2114   KETONESUR 5 (A) 04/14/2018 2114   PROTEINUR >=300 (A) 04/14/2018 2114   NITRITE NEGATIVE 04/14/2018 2114   LEUKOCYTESUR NEGATIVE 04/14/2018 2114   Sepsis Labs: @LABRCNTIP (procalcitonin:4,lacticidven:4)  ) Recent Results (from the past 240 hour(s))  Blood culture (routine x 2)     Status: None (Preliminary result)   Collection Time: 05/31/18  7:18 PM  Result Value Ref Range Status   Specimen Description BLOOD RIGHT ANTECUBITAL  Final   Special Requests   Final    BOTTLES DRAWN AEROBIC AND ANAEROBIC Blood Culture results may not be optimal due to an excessive volume of blood received in culture bottles   Culture   Final    NO GROWTH < 24 HOURS Performed at [facility], [place] [address] [place], [place] [address]    Report Status PENDING  Incomplete  Blood culture (routine x 2)      Status: None (Preliminary result)   Collection Time: 05/31/18  7:18 PM  Result Value Ref Range Status   Specimen Description BLOOD LEFT ANTECUBITAL  Final   Special Requests   Final    BOTTLES DRAWN AEROBIC AND ANAEROBIC Blood Culture adequate volume   Culture   Final    NO GROWTH < 24 HOURS Performed at [facility], [place] [address] [place], [place] [address]    Report Status PENDING  Incomplete  SARS Coronavirus 2 (CEPHEID - Performed in [place] hospital lab), Hosp Order     Status: None   Collection Time: 05/31/18  9:13 PM  Result Value Ref Range Status   SARS Coronavirus 2 NEGATIVE NEGATIVE Final    Comment: (NOTE) If result is NEGATIVE SARS-CoV-2 target nucleic acids are NOT DETECTED. The SARS-CoV-2 RNA is generally detectable in upper and lower  respiratory specimens  during the acute phase of infection. The lowest  concentration of SARS-CoV-2 viral copies this assay can detect is 250  copies / mL. A negative result does not preclude SARS-CoV-2 infection  and should not be used as the sole basis for treatment or other  patient management decisions.  A negative result may occur with  improper specimen collection / handling, submission of specimen other  than nasopharyngeal swab, presence of viral mutation(s) within the  areas targeted by this assay, and inadequate number of viral copies  (<250 copies / mL). A negative result must be combined with clinical  observations, patient history, and epidemiological information. If result is POSITIVE SARS-CoV-2 target nucleic acids are DETECTED. The SARS-CoV-2 RNA is generally detectable in upper and lower  respiratory specimens dur ing the acute phase of infection.  Positive  results are indicative of active infection with SARS-CoV-2.  Clinical  correlation with patient history and other diagnostic information is  necessary to determine patient infection status.  Positive results do  not rule out bacterial infection or  co-infection with other viruses. If result is PRESUMPTIVE POSTIVE SARS-CoV-2 nucleic acids MAY BE PRESENT.   A presumptive positive result was obtained on the submitted specimen  and confirmed on repeat testing.  While 2019 novel coronavirus  (SARS-CoV-2) nucleic acids may be present in the submitted sample  additional confirmatory testing may be necessary for epidemiological  and / or clinical management purposes  to differentiate between  SARS-CoV-2 and other Sarbecovirus currently known to infect humans.  If clinically indicated additional testing with an alternate test  methodology [phone]) is advised. The SARS-CoV-2 RNA is generally  detectable in upper and lower respiratory sp ecimens during the acute  phase of infection. The expected result is Negative. Fact Sheet for Patients:  StrictlyIdeas.no Fact Sheet for Healthcare Providers: BankingDealers.co.za This test is not yet approved or cleared by the [place] FDA and has been authorized for detection and/or diagnosis of SARS-CoV-2 by FDA under an Emergency Use Authorization (EUA).  This EUA will remain in effect (meaning this test can be used) for the duration of the COVID-19 declaration under Section 564(b)(1) of the Act, 21 U.S.C. section 360bbb-3(b)(1), unless the authorization is terminated or revoked sooner. Performed at [facility], [address] [place], [place] [address]          Radiology Studies: Vas [place] Lower Extremity Venous (dvt)  Result Date: 06/02/2018  Lower Venous Study Indications: Cellulitis.  Performing Technologist: Maudry Mayhew MHA, RDMS, RVT, RDCS  Examination Guidelines: A complete evaluation includes B-mode imaging, spectral Doppler, color Doppler, and power Doppler as needed of all accessible portions of each vessel. Bilateral testing is considered an integral part of a complete examination. Limited examinations for reoccurring  indications may be performed as noted.  +---------+---------------+---------+-----------+----------+--------------+ RIGHT    CompressibilityPhasicitySpontaneityPropertiesSummary        +---------+---------------+---------+-----------+----------+--------------+ CFV      Full           No       Yes                  pulsatile flow +---------+---------------+---------+-----------+----------+--------------+ SFJ      Full                                                        +---------+---------------+---------+-----------+----------+--------------+  FV Prox  Full                                                        +---------+---------------+---------+-----------+----------+--------------+ FV Mid   Full                                                        +---------+---------------+---------+-----------+----------+--------------+ FV DistalFull                                                        +---------+---------------+---------+-----------+----------+--------------+ PFV      Full                                                        +---------+---------------+---------+-----------+----------+--------------+ POP      Full           No       Yes                  pulsatile flow +---------+---------------+---------+-----------+----------+--------------+ PTV      Full                                                        +---------+---------------+---------+-----------+----------+--------------+ PERO                                                  Not visualized +---------+---------------+---------+-----------+----------+--------------+   +---------+---------------+---------+-----------+----------+--------------+ LEFT     CompressibilityPhasicitySpontaneityPropertiesSummary        +---------+---------------+---------+-----------+----------+--------------+ CFV      Full           No       Yes                  pulsatile flow  +---------+---------------+---------+-----------+----------+--------------+ SFJ      Full                                                        +---------+---------------+---------+-----------+----------+--------------+ FV Prox  Full                                                        +---------+---------------+---------+-----------+----------+--------------+ FV Mid   Full                                                        +---------+---------------+---------+-----------+----------+--------------+  FV DistalFull                                                        +---------+---------------+---------+-----------+----------+--------------+ PFV      Full                                                        +---------+---------------+---------+-----------+----------+--------------+ POP      Full           No       Yes                  puslatile flow +---------+---------------+---------+-----------+----------+--------------+ PTV      Full                                                        +---------+---------------+---------+-----------+----------+--------------+ PERO                                                  Not visualized +---------+---------------+---------+-----------+----------+--------------+     Summary: Right: There is no evidence of deep vein thrombosis in the lower extremity. However, portions of this examination were limited- see technologist comments above. No cystic structure found in the popliteal fossa. Pulsatile lower extremity venous flow is suggestive of elevated right sided heart pressure. Ultrasound characteristics of enlarged lymph nodes are noted in the groin.  Left: There is no evidence of deep vein thrombosis in the lower extremity. However, portions of this examination were limited- see technologist comments above. No cystic structure found in the popliteal fossa. Pulsatile lower extremity venous flow is suggestive of  elevated right sided heart pressure. Ultrasound characteristics of enlarged lymph nodes noted in the groin.  *See table(s) above for measurements and observations. Electronically signed by Ruta Hinds MD on 06/02/2018 at 9:59:32 AM.    Final         Scheduled Meds: . amLODipine  5 mg Oral Daily  . aspirin EC  81 mg Oral Daily  . divalproex  250 mg Oral Daily  . doxepin  25 mg Oral QHS  . enoxaparin (LOVENOX) injection  40 mg Subcutaneous Q24H  . gabapentin  300 mg Oral BID  . insulin aspart  0-9 Units Subcutaneous TID WC  . insulin glargine  8 Units Subcutaneous Daily  . ipratropium  2 puff Inhalation BID  . methadone  10 mg Oral QID  . metoprolol tartrate  25 mg Oral QHS  . metoprolol tartrate  50 mg Oral Daily  . nortriptyline  25 mg Oral QHS  . sertraline  50 mg Oral QHS  . simvastatin  20 mg Oral QHS   Continuous Infusions: . cefTRIAXone (ROCEPHIN)  IV 1 g (06/01/18 2249)  . vancomycin 1,500 mg (06/01/18 2332)     LOS: 1 day    Time spent: 11min    Domenic Polite, MD [facility]  06/02/2018, 2:29 PM

## 2018-06-03 ENCOUNTER — Other Ambulatory Visit: Payer: Self-pay

## 2018-06-03 DIAGNOSIS — J441 Chronic obstructive pulmonary disease with (acute) exacerbation: Secondary | ICD-10-CM

## 2018-06-03 LAB — GLUCOSE, CAPILLARY: Glucose-Capillary: 82 mg/dL (ref 70–99)

## 2018-06-03 MED ORDER — CEFPODOXIME PROXETIL 100 MG PO TABS: 100.0000 mg | ORAL_TABLET | Freq: Two times a day (BID) | ORAL | 0 refills | Status: AC

## 2018-06-03 NOTE — Plan of Care (Signed)
  Problem: Education: Goal: Knowledge of General Education information will improve Description: Including pain rating scale, medication(s)/side effects and non-pharmacologic comfort measures Outcome: Progressing   Problem: Clinical Measurements: Goal: Will remain free from infection Outcome: Progressing   Problem: Clinical Measurements: Goal: Respiratory complications will improve Outcome: Progressing   Problem: Activity: Goal: Risk for activity intolerance will decrease Outcome: Progressing   Problem: Pain Managment: Goal: General experience of comfort will improve Outcome: Progressing   Problem: Safety: Goal: Ability to remain free from injury will improve Outcome: Progressing   Problem: Skin Integrity: Goal: Risk for impaired skin integrity will decrease Outcome: Progressing   

## 2018-06-03 NOTE — Consult Note (Signed)
COPD monitoring needs.  Post hospital follow up from inpatient stay with [place] follow up for needs.  Placed in COPD EMMI calls for follow up.  Natividad Brood, RN BSN [facility] Liaison  [phone] business mobile phone Toll free office [phone]  Fax number: [phone] [email or web address]

## 2018-06-04 NOTE — Discharge Summary (Addendum)
Physician Discharge Summary  [name] ZJQ:[id number] DOB: 11/23/1955 DOA: 05/31/2018  PCP: Leanna Battles, MD  Admit date: 05/31/2018 Discharge date: 06/03/2018  Time spent: 35 minutes  Recommendations for Outpatient Follow-up:  PCP in 1 week, consider iron therapy and work-up for iron deficiency anemia at follow-up -Monitor for lower extremity edema and change HCTZ to low-dose Lasix if needed  Discharge Diagnoses:  Principal Problem:   Cellulitis of right lower extremity Active Problems:   Dyslipidemia   Essential hypertension   Severe central sleep apnea comorbid with prescribed opioid use   Diabetes mellitus type 1 with manifestations ([place])   Chronic respiratory failure with hypoxia (HCC)   HLD (hyperlipidemia)   CKD (chronic kidney disease), stage III ([place])   Depression with anxiety   Chronic pain   Cellulitis of lower extremity   Discharge Condition: Improved  Diet recommendation: Low-sodium, heart healthy  Filed Weights   05/31/18 2036 05/31/18 2242  Weight: 78.5 kg 75.3 kg    History of present illness:  63 year old male with history of COPD/chronic respiratory failure on 3 L home O2, type 1 diabetes mellitus, chronic pain syndrome, 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 -Patient reports that he has had pain and swelling in his legs for a couple of weeks, saw an orthopedic surgeon was started on doxycycline which he is supposed to complete  however despite this he continued to have worsening pain swelling redness and hence presented to the emergency room. -In the ED patient was found to be febrile to 100.6 however normal [place] count, negative COVID-19 PCR  Hospital Course:   Right lower extremity cellulitis -Patient was admitted with worsening redness swelling and pain in his right lower  extremity after having failed over a week's worth of oral doxycycline as outpatient -He was febrile in the emergency room, started on IV vancomycin and ceftriaxone, due to risk of worsening especially given type 2 diabetes mellitus  -Clinically started improving on this regimen, required monitoring of his kidney function given stage 3 CKD and vancomycin use, fortunately his kidney function remained stable  -His pain improved by the time of discharge still had mild residual redness -transitioned to oral antibiotics at discharge  -Also had Dopplers done early during this hospital stay which was negative for DVT  -Advised patient to keep his legs elevated also emphasized importance of compliance with HCTZ and low-salt diet   HTN:  -Continue amlodipine -HCTZ and Cozaar resumed  Iron deficiency anemia -Worsened in the setting of hemodilution -Needs iron therapy at follow-up and work-up for iron deficiency anemia if this has not been done before  Severe central sleep apnea comorbid with prescribed opioid use: -CPAP  Diabetes mellitus type 1 with manifestations and CKD-III: -onglargine insulin, metformin and glipizideat home -CBGs stable, home regimen resumed at discharge  Chronic respiratory failure with hypoxia (HCC)/COPD -Patient is on 3 L oxygen at home.  -Stable, continue Atrovent, as needed albuterol  CKD (chronic kidney disease), stage III ([place]): -Stable at baseline  Depression with anxiety:stable. -Continue home Depakote, nortriptyline, Zoloft,doxepin  Chronic pain: -Continue home Norco and methadone       Discharge Exam: Vitals:   06/03/18 0820 06/03/18 0822  BP:    Pulse:    Resp:    Temp:    SpO2: (!) 82% 91%    General: AAOx3 Cardiovascular: S1S2/RRR Respiratory:  CTAB  Discharge Instructions    Allergies as of 06/03/2018      Reactions   Codeine Nausea And Vomiting   Tetracyclines & Related Hives      Medication List    STOP taking  these medications   cephALEXin 500 MG capsule Commonly known as:  Keflex     TAKE these medications   amLODipine 5 MG tablet Commonly known as:  NORVASC Take 5 mg by mouth daily.   aspirin EC 81 MG tablet Take 81 mg by mouth daily.   Basaglar KwikPen 100 UNIT/ML Sopn Inject 16 Units into the skin daily.   benzonatate 100 MG capsule Commonly known as:  TESSALON Take 100 mg by mouth 2 (two) times daily as needed for cough.   cefpodoxime 100 MG tablet Commonly known as:  VANTIN Take 1 tablet (100 mg total) by mouth 2 (two) times daily for 4 days.   divalproex 250 MG DR tablet Commonly known as:  DEPAKOTE Take 250 mg by mouth daily.   doxepin 25 MG capsule Commonly known as:  SINEQUAN Take 25 mg by mouth at bedtime.   gabapentin 300 MG capsule Commonly known as:  NEURONTIN Take 300 mg by mouth 2 (two) times daily.   glipiZIDE 2.5 MG 24 hr tablet Commonly known as:  GLUCOTROL XL Take 2.5-5 mg by mouth See admin instructions. Take 2 tablets (5 mg) by mouth with breakfast and 1 tablet (2.5 mg) with supper   hydrochlorothiazide 25 MG tablet Commonly known as:  HYDRODIURIL Take 25 mg by mouth daily.   HYDROcodone-acetaminophen 5-325 MG tablet Commonly known as:  NORCO/VICODIN Take 2 tablets by mouth at bedtime.   losartan 100 MG tablet Commonly known as:  COZAAR Take 100 mg by mouth daily.   metFORMIN 500 MG 24 hr tablet Commonly known as:  GLUCOPHAGE-XR Take 500 mg by mouth daily with breakfast.   methadone 5 MG tablet Commonly known as:  DOLOPHINE Take 10 mg by mouth 2 (two) times daily with a meal.   metoprolol tartrate 25 MG tablet Commonly known as:  LOPRESSOR Take 25-50 mg by mouth See admin instructions. Take 2 tablets (50 mg) by mouth every morning and 1 tablet (25 mg) at bedtime   nortriptyline 25 MG capsule Commonly known as:  PAMELOR Take 25 mg by mouth at bedtime.   sertraline 50 MG tablet Commonly known as:  ZOLOFT Take 50 mg by mouth at  bedtime.   simvastatin 20 MG tablet Commonly known as:  ZOCOR Take 20 mg by mouth at bedtime.      Allergies  Allergen Reactions  . Codeine Nausea And Vomiting  . Tetracyclines & Related Hives   Follow-up Information    Leanna Battles, MD. Schedule an appointment as soon as possible for a visit in 1 week(s).   Specialty:  Internal Medicine Contact information: [place] [address] [phone]            The results of significant diagnostics from this hospitalization (including imaging, microbiology, ancillary and laboratory) are listed below for reference.    Significant Diagnostic Studies: Vas [place] Lower Extremity Venous (dvt)  Result Date: 06/02/2018  Lower Venous Study Indications: Cellulitis.  Performing Technologist: Maudry Mayhew MHA, RDMS, RVT, RDCS  Examination Guidelines: A complete evaluation includes B-mode imaging, spectral Doppler, color Doppler, and power Doppler as needed of all accessible portions of each vessel. Bilateral testing is considered an integral part of a complete examination. Limited examinations for reoccurring indications may be  performed as noted.  +---------+---------------+---------+-----------+----------+--------------+ RIGHT    CompressibilityPhasicitySpontaneityPropertiesSummary        +---------+---------------+---------+-----------+----------+--------------+ CFV      Full           No       Yes                  pulsatile flow +---------+---------------+---------+-----------+----------+--------------+ SFJ      Full                                                        +---------+---------------+---------+-----------+----------+--------------+ FV Prox  Full                                                        +---------+---------------+---------+-----------+----------+--------------+ FV Mid   Full                                                         +---------+---------------+---------+-----------+----------+--------------+ FV DistalFull                                                        +---------+---------------+---------+-----------+----------+--------------+ PFV      Full                                                        +---------+---------------+---------+-----------+----------+--------------+ POP      Full           No       Yes                  pulsatile flow +---------+---------------+---------+-----------+----------+--------------+ PTV      Full                                                        +---------+---------------+---------+-----------+----------+--------------+ PERO                                                  Not visualized +---------+---------------+---------+-----------+----------+--------------+   +---------+---------------+---------+-----------+----------+--------------+ LEFT     CompressibilityPhasicitySpontaneityPropertiesSummary        +---------+---------------+---------+-----------+----------+--------------+ CFV      Full           No       Yes                  pulsatile flow +---------+---------------+---------+-----------+----------+--------------+ SFJ      Full                                                        +---------+---------------+---------+-----------+----------+--------------+  FV Prox  Full                                                        +---------+---------------+---------+-----------+----------+--------------+ FV Mid   Full                                                        +---------+---------------+---------+-----------+----------+--------------+ FV DistalFull                                                        +---------+---------------+---------+-----------+----------+--------------+ PFV      Full                                                         +---------+---------------+---------+-----------+----------+--------------+ POP      Full           No       Yes                  puslatile flow +---------+---------------+---------+-----------+----------+--------------+ PTV      Full                                                        +---------+---------------+---------+-----------+----------+--------------+ PERO                                                  Not visualized +---------+---------------+---------+-----------+----------+--------------+     Summary: Right: There is no evidence of deep vein thrombosis in the lower extremity. However, portions of this examination were limited- see technologist comments above. No cystic structure found in the popliteal fossa. Pulsatile lower extremity venous flow is suggestive of elevated right sided heart pressure. Ultrasound characteristics of enlarged lymph nodes are noted in the groin.  Left: There is no evidence of deep vein thrombosis in the lower extremity. However, portions of this examination were limited- see technologist comments above. No cystic structure found in the popliteal fossa. Pulsatile lower extremity venous flow is suggestive of elevated right sided heart pressure. Ultrasound characteristics of enlarged lymph nodes noted in the groin.  *See table(s) above for measurements and observations. Electronically signed by Ruta Hinds MD on 06/02/2018 at 9:59:32 AM.    Final     Microbiology: Recent Results (from the past 240 hour(s))  Blood culture (routine x 2)     Status: None (Preliminary result)   Collection Time: 05/31/18  7:18 PM  Result Value Ref Range Status   Specimen Description BLOOD RIGHT ANTECUBITAL  Final   Special Requests   Final  BOTTLES DRAWN AEROBIC AND ANAEROBIC Blood Culture results may not be optimal due to an excessive volume of blood received in culture bottles   Culture   Final    NO GROWTH 4 DAYS Performed at [facility] [address], [place], [place] [address]    Report Status PENDING  Incomplete  Blood culture (routine x 2)     Status: None (Preliminary result)   Collection Time: 05/31/18  7:18 PM  Result Value Ref Range Status   Specimen Description BLOOD LEFT ANTECUBITAL  Final   Special Requests   Final    BOTTLES DRAWN AEROBIC AND ANAEROBIC Blood Culture adequate volume   Culture   Final    NO GROWTH 4 DAYS Performed at [facility], [place] [address], [place], [place] [address]    Report Status PENDING  Incomplete  SARS Coronavirus 2 (CEPHEID - Performed in [facility] hospital lab), Hosp Order     Status: None   Collection Time: 05/31/18  9:13 PM  Result Value Ref Range Status   SARS Coronavirus 2 NEGATIVE NEGATIVE Final    Comment: (NOTE) If result is NEGATIVE SARS-CoV-2 target nucleic acids are NOT DETECTED. The SARS-CoV-2 RNA is generally detectable in upper and lower  respiratory specimens during the acute phase of infection. The lowest  concentration of SARS-CoV-2 viral copies this assay can detect is 250  copies / mL. A negative result does not preclude SARS-CoV-2 infection  and should not be used as the sole basis for treatment or other  patient management decisions.  A negative result may occur with  improper specimen collection / handling, submission of specimen other  than nasopharyngeal swab, presence of viral mutation(s) within the  areas targeted by this assay, and inadequate number of viral copies  (<250 copies / mL). A negative result must be combined with clinical  observations, patient history, and epidemiological information. If result is POSITIVE SARS-CoV-2 target nucleic acids are DETECTED. The SARS-CoV-2 RNA is generally detectable in upper and lower  respiratory specimens dur ing the acute phase of infection.  Positive  results are indicative of active infection with SARS-CoV-2.  Clinical  correlation with patient history and other diagnostic information is  necessary  to determine patient infection status.  Positive results do  not rule out bacterial infection or co-infection with other viruses. If result is PRESUMPTIVE POSTIVE SARS-CoV-2 nucleic acids MAY BE PRESENT.   A presumptive positive result was obtained on the submitted specimen  and confirmed on repeat testing.  While 2019 novel coronavirus  (SARS-CoV-2) nucleic acids may be present in the submitted sample  additional confirmatory testing may be necessary for epidemiological  and / or clinical management purposes  to differentiate between  SARS-CoV-2 and other Sarbecovirus currently known to infect humans.  If clinically indicated additional testing with an alternate test  methodology [phone]) is advised. The SARS-CoV-2 RNA is generally  detectable in upper and lower respiratory sp ecimens during the acute  phase of infection. The expected result is Negative. Fact Sheet for Patients:  StrictlyIdeas.no Fact Sheet for Healthcare Providers: BankingDealers.co.za This test is not yet approved or cleared by the [place] FDA and has been authorized for detection and/or diagnosis of SARS-CoV-2 by FDA under an Emergency Use Authorization (EUA).  This EUA will remain in effect (meaning this test can be used) for the duration of the COVID-19 declaration under Section 564(b)(1) of the Act, 21 U.S.C. section 360bbb-3(b)(1), unless the authorization is terminated or revoked  sooner. Performed at [facility], [place] [address], [place], [place] [address]      Labs: Basic Metabolic Panel: Recent Labs  Lab 05/31/18 1918 06/01/18 0230 06/02/18 0329  NA 138 138 136  K 4.6 4.2 4.2  CL 100 103 102  CO2 27 27 28   GLUCOSE 156* 87 81  BUN 20 19 19   CREATININE 1.31* 1.32* 1.24  CALCIUM 9.3 8.3* 8.4*   Liver Function Tests: Recent Labs  Lab 05/31/18 1918  AST 14*  ALT 10  ALKPHOS 86  BILITOT 0.6  PROT 7.3  ALBUMIN 3.8   No results for  input(s): LIPASE, AMYLASE in the last 168 hours. No results for input(s): AMMONIA in the last 168 hours. CBC: Recent Labs  Lab 05/31/18 1918 06/01/18 0230 06/02/18 0329  WBC 5.1 3.5* 3.6*  NEUTROABS 3.0  --   --   HGB 10.2* 8.5* 8.4*  HCT 31.9* 27.3* 26.7*  MCV 91.4 90.7 89.3  PLT 198 156 173   Cardiac Enzymes: No results for input(s): CKTOTAL, CKMB, CKMBINDEX, TROPONINI in the last 168 hours. BNP: BNP (last 3 results) No results for input(s): BNP in the last 8760 hours.  ProBNP (last 3 results) No results for input(s): PROBNP in the last 8760 hours.  CBG: Recent Labs  Lab 06/02/18 1200 06/02/18 1654 06/02/18 2137 06/02/18 2228 06/03/18 0804  GLUCAP 121* 132* 78 87 82       Signed:  Domenic Polite MD.  [facility] 06/04/2018, 3:06 PM

## 2018-06-05 LAB — CULTURE, BLOOD (ROUTINE X 2)
Culture: NO GROWTH
Culture: NO GROWTH
Special Requests: ADEQUATE

## 2018-06-07 ENCOUNTER — Encounter: Payer: Self-pay | Admitting: Physician Assistant

## 2018-06-07 ENCOUNTER — Ambulatory Visit (INDEPENDENT_AMBULATORY_CARE_PROVIDER_SITE_OTHER): Payer: 59 | Admitting: Physician Assistant

## 2018-06-07 ENCOUNTER — Other Ambulatory Visit: Payer: Self-pay

## 2018-06-07 VITALS — Ht 70.0 in | Wt 166.0 lb

## 2018-06-07 DIAGNOSIS — E108 Type 1 diabetes mellitus with unspecified complications: Secondary | ICD-10-CM | POA: Diagnosis not present

## 2018-06-07 DIAGNOSIS — L03115 Cellulitis of right lower limb: Secondary | ICD-10-CM | POA: Diagnosis not present

## 2018-06-07 DIAGNOSIS — I872 Venous insufficiency (chronic) (peripheral): Secondary | ICD-10-CM

## 2018-06-07 NOTE — Progress Notes (Signed)
Office Visit Note   Patient: [name]           Date of Birth: Dec 09, 1955           MRN: [id number] Visit Date: 06/07/2018              Requested by: Leanna Battles, [place], [place] [address] PCP: Leanna Battles, MD  Chief Complaint  Patient presents with  . Right Leg - Follow-up      HPI: The patient is a 63 year old gentleman who is seen for follow-up of cellulitis of the right lower extremity.  He was evaluated in the office on 05/31/2018 and felt to have significant worsening of cellulitis and was admitted to the hospital from 05/31/2018 through 06/03/2018.  He was treated empirically with intravenous vancomycin.  He underwent several venous ultrasound studies of the right lower extremity to rule out DVT all of which were negative.  He is wearing compression socks. He reports that he is very pleased with the improvement and notes that his pain is essentially resolved.  His erythema is improving and the edema is improved markedly from previous.  Assessment & Plan: Visit Diagnoses:  1. Cellulitis of right lower limb   2. Venous insufficiency (chronic) (peripheral)   3. Diabetes mellitus type 1 with manifestations ([place])     Plan: The patient was given a prescription for Vive large medical compression socks to wear around the clock except for showering.  We also discussed Achilles tendon stretching.  He is going to follow-up here on an as-needed basis.  Follow-Up Instructions: Return if symptoms worsen or fail to improve.   Ortho Exam  Patient is alert, oriented, no adenopathy, well-dressed, normal affect, normal respiratory effort. [name] right lower extremity edema is markedly improved but his right lower leg is larger than the left at 36 cm.  His erythema over both legs is improving but he still has some chronic erythematous changes.  He has no open ulcerations or other signs of infections.  He does have palpable pedal pulses.  Imaging: No  results found. No images are attached to the encounter.  Labs: Lab Results  Component Value Date   ESRSEDRATE 18 (H) 05/31/2018   CRP 2.8 (H) 05/31/2018   REPTSTATUS 06/05/2018 FINAL 05/31/2018   REPTSTATUS 06/05/2018 FINAL 05/31/2018   GRAMSTAIN NO WBC SEEN NO ORGANISMS SEEN  04/16/2018   CULT  05/31/2018    NO GROWTH 5 DAYS Performed at [facility], [place] [address], [place], [place] [address]    CULT  05/31/2018    NO GROWTH 5 DAYS Performed at [facility] [address] [place], [place] [address]      Lab Results  Component Value Date   ALBUMIN 3.8 05/31/2018   ALBUMIN 3.2 (L) 04/17/2018   ALBUMIN 3.1 (L) 04/16/2018    Body mass index is 23.82 kg/m.  Orders:  No orders of the defined types were placed in this encounter.  No orders of the defined types were placed in this encounter.    Procedures: No procedures performed  Clinical Data: No additional findings.  ROS:  All other systems negative, except as noted in the HPI. Review of Systems  Objective: Vital Signs: Ht 5\' 10"  (1.778 m)   Wt 166 lb 0.2 oz (75.3 kg)   BMI 23.82 kg/m   Specialty Comments:  No specialty comments available.  PMFS History: Patient Active Problem List   Diagnosis Date Noted  . Cellulitis  of lower extremity 06/01/2018  . Cellulitis of both lower extremities 05/31/2018  . Chronic respiratory failure with hypoxia ([place]) 05/31/2018  . HLD (hyperlipidemia) 05/31/2018  . CKD (chronic kidney disease), stage III ([place]) 05/31/2018  . Depression with anxiety 05/31/2018  . Chronic pain 05/31/2018  . Acute respiratory failure with hypoxia and hypercapnia ([place]) 04/14/2018  . Diabetes mellitus type 1 with manifestations ([place]) 04/14/2018  . Sepsis ([place]) 04/14/2018  . Postop check 10/31/2017  . Severe central sleep apnea comorbid with prescribed opioid use 10/14/2017  . Insomnia secondary to chronic pain 08/01/2017  . Contact with and (suspected) exposure to  environmental tobacco smoke (acute) (chronic) 08/01/2017  . Snoring 08/01/2017  . Venous insufficiency (chronic) (peripheral) 10/04/2016  . Essential hypertension 08/01/2013  . Chronic ulcer of left ankle ([place]) 08/01/2013  . Type I (juvenile type) diabetes mellitus with neurological manifestations, not stated as uncontrolled(250.61) 10/03/2012  . Healthcare-associated pneumonia 10/03/2012  . Dyslipidemia 10/03/2012  . Insomnia 10/03/2012  . Pain in joint, lower leg 10/02/2012  . Special screening for malignant neoplasms, colon 07/26/2010   Past Medical History:  Diagnosis Date  . Anxiety   . Arthritis   . Chronic narcotic use   . Complication of anesthesia    h/o aspiration during back surgery  . Depression   . Diabetic peripheral neuropathy ([place])   . Hearing loss    wears bilateral hearing aids, bilat moderate SNHL  . Hyperlipidemia   . Hypertension   . Insomnia   . Tobacco abuse   . Type 1 diabetes mellitus ([place])   . Ulcer of left ankle ([place])   . Vision abnormalities    mild DM retinopathy    Family History  Problem Relation Age of Onset  . Prostate cancer Paternal Uncle   . Prostate cancer Maternal Grandfather   . Diabetes Mother   . Hypertension Mother   . Diabetes Father   . Heart failure Father     Past Surgical History:  Procedure Laterality Date  . COCHLEAR IMPLANT Left 10/31/2017   Procedure: COCHLEAR IMPLANT LEFT EAR;  Surgeon: Vicie Mutters, MD;  Location: [facility];  Service: ENT;  Laterality: Left;  . COLONOSCOPY WITH PROPOFOL  07-26-2010  . [place] had 16 tons of steel rolled over his feet  . LUMBAR LAMINECTOMY/DECOMPRESSION MICRODISCECTOMY  12-23-1997   LEFT  L5 -- S1  . TONSILLECTOMY     Social History   Occupational History  . Occupation: disabled  Tobacco Use  . Smoking status: Former Smoker    Packs/day: 1.00    Years: 40.00    Pack years: 40.00    Types: Cigarettes    Last attempt to quit: 04/13/2018     Years since quitting: 0.1  . Smokeless tobacco: Never Used  Substance and Sexual Activity  . Alcohol use: No    Alcohol/week: 0.0 standard drinks  . Drug use: No  . Sexual activity: Not on file

## 2018-06-13 ENCOUNTER — Ambulatory Visit (INDEPENDENT_AMBULATORY_CARE_PROVIDER_SITE_OTHER): Payer: 59 | Admitting: Podiatry

## 2018-06-13 ENCOUNTER — Encounter: Payer: Self-pay | Admitting: Podiatry

## 2018-06-13 ENCOUNTER — Other Ambulatory Visit: Payer: Self-pay

## 2018-06-13 VITALS — Temp 98.2°F | Resp 16

## 2018-06-13 DIAGNOSIS — E0842 Diabetes mellitus due to underlying condition with diabetic polyneuropathy: Secondary | ICD-10-CM

## 2018-06-13 DIAGNOSIS — M722 Plantar fascial fibromatosis: Secondary | ICD-10-CM

## 2018-06-13 DIAGNOSIS — B351 Tinea unguium: Secondary | ICD-10-CM | POA: Diagnosis not present

## 2018-06-13 DIAGNOSIS — M79676 Pain in unspecified toe(s): Secondary | ICD-10-CM

## 2018-06-13 NOTE — Progress Notes (Signed)
Mr. Godown presents today for follow-up of his plantar fasciitis and diabetes.  He is concerned about his toenails being long and painful.  States that his fasting blood sugar today was a 97 and his hemoglobin A1c was at 6.8.  States that his big toes are painful where the nails are growing out.  And he is complaining of bilateral heel pain.  Objective: Vital signs are stable he is alert and oriented x3.  Pulses are palpable.  He has mild edema about the lower extremity.  Toenails are thick yellow dystrophic-like mycotic bilaterally.  He has pain on palpation medial [place] tubercles bilateral.  Assessment: Chronic intractable plantar fasciitis bilateral.  Diabetes mellitus.  Pain in limb secondary to onychomycosis.  Plan: Debridement of toenails 1 through 5 bilateral.  Went ahead and injected the bilateral heels today 20 mg Kenalog 5 mg Marcaine point maximal tenderness bilateral.  Discussed that he purchase a larger pair shoes so that it will stop rubbing the tip of his toes.

## 2018-06-14 ENCOUNTER — Other Ambulatory Visit: Payer: Self-pay

## 2018-06-14 ENCOUNTER — Ambulatory Visit (INDEPENDENT_AMBULATORY_CARE_PROVIDER_SITE_OTHER): Payer: 59 | Admitting: Cardiology

## 2018-06-14 ENCOUNTER — Encounter: Payer: Self-pay | Admitting: Cardiology

## 2018-06-14 VITALS — BP 152/62 | HR 69 | Temp 98.4°F | Ht 70.0 in | Wt 168.0 lb

## 2018-06-14 DIAGNOSIS — Z9981 Dependence on supplemental oxygen: Secondary | ICD-10-CM

## 2018-06-14 DIAGNOSIS — I129 Hypertensive chronic kidney disease with stage 1 through stage 4 chronic kidney disease, or unspecified chronic kidney disease: Secondary | ICD-10-CM | POA: Diagnosis not present

## 2018-06-14 DIAGNOSIS — N183 Chronic kidney disease, stage 3 (moderate): Secondary | ICD-10-CM

## 2018-06-14 DIAGNOSIS — E1122 Type 2 diabetes mellitus with diabetic chronic kidney disease: Secondary | ICD-10-CM

## 2018-06-14 DIAGNOSIS — J9611 Chronic respiratory failure with hypoxia: Secondary | ICD-10-CM

## 2018-06-14 DIAGNOSIS — I10 Essential (primary) hypertension: Secondary | ICD-10-CM

## 2018-06-14 DIAGNOSIS — R0609 Other forms of dyspnea: Secondary | ICD-10-CM | POA: Diagnosis not present

## 2018-06-14 DIAGNOSIS — R600 Localized edema: Secondary | ICD-10-CM

## 2018-06-14 DIAGNOSIS — Z794 Long term (current) use of insulin: Secondary | ICD-10-CM

## 2018-06-14 MED ORDER — FUROSEMIDE 20 MG PO TABS: 20.0000 mg | ORAL_TABLET | Freq: Every day | ORAL | 1 refills | Status: DC

## 2018-06-14 NOTE — Progress Notes (Signed)
Primary Physician:  Leanna Battles, MD   Patient ID: [name], male    DOB: 05-Oct-1955, 63 y.o.   MRN: [id number]  Subjective:    Chief Complaint  Patient presents with  . Shortness of Breath    pt c/o DOE 3 months , pt says oxegyn goes as low as 40's   . New Patient (Initial Visit)    HPI: [name]  is a 63 y.o. 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 Leanna Battles for evaluation of dyspnea on exertion.  Patient has had progressively worsening dyspnea on exertion and is now on home O2, wanted cardiology evaluation to see if his symptoms are related to his heart.   He was recently admitted on 05/21/18 for right leg cellulitis that is improving. He was also admitted in March for respiratory failure that was associated with parainfluenza pneumonia. States prior to this he was not on oxygen. For the last few weeks he has had to use oxygen continuously. States that his saturations are generally staying around 90, but due drop down into the 50's on occasion. Has chronic leg edema and is followed by Dr. Sharol Given and is stable with wearing support stockings. He has crush injury to his feet that limits his activity. Denies any chest pain, PND, or orthopnea. No symptoms of claudication. He has not been able to use CPAP due to improper fitting per patient.   He was previously seen by Dr. Gwenlyn Found in 2015 for evaluation for PAD as he had left lower extremity non-healing ulceration that healed with wound care therapy. He was not found to have any PAD by his report. Also evaluated by Dr. Percival Spanish in 2019 for preop clearance, did not undergo testing at that time due to good functional capacity and low risk procedure.   Past  Medical History:  Diagnosis Date  . Anxiety   . Arthritis   . Chronic narcotic use   . Complication of anesthesia    h/o aspiration during back surgery  . Depression   . Diabetic peripheral neuropathy ([place])   . Hearing loss    wears bilateral hearing aids, bilat moderate SNHL  . Hyperlipidemia   . Hypertension   . Insomnia   . Tobacco abuse   . Type 1 diabetes mellitus ([place])   . Ulcer of left ankle ([place])   . Vision abnormalities    mild DM retinopathy    Past Surgical History:  Procedure Laterality Date  . COCHLEAR IMPLANT Left 10/31/2017   Procedure: COCHLEAR IMPLANT LEFT EAR;  Surgeon: Vicie Mutters, MD;  Location: [place];  Service: ENT;  Laterality: Left;  . COLONOSCOPY WITH PROPOFOL  07-26-2010  . [place] had 16 tons of steel rolled over his feet  . LUMBAR LAMINECTOMY/DECOMPRESSION MICRODISCECTOMY  12-23-1997   LEFT  L5 -- S1  . TONSILLECTOMY      Social History   Socioeconomic History  . Marital status: Married    Spouse name: Not on file  . Number of children: 2  . Years of education: Not on file  . Highest education level: Not on file  Occupational History  . Occupation: disabled  Social Needs  . Financial resource strain:  Not on file  . Food insecurity:    Worry: Not on file    Inability: Not on file  . Transportation needs:    Medical: Not on file    Non-medical: Not on file  Tobacco Use  . Smoking status: Former Smoker    Packs/day: 1.00    Years: 40.00    Pack years: 40.00    Types: Cigarettes    Last attempt to quit: 04/13/2018    Years since quitting: 0.1  . Smokeless tobacco: Never Used  Substance and Sexual Activity  . Alcohol use: No    Alcohol/week: 0.0 standard drinks  . Drug use: No  . Sexual activity: Not on file  Lifestyle  . Physical activity:    Days per week: Not on file    Minutes per session: Not on file  . Stress: Not on file  Relationships  . Social connections:    Talks on phone: Not on  file    Gets together: Not on file    Attends religious service: Not on file    Active member of club or organization: Not on file    Attends meetings of clubs or organizations: Not on file    Relationship status: Not on file  . Intimate partner violence:    Fear of current or ex partner: Not on file    Emotionally abused: Not on file    Physically abused: Not on file    Forced sexual activity: Not on file  Other Topics Concern  . Not on file  Social History Narrative  . Not on file    Review of Systems  Constitution: Negative for decreased appetite, malaise/fatigue, weight gain and weight loss.  HENT: Positive for hearing loss (chronic).   Eyes: Negative for visual disturbance.  Cardiovascular: Positive for dyspnea on exertion and leg swelling. Negative for chest pain, claudication, orthopnea, palpitations and syncope.  Respiratory: Negative for hemoptysis and wheezing.   Endocrine: Negative for cold intolerance and heat intolerance.  Hematologic/Lymphatic: Does not bruise/bleed easily.  Skin: Negative for nail changes.  Musculoskeletal: Positive for back pain (intermittent). Negative for muscle weakness and myalgias.  Gastrointestinal: Negative for abdominal pain, change in bowel habit, nausea and vomiting.  Neurological: Negative for difficulty with concentration, dizziness, focal weakness and headaches.  Psychiatric/Behavioral: Negative for altered mental status and suicidal ideas.  All other systems reviewed and are negative.     Objective:  Blood pressure (!) 152/62, pulse 69, temperature 98.4 F (36.9 C), height 5\' 10"  (1.778 m), weight 168 lb (76.2 kg), SpO2 (!) 89 %. Body mass index is 24.11 kg/m.    Physical Exam  Constitutional: He is oriented to person, place, and time. Vital signs are normal. He appears well-developed and well-nourished.  HENT:  Head: Normocephalic and atraumatic.  Neck: Normal range of motion.  Cardiovascular: Normal rate, regular rhythm and  intact distal pulses.  Murmur heard.  Early systolic murmur is present with a grade of 2/6 at the upper right sternal border. Pulses:      Carotid pulses are on the right side with bruit and on the left side with bruit.      Popliteal pulses are 2+ on the right side and 2+ on the left side.       Dorsalis pedis pulses are 2+ on the right side and 2+ on the left side.       Posterior tibial pulses are 2+ on the right side and 2+ on the left side.  [place]  spots on both big toes with calluous. Likely related to wearing improper fitting shoes  Chronic cellulitis changes to bilateral LE  Pulmonary/Chest: Effort normal. No accessory muscle usage. No respiratory distress. He has decreased breath sounds in the right lower field and the left lower field.  Barrel shaped  Abdominal: Soft. Bowel sounds are normal.  Musculoskeletal: Normal range of motion.  Neurological: He is alert and oriented to person, place, and time.  Skin: Skin is warm and dry.  Vitals reviewed.  Radiology: CT of chest 05/27/2018: 1. No pulmonary embolism. 2. Minimal pulmonary edema. 3. Tiny right pleural effusion and mild right base atelectasis.  4. Mild mediastinal and bilateral hilar lymph node enlargement. This is of uncertain etiology but may be reactive.  CXR 05/27/2018: Question minimal right basilar infiltrate and pleural effusion.   Laboratory examination:    CMP Latest Ref Rng & Units 06/02/2018 06/01/2018 05/31/2018  Glucose 70 - 99 mg/dL 81 87 156(H)  BUN 8 - 23 mg/dL 19 19 20   Creatinine 0.61 - 1.24 mg/dL 1.24 1.32(H) 1.31(H)  Sodium 135 - 145 mmol/L 136 138 138  Potassium 3.5 - 5.1 mmol/L 4.2 4.2 4.6  Chloride 98 - 111 mmol/L 102 103 100  CO2 22 - 32 mmol/L 28 27 27   Calcium 8.9 - 10.3 mg/dL 8.4(L) 8.3(L) 9.3  Total Protein 6.5 - 8.1 g/dL - - 7.3  Total Bilirubin 0.3 - 1.2 mg/dL - - 0.6  Alkaline Phos 38 - 126 U/L - - 86  AST 15 - 41 U/L - - 14(L)  ALT 0 - 44 U/L - - 10   CBC Latest Ref Rng & Units  06/02/2018 06/01/2018 05/31/2018  WBC 4.0 - 10.5 K/uL 3.6(L) 3.5(L) 5.1  Hemoglobin 13.0 - 17.0 g/dL 8.4(L) 8.5(L) 10.2(L)  Hematocrit 39.0 - 52.0 % 26.7(L) 27.3(L) 31.9(L)  Platelets 150 - 400 K/uL 173 156 198   Lipid Panel  No results found for: CHOL, TRIG, HDL, CHOLHDL, VLDL, LDLCALC, LDLDIRECT HEMOGLOBIN A1C No results found for: HGBA1C, MPG TSH No results for input(s): TSH in the last 8760 hours.  PRN Meds:. There are no discontinued medications. No outpatient medications have been marked as taking for the 06/14/18 encounter (Office Visit) with Miquel Dunn, NP.    Cardiac Studies:     Assessment:   Dyspnea on exertion  Controlled type 2 diabetes mellitus with stage 3 chronic kidney disease, with long-term current use of insulin (HCC)  Chronic respiratory failure with hypoxia, on home oxygen therapy ([place])  EKG 05/31/2018: Sinus bradycardia at 53 bpm, normal axis, no evidence of ischemia.   Recommendations:   Patient with longstanding history of previously uncontrolled diabetes, not well controlled, heavy tobacco use disorder, centrilobular emphysema, chronic lower extremity ulceration probably related to diabetes mellitus, chronic bilateral leg edema which is now resolved, latest admission to the hospital in February 2020 with pneumonia and influenza and since then has completely quit smoking.  He has hyperlipidemia and hypertension.  Referred to [place] for evaluation of dyspnea, due to his underlying medical comorbidity, he has not had any cardiac evaluation in the past, we will schedule him for [facility] stress test as he is unable to exercise due to dyspnea.    We will also obtain an echocardiogram.  He is blood pressure is well controlled, he is presently on a statin, he is on aspirin and amlodipine and also on simvastatin, could certainly consider switching to Crestor to avoid interaction of amlodipine with simvastatin.  In view of  pleural effusion and mild  pulmonary edema on recent CT scan of chest, will start lasix 20 mg daily. Patient is currently on potassium supplement. Will check BMP in 10 days for surveillance. I will see him back after the test and make further recommendations.  With regard to lower extremity evaluation of PAD, he has good bounding pulses in the lower extremity, he does have chronic dermatitis changes in his lower extremity probably related to frequent cellulitis from chronic leg edema.  Since weight loss, making diet changes and smoking cessation and support stockings, leg edema is completely resolved.  He has no ulceration in his feet.  He does have a traumatic callus and a very tiny eschar in the tips of the bilateral greater toe.  Do not suspect ischemic change.  I like to see him back after the test.   *I have discussed this case with Dr. Einar Gip and he personally examined the patient and participated in formulating the plan.*   Miquel Dunn, MSN, APRN, FNP-C [facility]. [place] Office: [phone] Fax: [phone]

## 2018-06-15 ENCOUNTER — Telehealth: Payer: Self-pay | Admitting: *Deleted

## 2018-06-15 DIAGNOSIS — Z20822 Contact with and (suspected) exposure to covid-19: Secondary | ICD-10-CM

## 2018-06-15 NOTE — Telephone Encounter (Addendum)
Pt called on mobile number but no answer and unable to leave voicemail message due to mailbox not being set up.  Pt called on home number and pt notified of potential exposure during recent visit. Call was disconnected before scheduling testing due to phone failure of RN.

## 2018-06-15 NOTE — Telephone Encounter (Signed)
Patient's wife called back, advised that he was potentially exposed to COVID-19 at the [place] on 06/07/18 at [facility] and we are offering free testing if agreeable. She says yes to the testing, appointment scheduled for tomorrow, 06/16/18 at 1315 at [facility], advised of location and for all to wear a mask in the vehicle, she verbalized understanding.

## 2018-06-16 ENCOUNTER — Other Ambulatory Visit: Payer: 59

## 2018-06-16 DIAGNOSIS — Z20822 Contact with and (suspected) exposure to covid-19: Secondary | ICD-10-CM

## 2018-06-16 NOTE — Addendum Note (Signed)
Addended by: Dimple Nanas on: 06/16/2018 11:40 AM   Modules accepted: Orders

## 2018-06-17 LAB — NOVEL CORONAVIRUS, NAA: SARS-CoV-2, NAA: NOT DETECTED

## 2018-06-18 ENCOUNTER — Telehealth: Payer: Self-pay | Admitting: Neurology

## 2018-06-18 NOTE — Telephone Encounter (Signed)
Called the patient's wife to discuss. There was no answer. Left a detailed message informing her that I in fact did send the order and that the last one was sent on may 6th,2020. I remember because I had to put 3-4 different orders in because they stated I had to correct the order multiple times, so I do know that [facility] received the order. Informed the patient that they will need to push his apt out. He has a follow up for July 8,2020. With Bipap machine he will have to started the machine and used it for 61-90 days. Advised the patient's wife to call back and push the apt out. I also informed her that I contacted the [facility] manager and advised them to expedite his status. LVM stating if the pt hasn't gotten called tomorrow to let us know.   Please push the apt out to end of august for initial Bipap follow up

## 2018-06-18 NOTE — Telephone Encounter (Signed)
Pt's wife called stating that she has not received an update on the bipap fitting the pt was to have. She called [facility] and they informed her that they are needing a prescription sent to the for the bipap and that they have no record of a request for it. Please advise.

## 2018-07-05 ENCOUNTER — Ambulatory Visit (INDEPENDENT_AMBULATORY_CARE_PROVIDER_SITE_OTHER): Payer: 59 | Admitting: Pulmonary Disease

## 2018-07-05 ENCOUNTER — Encounter: Payer: Self-pay | Admitting: Pulmonary Disease

## 2018-07-05 ENCOUNTER — Other Ambulatory Visit: Payer: Self-pay

## 2018-07-05 ENCOUNTER — Ambulatory Visit (INDEPENDENT_AMBULATORY_CARE_PROVIDER_SITE_OTHER): Payer: 59

## 2018-07-05 VITALS — BP 136/64 | HR 79 | Ht 69.0 in | Wt 167.8 lb

## 2018-07-05 DIAGNOSIS — R0602 Shortness of breath: Secondary | ICD-10-CM

## 2018-07-05 MED ORDER — ANORO ELLIPTA 62.5-25 MCG/INH IN AEPB: 1.0000 | INHALATION_SPRAY | Freq: Every day | RESPIRATORY_TRACT | 5 refills | Status: DC

## 2018-07-05 MED ORDER — ANORO ELLIPTA 62.5-25 MCG/INH IN AEPB: 1.0000 | INHALATION_SPRAY | Freq: Every day | RESPIRATORY_TRACT | 0 refills | Status: DC

## 2018-07-05 NOTE — Patient Instructions (Signed)
Start using the Anoro.  We will send in a prescription so that you can continue to use it without interruption Start using the BiPAP.  Please follow-up with your sleep doctor about adjustment of pressure We will schedule you for pulmonary function test We will get chest x-ray today to make sure the pneumonia has cleared up Continues on supplemental oxygen  Follow-up in 2 to 3 months.

## 2018-07-05 NOTE — Progress Notes (Signed)
[name]    [id number]    09/10/1955  Primary Care Physician:Paterson, Quillian Quince, MD  Referring Physician: Leanna Battles, [address] [place],  [place] [address]  Chief complaint: Consult for COPD  HPI: 63 year old heavy ex-smoker with history of anxiety, chronic narcotic use, severe central sleep apnea, diabetes, peripheral neuropathy, deafness, chronic pain on opiates  Admitted in March 2020 for acute on chronic respiratory failure.  Diagnosed with parainfluenza virus infection and multilobar pneumonia.  Treated with ceftriaxone and azithromycin. Admitted again in May 2020 for cellulitis of right lower extremity.  COVID was negative.  He is on 2 L oxygen since admission in March.  He has Anoro inhaler but is not using it yet History of severe central sleep apnea and is on BiPAP.  Follows with Dr. Brett Fairy  Pets: Has 2 dogs.  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  Quit in February 2020 Travel history: No significant travel history Relevant family history: No significant family history of lung disease  Outpatient Encounter Medications as of 07/05/2018  Medication Sig  . ALPRAZolam (XANAX) 0.5 MG tablet Take 0.5 mg by mouth at bedtime as needed for anxiety.  Marland Kitchen amLODipine (NORVASC) 5 MG tablet Take 5 mg by mouth daily.  Marland Kitchen aspirin EC 81 MG tablet Take 81 mg by mouth daily.  . benzonatate (TESSALON) 100 MG capsule Take 100 mg by mouth 2 (two) times daily as needed for cough.   . divalproex (DEPAKOTE) 250 MG DR tablet Take 250 mg by mouth daily.  Marland Kitchen doxepin (SINEQUAN) 25 MG capsule Take 25 mg by mouth at bedtime.  . furosemide (LASIX) 20 MG tablet Take 1 tablet (20 mg total) by mouth daily.  Marland Kitchen gabapentin (NEURONTIN) 300 MG capsule Take 300 mg by mouth 3 (three) times daily.   Marland Kitchen glipiZIDE (GLUCOTROL XL) 2.5 MG 24 hr  tablet Take 2.5-5 mg by mouth See admin instructions. Take 2 tablets (5 mg) by mouth with breakfast and 1 tablet (2.5 mg) with supper  . hydrochlorothiazide (HYDRODIURIL) 25 MG tablet Take 25 mg by mouth daily.  Marland Kitchen HYDROcodone-acetaminophen (NORCO/VICODIN) 5-325 MG tablet Take 1 tablet by mouth every 6 (six) hours as needed for moderate pain.   . Insulin Glargine (BASAGLAR KWIKPEN) 100 UNIT/ML SOPN Inject 16 Units into the skin daily. In morning  . losartan (COZAAR) 100 MG tablet Take 100 mg by mouth daily.  . metFORMIN (GLUCOPHAGE-XR) 500 MG 24 hr tablet Take 500 mg by mouth 2 (two) times a day.   . methadone (DOLOPHINE) 5 MG tablet Take 10 mg by mouth 4 (four) times daily.   . metoprolol tartrate (LOPRESSOR) 25 MG tablet Take 25-50 mg by mouth See admin instructions. Take 2 tablets (50 mg) by mouth every morning and 1 tablet (25 mg) at bedtime  . nortriptyline (PAMELOR) 25 MG capsule Take 25 mg by mouth at bedtime.    . OXYGEN Inhale into the lungs. 3 liters  . potassium citrate (UROCIT-K) 10 MEQ (1080 MG) SR tablet Take 10 mEq by mouth daily.  . sertraline (ZOLOFT) 50 MG tablet Take 50 mg by mouth at bedtime.   . simvastatin (ZOCOR) 20 MG tablet Take 20 mg by mouth at bedtime.    Marland Kitchen umeclidinium-vilanterol (ANORO ELLIPTA) 62.5-25 MCG/INH AEPB Inhale 1 puff into the lungs daily.   No facility-administered encounter medications  on file as of 07/05/2018.     Allergies as of 07/05/2018 - Review Complete 07/05/2018  Allergen Reaction Noted  . Codeine Nausea And Vomiting 04/25/2010  . Tetracyclines & related Hives 04/25/2010    Past Medical History:  Diagnosis Date  . Anxiety   . Arthritis   . Chronic narcotic use   . Complication of anesthesia    h/o aspiration during back surgery  . Depression   . Diabetic peripheral neuropathy ([place])   . Hearing loss    wears bilateral hearing aids, bilat moderate SNHL  . Hyperlipidemia   . Hypertension   . Insomnia   . Tobacco abuse   . Type 1  diabetes mellitus ([place])   . Ulcer of left ankle ([place])   . Vision abnormalities    mild DM retinopathy    Past Surgical History:  Procedure Laterality Date  . COCHLEAR IMPLANT Left 10/31/2017   Procedure: COCHLEAR IMPLANT LEFT EAR;  Surgeon: Vicie Mutters, MD;  Location: [place];  Service: ENT;  Laterality: Left;  . COLONOSCOPY WITH PROPOFOL  07-26-2010  . [place] had 16 tons of steel rolled over his feet  . LUMBAR LAMINECTOMY/DECOMPRESSION MICRODISCECTOMY  12-23-1997   LEFT  L5 -- S1  . TONSILLECTOMY      Family History  Problem Relation Age of Onset  . Prostate cancer Paternal Uncle   . Prostate cancer Maternal Grandfather   . Diabetes Mother   . Hypertension Mother   . Diabetes Father   . Heart failure Father     Social History   Socioeconomic History  . Marital status: Married    Spouse name: Not on file  . Number of children: 2  . Years of education: Not on file  . Highest education level: Not on file  Occupational History  . Occupation: disabled  Social Needs  . Financial resource strain: Not on file  . Food insecurity    Worry: Not on file    Inability: Not on file  . Transportation needs    Medical: Not on file    Non-medical: Not on file  Tobacco Use  . Smoking status: Former Smoker    Packs/day: 1.00    Years: 40.00    Pack years: 40.00    Types: Cigarettes    Quit date: 02/16/2018    Years since quitting: 0.3  . Smokeless tobacco: Never Used  Substance and Sexual Activity  . Alcohol use: No    Alcohol/week: 0.0 standard drinks  . Drug use: No  . Sexual activity: Not on file  Lifestyle  . Physical activity    Days per week: Not on file    Minutes per session: Not on file  . Stress: Not on file  Relationships  . Social Herbalist on phone: Not on file    Gets together: Not on file    Attends religious service: Not on file    Active member of club or organization: Not on file    Attends meetings of  clubs or organizations: Not on file    Relationship status: Not on file  . Intimate partner violence    Fear of current or ex partner: Not on file    Emotionally abused: Not on file    Physically abused: Not on file    Forced sexual activity: Not on file  Other Topics Concern  . Not on file  Social History Narrative  . Not on  file    Review of systems: Review of Systems  Constitutional: Negative for fever and chills.  HENT: Negative.   Eyes: Negative for blurred vision.  Respiratory: as per HPI  Cardiovascular: Negative for chest pain and palpitations.  Gastrointestinal: Negative for vomiting, diarrhea, blood per rectum. Genitourinary: Negative for dysuria, urgency, frequency and hematuria.  Musculoskeletal: Negative for myalgias, back pain and joint pain.  Skin: Negative for itching and rash.  Neurological: Negative for dizziness, tremors, focal weakness, seizures and loss of consciousness.  Endo/Heme/Allergies: Negative for environmental allergies.  Psychiatric/Behavioral: Negative for depression, suicidal ideas and hallucinations.  All other systems reviewed and are negative.  Physical Exam: Blood pressure 136/64, pulse 79, height 5\' 9"  (1.753 m), weight 167 lb 12.8 oz (76.1 kg), SpO2 (!) 88 %. Gen:      No acute distress HEENT:  EOMI, sclera anicteric Neck:     No masses; no thyromegaly Lungs:    Clear to auscultation bilaterally; normal respiratory effort CV:         Regular rate and rhythm; no murmurs Abd:      + bowel sounds; soft, non-tender; no palpable masses, no distension Ext:    No edema; adequate peripheral perfusion Skin:      Warm and dry; no rash Neuro: alert and oriented x 3 Psych: normal mood and affect  Data Reviewed: Imaging: Chest x-ray 04/14/2018-in different parts opacities in the right upper and lower lobes.  Reviewed images personally  Labs: CBC 05/01/2018-WBC 5.1, eos 3%, absolute eosinophil count 153  Assessment:  Dyspnea Likely of COPD  given smoking history I have asked him to start using the Anoro.  He has inhaler at home but is not using it. Continue supplemental oxygen Schedule for pulmonary function tests We will get a chest x-ray today to make sure previously noted infiltrates from March have cleared  Severe central sleep apnea Intolerant of BiPAP.  He has follow-up with his sleep doctor  Plan/Recommendations: - Start using Anoro - Continue supplemental oxygen - Pulmonary function test, chest x-ray  Marshell Garfinkel MD [facility] 07/05/2018, 2:55 PM  CC: Leanna Battles, MD

## 2018-07-08 ENCOUNTER — Other Ambulatory Visit: Payer: Self-pay

## 2018-07-08 ENCOUNTER — Ambulatory Visit (INDEPENDENT_AMBULATORY_CARE_PROVIDER_SITE_OTHER): Payer: 59

## 2018-07-08 DIAGNOSIS — R0609 Other forms of dyspnea: Secondary | ICD-10-CM | POA: Diagnosis not present

## 2018-07-08 DIAGNOSIS — I10 Essential (primary) hypertension: Secondary | ICD-10-CM

## 2018-07-09 ENCOUNTER — Other Ambulatory Visit: Payer: Self-pay | Admitting: Cardiology

## 2018-07-09 NOTE — Progress Notes (Signed)
Unable to leave vm.

## 2018-07-16 DIAGNOSIS — E1151 Type 2 diabetes mellitus with diabetic peripheral angiopathy without gangrene: Secondary | ICD-10-CM | POA: Diagnosis not present

## 2018-07-16 DIAGNOSIS — I739 Peripheral vascular disease, unspecified: Secondary | ICD-10-CM | POA: Diagnosis not present

## 2018-07-16 DIAGNOSIS — Z794 Long term (current) use of insulin: Secondary | ICD-10-CM | POA: Diagnosis not present

## 2018-07-16 DIAGNOSIS — N183 Chronic kidney disease, stage 3 (moderate): Secondary | ICD-10-CM | POA: Diagnosis not present

## 2018-07-16 DIAGNOSIS — Z1339 Encounter for screening examination for other mental health and behavioral disorders: Secondary | ICD-10-CM | POA: Diagnosis not present

## 2018-07-16 DIAGNOSIS — G4737 Central sleep apnea in conditions classified elsewhere: Secondary | ICD-10-CM | POA: Diagnosis not present

## 2018-07-16 DIAGNOSIS — M545 Low back pain: Secondary | ICD-10-CM | POA: Diagnosis not present

## 2018-07-16 DIAGNOSIS — Z0000 Encounter for general adult medical examination without abnormal findings: Secondary | ICD-10-CM | POA: Diagnosis not present

## 2018-07-16 DIAGNOSIS — I129 Hypertensive chronic kidney disease with stage 1 through stage 4 chronic kidney disease, or unspecified chronic kidney disease: Secondary | ICD-10-CM | POA: Diagnosis not present

## 2018-07-16 DIAGNOSIS — J449 Chronic obstructive pulmonary disease, unspecified: Secondary | ICD-10-CM | POA: Diagnosis not present

## 2018-07-16 DIAGNOSIS — H9193 Unspecified hearing loss, bilateral: Secondary | ICD-10-CM | POA: Diagnosis not present

## 2018-07-16 DIAGNOSIS — R2681 Unsteadiness on feet: Secondary | ICD-10-CM | POA: Diagnosis not present

## 2018-07-17 ENCOUNTER — Institutional Professional Consult (permissible substitution): Payer: 59 | Admitting: Pulmonary Disease

## 2018-07-18 ENCOUNTER — Telehealth: Payer: Self-pay | Admitting: Pulmonary Disease

## 2018-07-22 NOTE — Telephone Encounter (Signed)
Pt still has not been able to be set up for PFT and follow up. Message is already being handled by Sharl Ma so will leave encounter open for her to follow up on.

## 2018-07-23 ENCOUNTER — Telehealth: Payer: Self-pay

## 2018-07-23 ENCOUNTER — Other Ambulatory Visit: Payer: Self-pay

## 2018-07-23 ENCOUNTER — Ambulatory Visit (INDEPENDENT_AMBULATORY_CARE_PROVIDER_SITE_OTHER): Payer: 59

## 2018-07-23 DIAGNOSIS — R0609 Other forms of dyspnea: Secondary | ICD-10-CM

## 2018-07-23 DIAGNOSIS — I10 Essential (primary) hypertension: Secondary | ICD-10-CM

## 2018-07-23 NOTE — Telephone Encounter (Signed)
Called and scheduled patient for PFT on 09/04/2018-pr

## 2018-07-23 NOTE — Telephone Encounter (Signed)
PTs wife [name]  call to state her husband is not tolerating the bipap machine. She states her husband is claustophic and feels like the pressure is too high. He has tried on several bipap mask and has two at home. She would like to know what can be done. She wants to know can the pressure be decrease.The wife stated its not about the fit but the air pressure. She can be reach at [phone].

## 2018-07-24 ENCOUNTER — Ambulatory Visit: Payer: Self-pay | Admitting: Adult Health

## 2018-07-24 NOTE — Telephone Encounter (Signed)
Called the wife to discuss, there was no answer and mailbox was full was unable to leave a message. Will attempt again.

## 2018-07-30 ENCOUNTER — Other Ambulatory Visit: Payer: Self-pay

## 2018-07-30 ENCOUNTER — Encounter: Payer: Self-pay | Admitting: Cardiology

## 2018-07-30 ENCOUNTER — Ambulatory Visit (INDEPENDENT_AMBULATORY_CARE_PROVIDER_SITE_OTHER): Payer: 59 | Admitting: Cardiology

## 2018-07-30 VITALS — BP 156/66 | HR 76 | Ht 69.0 in | Wt 173.0 lb

## 2018-07-30 DIAGNOSIS — I129 Hypertensive chronic kidney disease with stage 1 through stage 4 chronic kidney disease, or unspecified chronic kidney disease: Secondary | ICD-10-CM

## 2018-07-30 DIAGNOSIS — J9611 Chronic respiratory failure with hypoxia: Secondary | ICD-10-CM

## 2018-07-30 DIAGNOSIS — R600 Localized edema: Secondary | ICD-10-CM

## 2018-07-30 DIAGNOSIS — Z794 Long term (current) use of insulin: Secondary | ICD-10-CM

## 2018-07-30 DIAGNOSIS — R0609 Other forms of dyspnea: Secondary | ICD-10-CM | POA: Diagnosis not present

## 2018-07-30 DIAGNOSIS — E1122 Type 2 diabetes mellitus with diabetic chronic kidney disease: Secondary | ICD-10-CM | POA: Diagnosis not present

## 2018-07-30 DIAGNOSIS — I10 Essential (primary) hypertension: Secondary | ICD-10-CM

## 2018-07-30 DIAGNOSIS — N183 Chronic kidney disease, stage 3 (moderate): Secondary | ICD-10-CM

## 2018-07-30 DIAGNOSIS — Z9981 Dependence on supplemental oxygen: Secondary | ICD-10-CM

## 2018-07-30 NOTE — Progress Notes (Signed)
Primary Physician:  Leanna Battles, MD   Patient ID: [name], male    DOB: 08-12-1955, 63 y.o.   MRN: [id number]  Subjective:    Chief Complaint  Patient presents with  . Hypertension  . Shortness of Breath  . Follow-up   This visit type was conducted due to national recommendations for restrictions regarding the COVID-19 Pandemic (e.g. social distancing).  This format is felt to be most appropriate for this patient at this time.  All issues noted in this document were discussed and addressed.  No physical exam was performed (except for noted visual exam findings with Telehealth visits).  The patient has consented to conduct a Telehealth visit and understands insurance will be billed.   I discussed the limitations of evaluation and management by telemedicine and the availability of in person appointments. The patient expressed understanding and agreed to proceed.  Virtual Visit via Video Note is as below  I connected with Mr. Whalley, on 07/30/18 at 1300 by telephone and verified that I am speaking with the correct person using two identifiers. Unable to perform video visit as patient did not have equipment. Wife was present for our conversation and actually performed most of the conversation as patient is hard of hearing.    I have discussed with the patient regarding the safety during COVID Pandemic and steps and precautions including social distancing with the patient.    HPI: [name]  is a 63 y.o. 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   Has chronic leg edema and is followed by Dr. Sharol Given and is stable with wearing support stockings. He has crush injury to his feet that limits  his activity. Denies any chest pain, PND, or orthopnea. No symptoms of claudication. He has not been able to use CPAP due to improper fitting per patient.   Patient had been in the hospital in March for respiratory failure associated with parainfluenza pneumonia.  He has had progressively worsening dyspnea on exertion and is now requiring oxygen therapy and wanted cardiac evaluation for possible etiology.  He was noted to have mild pulmonary edema on CT scan, and was started on Lasix.  He underwent Lexiscan nuclear stress testing as well as echocardiogram and now presents for follow-up.  Oxygen level has been stable and he has not had to use oxygen recently, except for with over exertion for a few minutes. Wife states that lasix has helped his shortness of breath and leg swelling. No chest pain. He has been evaluated by Dr. Vaughan Browner for management of COPD. Overall, patient feels that he is doing much better.  He was previously seen by Dr. Gwenlyn Found in 2015 for evaluation for PAD. He has normal vascular exam and dermatitis changes are felt to be related to chronic leg swelling.  Past Medical History:  Diagnosis Date  . Anxiety   . Arthritis   . Chronic narcotic use   . Complication of anesthesia    h/o aspiration during back surgery  . Depression   . Diabetic peripheral neuropathy ([place])   . Hearing loss    wears bilateral hearing aids, bilat moderate SNHL  . Hyperlipidemia   . Hypertension   . Insomnia   . Tobacco  abuse   . Type 1 diabetes mellitus ([place])   . Ulcer of left ankle ([place])   . Vision abnormalities    mild DM retinopathy    Past Surgical History:  Procedure Laterality Date  . COCHLEAR IMPLANT Left 10/31/2017   Procedure: COCHLEAR IMPLANT LEFT EAR;  Surgeon: Vicie Mutters, MD;  Location: [facility];  Service: ENT;  Laterality: Left;  . COLONOSCOPY WITH PROPOFOL  07-26-2010  . [place] had 16 tons of steel rolled over his feet  . LUMBAR  LAMINECTOMY/DECOMPRESSION MICRODISCECTOMY  12-23-1997   LEFT  L5 -- S1  . TONSILLECTOMY      Social History   Socioeconomic History  . Marital status: Married    Spouse name: Not on file  . Number of children: 2  . Years of education: Not on file  . Highest education level: Not on file  Occupational History  . Occupation: disabled  Social Needs  . Financial resource strain: Not on file  . Food insecurity    Worry: Not on file    Inability: Not on file  . Transportation needs    Medical: Not on file    Non-medical: Not on file  Tobacco Use  . Smoking status: Former Smoker    Packs/day: 1.00    Years: 40.00    Pack years: 40.00    Types: Cigarettes    Quit date: 02/16/2018    Years since quitting: 0.4  . Smokeless tobacco: Never Used  Substance and Sexual Activity  . Alcohol use: No    Alcohol/week: 0.0 standard drinks  . Drug use: No  . Sexual activity: Not on file  Lifestyle  . Physical activity    Days per week: Not on file    Minutes per session: Not on file  . Stress: Not on file  Relationships  . Social Herbalist on phone: Not on file    Gets together: Not on file    Attends religious service: Not on file    Active member of club or organization: Not on file    Attends meetings of clubs or organizations: Not on file    Relationship status: Not on file  . Intimate partner violence    Fear of current or ex partner: Not on file    Emotionally abused: Not on file    Physically abused: Not on file    Forced sexual activity: Not on file  Other Topics Concern  . Not on file  Social History Narrative  . Not on file    Review of Systems  Constitution: Negative for decreased appetite, malaise/fatigue, weight gain and weight loss.  HENT: Positive for hearing loss (chronic).   Eyes: Negative for visual disturbance.  Cardiovascular: Positive for dyspnea on exertion (improved) and leg swelling (improved; chronic). Negative for chest pain, claudication,  orthopnea, palpitations and syncope.  Respiratory: Negative for hemoptysis and wheezing.   Endocrine: Negative for cold intolerance and heat intolerance.  Hematologic/Lymphatic: Does not bruise/bleed easily.  Skin: Negative for nail changes.  Musculoskeletal: Positive for back pain (intermittent). Negative for muscle weakness and myalgias.  Gastrointestinal: Negative for abdominal pain, change in bowel habit, nausea and vomiting.  Neurological: Negative for difficulty with concentration, dizziness, focal weakness and headaches.  Psychiatric/Behavioral: Negative for altered mental status and suicidal ideas.  All other systems reviewed and are negative.     Objective:  Blood pressure (!) 156/66, pulse 76, height 5\' 9"  (1.753 m),  weight 173 lb (78.5 kg). Body mass index is 25.55 kg/m.    Physical exam not performed or limited due to virtual visit.    Please see exam details from prior visit is as below.   Physical Exam  Constitutional: He is oriented to person, place, and time. Vital signs are normal. He appears well-developed and well-nourished.  HENT:  Head: Normocephalic and atraumatic.  Neck: Normal range of motion.  Cardiovascular: Normal rate, regular rhythm and intact distal pulses.  Murmur heard.  Early systolic murmur is present with a grade of 2/6 at the upper right sternal border. Pulses:      Carotid pulses are on the right side with bruit and on the left side with bruit.      Popliteal pulses are 2+ on the right side and 2+ on the left side.       Dorsalis pedis pulses are 2+ on the right side and 2+ on the left side.       Posterior tibial pulses are 2+ on the right side and 2+ on the left side.  Brown spots on both big toes with calluous. Likely related to wearing improper fitting shoes  Chronic cellulitis changes to bilateral LE  Pulmonary/Chest: Effort normal. No accessory muscle usage. No respiratory distress. He has decreased breath sounds in the right lower field  and the left lower field.  Barrel shaped  Abdominal: Soft. Bowel sounds are normal.  Musculoskeletal: Normal range of motion.  Neurological: He is alert and oriented to person, place, and time.  Skin: Skin is warm and dry.  Vitals reviewed.  Radiology: CT of chest 05/27/2018: 1. No pulmonary embolism. 2. Minimal pulmonary edema. 3. Tiny right pleural effusion and mild right base atelectasis.  4. Mild mediastinal and bilateral hilar lymph node enlargement. This is of uncertain etiology but may be reactive.  CXR 05/27/2018: Question minimal right basilar infiltrate and pleural effusion.   Laboratory examination:    CMP Latest Ref Rng & Units 06/02/2018 06/01/2018 05/31/2018  Glucose 70 - 99 mg/dL 81 87 156(H)  BUN 8 - 23 mg/dL 19 19 20   Creatinine 0.61 - 1.24 mg/dL 1.24 1.32(H) 1.31(H)  Sodium 135 - 145 mmol/L 136 138 138  Potassium 3.5 - 5.1 mmol/L 4.2 4.2 4.6  Chloride 98 - 111 mmol/L 102 103 100  CO2 22 - 32 mmol/L 28 27 27   Calcium 8.9 - 10.3 mg/dL 8.4(L) 8.3(L) 9.3  Total Protein 6.5 - 8.1 g/dL - - 7.3  Total Bilirubin 0.3 - 1.2 mg/dL - - 0.6  Alkaline Phos 38 - 126 U/L - - 86  AST 15 - 41 U/L - - 14(L)  ALT 0 - 44 U/L - - 10   CBC Latest Ref Rng & Units 06/02/2018 06/01/2018 05/31/2018  WBC 4.0 - 10.5 K/uL 3.6(L) 3.5(L) 5.1  Hemoglobin 13.0 - 17.0 g/dL 8.4(L) 8.5(L) 10.2(L)  Hematocrit 39.0 - 52.0 % 26.7(L) 27.3(L) 31.9(L)  Platelets 150 - 400 K/uL 173 156 198   Lipid Panel  No results found for: CHOL, TRIG, HDL, CHOLHDL, VLDL, LDLCALC, LDLDIRECT HEMOGLOBIN A1C No results found for: HGBA1C, MPG TSH No results for input(s): TSH in the last 8760 hours.  PRN Meds:. Medications Discontinued During This Encounter  Medication Reason  . benzonatate (TESSALON) 100 MG capsule Error  . umeclidinium-vilanterol (ANORO ELLIPTA) 62.5-25 MCG/INH AEPB Error  . umeclidinium-vilanterol (ANORO ELLIPTA) 62.5-25 MCG/INH AEPB Error   Current Meds  Medication Sig  . ALPRAZolam (XANAX)  0.5 MG tablet Take 0.5  mg by mouth at bedtime as needed for anxiety.  Marland Kitchen amLODipine (NORVASC) 5 MG tablet Take 5 mg by mouth daily.  Marland Kitchen aspirin EC 81 MG tablet Take 81 mg by mouth daily.  . divalproex (DEPAKOTE) 250 MG DR tablet Take 250 mg by mouth daily.  Marland Kitchen doxepin (SINEQUAN) 25 MG capsule Take 25 mg by mouth at bedtime.  . furosemide (LASIX) 20 MG tablet TAKE 1 TABLET BY MOUTH EVERY DAY  . gabapentin (NEURONTIN) 300 MG capsule Take 300 mg by mouth 3 (three) times daily.   Marland Kitchen glipiZIDE (GLUCOTROL XL) 2.5 MG 24 hr tablet Take 2.5-5 mg by mouth See admin instructions. Take 2 tablets (5 mg) by mouth with breakfast and 1 tablet (2.5 mg) with supper  . hydrochlorothiazide (HYDRODIURIL) 25 MG tablet Take 25 mg by mouth daily.  Marland Kitchen HYDROcodone-acetaminophen (NORCO/VICODIN) 5-325 MG tablet Take 1 tablet by mouth every 6 (six) hours as needed for moderate pain.   . Insulin Glargine (BASAGLAR KWIKPEN) 100 UNIT/ML SOPN Inject 16 Units into the skin daily. In morning  . losartan (COZAAR) 100 MG tablet Take 100 mg by mouth daily.  . metFORMIN (GLUCOPHAGE-XR) 500 MG 24 hr tablet Take 500 mg by mouth 2 (two) times a day.   . methadone (DOLOPHINE) 5 MG tablet Take 10 mg by mouth 4 (four) times daily.   . metoprolol tartrate (LOPRESSOR) 25 MG tablet Take 25-50 mg by mouth See admin instructions. Take 2 tablets (50 mg) by mouth every morning and 1 tablet (25 mg) at bedtime  . nortriptyline (PAMELOR) 25 MG capsule Take 25 mg by mouth at bedtime.    . OXYGEN Inhale into the lungs. 3 liters  . potassium citrate (UROCIT-K) 10 MEQ (1080 MG) SR tablet Take 10 mEq by mouth daily.  . sertraline (ZOLOFT) 50 MG tablet Take 50 mg by mouth at bedtime.   . simvastatin (ZOCOR) 20 MG tablet Take 20 mg by mouth at bedtime.    Marland Kitchen umeclidinium-vilanterol (ANORO ELLIPTA) 62.5-25 MCG/INH AEPB Inhale 1 puff into the lungs daily.    Cardiac Studies:   Echocardiogram 07/23/2018: Normal LV systolic function with EF 65%. Left ventricle  cavity is normal in size. Mild concentric hypertrophy of the left ventricle. Normal global wall motion. Normal diastolic filling pattern. Calculated EF 65%. Trace aortic regurgitation. Mild (Grade I) mitral regurgitation. Mild tricuspid regurgitation. Estimated pulmonary artery systolic pressure is 30 mmHg.   Lexiscan Sestamibi Stress Test 07/08/2018: Stress EKG is non-diagnostic, as this is pharmacological stress test. Myocardial pefusion imaging is normal. Left ventricular ejection fraction is  63% with normal wall motion. Low risk study.  Assessment:     ICD-10-CM   1. Dyspnea on exertion  R06.09   2. Chronic respiratory failure with hypoxia, on home oxygen therapy (HCC)  J96.11    Z99.81   3. Controlled type 2 diabetes mellitus with stage 3 chronic kidney disease, with long-term current use of insulin (HCC)  E11.22    N18.3    Z79.4   4. Essential hypertension  I10   5. Bilateral leg edema  R60.0     EKG 05/31/2018: Sinus bradycardia at 53 bpm, normal axis, no evidence of ischemia.   Recommendations:   Patient with longstanding history of previously uncontrolled diabetes, not well controlled, heavy tobacco use disorder, centrilobular emphysema, chronic lower extremity ulceration probably related to diabetes mellitus, chronic bilateral leg edema which is now resolved, latest admission to the hospital in February 2020 with pneumonia and influenza and since then has  completely quit smoking.  He has hyperlipidemia and hypertension.  Patient has had significant improvement in dyspnea on exertion and is only having to use oxygen therapy with overexertion.  Wife attributes this to starting Lasix.  He is also been evaluated by pulmonary and is being managed for COPD that is also likely contributing.  I have discussed recently obtained Lexiscan nuclear stress test results, no perfusion abnormalities were noted and was considered low risk study.  Echocardiogram was essentially normal without  depressed LVEF or diastolic dysfunction.  I suspect his symptoms were related to pneumonia as well as COPD.  Recent chest x-ray had improved compared to previous in March.  He will continue with daily Lasix for now, but would recommend changing to as needed dosing in the next few months.  I will check BMP in the next few days for follow-up on kidney function since starting Lasix.  He is to see his PCP in 3 months for follow-up.  Bilateral leg edema is chronic and intermittent.  Wife reports improvement with use of support stockings and also Lasix.  Blood pressure was elevated today, but generally is very well controlled.  Wife feels that this was related to them having difficulty with their cuff and also taking care of their grandchild.  They will continue to monitor regularly. Patient and his wife were pleased with evaluation. As patient's symptoms have improved and cardiac work-up was unyielding, will have him follow-up with [place] on an as-needed basis, but encouraged him to contact us for any new or worsening problems.   Miquel Dunn, MSN, APRN, FNP-C [facility]. [place] Office: [phone] Fax: [phone]

## 2018-07-31 ENCOUNTER — Encounter: Payer: Self-pay | Admitting: Cardiology

## 2018-08-05 ENCOUNTER — Other Ambulatory Visit: Payer: Self-pay | Admitting: Cardiology

## 2018-08-07 NOTE — Telephone Encounter (Signed)
Called the wife back and we discussed the machine. And the fact that Dr Brett Fairy is unable to truly see if the pressure is too much because the patient has not really gave much effort to using the machine. Advised that in order for her to truly assess the patient would need to attempt to use the machine. The pt states that he feels that soon as he starts he feels smothered or that the pressure is too much and he can't tolerate. The wife has multiple questions and in order for him to have a chance to meet compliance she would like the opportunity to see if something can be done otherwise they will have to turn the machine in. Scheduled a follow up visit for next week

## 2018-08-07 NOTE — Telephone Encounter (Signed)
Pt returning call please call back °

## 2018-08-14 ENCOUNTER — Ambulatory Visit: Payer: Self-pay | Admitting: Neurology

## 2018-08-23 ENCOUNTER — Other Ambulatory Visit: Payer: Self-pay | Admitting: Internal Medicine

## 2018-08-23 ENCOUNTER — Ambulatory Visit
Admission: RE | Admit: 2018-08-23 | Discharge: 2018-08-23 | Disposition: A | Discharge status: Home / Self Care | Payer: 59 | Source: Ambulatory Visit | Attending: Internal Medicine | Admitting: Internal Medicine

## 2018-08-23 ENCOUNTER — Other Ambulatory Visit: Payer: Self-pay

## 2018-08-23 DIAGNOSIS — R52 Pain, unspecified: Secondary | ICD-10-CM

## 2018-08-26 ENCOUNTER — Other Ambulatory Visit: Payer: Self-pay | Admitting: Pulmonary Disease

## 2018-08-31 ENCOUNTER — Other Ambulatory Visit (HOSPITAL_COMMUNITY)
Admission: RE | Admit: 2018-08-31 | Discharge: 2018-08-31 | Disposition: A | Discharge status: Home / Self Care | Payer: 59 | Source: Ambulatory Visit | Attending: Pulmonary Disease | Admitting: Pulmonary Disease

## 2018-08-31 DIAGNOSIS — Z20828 Contact with and (suspected) exposure to other viral communicable diseases: Secondary | ICD-10-CM | POA: Diagnosis not present

## 2018-08-31 DIAGNOSIS — Z01812 Encounter for preprocedural laboratory examination: Secondary | ICD-10-CM | POA: Insufficient documentation

## 2018-08-31 LAB — SARS CORONAVIRUS 2 (TAT 6-24 HRS): SARS Coronavirus 2: NEGATIVE

## 2018-09-04 ENCOUNTER — Encounter: Payer: Self-pay | Admitting: Acute Care

## 2018-09-04 ENCOUNTER — Ambulatory Visit: Payer: 59 | Admitting: Pulmonary Disease

## 2018-09-04 ENCOUNTER — Other Ambulatory Visit: Payer: Self-pay

## 2018-09-04 ENCOUNTER — Ambulatory Visit (INDEPENDENT_AMBULATORY_CARE_PROVIDER_SITE_OTHER): Payer: 59 | Admitting: Acute Care

## 2018-09-04 ENCOUNTER — Ambulatory Visit (INDEPENDENT_AMBULATORY_CARE_PROVIDER_SITE_OTHER): Payer: 59 | Admitting: Pulmonary Disease

## 2018-09-04 VITALS — BP 160/60 | HR 73 | Temp 97.0°F | Ht 69.0 in | Wt 177.0 lb

## 2018-09-04 DIAGNOSIS — R0602 Shortness of breath: Secondary | ICD-10-CM

## 2018-09-04 DIAGNOSIS — J449 Chronic obstructive pulmonary disease, unspecified: Secondary | ICD-10-CM

## 2018-09-04 DIAGNOSIS — J9621 Acute and chronic respiratory failure with hypoxia: Secondary | ICD-10-CM

## 2018-09-04 DIAGNOSIS — R0600 Dyspnea, unspecified: Secondary | ICD-10-CM | POA: Diagnosis not present

## 2018-09-04 DIAGNOSIS — R5381 Other malaise: Secondary | ICD-10-CM

## 2018-09-04 LAB — PULMONARY FUNCTION TEST
DL/VA % pred: 34 %
DL/VA: 1.45 ml/min/mmHg/L
DLCO unc % pred: 29 %
DLCO unc: 7.69 ml/min/mmHg
FEF 25-75 Post: 1.01 L/sec
FEF 25-75 Pre: 0.52 L/sec
FEF2575-%Change-Post: 96 %
FEF2575-%Pred-Post: 37 %
FEF2575-%Pred-Pre: 19 %
FEV1-%Change-Post: 24 %
FEV1-%Pred-Post: 48 %
FEV1-%Pred-Pre: 38 %
FEV1-Post: 1.61 L
FEV1-Pre: 1.3 L
FEV1FVC-%Change-Post: 2 %
FEV1FVC-%Pred-Pre: 69 %
FEV6-%Change-Post: 21 %
FEV6-%Pred-Post: 68 %
FEV6-%Pred-Pre: 55 %
FEV6-Post: 2.89 L
FEV6-Pre: 2.37 L
FEV6FVC-%Change-Post: 0 %
FEV6FVC-%Pred-Post: 101 %
FEV6FVC-%Pred-Pre: 100 %
FVC-%Change-Post: 21 %
FVC-%Pred-Post: 67 %
FVC-%Pred-Pre: 55 %
FVC-Post: 3 L
FVC-Pre: 2.48 L
Post FEV1/FVC ratio: 54 %
Post FEV6/FVC ratio: 96 %
Pre FEV1/FVC ratio: 52 %
Pre FEV6/FVC Ratio: 96 %
RV % pred: 204 %
RV: 4.64 L
TLC % pred: 113 %
TLC: 7.7 L

## 2018-09-04 MED ORDER — TRELEGY ELLIPTA 100-62.5-25 MCG/INH IN AEPB: 1.0000 | INHALATION_SPRAY | Freq: Every day | RESPIRATORY_TRACT | 0 refills | Status: DC

## 2018-09-04 NOTE — Progress Notes (Signed)
History of Present Illness [name] is a 63 y.o. male former heavy smoker ( Quit 02/2018 with a 40 pack year smoking history. He is followed by Dr. Vaughan Browner  Synopsis heavy ex-smoker with history of anxiety, chronic narcotic use, severe central sleep apnea, diabetes, peripheral neuropathy, deafness, chronic pain on opiates  Admitted in March 2020 for acute on chronic respiratory failure.  Diagnosed with parainfluenza virus infection and multilobar pneumonia.  Treated with ceftriaxone and azithromycin. Admitted again in May 2020 for cellulitis of right lower extremity.  COVID was negative.  He is on 2 L oxygen since admission in March.  He has Anoro inhaler but is not using it yet History of severe central sleep apnea and is not tolerant of  BiPAP.  Follows with Dr. Brett Fairy, and will follow up with her regarding his non-compliance and untreated OSA.  Cardiac work up has been negative thus far. He is on Lasix 20 mg daily, but cards suggested this be changed to a prn dosage     09/04/2018 Follow Up Dyspnea  Pt. Presents for follow up. He was seen by Dr. Vaughan Browner 07/05/2018. He suspected that the patient's dyspnea was 2/2 COPD given his smoking history.He asked him to start using his Anoro, continue supplemental oxygen, he ordered PFT's and a CXR to ensure clearing of pulmonary infiltrates noted 03/2018.The patient returns stating that he has not  been compliant with use of Anoro. He states he did not think he needed it. He He has not been wearing his oxygen as he states he has been fine. However on presentation today his sats are 78-84% by both finger probe and forehead probe.He states he has returned his oxygen because he no longer needs it. He states he checks his oxygen saturations at home with 2 new saturation monitors and they are fine. His oxygen sats  Were  94% once we placed him on 2 L of  oxygen. He appears to be very tolerant of low oxygen levels, however he is very confused at times. We  discussed the risks of  Chronic low oxygen levels . He asked if we could do a follow up televisit with his wife to explain the results of the PFT's . I explained that his PFT's indicate severe COPD.  Test Results:  Echo 03/20/7423 Normal LV systolic function with EF of 65%.  Left ventricle cavity is normal in size.  Mild concentric hypertrophy of left ventricle.  Normal global wall motion.  Normal diastolic filling pattern.  Calculated EF 65%.   Trace aortic regurgitation Mild grade 1 mitral regurgitation Mild tricuspid regurgitation estimated pulmonary artery systolic pressure is 30 mmHg IVC is dilated with a respiratory response of less than 50%. Estimated RA pressure is 10 to 15 mmHg.  CXR 07/05/2018 No active cardiopulmonary disease.  Bronchitic changes.  Lexiscan Sestamibi Stress Test06/22/2020: Stress EKG is non-diagnostic, as this is pharmacological stress test. Myocardial pefusion imaging is normal. Left ventricular ejection fraction is 63% with normal wall motion. Low risk study.  CTA of chest 05/27/2018:  1. No pulmonary embolism. 2. Minimal pulmonary edema. 3. Tiny right pleural effusion and mild right base atelectasis.  4. Mild mediastinal and bilateral hilar lymph node enlargement. This is of uncertain etiology but may be reactive.  CXR 05/27/2018: Question minimal right basilar infiltrate and pleural effusion.   Chest x-ray 04/14/2018-in different parts opacities in the right upper and lower lobes.  Reviewed images personally  Labs: CBC 05/01/2018-WBC 5.1, eos 3%, absolute eosinophil count 153  Assessment:   CBC Latest Ref Rng & Units 06/02/2018 06/01/2018 05/31/2018  WBC 4.0 - 10.5 K/uL 3.6(L) 3.5(L) 5.1  Hemoglobin 13.0 - 17.0 g/dL 8.4(L) 8.5(L) 10.2(L)  Hematocrit 39.0 - 52.0 % 26.7(L) 27.3(L) 31.9(L)  Platelets 150 - 400 K/uL 173 156 198    BMP Latest Ref Rng & Units 06/02/2018 06/01/2018 05/31/2018  Glucose 70 - 99 mg/dL 81 87 156(H)  BUN 8 - 23 mg/dL 19 19 20    Creatinine 0.61 - 1.24 mg/dL 1.24 1.32(H) 1.31(H)  BUN/Creat Ratio 10 - 24 - - -  Sodium 135 - 145 mmol/L 136 138 138  Potassium 3.5 - 5.1 mmol/L 4.2 4.2 4.6  Chloride 98 - 111 mmol/L 102 103 100  CO2 22 - 32 mmol/L 28 27 27   Calcium 8.9 - 10.3 mg/dL 8.4(L) 8.3(L) 9.3    BNP No results found for: BNP  ProBNP No results found for: PROBNP  PFT    Component Value Date/Time   FEV1PRE 1.30 09/04/2018 1526   FEV1POST 1.61 09/04/2018 1526   FVCPRE 2.48 09/04/2018 1526   FVCPOST 3.00 09/04/2018 1526   TLC 7.70 09/04/2018 1526   DLCOUNC 7.69 09/04/2018 1526   PREFEV1FVCRT 52 09/04/2018 1526   PSTFEV1FVCRT 54 09/04/2018 1526  PFT 09/04/2018 FVC actual 2.48,  FVC predicted 4.47, % predicted 55 FEV1 actual 1.30,  FEV1 predicted 3.35, % predicted 38  FF ratio actual 52,  predicted 75, % predicted 69 DLCO 29% predicted Positive bronchodilator response Severe COPD ( Stage 3 by  FEVI )   Dg Thoracic Spine W/swimmers  Result Date: 08/23/2018 CLINICAL DATA:  Pain after recent trauma EXAM: THORACIC SPINE - 3 VIEWS COMPARISON:  None. FINDINGS: Mild anterior wedging of T12 may be mildly more prominent when compared to 2015 but is otherwise age indeterminate. No other evidence of fracture or malalignment. Multilevel degenerative changes identified. IMPRESSION: Mild anterior wedging of T12 is mildly more prominent when compared to 2015 but otherwise age indeterminate. Recommend clinical correlation. Multilevel degenerative changes. Electronically Signed   By: Dorise Bullion III M.D   On: 08/23/2018 13:35   Dg Lumbar Spine 2-3 Views  Result Date: 08/23/2018 CLINICAL DATA:  Right low back pain.  Recent fall. EXAM: LUMBAR SPINE - 2-3 VIEW COMPARISON:  None. FINDINGS: Mild curvature lumbar spine, apex to the right. Mild anterior wedging of T12 is age indeterminate. No other evidence of fracture or traumatic malalignment. Multilevel degenerative changes. Lower lumbar facet degenerative changes.  IMPRESSION: 1. Mild anterior wedging of T12 is age indeterminate. Recommend clinical correlation. 2. Multilevel degenerative disc disease. Lower lumbar facet degenerative changes. Electronically Signed   By: Dorise Bullion III M.D   On: 08/23/2018 13:36   Dg Shoulder Right  Result Date: 08/23/2018 CLINICAL DATA:  Recent fall.  Pain. EXAM: RIGHT SHOULDER - 2+ VIEW COMPARISON:  None. FINDINGS: No fracture or dislocation. Minimal degenerative changes in the glenohumeral joint. Mild spurring along the undersurface of the acromion. IMPRESSION: Mild degenerative changes as above. Mild spurring along the undersurface of the acromion. No fracture or dislocation. Electronically Signed   By: Dorise Bullion III M.D   On: 08/23/2018 13:30   Dg Shoulder Left  Result Date: 08/23/2018 CLINICAL DATA:  Fall 8 days ago. EXAM: LEFT SHOULDER - 2+ VIEW COMPARISON:  Chest x-ray January 24, 2013 FINDINGS: Calcifications in the soft tissues adjacent to the left humeral head are most consistent with calcific tendinosis. Limited views of the chest are normal. Mild to moderate degenerative changes are seen  in the [facility] joint and the glenohumeral joint. No fracture or dislocation. No suspicious bony lesions. No other abnormalities. IMPRESSION: 1. Calcific tendinosis. 2. Mild-to-moderate degenerative changes in the [facility] joint and the glenohumeral joint. 3. No other abnormalities. Electronically Signed   By: Dorise Bullion III M.D   On: 08/23/2018 13:29   Dg Hips Bilat With Pelvis 2v  Result Date: 08/23/2018 CLINICAL DATA:  Pain.  Recent trauma. EXAM: DG HIP (WITH OR WITHOUT PELVIS) 2V BILAT COMPARISON:  None. FINDINGS: Mild degenerative changes are seen in the bilateral hips without significant loss of joint space. No fractures or dislocations. No bony lesions. IMPRESSION: No acute abnormalities.  Mild hip degenerative changes. Electronically Signed   By: Dorise Bullion III M.D   On: 08/23/2018 13:31     Past medical hx Past Medical  History:  Diagnosis Date   Anxiety    Arthritis    Chronic narcotic use    Complication of anesthesia    h/o aspiration during back surgery   Depression    Diabetic peripheral neuropathy ([place])    Hearing loss    wears bilateral hearing aids, bilat moderate SNHL   Hyperlipidemia    Hypertension    Insomnia    Tobacco abuse    Type 1 diabetes mellitus (HCC)    Ulcer of left ankle (HCC)    Vision abnormalities    mild DM retinopathy     Social History   Tobacco Use   Smoking status: Former Smoker    Packs/day: 1.00    Years: 40.00    Pack years: 40.00    Types: Cigarettes    Quit date: 02/16/2018    Years since quitting: 0.5   Smokeless tobacco: Never Used  Substance Use Topics   Alcohol use: No    Alcohol/week: 0.0 standard drinks   Drug use: No    Mr.Pines reports that he quit smoking about 6 months ago. His smoking use included cigarettes. He has a 40.00 pack-year smoking history. He has never used smokeless tobacco. He reports that he does not drink alcohol or use drugs.  Tobacco Cessation: Former smoker Quit 02/2018 40 pack year smoking history  Past surgical hx, Family hx, Social hx all reviewed.  Current Outpatient Medications on File Prior to Visit  Medication Sig   ALPRAZolam (XANAX) 0.5 MG tablet Take 0.5 mg by mouth at bedtime as needed for anxiety.   amLODipine (NORVASC) 5 MG tablet Take 5 mg by mouth daily.   aspirin EC 81 MG tablet Take 81 mg by mouth daily.   divalproex (DEPAKOTE) 250 MG DR tablet Take 250 mg by mouth daily.   doxepin (SINEQUAN) 25 MG capsule Take 25 mg by mouth at bedtime.   furosemide (LASIX) 20 MG tablet TAKE 1 TABLET BY MOUTH EVERY DAY   gabapentin (NEURONTIN) 300 MG capsule Take 300 mg by mouth 3 (three) times daily.    glipiZIDE (GLUCOTROL XL) 2.5 MG 24 hr tablet Take 2.5-5 mg by mouth See admin instructions. Take 2 tablets (5 mg) by mouth with breakfast and 1 tablet (2.5 mg) with supper    hydrochlorothiazide (HYDRODIURIL) 25 MG tablet Take 25 mg by mouth daily.   HYDROcodone-acetaminophen (NORCO/VICODIN) 5-325 MG tablet Take 1 tablet by mouth every 6 (six) hours as needed for moderate pain.    Insulin Glargine (BASAGLAR KWIKPEN) 100 UNIT/ML SOPN Inject 16 Units into the skin daily. In morning   losartan (COZAAR) 100 MG tablet Take 100 mg by mouth daily.  metFORMIN (GLUCOPHAGE-XR) 500 MG 24 hr tablet Take 500 mg by mouth 2 (two) times a day.    methadone (DOLOPHINE) 5 MG tablet Take 10 mg by mouth 4 (four) times daily.    metoprolol tartrate (LOPRESSOR) 25 MG tablet Take 25-50 mg by mouth See admin instructions. Take 2 tablets (50 mg) by mouth every morning and 1 tablet (25 mg) at bedtime   nortriptyline (PAMELOR) 25 MG capsule Take 25 mg by mouth at bedtime.     OXYGEN Inhale into the lungs. 3 liters   potassium citrate (UROCIT-K) 10 MEQ (1080 MG) SR tablet Take 10 mEq by mouth daily.   sertraline (ZOLOFT) 50 MG tablet Take 50 mg by mouth at bedtime.    simvastatin (ZOCOR) 20 MG tablet Take 20 mg by mouth at bedtime.     umeclidinium-vilanterol (ANORO ELLIPTA) 62.5-25 MCG/INH AEPB Inhale 1 puff into the lungs daily.   No current facility-administered medications on file prior to visit.      Allergies  Allergen Reactions   Codeine Nausea And Vomiting   Tetracyclines & Related Hives    Review Of Systems:  Constitutional:   No  weight loss, night sweats,  Fevers, chills, fatigue, or  lassitude.  HEENT:   No headaches,  Difficulty swallowing,  Tooth/dental problems, or  Sore throat,                No sneezing, itching, ear ache, nasal congestion, post nasal drip,   CV:  No chest pain,  Orthopnea, PND, swelling in lower extremities, anasarca, dizziness, palpitations, syncope.   GI  No heartburn, indigestion, abdominal pain, nausea, vomiting, diarrhea, change in bowel habits, loss of appetite, bloody stools.   Resp: + shortness of breath with exertion not   at rest.  No excess mucus, no productive cough,  No non-productive cough,  No coughing up of blood.  No change in color of mucus.  No wheezing.  No chest wall deformity,  Skin: no rash or lesions.  GU: no dysuria, change in color of urine, no urgency or frequency.  No flank pain, no hematuria   MS:  No joint pain or swelling.  No decreased range of motion.  No back pain.  Psych:  No change in mood or affect. No depression or anxiety.  No memory loss.   Vital Signs BP (!) 160/60 (BP Location: Left Arm, Cuff Size: Normal)    Pulse 73    Temp (!) 97 F (36.1 C) (Oral)    Ht 5\' 9"  (1.753 m)    Wt 177 lb (80.3 kg)    SpO2 (!) 80%    BMI 26.14 kg/m    Physical Exam:  General- No distress,  A&Ox3, pleasant, confused at times ENT: No sinus tenderness, TM clear, pale nasal mucosa, no oral exudate,no post nasal drip, no LAN Cardiac: S1, S2, regular rate and rhythm, no murmur Chest: No wheeze/ rales/ dullness; no accessory muscle use, no nasal flaring, no sternal retractions, + crackles per Right  base  Abd.: Soft Non-tender, ND. BS +, Body mass index is 26.14 kg/m. Ext: No clubbing cyanosis, edema Neuro:  normal strength, MAE x 4, A&O x 3, Appriopriate Skin: No rashes, warm and dry Psych: normal mood and behavior, confused when asking specific questions   Assessment/Plan  Dyspnea? Suspected COPD Former smoker Quit 02/2018 Echo with EF of 65% and normal PAP PFT's indicate >>> COPD Gold 3 ( Severe) with + BD response and severely low DLCO>> 29% Saturations on RA  in the office today 78-84% ( pt is not using oxygen >> sent it back to DME) Plan We will re-order oxygen through your DME Please wear at 2 L [place]  We will titrate it today Saturation goals are 88-92%. We will change your inhaler to Trelegy Use one puff once daily Rinse mouth after use We will request a copy of the CT Chest you had done 05/2018.[facility])  We need to take a closer look at it to evaluate your lungs. We  may need to consider doing a different type of CT Chest in the near future.  Lasix as ordered per cards Note your daily symptoms > remember "red flags" for COPD:  Increase in cough, increase in sputum production, increase in shortness of breath or activity intolerance. If you notice these symptoms, please call to be seen.   Follow up with Dr. Vaughan Browner in 2 weeks to ensure you are doing well on oxygen   Follow up televisit with Judson Roch NP Monday of next week to discuss this with your wife.  Physical Deconditioning Plan Crush injury to his feet that limits his activity Chronic leg edema, Followed by Dr. Sharol Given >> stable wearing support stockings Plan Encourage increased activity as able>> consider  swimming or other non weight bearing activities.  Smoking Cessation Congratulations on remaining smoke free.   Elevated BP Follow up with PCP/ cards  Discussion I am very concerned about the patient's lack of dyspnea with sats on the 70's , and his non-compliance with therapy because of this lack of awareness of symptoms.He returned his oxygen to DME on 08/21/2018.  I have reviewed his PFT's with him at length, I explained that he does have COPD as a result of heavy smoking. I also explained that he must wear his oxygen to maintain sats of > 88% and that he must use his trelegy every day without fail. We have also prescribed him a rescue inhaler. He verbalized understanding. He is hard of hearing. I will arrange a tele visit with his wife  Monday of next week to explain  this to her. I do not have confidence that he will be able to explain this to her.   We will see if we can get a copy of his CT Chest from [facility] We may need to repeat with HRCT to evaluate for IPF  Magdalen Spatz, NP 09/04/2018  4:52 PM

## 2018-09-04 NOTE — Patient Instructions (Addendum)
It is good to meet you today. Your Pulmonary Function Test shows you have severe COPD. This is a form of lung disease caused by smoking cigarettes.  You need to wear oxygen.  We will re-order oxygen through your DME Saturation goals are 88-92%. We will titrate your oxygen today in the office Please wear at 2 L [place] at rest and 4 L with activity. Stop Anoro Start Trelegy 1 puff once daily Rinse mouth after use. We will give you a sample to see if you have good response to it.  You must use this every day without fail. We will order a rescue inhaler  Use this as needed for breakthrough shortness of breath up to 3 times daily We will call [facility] to get a copy of the CT chest you had done 05/2018 so that we can evaluate the actual images. We may need to order a different type of CT once we have reviewed it.  Call us is you need to use rescue therapy  Frequently so we can evaluate you. Note your daily symptoms > remember "red flags" for COPD:  Increase in cough, increase in sputum production, increase in shortness of breath or activity intolerance. If you notice these symptoms, please call to be seen.   Continue Lasix per cards Follow up in 2 weeks with Dr. Vaughan Browner of [facility] NP Televisit Monday of next week to review this with your wife. Please contact office for sooner follow up if symptoms do not improve or worsen or seek emergency care

## 2018-09-04 NOTE — Progress Notes (Signed)
PFT done today. 

## 2018-09-09 ENCOUNTER — Other Ambulatory Visit: Payer: Self-pay

## 2018-09-09 ENCOUNTER — Encounter: Payer: Self-pay | Admitting: Acute Care

## 2018-09-09 ENCOUNTER — Ambulatory Visit (INDEPENDENT_AMBULATORY_CARE_PROVIDER_SITE_OTHER): Payer: 59 | Admitting: Acute Care

## 2018-09-09 DIAGNOSIS — J9611 Chronic respiratory failure with hypoxia: Secondary | ICD-10-CM

## 2018-09-09 DIAGNOSIS — J449 Chronic obstructive pulmonary disease, unspecified: Secondary | ICD-10-CM

## 2018-09-09 MED ORDER — TRELEGY ELLIPTA 100-62.5-25 MCG/INH IN AEPB: 1.0000 | INHALATION_SPRAY | Freq: Every day | RESPIRATORY_TRACT | 3 refills | Status: DC

## 2018-09-09 NOTE — Patient Instructions (Addendum)
It was good to talk with [name] today. I am glad your oxygen levels are better on the oxygen Continue wearing oxygen at 2 L at rest and 3-4 liters with exertion. Saturation goals are 88-92%. Use your rescue inhaler for breakthrough shortness of breath. Please have Dr. Irven Shelling office send [place]  a disc with the CT scan that was done there in May.You will need to give permission to have it sent to [place].  Continue Trelegy 1 puff once daily without fail.. We will send in a prescription to your pharmacy. Remember to rinse mouth after use. We will send in an order for an [employer] oxygen tank. ( We will specify that he had a crushing foot injury,that  he is not stable on his feet,  he has had shoulder and back surgery. He is unable to manage any other body worn apparatus for his oxygen needs). Please follow up with PCP regarding patients mental status changes that have not improved with oxygen therapy. Follow up in 2 months with Dr. Vaughan Browner  Please contact office for sooner follow up if symptoms do not improve or worsen or seek emergency care

## 2018-09-09 NOTE — Progress Notes (Signed)
Virtual Visit via Telephone Note  I connected with [name] on 09/09/18 at 10:30 AM EDT by telephone and verified that I am speaking with the correct person using two identifiers.  Location: Patient: At home Provider: At the office, [address], [place], [place], [address]   I discussed the limitations, risks, security and privacy concerns of performing an evaluation and management service by telephone and the availability of in person appointments. I also discussed with the patient that there may be a patient responsible charge related to this service. The patient expressed understanding and agreed to proceed.  [name] is a 63 y.o. male former heavy smoker ( Quit 02/2018 with a 40 pack year smoking history). He is followed by Dr. Vaughan Browner  Synopsis heavy ex-smoker with history of anxiety, chronic narcotic use, severe central sleep apnea, diabetes, peripheral neuropathy, deafness,chronic pain on opiates  Admitted in March 2020 for acute on chronic respiratory failure. Diagnosed with parainfluenza virus infection and multilobar pneumonia. Treated with ceftriaxone and azithromycin. Admitted againinMay 2020 for cellulitis of right lower extremity. COVID wasnegative. He is on 2 L oxygen since admission in March. He has Anoro inhaler but is not using it yet History of severe central sleep apnea and is not tolerant of  BiPAP. Follows with Dr. Brett Fairy, and will follow up with her regarding his non-compliance and untreated OSA.  Cardiac work up has been negative thus far. He is on Lasix 20 mg daily, but cards suggested this be changed to a prn dosage   History of Present Illness: Pt. Was seen in the office 09/04/2018 after PFT's were done. Upon arrival to the exam room, patient was noted to have saturations of 78-84% on RA. This was confirmed with multiple devices and forehead probe.  He had been discharged from the hospital 06/03/2018  with oxygen. He had returned the oxygen   prior to the visit on 8/19 because he felt he did not need it any more. He did have episodes of confusion prior to being placed on oxygen in the exam room. He was counseled at length about using his oxygen as we were re-ordering it for him. We gave him specific saturation goals of > 88%.   PFT's done 8/19 revealed that the patient had Gold stage 3 COPD. He could not remember if he was using Anoro every day. He was given a therapeutic trial of Trelegy, and we are following up today to ensure he is using his oxygen and to see if he has experienced any clinical improvement with Trelegy use.I was  very concerned about the patient's lack of dyspnea and awareness with sats on the 70's , and his non-compliance with therapy because of this lack of awareness of symptoms. He was also prescribed a rescue inhaler at that appointment.  At follow up today I spoke with his patient's wife. The patient was still in bed. ( She stated this is normal for him, as he stays up late at night).  The patient is using his oxygen consistently. Per his wife his sats are maintained > 88% at all times. She states he is using the Trelegy daily. When his wife asked him if he feels there is  any improvement he just shrugs his shoulders. He has stopped smoking. His wife is convinced that he had  COVID in March of 2020. His wifee states she saw a significant change in him after this illness>> his clarity of mind and worsening dyspnea. He did have a  cards work up and he was started on lasix which has helped with dyspnea also. She states he had also smoked pott in the past.  She has concerns about his mental state. He had several falls prior to using his oxygen.He has not fallen since he has been wearing his oxygen. He also changed the shoes he wears.  His wife is concerned about the patient's stability on his feet. He had a crushing foot injury, he is not stable on his feet. He has had shoulder and back surgery. He is unable to manage any other  body worn apparatus for his oxygen needs .    Per his wife he is at his pulmonary  Baseline. He has no fever, no secretions , rare coiugh. but she has concerns about his current mental status changes that have not improved with oxygen therapy.     Observations/Objective: PFT 09/04/2018 FVC actual 2.48,  FVC predicted 4.47, % predicted 55 FEV1 actual 1.30,  FEV1 predicted 3.35, % predicted 38  FF ratio actual 52,  predicted 75, % predicted 69 DLCO 29% predicted Positive bronchodilator response Severe COPD ( Stage 3 by  FEVI )   Echo [id number] Normal LV systolic function with EF of 65%.  Left ventricle cavity is normal in size.  Mild concentric hypertrophy of left ventricle.  Normal global wall motion.  Normal diastolic filling pattern.  Calculated EF 65%.   Trace aortic regurgitation Mild grade 1 mitral regurgitation Mild tricuspid regurgitation estimated pulmonary artery systolic pressure is 30 mmHg IVC is dilated with a respiratory response of less than 50%. Estimated RA pressure is 10 to 15 mmHg.  CXR 07/05/2018 No active cardiopulmonary disease. Bronchitic changes.  Lexiscan Sestamibi Stress Test06/22/2020: Stress EKG is non-diagnostic, as this is pharmacological stress test. Myocardial pefusion imaging is normal. Left ventricular ejection fraction is 63% with normal wall motion. Low risk study.  CTA of chest 05/27/2018:  1. No pulmonary embolism. 2. Minimal pulmonary edema. 3. Tiny right pleural effusion and mild right base atelectasis.  4. Mild mediastinal and bilateral hilar lymph node enlargement. This is of uncertain etiology but may be reactive.  CXR 05/27/2018: Question minimal right basilar infiltrate and pleural effusion.   Chest x-ray 04/14/2018-in different parts opacities in the right upper and lower lobes.   Labs: CBC 05/01/2018-WBC 5.1, eos 3%, absolute eosinophil count153   Assessment and Plan: Hypoxic Respiratory Failure Plan Continue wearing  oxygen at 2 L at rest and 3-4 liters with exertion. Saturation goals are 88-92%. Use your rescue inhaler for breakthrough shortness of breath. Please have Dr. Irven Shelling office send [place]  a disc with the CT scan that was done there in May.You will need to give permission to have it sent to [place].  Continue Trelegy 1 puff once daily without fail.. We will send in a prescription to your pharmacy. Remember to rinse mouth after use. We will send in an order for an [employer] oxygen tank. ( We will specify that he had a crushing foot injury,that  he is not stable on his feet,  he has had shoulder and back surgery. He is unable to manage any other body worn apparatus for his oxygen needs). Please follow up with PCP regarding patients mental status changes that have not improved with oxygen therapy. Follow up in 2 months with Dr. Vaughan Browner  Please contact office for sooner follow up if symptoms do not improve or worsen or seek emergency care   Mental Status Changes Wife state this has been ongoing  since March of 2020 She feels he had COVID at that time of hospitalization He has not had any head imaging Plan Please follow up with PCP about altered mental status Seek emergency care if it becomes acutely worse   Follow Up Instructions: Follow up with Dr. Vaughan Browner in 8 weeks .  Call if you need [place] sooner Follow up with PCP about mental status changes not improved with oxygen therapy Follow up with cardiology per Dr. Einar Gip   I discussed the assessment and treatment plan with the patient. The patient was provided an opportunity to ask questions and all were answered. The patient agreed with the plan and demonstrated an understanding of the instructions.   The patient was advised to call back or seek an in-person evaluation if the symptoms worsen or if the condition fails to improve as anticipated.  I provided 30 minutes of non-face-to-face time during this encounter.   Magdalen Spatz, NP  09/09/2018 11:13  AM

## 2018-09-09 NOTE — Assessment & Plan Note (Signed)
Compliant with oxygen per wife Saturations are > 90% at all times Plan Continue wearing oxygen at 2 L at rest and 3-4 liters with exertion. Saturation goals are 88-92%. Use your rescue inhaler for breakthrough shortness of breath. Please have Dr. Irven Shelling office send [place]  a disc with the CT scan that was done there in May.You will need to give permission to have it sent to [place].  Continue Trelegy 1 puff once daily without fail.. We will send in a prescription to your pharmacy. Remember to rinse mouth after use. We will send in an order for an Inogen oxygen tank. ( We will specify that he had a crushing foot injury,that  he is not stable on his feet,  he has had shoulder and back surgery. He is unable to manage any other body worn apparatus for his oxygen needs). Please follow up with PCP regarding patients mental status changes that have not improved with oxygen therapy. Follow up in 2 months with Dr. Vaughan Browner  Please contact office for sooner follow up if symptoms do not improve or worsen or seek emergency care

## 2018-09-10 ENCOUNTER — Telehealth: Payer: Self-pay | Admitting: Acute Care

## 2018-09-10 DIAGNOSIS — J9611 Chronic respiratory failure with hypoxia: Secondary | ICD-10-CM

## 2018-09-10 DIAGNOSIS — J449 Chronic obstructive pulmonary disease, unspecified: Secondary | ICD-10-CM

## 2018-09-10 NOTE — Telephone Encounter (Signed)
Levada Dy from [facility] called regarding O2 order placed yesterday 09/10/2018. Levada Dy states [facility] does not carry the [employer]ogen oxygen tank and that that portable O2 is currently on back order. She has made the patient's wife aware of this.   I am routing this message to SG as an FYI and if there are any additional recommendations. SG, please advise. Thank you.

## 2018-09-11 NOTE — Telephone Encounter (Signed)
[facility] does have some other lightweight options and if we place an order for best fit portable oxygen evaluation they can work with patient on what might work best for him.    SG please advise. Thank you!

## 2018-09-13 NOTE — Telephone Encounter (Signed)
Sarah please advise, thank you!

## 2018-09-16 NOTE — Telephone Encounter (Signed)
Please call patient's wife to see if she feels any of the other options will work for her husband. He has several injuries that make other types of oxygen devices poor options. Thanks so much.

## 2018-09-16 NOTE — Telephone Encounter (Signed)
Attempted to contact, voicemail has not been set up yet.

## 2018-09-16 NOTE — Telephone Encounter (Signed)
Reviewed chart notes.  No updates from provider yet regarding this encounter.  Will hold open as last note for review was over the weekend.

## 2018-09-17 NOTE — Telephone Encounter (Signed)
Order placed with notation. Nothing further needed at this time.

## 2018-09-17 NOTE — Telephone Encounter (Signed)
OK to send best fit order to [facility]. Thanks

## 2018-09-17 NOTE — Telephone Encounter (Signed)
Called and spoke to pt's wife, [name]. Informed her that [facility] does not currently have any POCs. [name] states the [employer] (with the cart) are too large for pt to manage and the more portable system about half the size of the E-tank is too heavy. [name] states she would like to see what other portable systems [facility] has to offer.   SG please advise if ok to place a 'best fit' order to [facility]. Thanks.

## 2018-09-20 ENCOUNTER — Emergency Department (HOSPITAL_COMMUNITY): Payer: Medicare Other

## 2018-09-20 ENCOUNTER — Telehealth: Payer: Self-pay | Admitting: Pulmonary Disease

## 2018-09-20 ENCOUNTER — Encounter (HOSPITAL_COMMUNITY): Payer: Self-pay | Admitting: Emergency Medicine

## 2018-09-20 ENCOUNTER — Emergency Department (HOSPITAL_COMMUNITY)
Admission: EM | Admit: 2018-09-20 | Discharge: 2018-09-20 | Disposition: A | Discharge status: Home / Self Care | Payer: Medicare Other | Attending: Emergency Medicine | Admitting: Emergency Medicine

## 2018-09-20 ENCOUNTER — Other Ambulatory Visit: Payer: Self-pay

## 2018-09-20 DIAGNOSIS — I129 Hypertensive chronic kidney disease with stage 1 through stage 4 chronic kidney disease, or unspecified chronic kidney disease: Secondary | ICD-10-CM | POA: Diagnosis not present

## 2018-09-20 DIAGNOSIS — N183 Chronic kidney disease, stage 3 (moderate): Secondary | ICD-10-CM | POA: Diagnosis not present

## 2018-09-20 DIAGNOSIS — E1165 Type 2 diabetes mellitus with hyperglycemia: Secondary | ICD-10-CM | POA: Diagnosis not present

## 2018-09-20 DIAGNOSIS — Z7982 Long term (current) use of aspirin: Secondary | ICD-10-CM | POA: Insufficient documentation

## 2018-09-20 DIAGNOSIS — R4182 Altered mental status, unspecified: Secondary | ICD-10-CM | POA: Insufficient documentation

## 2018-09-20 DIAGNOSIS — Z23 Encounter for immunization: Secondary | ICD-10-CM | POA: Diagnosis not present

## 2018-09-20 DIAGNOSIS — Z79899 Other long term (current) drug therapy: Secondary | ICD-10-CM | POA: Diagnosis not present

## 2018-09-20 DIAGNOSIS — I10 Essential (primary) hypertension: Secondary | ICD-10-CM | POA: Diagnosis not present

## 2018-09-20 DIAGNOSIS — R52 Pain, unspecified: Secondary | ICD-10-CM | POA: Diagnosis not present

## 2018-09-20 DIAGNOSIS — S0181XA Laceration without foreign body of other part of head, initial encounter: Secondary | ICD-10-CM | POA: Insufficient documentation

## 2018-09-20 DIAGNOSIS — S199XXA Unspecified injury of neck, initial encounter: Secondary | ICD-10-CM | POA: Diagnosis not present

## 2018-09-20 DIAGNOSIS — Y999 Unspecified external cause status: Secondary | ICD-10-CM | POA: Insufficient documentation

## 2018-09-20 DIAGNOSIS — R58 Hemorrhage, not elsewhere classified: Secondary | ICD-10-CM | POA: Diagnosis not present

## 2018-09-20 DIAGNOSIS — E109 Type 1 diabetes mellitus without complications: Secondary | ICD-10-CM | POA: Insufficient documentation

## 2018-09-20 DIAGNOSIS — Z87891 Personal history of nicotine dependence: Secondary | ICD-10-CM | POA: Diagnosis not present

## 2018-09-20 DIAGNOSIS — Y9389 Activity, other specified: Secondary | ICD-10-CM | POA: Insufficient documentation

## 2018-09-20 DIAGNOSIS — W0110XA Fall on same level from slipping, tripping and stumbling with subsequent striking against unspecified object, initial encounter: Secondary | ICD-10-CM | POA: Insufficient documentation

## 2018-09-20 DIAGNOSIS — Y929 Unspecified place or not applicable: Secondary | ICD-10-CM | POA: Diagnosis not present

## 2018-09-20 DIAGNOSIS — R0902 Hypoxemia: Secondary | ICD-10-CM | POA: Diagnosis not present

## 2018-09-20 MED ORDER — LIDOCAINE-EPINEPHRINE (PF) 2 %-1:200000 IJ SOLN
20.0000 mL | Freq: Once | INTRAMUSCULAR | Status: AC
  Administered 2018-09-20: 20 mL
  Filled 2018-09-20: qty 20

## 2018-09-20 MED ORDER — TETANUS-DIPHTH-ACELL PERTUSSIS 5-2.5-18.5 LF-MCG/0.5 IM SUSP
0.5000 mL | Freq: Once | INTRAMUSCULAR | Status: AC
  Administered 2018-09-20: 0.5 mL via INTRAMUSCULAR
  Filled 2018-09-20: qty 0.5

## 2018-09-20 NOTE — ED Notes (Signed)
Pt has notable tremors upon assessment. Pt states this is not normal for him. Pt states he has been "falling a lot lately" and has an appointment to see a doctor Next Wednesday.
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
## 2018-09-20 NOTE — Telephone Encounter (Signed)
Contacted Angela at [facility] to inquire when the portable oxygen supply might be replenished since this has been a recent, but ongoing, issue and how the patient responded to this situation. She states the demand for portable O2 has increased during the pandemic and CMS has 'relaxed' it's requirements on qualifying patients for oxygen if it is a covid-related need.   She states the supply has been depleted and efforts are being made to increase the supply to meet this new demand.  She is unsure when the situation will be remedied but they will get restocked and the wife of this patient was understanding she says and offered to contact them each month to keep abreast of new stock.  Levada Dy will also be monitoring the situation to assist patients with supply needs.   Nothing further needed at this time.
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
## 2018-09-20 NOTE — Discharge Instructions (Addendum)
Absorbable sutures were placed.  Keep antibiotic on the wound daily.  The sutures will reabsorb on their own and do not have to have them removed.

## 2018-09-25 ENCOUNTER — Encounter: Payer: Self-pay | Admitting: Neurology

## 2018-09-25 ENCOUNTER — Other Ambulatory Visit: Payer: Self-pay

## 2018-09-25 ENCOUNTER — Ambulatory Visit (INDEPENDENT_AMBULATORY_CARE_PROVIDER_SITE_OTHER): Payer: Medicare Other | Admitting: Neurology

## 2018-09-25 VITALS — BP 157/66 | HR 72 | Temp 98.0°F | Ht 70.0 in | Wt 178.0 lb

## 2018-09-25 DIAGNOSIS — F1190 Opioid use, unspecified, uncomplicated: Secondary | ICD-10-CM | POA: Diagnosis not present

## 2018-09-25 DIAGNOSIS — F40240 Claustrophobia: Secondary | ICD-10-CM

## 2018-09-25 DIAGNOSIS — M5431 Sciatica, right side: Secondary | ICD-10-CM

## 2018-09-25 DIAGNOSIS — G4734 Idiopathic sleep related nonobstructive alveolar hypoventilation: Secondary | ICD-10-CM | POA: Diagnosis not present

## 2018-09-25 DIAGNOSIS — I739 Peripheral vascular disease, unspecified: Secondary | ICD-10-CM | POA: Diagnosis not present

## 2018-09-25 DIAGNOSIS — F5103 Paradoxical insomnia: Secondary | ICD-10-CM

## 2018-09-25 DIAGNOSIS — G4737 Central sleep apnea in conditions classified elsewhere: Secondary | ICD-10-CM

## 2018-09-25 NOTE — Patient Instructions (Signed)
Intermittent Claudication Intermittent claudication is pain in one or both legs that occurs when walking or exercising and goes away when resting. Intermittent claudication is a symptom of peripheral arterial disease (PAD). This condition is commonly treated with rest, medicine, and healthy lifestyle changes. If medical management does not improve symptoms, surgery can be done to restore blood flow (revascularization) to the affected leg. What are the causes?  This condition is caused by buildup of fatty material (plaque) within the major arteries in the body (atherosclerosis). Plaque makes arteries stiff and narrow, which prevents enough blood from reaching the leg muscles. Pain occurs when you walk or exercise because your muscles need (but cannot get) more blood when you are moving and exercising. What increases the risk? The following factors may make you more likely to develop this condition:  A family history of atherosclerosis.  A personal history of stroke or heart disease.  Older age.  Being inactive (sedentary lifestyle).  Being overweight.  Smoking cigarettes.  Having another health condition such as: ? Diabetes. ? High blood pressure. ? High cholesterol. What are the signs or symptoms? Symptoms of this condition may first develop in the lower leg, and then they may spread to the thigh, hip, buttock, or the back of the lower leg (calf) over time. Symptoms may include:  Aches or pains.  Cramps.  A feeling of tightness, weakness, or heaviness.  A wound on the lower leg or foot that heals poorly or does not heal. How is this diagnosed? This condition may be diagnosed based on:  Your symptoms.  Your medical history.  Tests, such as: ? Blood tests. ? Arterial duplex ultrasound. This test uses images of blood vessels and surrounding organs to evaluate blood flow within arteries. ? Angiogram. In this procedure, dye is injected into arteries and then X-rays are taken.  ? Magnetic resonance angiogram (MRA). In this procedure, strong magnets and radio waves are used instead of X-rays to create images of blood vessels and blood flow. ? CT angiogram (CTA). In this procedure, a large X-ray machine called a CT scanner takes detailed pictures of blood vessels that have been injected with dye. ? Ankle-brachial index (ABI) test. This procedure measures blood pressure in the leg during exercise and at rest. ? Exercise test. For this test, you will walk on a treadmill while tests are done (such as the ABI test) to evaluate how this condition affects your ability to walk or exercise. How is this treated? Treatment for this condition may involve treatment for the underlying cause, such as treatment for high blood pressure, high cholesterol, or diabetes. Treatment may include:  Lifestyle changes such as: ? Starting a supervised or home-based exercise program. ? Losing weight. ? Quitting smoking.  Medicines to help restore blood flow through your legs.  Blood vessel surgery (angioplasty) to restore blood flow around the blocked vessel. This is also known as endovascular therapy (EVT). This is only done if your intermittent claudication is caused by severe peripheral artery disease, a condition in which blood flow is severely or totally restricted by the narrowing of the arteries. Follow these instructions at home: Lifestyle   Maintain a healthy weight.  Eat a diet that is low in saturated fats and calories. Consider working with a diet and nutrition specialist (dietitian) to help you make healthy food choices.  Do not use any products that contain nicotine or tobacco, such as cigarettes and e-cigarettes. If you need help quitting, ask your health care provider.  If   your health care provider recommended an exercise program for you, follow it as directed. Your exercise program may involve: ? Walking 3 or more times a week. ? Walking until you have certain symptoms of  intermittent claudication. ? Resting until symptoms go away. ? Gradually increasing your walking time to about 50 minutes a day. General instructions  Work with your health care provider to manage any other health conditions you may have, including diabetes, high blood pressure, or high cholesterol.  Take over-the-counter and prescription medicines only as told by your health care provider.  Keep all follow-up visits as told by your health care provider. This is important. Contact a health care provider if:  Your pain does not go away with rest.  You have sores on your legs that do not heal or have a bad smell or pus coming from them.  Your condition gets worse or does not get better with treatment. Get help right away if:  You have chest pain.  You have difficulty breathing.  You develop arm weakness.  You have trouble speaking.  Your face begins to droop.  Your foot or leg is cold or it changes color.  Your foot or leg becomes numb. These symptoms may represent a serious problem that is an emergency. Do not wait to see if the symptoms will go away. Get medical help right away. Call your local emergency services (911 in the [place] Do not drive yourself to the hospital.  Summary  Intermittent claudication is pain in one or both legs that occurs when walking or exercising and goes away when resting.  This condition is caused by buildup of fatty material (plaque) within the major arteries in the body (atherosclerosis). Plaque makes arteries stiff and narrow, which prevents enough blood from reaching the leg muscles.  Intermittent claudication can be treated with medicine and lifestyle changes. If medical treatment fails, surgery can be done to help return blood flow to the affected area.  Make sure you work with your health care provider to manage any other health conditions you may have, including diabetes, high blood pressure, or high cholesterol. This information is not  intended to replace advice given to you by your health care provider. Make sure you discuss any questions you have with your health care provider. Document Released: 11/05/2003 Document Revised: 12/15/2016 Document Reviewed: 02/03/2016 Elsevier Patient Education  2020 [employer]  
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
## 2018-10-09 ENCOUNTER — Ambulatory Visit: Payer: 59 | Admitting: Adult Health

## 2018-10-29 ENCOUNTER — Telehealth: Payer: Self-pay | Admitting: Neurology

## 2018-10-29 ENCOUNTER — Encounter: Payer: 59 | Admitting: Neurology

## 2018-10-29 ENCOUNTER — Encounter: Payer: Self-pay | Admitting: Neurology

## 2018-10-29 NOTE — Telephone Encounter (Signed)
This patient did not show for an EMG and nerve conduction study appointment today.  He apparently also did not show for a revisit appointment on 14 August 2018.

## 2018-11-08 DIAGNOSIS — M19272 Secondary osteoarthritis, left ankle and foot: Secondary | ICD-10-CM | POA: Diagnosis not present

## 2018-11-08 DIAGNOSIS — M79672 Pain in left foot: Secondary | ICD-10-CM | POA: Diagnosis not present

## 2018-11-08 DIAGNOSIS — M25572 Pain in left ankle and joints of left foot: Secondary | ICD-10-CM | POA: Diagnosis not present

## 2018-11-08 DIAGNOSIS — E1140 Type 2 diabetes mellitus with diabetic neuropathy, unspecified: Secondary | ICD-10-CM | POA: Diagnosis not present

## 2018-11-14 DIAGNOSIS — J9610 Chronic respiratory failure, unspecified whether with hypoxia or hypercapnia: Secondary | ICD-10-CM | POA: Diagnosis not present

## 2018-11-14 DIAGNOSIS — J449 Chronic obstructive pulmonary disease, unspecified: Secondary | ICD-10-CM | POA: Diagnosis not present

## 2018-11-14 DIAGNOSIS — G4737 Central sleep apnea in conditions classified elsewhere: Secondary | ICD-10-CM | POA: Diagnosis not present

## 2018-11-14 DIAGNOSIS — Z794 Long term (current) use of insulin: Secondary | ICD-10-CM | POA: Diagnosis not present

## 2018-11-14 DIAGNOSIS — E1151 Type 2 diabetes mellitus with diabetic peripheral angiopathy without gangrene: Secondary | ICD-10-CM | POA: Diagnosis not present

## 2018-11-14 DIAGNOSIS — R5381 Other malaise: Secondary | ICD-10-CM | POA: Diagnosis not present

## 2018-11-14 DIAGNOSIS — I129 Hypertensive chronic kidney disease with stage 1 through stage 4 chronic kidney disease, or unspecified chronic kidney disease: Secondary | ICD-10-CM | POA: Diagnosis not present

## 2018-11-14 DIAGNOSIS — I739 Peripheral vascular disease, unspecified: Secondary | ICD-10-CM | POA: Diagnosis not present

## 2018-11-14 DIAGNOSIS — R2681 Unsteadiness on feet: Secondary | ICD-10-CM | POA: Diagnosis not present

## 2018-12-09 ENCOUNTER — Telehealth: Payer: Self-pay | Admitting: Neurology

## 2018-12-09 ENCOUNTER — Encounter: Payer: 59 | Admitting: Neurology

## 2018-12-09 ENCOUNTER — Encounter: Payer: Self-pay | Admitting: Neurology

## 2018-12-09 DIAGNOSIS — E1140 Type 2 diabetes mellitus with diabetic neuropathy, unspecified: Secondary | ICD-10-CM | POA: Diagnosis not present

## 2018-12-09 DIAGNOSIS — M25572 Pain in left ankle and joints of left foot: Secondary | ICD-10-CM | POA: Diagnosis not present

## 2018-12-09 DIAGNOSIS — M79672 Pain in left foot: Secondary | ICD-10-CM | POA: Diagnosis not present

## 2018-12-09 DIAGNOSIS — M19272 Secondary osteoarthritis, left ankle and foot: Secondary | ICD-10-CM | POA: Diagnosis not present

## 2018-12-09 NOTE — Telephone Encounter (Signed)
This patient did not show for an EMG and nerve conduction study evaluation today. 

## 2018-12-24 ENCOUNTER — Telehealth: Payer: Self-pay | Admitting: Neurology

## 2018-12-24 NOTE — Telephone Encounter (Signed)
Pt wife(Janice Gatley on DPR) has called stating that due to her not being able to come into the office with pt she would like to know if RN can call her during or after the appointment.  Wife feels pt is not likely to recall all that is said to him during the office visit

## 2018-12-25 ENCOUNTER — Ambulatory Visit: Payer: 59 | Admitting: Family Medicine

## 2018-12-26 NOTE — Telephone Encounter (Signed)
Spoke to pts wife and relayed that per AL/NP not a problem.  Will call her when he is here for appt.  See appt edit notes.

## 2018-12-26 NOTE — Telephone Encounter (Signed)
No problem! Please have patient remind me at visit.

## 2019-01-07 ENCOUNTER — Telehealth: Payer: Self-pay | Admitting: Neurology

## 2019-01-07 ENCOUNTER — Encounter: Payer: 59 | Admitting: Neurology

## 2019-01-07 NOTE — Telephone Encounter (Signed)
This patient did not show for EMG and nerve conduction study evaluation today.  He did not show for similar studies on 29 October 2018 and on 09 December 2018 and he no showed for a revisit on 13 August 2018.  The patient will be discharged from our practice.

## 2019-01-08 ENCOUNTER — Encounter: Payer: Self-pay | Admitting: Neurology

## 2019-02-10 DIAGNOSIS — M25572 Pain in left ankle and joints of left foot: Secondary | ICD-10-CM | POA: Diagnosis not present

## 2019-02-10 DIAGNOSIS — E1140 Type 2 diabetes mellitus with diabetic neuropathy, unspecified: Secondary | ICD-10-CM | POA: Diagnosis not present

## 2019-02-10 DIAGNOSIS — M79672 Pain in left foot: Secondary | ICD-10-CM | POA: Diagnosis not present

## 2019-02-10 DIAGNOSIS — M19272 Secondary osteoarthritis, left ankle and foot: Secondary | ICD-10-CM | POA: Diagnosis not present

## 2019-02-19 ENCOUNTER — Ambulatory Visit: Payer: 59 | Admitting: Family Medicine

## 2019-02-20 DIAGNOSIS — Z1331 Encounter for screening for depression: Secondary | ICD-10-CM | POA: Diagnosis not present

## 2019-02-20 DIAGNOSIS — Z720 Tobacco use: Secondary | ICD-10-CM | POA: Diagnosis not present

## 2019-02-20 DIAGNOSIS — F329 Major depressive disorder, single episode, unspecified: Secondary | ICD-10-CM | POA: Diagnosis not present

## 2019-02-20 DIAGNOSIS — J9610 Chronic respiratory failure, unspecified whether with hypoxia or hypercapnia: Secondary | ICD-10-CM | POA: Diagnosis not present

## 2019-02-20 DIAGNOSIS — N1831 Chronic kidney disease, stage 3a: Secondary | ICD-10-CM | POA: Diagnosis not present

## 2019-02-20 DIAGNOSIS — M545 Low back pain: Secondary | ICD-10-CM | POA: Diagnosis not present

## 2019-02-20 DIAGNOSIS — I739 Peripheral vascular disease, unspecified: Secondary | ICD-10-CM | POA: Diagnosis not present

## 2019-02-20 DIAGNOSIS — E1151 Type 2 diabetes mellitus with diabetic peripheral angiopathy without gangrene: Secondary | ICD-10-CM | POA: Diagnosis not present

## 2019-02-20 DIAGNOSIS — R2681 Unsteadiness on feet: Secondary | ICD-10-CM | POA: Diagnosis not present

## 2019-02-20 DIAGNOSIS — J449 Chronic obstructive pulmonary disease, unspecified: Secondary | ICD-10-CM | POA: Diagnosis not present

## 2019-03-01 DIAGNOSIS — Z23 Encounter for immunization: Secondary | ICD-10-CM | POA: Diagnosis not present

## 2019-03-17 DIAGNOSIS — R2681 Unsteadiness on feet: Secondary | ICD-10-CM | POA: Diagnosis not present

## 2019-03-17 DIAGNOSIS — Z789 Other specified health status: Secondary | ICD-10-CM | POA: Diagnosis not present

## 2019-03-17 DIAGNOSIS — G8929 Other chronic pain: Secondary | ICD-10-CM | POA: Diagnosis not present

## 2019-03-17 DIAGNOSIS — Z7409 Other reduced mobility: Secondary | ICD-10-CM | POA: Diagnosis not present

## 2019-03-17 DIAGNOSIS — M545 Low back pain: Secondary | ICD-10-CM | POA: Diagnosis not present

## 2019-03-20 DIAGNOSIS — R2681 Unsteadiness on feet: Secondary | ICD-10-CM | POA: Diagnosis not present

## 2019-03-20 DIAGNOSIS — Z789 Other specified health status: Secondary | ICD-10-CM | POA: Diagnosis not present

## 2019-03-20 DIAGNOSIS — Z7409 Other reduced mobility: Secondary | ICD-10-CM | POA: Diagnosis not present

## 2019-03-20 DIAGNOSIS — M545 Low back pain: Secondary | ICD-10-CM | POA: Diagnosis not present

## 2019-03-20 DIAGNOSIS — G8929 Other chronic pain: Secondary | ICD-10-CM | POA: Diagnosis not present

## 2019-03-24 DIAGNOSIS — R2681 Unsteadiness on feet: Secondary | ICD-10-CM | POA: Diagnosis not present

## 2019-03-24 DIAGNOSIS — Z789 Other specified health status: Secondary | ICD-10-CM | POA: Diagnosis not present

## 2019-03-24 DIAGNOSIS — M545 Low back pain: Secondary | ICD-10-CM | POA: Diagnosis not present

## 2019-03-24 DIAGNOSIS — G8929 Other chronic pain: Secondary | ICD-10-CM | POA: Diagnosis not present

## 2019-03-24 DIAGNOSIS — Z7409 Other reduced mobility: Secondary | ICD-10-CM | POA: Diagnosis not present

## 2019-03-29 DIAGNOSIS — Z23 Encounter for immunization: Secondary | ICD-10-CM | POA: Diagnosis not present

## 2019-03-30 ENCOUNTER — Emergency Department (HOSPITAL_COMMUNITY): Payer: Medicare Other

## 2019-03-30 ENCOUNTER — Observation Stay (HOSPITAL_COMMUNITY)
Admission: EM | Admit: 2019-03-30 | Discharge: 2019-04-01 | Disposition: A | Discharge status: Home / Self Care | Payer: Medicare Other | Attending: Internal Medicine | Admitting: Internal Medicine

## 2019-03-30 ENCOUNTER — Encounter (HOSPITAL_COMMUNITY): Payer: Self-pay

## 2019-03-30 ENCOUNTER — Other Ambulatory Visit: Payer: Self-pay

## 2019-03-30 DIAGNOSIS — F329 Major depressive disorder, single episode, unspecified: Secondary | ICD-10-CM | POA: Insufficient documentation

## 2019-03-30 DIAGNOSIS — I129 Hypertensive chronic kidney disease with stage 1 through stage 4 chronic kidney disease, or unspecified chronic kidney disease: Secondary | ICD-10-CM | POA: Insufficient documentation

## 2019-03-30 DIAGNOSIS — R0902 Hypoxemia: Secondary | ICD-10-CM | POA: Diagnosis not present

## 2019-03-30 DIAGNOSIS — N189 Chronic kidney disease, unspecified: Secondary | ICD-10-CM | POA: Diagnosis not present

## 2019-03-30 DIAGNOSIS — E1022 Type 1 diabetes mellitus with diabetic chronic kidney disease: Secondary | ICD-10-CM | POA: Insufficient documentation

## 2019-03-30 DIAGNOSIS — Z833 Family history of diabetes mellitus: Secondary | ICD-10-CM | POA: Insufficient documentation

## 2019-03-30 DIAGNOSIS — I872 Venous insufficiency (chronic) (peripheral): Secondary | ICD-10-CM | POA: Insufficient documentation

## 2019-03-30 DIAGNOSIS — Z881 Allergy status to other antibiotic agents status: Secondary | ICD-10-CM | POA: Diagnosis not present

## 2019-03-30 DIAGNOSIS — D509 Iron deficiency anemia, unspecified: Secondary | ICD-10-CM | POA: Insufficient documentation

## 2019-03-30 DIAGNOSIS — I10 Essential (primary) hypertension: Secondary | ICD-10-CM | POA: Diagnosis not present

## 2019-03-30 DIAGNOSIS — Z885 Allergy status to narcotic agent status: Secondary | ICD-10-CM | POA: Insufficient documentation

## 2019-03-30 DIAGNOSIS — R0689 Other abnormalities of breathing: Secondary | ICD-10-CM | POA: Diagnosis not present

## 2019-03-30 DIAGNOSIS — D696 Thrombocytopenia, unspecified: Secondary | ICD-10-CM | POA: Diagnosis not present

## 2019-03-30 DIAGNOSIS — H9193 Unspecified hearing loss, bilateral: Secondary | ICD-10-CM | POA: Insufficient documentation

## 2019-03-30 DIAGNOSIS — J9611 Chronic respiratory failure with hypoxia: Secondary | ICD-10-CM | POA: Diagnosis not present

## 2019-03-30 DIAGNOSIS — N179 Acute kidney failure, unspecified: Secondary | ICD-10-CM | POA: Diagnosis not present

## 2019-03-30 DIAGNOSIS — Z7982 Long term (current) use of aspirin: Secondary | ICD-10-CM | POA: Insufficient documentation

## 2019-03-30 DIAGNOSIS — G4731 Primary central sleep apnea: Secondary | ICD-10-CM | POA: Insufficient documentation

## 2019-03-30 DIAGNOSIS — R509 Fever, unspecified: Secondary | ICD-10-CM | POA: Diagnosis not present

## 2019-03-30 DIAGNOSIS — I6782 Cerebral ischemia: Secondary | ICD-10-CM | POA: Insufficient documentation

## 2019-03-30 DIAGNOSIS — Z20822 Contact with and (suspected) exposure to covid-19: Secondary | ICD-10-CM | POA: Diagnosis not present

## 2019-03-30 DIAGNOSIS — I959 Hypotension, unspecified: Secondary | ICD-10-CM | POA: Diagnosis not present

## 2019-03-30 DIAGNOSIS — E10319 Type 1 diabetes mellitus with unspecified diabetic retinopathy without macular edema: Secondary | ICD-10-CM | POA: Insufficient documentation

## 2019-03-30 DIAGNOSIS — R4182 Altered mental status, unspecified: Secondary | ICD-10-CM | POA: Diagnosis not present

## 2019-03-30 DIAGNOSIS — G894 Chronic pain syndrome: Secondary | ICD-10-CM | POA: Insufficient documentation

## 2019-03-30 DIAGNOSIS — Z9981 Dependence on supplemental oxygen: Secondary | ICD-10-CM | POA: Insufficient documentation

## 2019-03-30 DIAGNOSIS — E785 Hyperlipidemia, unspecified: Secondary | ICD-10-CM | POA: Insufficient documentation

## 2019-03-30 DIAGNOSIS — M1990 Unspecified osteoarthritis, unspecified site: Secondary | ICD-10-CM | POA: Insufficient documentation

## 2019-03-30 DIAGNOSIS — E10649 Type 1 diabetes mellitus with hypoglycemia without coma: Secondary | ICD-10-CM | POA: Diagnosis not present

## 2019-03-30 DIAGNOSIS — G9340 Encephalopathy, unspecified: Secondary | ICD-10-CM | POA: Diagnosis not present

## 2019-03-30 DIAGNOSIS — R404 Transient alteration of awareness: Secondary | ICD-10-CM | POA: Diagnosis not present

## 2019-03-30 DIAGNOSIS — Z79899 Other long term (current) drug therapy: Secondary | ICD-10-CM | POA: Insufficient documentation

## 2019-03-30 DIAGNOSIS — Z7951 Long term (current) use of inhaled steroids: Secondary | ICD-10-CM | POA: Insufficient documentation

## 2019-03-30 DIAGNOSIS — R7989 Other specified abnormal findings of blood chemistry: Secondary | ICD-10-CM | POA: Diagnosis not present

## 2019-03-30 DIAGNOSIS — Z794 Long term (current) use of insulin: Secondary | ICD-10-CM | POA: Insufficient documentation

## 2019-03-30 DIAGNOSIS — E1042 Type 1 diabetes mellitus with diabetic polyneuropathy: Secondary | ICD-10-CM | POA: Insufficient documentation

## 2019-03-30 DIAGNOSIS — Z8249 Family history of ischemic heart disease and other diseases of the circulatory system: Secondary | ICD-10-CM | POA: Insufficient documentation

## 2019-03-30 LAB — URINALYSIS, ROUTINE W REFLEX MICROSCOPIC
Bacteria, UA: NONE SEEN
Bilirubin Urine: NEGATIVE
Glucose, UA: NEGATIVE mg/dL
Ketones, ur: NEGATIVE mg/dL
Leukocytes,Ua: NEGATIVE
Nitrite: NEGATIVE
Protein, ur: 100 mg/dL — AB
Specific Gravity, Urine: 1.01 (ref 1.005–1.030)
pH: 7 (ref 5.0–8.0)

## 2019-03-30 LAB — BLOOD GAS, ARTERIAL
Acid-Base Excess: 6.4 mmol/L — ABNORMAL HIGH (ref 0.0–2.0)
Bicarbonate: 30.6 mmol/L — ABNORMAL HIGH (ref 20.0–28.0)
FIO2: 28
O2 Saturation: 89.2 %
Patient temperature: 98.6
pCO2 arterial: 43.5 mmHg (ref 32.0–48.0)
pH, Arterial: 7.46 — ABNORMAL HIGH (ref 7.350–7.450)
pO2, Arterial: 55.7 mmHg — ABNORMAL LOW (ref 83.0–108.0)

## 2019-03-30 LAB — CBC WITH DIFFERENTIAL/PLATELET
Abs Immature Granulocytes: 0.02 10*3/uL (ref 0.00–0.07)
Basophils Absolute: 0 10*3/uL (ref 0.0–0.1)
Basophils Relative: 0 %
Eosinophils Absolute: 0.1 10*3/uL (ref 0.0–0.5)
Eosinophils Relative: 2 %
HCT: 34.8 % — ABNORMAL LOW (ref 39.0–52.0)
Hemoglobin: 11.4 g/dL — ABNORMAL LOW (ref 13.0–17.0)
Immature Granulocytes: 0 %
Lymphocytes Relative: 11 %
Lymphs Abs: 0.5 10*3/uL — ABNORMAL LOW (ref 0.7–4.0)
MCH: 28.9 pg (ref 26.0–34.0)
MCHC: 32.8 g/dL (ref 30.0–36.0)
MCV: 88.3 fL (ref 80.0–100.0)
Monocytes Absolute: 0.4 10*3/uL (ref 0.1–1.0)
Monocytes Relative: 8 %
Neutro Abs: 3.6 10*3/uL (ref 1.7–7.7)
Neutrophils Relative %: 79 %
Platelets: 131 10*3/uL — ABNORMAL LOW (ref 150–400)
RBC: 3.94 MIL/uL — ABNORMAL LOW (ref 4.22–5.81)
RDW: 14 % (ref 11.5–15.5)
WBC: 4.5 10*3/uL (ref 4.0–10.5)
nRBC: 0 % (ref 0.0–0.2)

## 2019-03-30 LAB — COMPREHENSIVE METABOLIC PANEL
ALT: 11 U/L (ref 0–44)
AST: 18 U/L (ref 15–41)
Albumin: 3.7 g/dL (ref 3.5–5.0)
Alkaline Phosphatase: 70 U/L (ref 38–126)
Anion gap: 9 (ref 5–15)
BUN: 33 mg/dL — ABNORMAL HIGH (ref 8–23)
CO2: 32 mmol/L (ref 22–32)
Calcium: 8.5 mg/dL — ABNORMAL LOW (ref 8.9–10.3)
Chloride: 96 mmol/L — ABNORMAL LOW (ref 98–111)
Creatinine, Ser: 1.69 mg/dL — ABNORMAL HIGH (ref 0.61–1.24)
GFR calc Af Amer: 48 mL/min — ABNORMAL LOW (ref >60)
GFR calc non Af Amer: 42 mL/min — ABNORMAL LOW (ref >60)
Glucose, Bld: 119 mg/dL — ABNORMAL HIGH (ref 70–99)
Potassium: 4 mmol/L (ref 3.5–5.1)
Sodium: 137 mmol/L (ref 135–145)
Total Bilirubin: 0.8 mg/dL (ref 0.3–1.2)
Total Protein: 6.7 g/dL (ref 6.5–8.1)

## 2019-03-30 LAB — SARS CORONAVIRUS 2 (TAT 6-24 HRS): SARS Coronavirus 2: NEGATIVE

## 2019-03-30 LAB — PROTIME-INR
INR: 1.1 (ref 0.8–1.2)
Prothrombin Time: 14 seconds (ref 11.4–15.2)

## 2019-03-30 LAB — LACTIC ACID, PLASMA: Lactic Acid, Venous: 1.5 mmol/L (ref 0.5–1.9)

## 2019-03-30 LAB — APTT: aPTT: 31 seconds (ref 24–36)

## 2019-03-30 LAB — VALPROIC ACID LEVEL: Valproic Acid Lvl: 16 ug/mL — ABNORMAL LOW (ref 50.0–100.0)

## 2019-03-30 LAB — GLUCOSE, CAPILLARY: Glucose-Capillary: 81 mg/dL (ref 70–99)

## 2019-03-30 MED ORDER — ONDANSETRON HCL 4 MG/2ML IJ SOLN: 4.0000 mg | Freq: Four times a day (QID) | INTRAMUSCULAR | Status: DC | PRN

## 2019-03-30 MED ORDER — HYDROCHLOROTHIAZIDE 25 MG PO TABS
25.0000 mg | ORAL_TABLET | Freq: Every day | ORAL | Status: DC
  Administered 2019-03-31: 25 mg via ORAL
  Filled 2019-03-30: qty 1

## 2019-03-30 MED ORDER — INSULIN ASPART 100 UNIT/ML ~~LOC~~ SOLN
0.0000 [IU] | Freq: Every day | SUBCUTANEOUS | Status: DC
  Filled 2019-03-30: qty 0.05

## 2019-03-30 MED ORDER — SODIUM CHLORIDE 0.9 % IV SOLN
1000.0000 mL | INTRAVENOUS | Status: DC
  Administered 2019-03-30 (×2): 1000 mL via INTRAVENOUS

## 2019-03-30 MED ORDER — SERTRALINE HCL 50 MG PO TABS
50.0000 mg | ORAL_TABLET | Freq: Every day | ORAL | Status: DC
  Administered 2019-03-30 – 2019-03-31 (×2): 50 mg via ORAL
  Filled 2019-03-30 (×2): qty 1

## 2019-03-30 MED ORDER — METOPROLOL TARTRATE 50 MG PO TABS
50.0000 mg | ORAL_TABLET | Freq: Two times a day (BID) | ORAL | Status: DC
  Administered 2019-03-31 – 2019-04-01 (×2): 50 mg via ORAL
  Filled 2019-03-30 (×2): qty 1

## 2019-03-30 MED ORDER — SIMVASTATIN 20 MG PO TABS
20.0000 mg | ORAL_TABLET | Freq: Every day | ORAL | Status: DC
  Administered 2019-03-30 – 2019-03-31 (×2): 20 mg via ORAL
  Filled 2019-03-30 (×2): qty 1

## 2019-03-30 MED ORDER — LORAZEPAM 2 MG/ML IJ SOLN
1.0000 mg | Freq: Once | INTRAMUSCULAR | Status: AC
  Administered 2019-03-30: 1 mg via INTRAVENOUS
  Filled 2019-03-30: qty 1

## 2019-03-30 MED ORDER — ACETAMINOPHEN 325 MG PO TABS: 650.0000 mg | ORAL_TABLET | Freq: Four times a day (QID) | ORAL | Status: DC | PRN

## 2019-03-30 MED ORDER — AMLODIPINE BESYLATE 5 MG PO TABS
5.0000 mg | ORAL_TABLET | Freq: Every day | ORAL | Status: DC
  Administered 2019-03-30 – 2019-04-01 (×3): 5 mg via ORAL
  Filled 2019-03-30 (×3): qty 1

## 2019-03-30 MED ORDER — HYDROCODONE-ACETAMINOPHEN 5-325 MG PO TABS: 1.0000 | ORAL_TABLET | Freq: Four times a day (QID) | ORAL | Status: DC | PRN

## 2019-03-30 MED ORDER — ENOXAPARIN SODIUM 40 MG/0.4ML ~~LOC~~ SOLN
40.0000 mg | SUBCUTANEOUS | Status: DC
  Administered 2019-03-30 – 2019-03-31 (×2): 40 mg via SUBCUTANEOUS
  Filled 2019-03-30 (×2): qty 0.4

## 2019-03-30 MED ORDER — INSULIN ASPART 100 UNIT/ML ~~LOC~~ SOLN
4.0000 [IU] | Freq: Three times a day (TID) | SUBCUTANEOUS | Status: DC
  Administered 2019-03-31 (×2): 4 [IU] via SUBCUTANEOUS
  Filled 2019-03-30: qty 0.04

## 2019-03-30 MED ORDER — INSULIN ASPART 100 UNIT/ML ~~LOC~~ SOLN
0.0000 [IU] | Freq: Three times a day (TID) | SUBCUTANEOUS | Status: DC
  Administered 2019-04-01 (×2): 2 [IU] via SUBCUTANEOUS
  Filled 2019-03-30: qty 0.15

## 2019-03-30 MED ORDER — ONDANSETRON HCL 4 MG PO TABS: 4.0000 mg | ORAL_TABLET | Freq: Four times a day (QID) | ORAL | Status: DC | PRN

## 2019-03-30 MED ORDER — DIVALPROEX SODIUM 250 MG PO DR TAB
250.0000 mg | DELAYED_RELEASE_TABLET | Freq: Every day | ORAL | Status: DC
  Administered 2019-03-30 – 2019-03-31 (×2): 250 mg via ORAL
  Filled 2019-03-30 (×2): qty 1

## 2019-03-30 MED ORDER — NALOXONE HCL 0.4 MG/ML IJ SOLN
0.4000 mg | Freq: Once | INTRAMUSCULAR | Status: AC
  Administered 2019-03-30: 0.4 mg via INTRAVENOUS
  Filled 2019-03-30: qty 1

## 2019-03-30 MED ORDER — ACETAMINOPHEN 650 MG RE SUPP: 650.0000 mg | Freq: Four times a day (QID) | RECTAL | Status: DC | PRN

## 2019-03-30 MED ORDER — POLYETHYLENE GLYCOL 3350 17 G PO PACK: 17.0000 g | PACK | Freq: Every day | ORAL | Status: DC | PRN

## 2019-03-30 MED ORDER — ASPIRIN EC 81 MG PO TBEC
81.0000 mg | DELAYED_RELEASE_TABLET | Freq: Every day | ORAL | Status: DC
  Administered 2019-03-31 – 2019-04-01 (×2): 81 mg via ORAL
  Filled 2019-03-30 (×2): qty 1

## 2019-03-30 MED ORDER — DOXEPIN HCL 25 MG PO CAPS: 25.0000 mg | ORAL_CAPSULE | Freq: Every day | ORAL | Status: DC

## 2019-03-30 MED ORDER — INSULIN GLARGINE 100 UNIT/ML ~~LOC~~ SOLN
16.0000 [IU] | Freq: Every day | SUBCUTANEOUS | Status: DC
  Administered 2019-03-30: 16 [IU] via SUBCUTANEOUS
  Filled 2019-03-30 (×3): qty 0.16

## 2019-03-30 MED ORDER — NORTRIPTYLINE HCL 25 MG PO CAPS: 25.0000 mg | ORAL_CAPSULE | Freq: Every day | ORAL | Status: DC

## 2019-03-30 MED ORDER — LABETALOL HCL 5 MG/ML IV SOLN: 10.0000 mg | Freq: Four times a day (QID) | INTRAVENOUS | Status: DC | PRN

## 2019-03-30 MED ORDER — LOSARTAN POTASSIUM 50 MG PO TABS
100.0000 mg | ORAL_TABLET | Freq: Every day | ORAL | Status: DC
  Administered 2019-03-30 – 2019-04-01 (×3): 100 mg via ORAL
  Filled 2019-03-30 (×3): qty 2

## 2019-03-30 MED ORDER — METHADONE HCL 10 MG PO TABS
20.0000 mg | ORAL_TABLET | Freq: Two times a day (BID) | ORAL | Status: DC
  Administered 2019-03-30 – 2019-04-01 (×4): 20 mg via ORAL
  Filled 2019-03-30 (×4): qty 2

## 2019-03-30 MED ORDER — GABAPENTIN 300 MG PO CAPS
300.0000 mg | ORAL_CAPSULE | Freq: Three times a day (TID) | ORAL | Status: DC
  Administered 2019-03-30 – 2019-04-01 (×6): 300 mg via ORAL
  Filled 2019-03-30 (×6): qty 1

## 2019-03-30 MED ORDER — METOPROLOL TARTRATE 25 MG PO TABS
25.0000 mg | ORAL_TABLET | Freq: Every day | ORAL | Status: DC
  Administered 2019-03-30: 25 mg via ORAL
  Filled 2019-03-30: qty 1
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
## 2019-03-30 NOTE — Progress Notes (Signed)
Patient returned from MRI. MRI was not completed. Patient unable to tolerate lying flat due to neck pain. MRI to be attempted again tomorrow using a different position technique

## 2019-03-30 NOTE — ED Triage Notes (Signed)
EMS reports from home, fever and lethargy since yesterday. Pt states he had his second Covid vaccination yesterday at 1800.  BP 160/60 HR 70 RR 40 Sp02 89 RA Temp 102.0 CBG 130  20 L forearm  419ml NS enroute

## 2019-03-30 NOTE — H&P (Signed)
History and Physical  [name] [id number] DOB: 07-24-1955 DOA: 03/30/2019   PCP: Leanna Battles, MD   Patient coming from: Home with wife   Chief Complaint: AMS, fever   HPI: [name] is a 64 y.o. male with medical history significant for diabetes, HTN, HLD being admitted for evaluation of fever and altered mental status. My history is taken from medical record and my discussion with the patient's ER MD and RN, as the patient is quite confused. Apparently his wife thinks he had COVID in March 2020 although he tested negative for it. Since that time she describes a slow decline in his health. This AM when he got up he was shaky and more disoriented that usual, and wife called EMS. Apparently on their arrival he was febrile.    On my interview with the patient he complains of neck pain but is unable to tell me how long it has been going on. He knows where he is and who he is but is otherwise unable to provide any history or explain why he is in the ER.  ED Course: In the ER, he was remained afebrile, confused, and workup with labs is as below. Bedside LP was attempted unsuccessfully, he has not been given any antibiotics and hospitalist was contacted for admission and further workup.  Review of Systems: Please see HPI for pertinent positives and negatives. A complete 10 system review of systems was not possible due to the patient's condition.  Past Medical History:  Diagnosis Date  . Anxiety   . Arthritis   . Chronic narcotic use   . Complication of anesthesia    h/o aspiration during back surgery  . Depression   . Diabetic peripheral neuropathy ([place])   . Hearing loss    wears bilateral hearing aids, bilat moderate SNHL  . Hyperlipidemia   . Hypertension   . Insomnia   . Tobacco abuse   . Type 1 diabetes mellitus ([place])   . Ulcer of left ankle ([place])   . Vision abnormalities    mild DM retinopathy   Past Surgical History:  Procedure Laterality Date  . COCHLEAR  IMPLANT Left 10/31/2017   Procedure: COCHLEAR IMPLANT LEFT EAR;  Surgeon: Vicie Mutters, MD;  Location: [facility];  Service: ENT;  Laterality: Left;  . COLONOSCOPY WITH PROPOFOL  07-26-2010  . [place] had 16 tons of steel rolled over his feet  . LUMBAR LAMINECTOMY/DECOMPRESSION MICRODISCECTOMY  12-23-1997   LEFT  L5 -- S1  . TONSILLECTOMY      Social History:  reports that he quit smoking about 13 months ago. His smoking use included cigarettes. He has a 40.00 pack-year smoking history. He has never used smokeless tobacco. He reports that he does not drink alcohol or use drugs.   Allergies  Allergen Reactions  . Codeine Nausea And Vomiting  . Tetracyclines & Related Hives    Family History  Problem Relation Age of Onset  . Prostate cancer Paternal Uncle   . Prostate cancer Maternal Grandfather   . Diabetes Mother   . Hypertension Mother   . Diabetes Father   . Heart failure Father      Prior to Admission medications   Medication Sig Start Date End Date Taking? Authorizing Provider  ALPRAZolam Duanne Moron) 0.5 MG tablet Take 0.5 mg by mouth at bedtime as needed for anxiety.    [provider]  amLODipine (NORVASC) 5 MG tablet Take 5 mg by  mouth daily.    [provider]  aspirin EC 81 MG tablet Take 81 mg by mouth daily.    [provider]  divalproex (DEPAKOTE) 250 MG DR tablet Take 250 mg by mouth daily.    [provider]  doxepin (SINEQUAN) 25 MG capsule Take 25 mg by mouth at bedtime.    [provider]  Fluticasone-Umeclidin-Vilant (TRELEGY ELLIPTA) 100-62.5-25 MCG/INH AEPB Inhale 1 puff into the lungs daily. 09/09/18   Magdalen Spatz, NP  furosemide (LASIX) 20 MG tablet TAKE 1 TABLET BY MOUTH EVERY DAY Patient taking differently: Take 20 mg by mouth daily.  08/05/18   Miquel Dunn, NP  gabapentin (NEURONTIN) 300 MG capsule Take 300 mg by mouth 3 (three) times daily.     [provider]    glipiZIDE (GLUCOTROL XL) 2.5 MG 24 hr tablet Take 2.5-5 mg by mouth See admin instructions. Take 2 tablets (5 mg) by mouth with breakfast and 1 tablet (2.5 mg) with supper    [provider]  hydrochlorothiazide (HYDRODIURIL) 25 MG tablet Take 25 mg by mouth daily.    [provider]  HYDROcodone-acetaminophen (NORCO/VICODIN) 5-325 MG tablet Take 1 tablet by mouth every 6 (six) hours as needed for moderate pain.     [provider]  Insulin Glargine (BASAGLAR KWIKPEN) 100 UNIT/ML SOPN Inject 16 Units into the skin daily. In morning    [provider]  losartan (COZAAR) 100 MG tablet Take 100 mg by mouth daily.    [provider]  metFORMIN (GLUCOPHAGE-XR) 500 MG 24 hr tablet Take 500 mg by mouth 2 (two) times a day.     [provider]  methadone (DOLOPHINE) 5 MG tablet Take 10 mg by mouth 4 (four) times daily.     [provider]  metoprolol tartrate (LOPRESSOR) 25 MG tablet Take 25-50 mg by mouth See admin instructions. Take 2 tablets (50 mg) by mouth every morning and 1 tablet (25 mg) at bedtime    [provider]  nortriptyline (PAMELOR) 25 MG capsule Take 25 mg by mouth at bedtime.      [provider]  OXYGEN Inhale into the lungs. 3 liters    [provider]  potassium citrate (UROCIT-K) 10 MEQ (1080 MG) SR tablet Take 10 mEq by mouth daily.    [provider]  sertraline (ZOLOFT) 50 MG tablet Take 50 mg by mouth at bedtime.     [provider]  simvastatin (ZOCOR) 20 MG tablet Take 20 mg by mouth at bedtime.      [provider]  umeclidinium-vilanterol (ANORO ELLIPTA) 62.5-25 MCG/INH AEPB Inhale 1 puff into the lungs daily.    [provider]    Physical Exam: BP (!) 171/52   Pulse 62   Temp 98.6 F (37 C) (Oral)   Resp 17   SpO2 96%   General:  Alert, oriented to self, calm, in no acute distress  Eyes: EOMI, clear conjuctivae, [place] sclerea Neck: supple,  no masses, trachea mildline  Cardiovascular: RRR, no murmurs or rubs, no peripheral edema  Respiratory: clear to auscultation bilaterally, no wheezes, no crackles  Abdomen: soft, nontender, nondistended, normal bowel tones heard  Skin: dry, no rashes  Musculoskeletal: no joint effusions, normal range of motion  Psychiatric: appropriate affect, normal speech  Neurologic: extraocular muscles intact, clear speech, moving all extremities with intact sensorium           Labs on Admission:  Basic Metabolic Panel:  Recent Labs  Lab 03/30/19 1231  NA 137  K 4.0  CL 96*  CO2 32  GLUCOSE 119*  BUN 33*  CREATININE 1.69*  CALCIUM 8.5*   Liver Function Tests: Recent Labs  Lab 03/30/19 1231  AST 18  ALT 11  ALKPHOS 70  BILITOT 0.8  PROT 6.7  ALBUMIN 3.7   No results for input(s): LIPASE, AMYLASE in the last 168 hours. No results for input(s): AMMONIA in the last 168 hours. CBC: Recent Labs  Lab 03/30/19 1231  WBC 4.5  NEUTROABS 3.6  HGB 11.4*  HCT 34.8*  MCV 88.3  PLT 131*   Cardiac Enzymes: No results for input(s): CKTOTAL, CKMB, CKMBINDEX, TROPONINI in the last 168 hours.  BNP (last 3 results) No results for input(s): BNP in the last 8760 hours.  ProBNP (last 3 results) No results for input(s): PROBNP in the last 8760 hours.  CBG: No results for input(s): GLUCAP in the last 168 hours.  Radiological Exams on Admission: CT Head Wo Contrast  Result Date: 03/30/2019 CLINICAL DATA:  Altered mental status. Fever. Confusion. Second COVID vaccination yesterday. No reported injury. EXAM: CT HEAD WITHOUT CONTRAST TECHNIQUE: Contiguous axial images were obtained from the base of the skull through the vertex without intravenous contrast. COMPARISON:  09/20/2018 head CT. FINDINGS: Motion degraded scan, limiting assessment. Left cerebrum obscured by streak artifact from left cochlear implant. Brain: No evidence of parenchymal hemorrhage or extra-axial fluid collection. No mass  lesion, mass effect, or midline shift. No CT evidence of acute infarction. Nonspecific mild subcortical and periventricular [place] matter hypodensity, most in keeping with chronic small vessel ischemic change. Cerebral volume is age appropriate. No ventriculomegaly. Vascular: No acute abnormality. Skull: No evidence of calvarial fracture. Stable position of left cochlear implant device along the outer table of the lateral left calvarium. Sinuses/Orbits: The visualized paranasal sinuses are essentially clear. Other: Small partial left mastoid effusion is unchanged. Clear right mastoid air cells. IMPRESSION: 1. Limited motion degraded scan. 2. No evidence of acute intracranial abnormality. No evidence of calvarial fracture. 3. Mild chronic small vessel ischemic changes in the cerebral [place] matter. 4. Chronic nonspecific small partial left mastoid effusion. Stable left cochlear implant. Electronically Signed   By: Ilona Sorrel M.D.   On: 03/30/2019 13:37   DG Chest Port 1 View  Result Date: 03/30/2019 CLINICAL DATA:  Fever, lethargy EXAM: PORTABLE CHEST 1 VIEW COMPARISON:  07/05/2018 chest radiograph. FINDINGS: Stable cardiomediastinal silhouette with normal heart size. No pneumothorax. No pleural effusion. Lungs appear clear, with no acute consolidative airspace disease and no pulmonary edema. IMPRESSION: No active disease. Electronically Signed   By: Ilona Sorrel M.D.   On: 03/30/2019 13:29   Assessment/Plan Present on Admission: . AMS (altered mental status)  Active Problems:   AMS (altered mental status) - with reported fever but none here, etiology of his AMS unknown at this time and per wife is chronic for about the last year. LP was attempted but unsuccessful in ER. Could also be medication related. - observation admission - hold abx for now - check MRI brain WO contrast - consider inpatient Neurology consultation if not improving - consider LP/empiric abx if fever recurs - await COVID  swab  HTN - currently uncontrolled - continue home Norvasc, HCTZ, Cozaar, Lopressor - add PRN IV therapy for SBP > 180  DM - diabetic diet, check A1c, home Lantus 10 units at bedtime, and SSI  Chronic pain syndrome - will hold methadone until we have confirmation from  pharmacy, in the mean time continue Neurontin, Norco, Pamelor  Depression - continue home Zoloft  HLD - continue home Zocor  DVT prophylaxis: Lovenox   Code Status: FULL   Family Communication: None present    Disposition Plan: Home at [place] called: None   Admission status: Observatation   Time spent: 39 minutes  Nyxon Strupp Marry Guan MD [facility] Pager [phone]  If 7PM-7AM, please contact night-coverage [email or web address] Password [facility]  03/30/2019, 4:33 PM

## 2019-03-30 NOTE — Progress Notes (Signed)
At 1750 patient arrived to floor from ER. MRI techs arrived to room to take patient to MRI. Patient left via bed. Phone call received from MRI that patient stated he needed to be sedated last time he had an MRI. Called on call MD to obtain directions. Patient returned to floor, given Ativan 1mg  IV as ordered and patient left for MRI again.

## 2019-03-31 ENCOUNTER — Observation Stay (HOSPITAL_COMMUNITY): Payer: Medicare Other

## 2019-03-31 DIAGNOSIS — N179 Acute kidney failure, unspecified: Secondary | ICD-10-CM

## 2019-03-31 DIAGNOSIS — R4182 Altered mental status, unspecified: Secondary | ICD-10-CM | POA: Diagnosis not present

## 2019-03-31 DIAGNOSIS — G9340 Encephalopathy, unspecified: Secondary | ICD-10-CM | POA: Diagnosis not present

## 2019-03-31 LAB — COMPREHENSIVE METABOLIC PANEL
ALT: 14 U/L (ref 0–44)
AST: 33 U/L (ref 15–41)
Albumin: 3.3 g/dL — ABNORMAL LOW (ref 3.5–5.0)
Alkaline Phosphatase: 52 U/L (ref 38–126)
Anion gap: 9 (ref 5–15)
BUN: 29 mg/dL — ABNORMAL HIGH (ref 8–23)
CO2: 29 mmol/L (ref 22–32)
Calcium: 8.3 mg/dL — ABNORMAL LOW (ref 8.9–10.3)
Chloride: 101 mmol/L (ref 98–111)
Creatinine, Ser: 1.68 mg/dL — ABNORMAL HIGH (ref 0.61–1.24)
GFR calc Af Amer: 49 mL/min — ABNORMAL LOW (ref >60)
GFR calc non Af Amer: 42 mL/min — ABNORMAL LOW (ref >60)
Glucose, Bld: 79 mg/dL (ref 70–99)
Potassium: 3.9 mmol/L (ref 3.5–5.1)
Sodium: 139 mmol/L (ref 135–145)
Total Bilirubin: 0.8 mg/dL (ref 0.3–1.2)
Total Protein: 6.1 g/dL — ABNORMAL LOW (ref 6.5–8.1)

## 2019-03-31 LAB — GLUCOSE, CAPILLARY
Glucose-Capillary: 67 mg/dL — ABNORMAL LOW (ref 70–99)
Glucose-Capillary: 74 mg/dL (ref 70–99)
Glucose-Capillary: 106 mg/dL — ABNORMAL HIGH (ref 70–99)
Glucose-Capillary: 56 mg/dL — ABNORMAL LOW (ref 70–99)
Glucose-Capillary: 74 mg/dL (ref 70–99)
Glucose-Capillary: 90 mg/dL (ref 70–99)

## 2019-03-31 LAB — IRON AND TIBC
Iron: 18 ug/dL — ABNORMAL LOW (ref 45–182)
Saturation Ratios: 7 % — ABNORMAL LOW (ref 17.9–39.5)
TIBC: 248 ug/dL — ABNORMAL LOW (ref 250–450)
UIBC: 230 ug/dL

## 2019-03-31 LAB — FOLATE: Folate: 12.3 ng/mL (ref >5.9)

## 2019-03-31 LAB — VITAMIN B12: Vitamin B-12: 219 pg/mL (ref 180–914)

## 2019-03-31 LAB — FERRITIN: Ferritin: 96 ng/mL (ref 24–336)

## 2019-03-31 LAB — URINE CULTURE: Culture: NO GROWTH

## 2019-03-31 LAB — RETICULOCYTES
Immature Retic Fract: 11.3 % (ref 2.3–15.9)
RBC.: 3.56 MIL/uL — ABNORMAL LOW (ref 4.22–5.81)
Retic Count, Absolute: 66.6 10*3/uL (ref 19.0–186.0)
Retic Ct Pct: 1.9 % (ref 0.4–3.1)

## 2019-03-31 LAB — CBC
HCT: 29.8 % — ABNORMAL LOW (ref 39.0–52.0)
Hemoglobin: 9.6 g/dL — ABNORMAL LOW (ref 13.0–17.0)
MCH: 28.7 pg (ref 26.0–34.0)
MCHC: 32.2 g/dL (ref 30.0–36.0)
MCV: 89 fL (ref 80.0–100.0)
Platelets: 105 10*3/uL — ABNORMAL LOW (ref 150–400)
RBC: 3.35 MIL/uL — ABNORMAL LOW (ref 4.22–5.81)
RDW: 14.2 % (ref 11.5–15.5)
WBC: 2.8 10*3/uL — ABNORMAL LOW (ref 4.0–10.5)
nRBC: 0 % (ref 0.0–0.2)

## 2019-03-31 LAB — HEMOGLOBIN A1C
Hgb A1c MFr Bld: 7.2 % — ABNORMAL HIGH (ref 4.8–5.6)
Mean Plasma Glucose: 159.94 mg/dL

## 2019-03-31 LAB — SODIUM, URINE, RANDOM: Sodium, Ur: 100 mmol/L

## 2019-03-31 LAB — TSH: TSH: 1.629 u[IU]/mL (ref 0.350–4.500)

## 2019-03-31 LAB — PROCALCITONIN: Procalcitonin: 0.23 ng/mL

## 2019-03-31 LAB — CREATININE, URINE, RANDOM: Creatinine, Urine: 99.58 mg/dL

## 2019-03-31 LAB — AMMONIA: Ammonia: 16 umol/L (ref 9–35)

## 2019-03-31 LAB — D-DIMER, QUANTITATIVE: D-Dimer, Quant: 1.58 ug/mL-FEU — ABNORMAL HIGH (ref 0.00–0.50)

## 2019-03-31 MED ORDER — SODIUM CHLORIDE 0.9 % IV SOLN
1000.0000 mL | INTRAVENOUS | Status: DC
  Administered 2019-03-31: 1000 mL via INTRAVENOUS

## 2019-03-31 NOTE — Progress Notes (Signed)
Inpatient Diabetes Program Recommendations  AACE/ADA: New Consensus Statement on Inpatient Glycemic Control (2015)  Target Ranges:  Prepandial:   less than 140 mg/dL      Peak postprandial:   less than 180 mg/dL (1-2 hours)      Critically ill patients:  140 - 180 mg/dL   Lab Results  Component Value Date   GLUCAP 74 03/31/2019   HGBA1C 7.2 (H) 03/31/2019    Review of Glycemic Control  Diabetes history: DM2 Outpatient Diabetes medications: Basaglar 16 units QD, metformin 500 mg bid, glipizide 5 mg QAM, 2.5 mg before supper Current orders for Inpatient glycemic control:  Lantus 16 units QHS, Novolog 0-15 units tidwc and 0-5 units QHS + 4 units tidwc  HgbA1C 7.2%  Inpatient Diabetes Program Recommendations:     Agree with orders. Will follow throughout stay.  Thank you. Lorenda Peck, RD, LDN, CDE Inpatient Diabetes Coordinator [phone]

## 2019-03-31 NOTE — Progress Notes (Signed)
Patient was asking for cochlear button today, I finally tracked it down after many phone calls and inquiries. When wife, [name] arrived I did tell her that I was thankful we found it, but that to think about taking it home as it is very expensive and if it got lost or stolen [facility] would not be liable for replacing it. [name] said "I would take it home, but he can't function without it." I labeled a pink denture cup "cochelar button" and told patient to put in there if it is not on his head or on the charger.

## 2019-03-31 NOTE — Evaluation (Addendum)
Clinical/Bedside Swallow Evaluation Patient Details  Name: [name] MRN: [id number] Date of Birth: 1956-01-16  Today's Date: 03/31/2019 Time: SLP Start Time (ACUTE ONLY): [id number] SLP Stop Time (ACUTE ONLY): 1750 SLP Time Calculation (min) (ACUTE ONLY): 34 min  Past Medical History:  Past Medical History:  Diagnosis Date  . Anxiety   . Arthritis   . Chronic narcotic use   . Complication of anesthesia    h/o aspiration during back surgery  . Depression   . Diabetic peripheral neuropathy ([place])   . Hearing loss    wears bilateral hearing aids, bilat moderate SNHL  . Hyperlipidemia   . Hypertension   . Insomnia   . Tobacco abuse   . Type 1 diabetes mellitus ([place])   . Ulcer of left ankle ([place])   . Vision abnormalities    mild DM retinopathy   Past Surgical History:  Past Surgical History:  Procedure Laterality Date  . COCHLEAR IMPLANT Left 10/31/2017   Procedure: COCHLEAR IMPLANT LEFT EAR;  Surgeon: Vicie Mutters, MD;  Location: [facility];  Service: ENT;  Laterality: Left;  . COLONOSCOPY WITH PROPOFOL  07-26-2010  . [place] had 16 tons of steel rolled over his feet  . LUMBAR LAMINECTOMY/DECOMPRESSION MICRODISCECTOMY  12-23-1997   LEFT  L5 -- S1  . TONSILLECTOMY     HPI:  64 yo male adm to [facility] with fever, AMS.  Pt PMH + for HLD, HTN, hearing loss s/p CI on left ear, chronic narcotic use, tobacco use - stopped March 2020 - but started again November 2020. Pt admits to taking a medication at home (prezagen for memory) at 1730 and he will belch and taste it at 2230.  Admits to early satiety. Pt is not taking a reflux medication at this time and denies reflux symptoms.  Swallow evaluation ordered.   Assessment / Plan / Recommendation Clinical Impression  Patient presents with functional oropharyngeal swallow ability without clinical indications of aspiration or dysphagia.  Pt passed 3 ounce water test without deficits.  Adequate mastication apparent  with oral clearance.  No focal CN deficit apparent.  Pt denies reflux but admits to taking a medication at home (Prezagen for memory) at 1730 and he will belch and taste it at 2230. - ? if he may have some delayed gastric emptying and/or reflux.   Recommend continue diet as tolerated. No SLP follow up indicated.  Pt educated and agreeable to plan.    Of note, pt states his CI area on left *bone* itches and is painful at times - advised he speak to MD about this issue. SLP Visit Diagnosis: Dysphagia, unspecified (R13.10)    Aspiration Risk  No limitations    Diet Recommendation Regular;Thin liquid   Liquid Administration via: Cup;Straw Medication Administration: Whole meds with liquid Supervision: Patient able to self feed Compensations: Slow rate;Small sips/bites Postural Changes: Seated upright at 90 degrees    Other  Recommendations Oral Care Recommendations: Oral care BID   Follow up Recommendations None      Frequency and Duration      n/a      Prognosis   good     Swallow Study   General Date of Onset: 03/31/19 HPI: 64 yo male adm to [facility] with fever, AMS.  Pt PMH + for HLD, HTN, hearing loss s/p CI on left ear, chronic narcotic use, tobacco use - stopped March 2020 - but started again November 2020. Pt admits to taking  a medication at home (prezagen for memory) at 1730 and he will belch and taste it at 2230.  Admits to early satiety. Pt is not taking a reflux medication at this time and denies reflux symptoms.  Swallow evaluation ordered. Type of Study: Bedside Swallow Evaluation Diet Prior to this Study: Regular;Thin liquids Temperature Spikes Noted: No Respiratory Status: Room air History of Recent Intubation: No Behavior/Cognition: Alert;Cooperative;Pleasant mood Oral Cavity Assessment: Within Functional Limits Oral Care Completed by SLP: No Oral Cavity - Dentition: Adequate natural dentition Vision: Functional for self-feeding Self-Feeding Abilities: Able to feed  self Patient Positioning: Upright in bed Baseline Vocal Quality: Normal Volitional Cough: Strong Volitional Swallow: Able to elicit    Oral/Motor/Sensory Function Overall Oral Motor/Sensory Function: Within functional limits   Ice Chips Ice chips: Not tested   Thin Liquid Thin Liquid: Within functional limits Presentation: Cup;Straw    Nectar Thick Nectar Thick Liquid: Not tested   Honey Thick Honey Thick Liquid: Not tested   Puree Puree: Not tested   Solid     Solid: Within functional limits Presentation: Self Fed;Spoon      Macario Golds 03/31/2019,6:39 PM  Kathleen Lime, MS [place] Office [phone]

## 2019-03-31 NOTE — Progress Notes (Addendum)
PROGRESS NOTE    [name]  QZR:[id number] DOB: 04/29/1955 DOA: 03/30/2019 PCP: Leanna Battles, MD    Brief Narrative:  64 year old male with prior h/o DM, hypertension, hyperlipidemia, admitted for altered mental status. As per the patient's wife he has been having memory issues and cognitive deficits since one year. Pt is currently alert and oriented person, and place. On arrival to ED, he was found to have a creatinine of 1.69, AKI, he was admitted for further evaluation.   Assessment & Plan:   Active Problems:   AMS (altered mental status)   Acute metabolic encephalopathy:  Probably from AKI and medications.  Pt is alert and oriented and appears to be back to baseline.  Further work up for dementia ordered.  MRI couldn't be done as pt has metal in his body.   AKI: pts baseline creatinine is around 1.2 and on admission is 1.6.  Gently hydrate and repeat renal parameters in am.     Hypertension:  Well controlled.  Resume losartan and metoprolol.    Hyperlipidemia:  Resume zocor.   Diabetes Mellitus:  CBG (last 3)  Recent Labs    03/31/19 1217 03/31/19 1731 03/31/19 1816  GLUCAP 106* 56* 74   Resume SSI. Stop the lantus and pre meal coverage.       DVT prophylaxis: scd's Code Status: (full code.  Family Communication: none at bedside.  Disposition Plan:  . Patient came from: [facility]            . Anticipated d/c place: therapy eval pending. . Barriers to d/c OR conditions which need to be met to effect a safe d/c: AKI. Further work up for hypoglycemia and dementia is pending.    Consultants:   None.    Procedures:   MRI brain without contrast.   Antimicrobials: None.   Subjective: Pt unable to lay flat for the MRI, attempted twice.  Pt alert and denies any new complaints.   Objective: Vitals:   03/30/19 2357 03/31/19 0433 03/31/19 0926 03/31/19 1020  BP: (!) 172/101 (!) 135/45 (!) 134/56 (!) 132/36  Pulse: 79 (!) 55 65 (!) 56  Resp:  '18 18  18  '$ Temp: 99.1 F (37.3 C) 98.8 F (37.1 C)  98 F (36.7 C)  TempSrc: Oral Oral  Oral  SpO2: 91% 94% (!) 89% 94%    Intake/Output Summary (Last 24 hours) at 03/31/2019 1021 Last data filed at 03/31/2019 0300 Gross per 24 hour  Intake 1443.31 ml  Output --  Net 1443.31 ml   There were no vitals filed for this visit.  Examination:  General exam: Appears calm and comfortable  Respiratory system: Clear to auscultation. Respiratory effort normal. Cardiovascular system: S1 & S2 heard, RRR. No JVD . No pedal edema. Gastrointestinal system: Abdomen is nondistended, soft and nontender . Normal bowel sounds heard. Central nervous system: Alert and able to answer simple questions.  Extremities: Symmetric 5 x 5 power. Skin: No rashes, lesions or ulcers Psychiatry: mood appropriate.     Data Reviewed: I have personally reviewed following labs and imaging studies  CBC: Recent Labs  Lab 03/30/19 1231 03/31/19 0415  WBC 4.5 2.8*  NEUTROABS 3.6  --   HGB 11.4* 9.6*  HCT 34.8* 29.8*  MCV 88.3 89.0  PLT 131* 354*   Basic Metabolic Panel: Recent Labs  Lab 03/30/19 1231 03/31/19 0415  NA 137 139  K 4.0 3.9  CL 96* 101  CO2 32 29  GLUCOSE 119* 79  BUN 33* 29*  CREATININE 1.69* 1.68*  CALCIUM 8.5* 8.3*   GFR: CrCl cannot be calculated (Unknown ideal weight.). Liver Function Tests: Recent Labs  Lab 03/30/19 1231 03/31/19 0415  AST 18 33  ALT 11 14  ALKPHOS 70 52  BILITOT 0.8 0.8  PROT 6.7 6.1*  ALBUMIN 3.7 3.3*   No results for input(s): LIPASE, AMYLASE in the last 168 hours. No results for input(s): AMMONIA in the last 168 hours. Coagulation Profile: Recent Labs  Lab 03/30/19 1231  INR 1.1   Cardiac Enzymes: No results for input(s): CKTOTAL, CKMB, CKMBINDEX, TROPONINI in the last 168 hours. BNP (last 3 results) No results for input(s): PROBNP in the last 8760 hours. HbA1C: Recent Labs    03/31/19 0415  HGBA1C 7.2*   CBG: Recent Labs  Lab  03/30/19 2132 03/31/19 0800 03/31/19 0829  GLUCAP 81 67* 74   Lipid Profile: No results for input(s): CHOL, HDL, LDLCALC, TRIG, CHOLHDL, LDLDIRECT in the last 72 hours. Thyroid Function Tests: No results for input(s): TSH, T4TOTAL, FREET4, T3FREE, THYROIDAB in the last 72 hours. Anemia Panel: No results for input(s): VITAMINB12, FOLATE, FERRITIN, TIBC, IRON, RETICCTPCT in the last 72 hours. Sepsis Labs: Recent Labs  Lab 03/30/19 1231  LATICACIDVEN 1.5    Recent Results (from the past 240 hour(s))  Blood Culture (routine x 2)     Status: None (Preliminary result)   Collection Time: 03/30/19 12:31 PM   Specimen: BLOOD  Result Value Ref Range Status   Specimen Description BLOOD BLOOD RIGHT FOREARM  Final   Special Requests   Final    BOTTLES DRAWN AEROBIC AND ANAEROBIC Blood Culture adequate volume Performed at [facility] [address] [place], [place] [address]    Culture NO GROWTH < 24 HOURS  Final   Report Status PENDING  Incomplete  Blood Culture (routine x 2)     Status: None (Preliminary result)   Collection Time: 03/30/19 12:36 PM   Specimen: BLOOD  Result Value Ref Range Status   Specimen Description BLOOD RIGHT ANTECUBITAL  Final   Special Requests   Final    BOTTLES DRAWN AEROBIC AND ANAEROBIC Blood Culture adequate volume Performed at [facility] [address], [place], [place] [address]    Culture NO GROWTH < 24 HOURS  Final   Report Status PENDING  Incomplete  SARS CORONAVIRUS 2 (TAT 6-24 HRS) Nasopharyngeal Nasopharyngeal Swab     Status: None   Collection Time: 03/30/19  3:57 PM   Specimen: Nasopharyngeal Swab  Result Value Ref Range Status   SARS Coronavirus 2 NEGATIVE NEGATIVE Final    Comment: (NOTE) SARS-CoV-2 target nucleic acids are NOT DETECTED. The SARS-CoV-2 RNA is generally detectable in upper and lower respiratory specimens during the acute phase of infection. Negative results do not preclude  SARS-CoV-2 infection, do not rule out co-infections with other pathogens, and should not be used as the sole basis for treatment or other patient management decisions. Negative results must be combined with clinical observations, patient history, and epidemiological information. The expected result is Negative. Fact Sheet for Patients: SugarRoll.be Fact Sheet for Healthcare Providers: https://www.woods-mathews.com/ This test is not yet approved or cleared by the [place] FDA and  has been authorized for detection and/or diagnosis of SARS-CoV-2 by FDA under an Emergency Use Authorization (EUA). This EUA will remain  in effect (meaning this test can be used) for the duration of the COVID-19 declaration under Section 56 4(b)(1) of the Act, 21  U.S.C. section 360bbb-3(b)(1), unless the authorization is terminated or revoked sooner. Performed at [facility], [place] [address], [place], [place] [address]          Radiology Studies: CT Head Wo Contrast  Result Date: 03/30/2019 CLINICAL DATA:  Altered mental status. Fever. Confusion. Second COVID vaccination yesterday. No reported injury. EXAM: CT HEAD WITHOUT CONTRAST TECHNIQUE: Contiguous axial images were obtained from the base of the skull through the vertex without intravenous contrast. COMPARISON:  09/20/2018 head CT. FINDINGS: Motion degraded scan, limiting assessment. Left cerebrum obscured by streak artifact from left cochlear implant. Brain: No evidence of parenchymal hemorrhage or extra-axial fluid collection. No mass lesion, mass effect, or midline shift. No CT evidence of acute infarction. Nonspecific mild subcortical and periventricular Bussa matter hypodensity, most in keeping with chronic small vessel ischemic change. Cerebral volume is age appropriate. No ventriculomegaly. Vascular: No acute abnormality. Skull: No evidence of calvarial fracture. Stable position of left cochlear  implant device along the outer table of the lateral left calvarium. Sinuses/Orbits: The visualized paranasal sinuses are essentially clear. Other: Small partial left mastoid effusion is unchanged. Clear right mastoid air cells. IMPRESSION: 1. Limited motion degraded scan. 2. No evidence of acute intracranial abnormality. No evidence of calvarial fracture. 3. Mild chronic small vessel ischemic changes in the cerebral Panagopoulos matter. 4. Chronic nonspecific small partial left mastoid effusion. Stable left cochlear implant. Electronically Signed   By: Ilona Sorrel M.D.   On: 03/30/2019 13:37   DG Chest Port 1 View  Result Date: 03/30/2019 CLINICAL DATA:  Fever, lethargy EXAM: PORTABLE CHEST 1 VIEW COMPARISON:  07/05/2018 chest radiograph. FINDINGS: Stable cardiomediastinal silhouette with normal heart size. No pneumothorax. No pleural effusion. Lungs appear clear, with no acute consolidative airspace disease and no pulmonary edema. IMPRESSION: No active disease. Electronically Signed   By: Ilona Sorrel M.D.   On: 03/30/2019 13:29        Scheduled Meds: . amLODipine  5 mg Oral Daily  . aspirin EC  81 mg Oral Daily  . divalproex  250 mg Oral QHS  . enoxaparin (LOVENOX) injection  40 mg Subcutaneous Q24H  . gabapentin  300 mg Oral TID  . insulin aspart  0-15 Units Subcutaneous TID WC  . insulin aspart  0-5 Units Subcutaneous QHS  . insulin aspart  4 Units Subcutaneous TID WC  . insulin glargine  16 Units Subcutaneous QHS  . losartan  100 mg Oral Daily  . methadone  20 mg Oral Q12H  . metoprolol tartrate  25 mg Oral QHS  . metoprolol tartrate  50 mg Oral BID  . sertraline  50 mg Oral QHS  . simvastatin  20 mg Oral QHS   Continuous Infusions: . sodium chloride 125 mL/hr at 03/31/19 0300     LOS: 0 days     Hosie Poisson, MD [facility]   To contact the attending provider between 7A-7P or the covering provider during after hours 7P-7A, please log into the web site [email or web address] and  access using universal  password for that web site. If you do not have the password, please call the hospital operator.  03/31/2019, 10:21 AM

## 2019-03-31 NOTE — Plan of Care (Signed)

## 2019-04-01 ENCOUNTER — Inpatient Hospital Stay (HOSPITAL_COMMUNITY): Payer: Medicare Other

## 2019-04-01 DIAGNOSIS — G9340 Encephalopathy, unspecified: Secondary | ICD-10-CM | POA: Diagnosis not present

## 2019-04-01 DIAGNOSIS — R0602 Shortness of breath: Secondary | ICD-10-CM | POA: Diagnosis not present

## 2019-04-01 DIAGNOSIS — R4182 Altered mental status, unspecified: Secondary | ICD-10-CM | POA: Diagnosis not present

## 2019-04-01 LAB — CBC WITH DIFFERENTIAL/PLATELET
Abs Immature Granulocytes: 0.02 10*3/uL (ref 0.00–0.07)
Basophils Absolute: 0 10*3/uL (ref 0.0–0.1)
Basophils Relative: 1 %
Eosinophils Absolute: 0.2 10*3/uL (ref 0.0–0.5)
Eosinophils Relative: 5 %
HCT: 34.5 % — ABNORMAL LOW (ref 39.0–52.0)
Hemoglobin: 10.7 g/dL — ABNORMAL LOW (ref 13.0–17.0)
Immature Granulocytes: 1 %
Lymphocytes Relative: 28 %
Lymphs Abs: 0.9 10*3/uL (ref 0.7–4.0)
MCH: 28.7 pg (ref 26.0–34.0)
MCHC: 31 g/dL (ref 30.0–36.0)
MCV: 92.5 fL (ref 80.0–100.0)
Monocytes Absolute: 0.5 10*3/uL (ref 0.1–1.0)
Monocytes Relative: 14 %
Neutro Abs: 1.7 10*3/uL (ref 1.7–7.7)
Neutrophils Relative %: 51 %
Platelets: 133 10*3/uL — ABNORMAL LOW (ref 150–400)
RBC: 3.73 MIL/uL — ABNORMAL LOW (ref 4.22–5.81)
RDW: 14.4 % (ref 11.5–15.5)
WBC: 3.3 10*3/uL — ABNORMAL LOW (ref 4.0–10.5)
nRBC: 0 % (ref 0.0–0.2)

## 2019-04-01 LAB — BASIC METABOLIC PANEL
Anion gap: 8 (ref 5–15)
BUN: 32 mg/dL — ABNORMAL HIGH (ref 8–23)
CO2: 27 mmol/L (ref 22–32)
Calcium: 8.2 mg/dL — ABNORMAL LOW (ref 8.9–10.3)
Chloride: 103 mmol/L (ref 98–111)
Creatinine, Ser: 1.75 mg/dL — ABNORMAL HIGH (ref 0.61–1.24)
GFR calc Af Amer: 46 mL/min — ABNORMAL LOW (ref >60)
GFR calc non Af Amer: 40 mL/min — ABNORMAL LOW (ref >60)
Glucose, Bld: 106 mg/dL — ABNORMAL HIGH (ref 70–99)
Potassium: 4 mmol/L (ref 3.5–5.1)
Sodium: 138 mmol/L (ref 135–145)

## 2019-04-01 LAB — GLUCOSE, CAPILLARY
Glucose-Capillary: 92 mg/dL (ref 70–99)
Glucose-Capillary: 133 mg/dL — ABNORMAL HIGH (ref 70–99)
Glucose-Capillary: 134 mg/dL — ABNORMAL HIGH (ref 70–99)

## 2019-04-01 MED ORDER — FERROUS SULFATE 325 (65 FE) MG PO TABS
325.0000 mg | ORAL_TABLET | Freq: Two times a day (BID) | ORAL | Status: DC
  Administered 2019-04-01: 325 mg via ORAL
  Filled 2019-04-01: qty 1

## 2019-04-01 MED ORDER — ADULT MULTIVITAMIN W/MINERALS CH: 1.0000 | ORAL_TABLET | Freq: Every day | ORAL | Status: DC

## 2019-04-01 MED ORDER — FERROUS SULFATE 325 (65 FE) MG PO TABS: 325.0000 mg | ORAL_TABLET | Freq: Two times a day (BID) | ORAL | 3 refills | Status: DC

## 2019-04-01 MED ORDER — PRO-STAT SUGAR FREE PO LIQD
30.0000 mL | Freq: Two times a day (BID) | ORAL | Status: DC
  Administered 2019-04-01: 30 mL via ORAL
  Filled 2019-04-01: qty 30

## 2019-04-01 MED ORDER — TECHNETIUM TO 99M ALBUMIN AGGREGATED
1.5300 | Freq: Once | INTRAVENOUS | Status: AC | PRN
  Administered 2019-04-01: 1.53 via INTRAVENOUS

## 2019-04-01 MED ORDER — PRO-STAT SUGAR FREE PO LIQD: 30.0000 mL | Freq: Two times a day (BID) | ORAL | 0 refills | Status: DC

## 2019-04-01 MED ORDER — ADULT MULTIVITAMIN W/MINERALS CH
1.0000 | ORAL_TABLET | Freq: Every day | ORAL | Status: DC
  Administered 2019-04-01: 1 via ORAL
  Filled 2019-04-01: qty 1

## 2019-04-01 NOTE — Progress Notes (Signed)
Patient discharge home at this time via private car in no acute episode.

## 2019-04-01 NOTE — Progress Notes (Addendum)
Initial Nutrition Assessment  DOCUMENTATION CODES:   Not applicable  INTERVENTION:  - will order 30 ml prostat BID, each supplement provides 100 kcal and 15 grams protein.  - will order daily multivitamin with minerals. * weigh patient today.    NUTRITION DIAGNOSIS:   Increased nutrient needs related to acute illness as evidenced by estimated needs.  GOAL:   Patient will meet greater than or equal to 90% of their needs  MONITOR:   PO intake, Supplement acceptance, Labs, Weight trends  REASON FOR ASSESSMENT:   Consult Assessment of nutrition requirement/status  ASSESSMENT:   64 year old male with medical history of DM, HTN, and hyperlipidemia. He was admitted due to AMS. His wife reported he had been having memory issues and cognitive deficits for the past 1 year.  He consumed 100% of breakfast this AM (600 kcal, 27 grams protein). Patient reports good appetite now and PTA. He denies any recent weight changes. Per chart review, patient has not been weighed since 09/25/18 at which time he weighed 177 lb.   Patient denied any chewing or swallowing issues, but he was assessed by SLP yesterday evening. SLP recommended regular, thin liquid diet.   Per notes: - acute metabolic encephalopathy--now a/o x3 - AKI  Labs reviewed; CBGs: 92 and 133 mg/dl, BUN: 32 mg/dl, creatinine: 1.75 mg;dl, Ca: 8.2 mg/dl, GFR: 40 ml/min.  Medications reviewed; sliding scale novolog.     NUTRITION - FOCUSED PHYSICAL EXAM:  completed; no muscle or fat wasting.   Diet Order:   Diet Order            Diet Carb Modified Fluid consistency: Thin; Room service appropriate? Yes  Diet effective now              EDUCATION NEEDS:   No education needs have been identified at this time  Skin:  Skin Assessment: Reviewed RN Assessment  Last BM:  3/13  Height:   Ht Readings from Last 1 Encounters:  09/25/18 5\' 10"  (1.778 m)    Weight:   Wt Readings from Last 1 Encounters:  09/25/18 80.7  kg    Ideal Body Weight:  75.4 kg  BMI:  There is no height or weight on file to calculate BMI.  Estimated Nutritional Needs:   Kcal:  1900-2100 kcal  Protein:  90-100 grams  Fluid:  >/= 1.9 L/day     Jarome Matin, MS, RD, LDN, CNSC Inpatient Clinical Dietitian RD pager # available in [facility]  After hours/weekend pager # available in [facility]

## 2019-04-01 NOTE — Discharge Instructions (Signed)

## 2019-04-01 NOTE — Discharge Summary (Signed)
Physician Discharge Summary  [name] [id number] DOB: 1955-04-30 DOA: 03/30/2019  PCP: Leanna Battles, MD  Admit date: 03/30/2019 Discharge date: 04/01/2019  Admitted From: Home.  Disposition:  Home.   Recommendations for Outpatient Follow-up:  1. Follow up with PCP in 1-2 weeks 2. Please obtain BMP/CBC in one week   Discharge Condition:stable.  CODE STATUS:full code.  Diet recommendation: Heart Healthy   Brief/Interim Summary: 64 year old male with prior h/o DM, hypertension, hyperlipidemia, admitted for altered mental status. As per the patient's wife he has been having memory issues and cognitive deficits since one year and chronic respiratory failure with hypoxia requiring upto 3lit of [place] oxygen.  On arrival to ED, he was found to have a creatinine of 1.69, AKI, he was admitted for further evaluation of acute encephalopathy, AKI and evaluation of hypoxia.   Discharge Diagnoses:  Active Problems:   AMS (altered mental status)   Acute encephalopathy  Acute metabolic encephalopathy:  Probably from AKI is from hypoglycemia versus medications. Pt is alert and oriented and appears to be back to baseline.  Ammonia level is 16, TSH is within normal limits.  Vitamin B12 levels are 219. CT of the head without contrast on admission shows No evidence of acute intracranial abnormality. No evidence of calvarial fracture. MRI couldn't be done, and patient's wife would like to get it done as an outpatient in an open MRI setting.  AKI: pts baseline creatinine is around 1.2 and on admission is 1.6. slight worsening to 1.75.  Urine sodium is 100, urine creatinine is 99, ultrasound renal does not show any hydronephrosis.  Urine analysis shows urine protein of 100.  Urine analysis is negative for infection.  Suspect patient's new baseline appears to be around 1.6.    Hypertension:  Well-controlled blood pressure parameters, continue with metoprolol and  losartan.Marland Kitchen   Hyperlipidemia:  Resume zocor.   Diabetes Mellitus: Suboptimally controlled with hypoglycemia in the last 24 hours.  Patient's Lantus and glipizide were discontinued and CBGs have improved patient's hemoglobin A1c is 7.2.  Please follow up with PCP regarding resuming home diabetic meds.    Chronic respiratory failure with hypoxia requiring up to 3 L of nasal cannula oxygen of unclear etiology As per the patient's wife patient is always sitting and sleeping in his recliner and does not ambulate much over the last 6 months.  Patient was tested negative for Covid 19. But his D-dimer is elevated at 1.58, will get VQ scan to rule out PE.   Iron deficiency anemia Patient's baseline hemoglobin ranges between 9-11.  Hemoglobin is at 9.6 , anemia panel show low iron levels and ferritin of 96 with adequate folate and vitamin B12 levels. Iron supplementation added to his regimen.     Mild thrombocytopenia Unclear etiology, no signs of active bleeding.  Continue to monitor    Discharge Instructions  Discharge Instructions    Diet - low sodium heart healthy   Complete by: As directed    Discharge instructions   Complete by: As directed    Follow up with PCP in one week.     Allergies as of 04/01/2019      Reactions   Codeine Nausea And Vomiting   Tetracyclines & Related Hives      Medication List    STOP taking these medications   Basaglar KwikPen 100 UNIT/ML   furosemide 20 MG tablet Commonly known as: LASIX   glipiZIDE 2.5 MG 24 hr tablet Commonly known as: GLUCOTROL XL  hydrochlorothiazide 25 MG tablet Commonly known as: HYDRODIURIL   metFORMIN 500 MG 24 hr tablet Commonly known as: GLUCOPHAGE-XR   Trelegy Ellipta 100-62.5-25 MCG/INH Aepb Generic drug: Fluticasone-Umeclidin-Vilant     TAKE these medications   amLODipine 5 MG tablet Commonly known as: NORVASC Take 5 mg by mouth daily.   Anoro Ellipta 62.5-25 MCG/INH Aepb Generic drug:  umeclidinium-vilanterol Inhale 1 puff into the lungs in the morning and at bedtime.   aspirin EC 81 MG tablet Take 81 mg by mouth daily.   divalproex 250 MG DR tablet Commonly known as: DEPAKOTE Take 250 mg by mouth daily.   doxepin 25 MG capsule Commonly known as: SINEQUAN Take 25 mg by mouth at bedtime.   feeding supplement (PRO-STAT SUGAR FREE 64) Liqd Take 30 mLs by mouth 2 (two) times daily.   ferrous sulfate 325 (65 FE) MG tablet Take 1 tablet (325 mg total) by mouth 2 (two) times daily with a meal.   gabapentin 300 MG capsule Commonly known as: NEURONTIN Take 300 mg by mouth 2 (two) times daily.   losartan 100 MG tablet Commonly known as: COZAAR Take 100 mg by mouth daily.   methadone 10 MG tablet Commonly known as: DOLOPHINE Take 20 mg by mouth every 12 (twelve) hours.   metoprolol tartrate 25 MG tablet Commonly known as: LOPRESSOR Take 25-50 mg by mouth See admin instructions. Take 2 tablets (50 mg) by mouth every morning and 1 tablet (25 mg) at bedtime   multivitamin with minerals Tabs tablet Take 1 tablet by mouth daily. Start taking on: April 02, 2019   OXYGEN Inhale into the lungs. 3 liters   sertraline 50 MG tablet Commonly known as: ZOLOFT Take 50 mg by mouth at bedtime.   simvastatin 20 MG tablet Commonly known as: ZOCOR Take 20 mg by mouth at bedtime.      Follow-up Information    Leanna Battles, MD. Schedule an appointment as soon as possible for a visit in 1 week(s).   Specialty: Internal Medicine Contact information: [place] [address] [phone]          Allergies  Allergen Reactions  . Codeine Nausea And Vomiting  . Tetracyclines & Related Hives    Consultations:  None.    Procedures/Studies: CT Head Wo Contrast  Result Date: 03/30/2019 CLINICAL DATA:  Altered mental status. Fever. Confusion. Second COVID vaccination yesterday. No reported injury. EXAM: CT HEAD WITHOUT CONTRAST TECHNIQUE:  Contiguous axial images were obtained from the base of the skull through the vertex without intravenous contrast. COMPARISON:  09/20/2018 head CT. FINDINGS: Motion degraded scan, limiting assessment. Left cerebrum obscured by streak artifact from left cochlear implant. Brain: No evidence of parenchymal hemorrhage or extra-axial fluid collection. No mass lesion, mass effect, or midline shift. No CT evidence of acute infarction. Nonspecific mild subcortical and periventricular Vachon matter hypodensity, most in keeping with chronic small vessel ischemic change. Cerebral volume is age appropriate. No ventriculomegaly. Vascular: No acute abnormality. Skull: No evidence of calvarial fracture. Stable position of left cochlear implant device along the outer table of the lateral left calvarium. Sinuses/Orbits: The visualized paranasal sinuses are essentially clear. Other: Small partial left mastoid effusion is unchanged. Clear right mastoid air cells. IMPRESSION: 1. Limited motion degraded scan. 2. No evidence of acute intracranial abnormality. No evidence of calvarial fracture. 3. Mild chronic small vessel ischemic changes in the cerebral Genis matter. 4. Chronic nonspecific small partial left mastoid effusion. Stable left cochlear implant. Electronically Signed  By: Ilona Sorrel M.D.   On: 03/30/2019 13:37   NM Pulmonary Perfusion  Result Date: 04/01/2019 CLINICAL DATA:  Shortness of breath with exertion EXAM: NUCLEAR MEDICINE PERFUSION LUNG SCAN TECHNIQUE: Perfusion images were obtained in multiple projections after intravenous injection of radiopharmaceutical. Ventilation scans intentionally deferred if perfusion scan and chest x-ray adequate for interpretation during COVID 19 epidemic. Views: Anterior, posterior, RPO, LPO, RAO, LAO RADIOPHARMACEUTICALS:  1.53 mCi Tc-44m MAA IV COMPARISON:  Chest radiograph March 30, 2019 FINDINGS: Radiotracer uptake is homogeneous and symmetric bilaterally. No evident perfusion  defects. IMPRESSION: No appreciable perfusion defects. Very low probability of pulmonary embolus. Electronically Signed   By: Lowella Grip III M.D.   On: 04/01/2019 15:30   US RENAL  Result Date: 03/31/2019 CLINICAL DATA:  AKI EXAM: RENAL / URINARY TRACT ULTRASOUND COMPLETE COMPARISON:  None. FINDINGS: Right Kidney: Renal measurements: 10.5 x 4.5 x 4.4 cm = volume: 109 mL . Echogenicity within normal limits. No mass or hydronephrosis visualized. Left Kidney: Renal measurements: 10.5 x 4.9 x 5.5 cm = volume: 148 mL. Echogenicity within normal limits. No mass or hydronephrosis visualized. Bladder: Appears normal for degree of bladder distention. Other: None. IMPRESSION: Normal renal ultrasound. Electronically Signed   By: Kathreen Devoid   On: 03/31/2019 14:30   DG Chest Port 1 View  Result Date: 03/30/2019 CLINICAL DATA:  Fever, lethargy EXAM: PORTABLE CHEST 1 VIEW COMPARISON:  07/05/2018 chest radiograph. FINDINGS: Stable cardiomediastinal silhouette with normal heart size. No pneumothorax. No pleural effusion. Lungs appear clear, with no acute consolidative airspace disease and no pulmonary edema. IMPRESSION: No active disease. Electronically Signed   By: Ilona Sorrel M.D.   On: 03/30/2019 13:29       Subjective: No new complaints.   Discharge Exam: Vitals:   04/01/19 1208 04/01/19 1733  BP: (!) 153/47 (!) 154/50  Pulse: 62 (!) 54  Resp: 16 18  Temp: 98.1 F (36.7 C) 98.2 F (36.8 C)  SpO2: 93% (!) 89%   Vitals:   04/01/19 0935 04/01/19 1208 04/01/19 1323 04/01/19 1733  BP: (!) 141/46 (!) 153/47  (!) 154/50  Pulse: 65 62  (!) 54  Resp: 17 16  18   Temp: 99.1 F (37.3 C) 98.1 F (36.7 C)  98.2 F (36.8 C)  TempSrc: Oral Oral  Oral  SpO2: 94% 93%  (!) 89%  Weight:   74.4 kg     General: Pt is alert, awake, not in acute distress Cardiovascular: RRR, S1/S2 +, no rubs, no gallops Respiratory: CTA bilaterally, no wheezing, no rhonchi Abdominal: Soft, NT, ND, bowel sounds  + Extremities: no edema, no cyanosis    The results of significant diagnostics from this hospitalization (including imaging, microbiology, ancillary and laboratory) are listed below for reference.     Microbiology: Recent Results (from the past 240 hour(s))  Blood Culture (routine x 2)     Status: None (Preliminary result)   Collection Time: 03/30/19 12:31 PM   Specimen: BLOOD  Result Value Ref Range Status   Specimen Description   Final    BLOOD BLOOD RIGHT FOREARM Performed at [facility] [address] [place], [place] [address]    Special Requests   Final    BOTTLES DRAWN AEROBIC AND ANAEROBIC Blood Culture adequate volume Performed at [facility] [address], [place], [place] [address]    Culture   Final    NO GROWTH 2 DAYS Performed at [facility] [address]  [place], [place] [address]    Report Status PENDING  Incomplete  Blood Culture (routine x 2)     Status: None (Preliminary result)   Collection Time: 03/30/19 12:36 PM   Specimen: BLOOD  Result Value Ref Range Status   Specimen Description   Final    BLOOD RIGHT ANTECUBITAL Performed at [facility] [address], [place], [place] [address]    Special Requests   Final    BOTTLES DRAWN AEROBIC AND ANAEROBIC Blood Culture adequate volume Performed at [facility] [address] [place], [place] [address]    Culture   Final    NO GROWTH 2 DAYS Performed at [facility] [address], [place], [place] [address]    Report Status PENDING  Incomplete  Urine culture     Status: None   Collection Time: 03/30/19 12:44 PM   Specimen: In/Out Cath Urine  Result Value Ref Range Status   Specimen Description   Final    IN/OUT CATH URINE Performed at [facility] [address] [place], [place] [address]    Special Requests   Final    NONE Performed at [facility], [place]  [address] [place], [place] [address]    Culture   Final    NO GROWTH Performed at [facility], [place] [address] [place], [place] [address]    Report Status 03/31/2019 FINAL  Final  SARS CORONAVIRUS 2 (TAT 6-24 HRS) Nasopharyngeal Nasopharyngeal Swab     Status: None   Collection Time: 03/30/19  3:57 PM   Specimen: Nasopharyngeal Swab  Result Value Ref Range Status   SARS Coronavirus 2 NEGATIVE NEGATIVE Final    Comment: (NOTE) SARS-CoV-2 target nucleic acids are NOT DETECTED. The SARS-CoV-2 RNA is generally detectable in upper and lower respiratory specimens during the acute phase of infection. Negative results do not preclude SARS-CoV-2 infection, do not rule out co-infections with other pathogens, and should not be used as the sole basis for treatment or other patient management decisions. Negative results must be combined with clinical observations, patient history, and epidemiological information. The expected result is Negative. Fact Sheet for Patients: SugarRoll.be Fact Sheet for Healthcare Providers: https://www.woods-mathews.com/ This test is not yet approved or cleared by the [place] FDA and  has been authorized for detection and/or diagnosis of SARS-CoV-2 by FDA under an Emergency Use Authorization (EUA). This EUA will remain  in effect (meaning this test can be used) for the duration of the COVID-19 declaration under Section 56 4(b)(1) of the Act, 21 U.S.C. section 360bbb-3(b)(1), unless the authorization is terminated or revoked sooner. Performed at [facility], [place] [address] [place], [place] [address]      Labs: BNP (last 3 results) No results for input(s): BNP in the last 8760 hours. Basic Metabolic Panel: Recent Labs  Lab 03/30/19 1231 03/31/19 0415 04/01/19 0836  NA 137 139 138  K 4.0 3.9 4.0  CL 96* 101 103  CO2 32 29 27  GLUCOSE 119* 79 106*  BUN 33* 29* 32*  CREATININE 1.69* 1.68* 1.75*   CALCIUM 8.5* 8.3* 8.2*   Liver Function Tests: Recent Labs  Lab 03/30/19 1231 03/31/19 0415  AST 18 33  ALT 11 14  ALKPHOS 70 52  BILITOT 0.8 0.8  PROT 6.7 6.1*  ALBUMIN 3.7 3.3*   No results for input(s): LIPASE, AMYLASE in the last 168 hours. Recent Labs  Lab 03/31/19 1047  AMMONIA 16  CBC: Recent Labs  Lab 03/30/19 1231 03/31/19 0415 04/01/19 1450  WBC 4.5 2.8* 3.3*  NEUTROABS 3.6  --  1.7  HGB 11.4* 9.6* 10.7*  HCT 34.8* 29.8* 34.5*  MCV 88.3 89.0 92.5  PLT 131* 105* 133*   Cardiac Enzymes: No results for input(s): CKTOTAL, CKMB, CKMBINDEX, TROPONINI in the last 168 hours. BNP: Invalid input(s): POCBNP CBG: Recent Labs  Lab 03/31/19 1816 03/31/19 2207 04/01/19 0800 04/01/19 1203 04/01/19 1727  GLUCAP 74 90 92 133* 134*   D-Dimer Recent Labs    03/31/19 1429  DDIMER 1.58*   Hgb A1c Recent Labs    03/31/19 0415  HGBA1C 7.2*   Lipid Profile No results for input(s): CHOL, HDL, LDLCALC, TRIG, CHOLHDL, LDLDIRECT in the last 72 hours. Thyroid function studies Recent Labs    03/31/19 1047  TSH 1.629   Anemia work up Recent Labs    03/31/19 1047  VITAMINB12 219  FOLATE 12.3  FERRITIN 96  TIBC 248*  IRON 18*  RETICCTPCT 1.9   Urinalysis    Component Value Date/Time   COLORURINE YELLOW 03/30/2019 [place] 03/30/2019 1244   LABSPEC 1.010 03/30/2019 1244   PHURINE 7.0 03/30/2019 1244   GLUCOSEU NEGATIVE 03/30/2019 1244   HGBUR SMALL (A) 03/30/2019 1244   [place] 03/30/2019 1244   [place] 03/30/2019 1244   PROTEINUR 100 (A) 03/30/2019 1244   NITRITE NEGATIVE 03/30/2019 1244   LEUKOCYTESUR NEGATIVE 03/30/2019 1244   Sepsis Labs Invalid input(s): PROCALCITONIN,  WBC,  LACTICIDVEN Microbiology Recent Results (from the past 240 hour(s))  Blood Culture (routine x 2)     Status: None (Preliminary result)   Collection Time: 03/30/19 12:31 PM   Specimen: BLOOD  Result Value Ref Range Status    Specimen Description   Final    BLOOD BLOOD RIGHT FOREARM Performed at [facility], [place] [address] [place], [place] [address]    Special Requests   Final    BOTTLES DRAWN AEROBIC AND ANAEROBIC Blood Culture adequate volume Performed at [facility] [address], [place], [place] [address]    Culture   Final    NO GROWTH 2 DAYS Performed at [facility] [address] [place], [place] [address]    Report Status PENDING  Incomplete  Blood Culture (routine x 2)     Status: None (Preliminary result)   Collection Time: 03/30/19 12:36 PM   Specimen: BLOOD  Result Value Ref Range Status   Specimen Description   Final    BLOOD RIGHT ANTECUBITAL Performed at [facility] [address], [place], [place] [address]    Special Requests   Final    BOTTLES DRAWN AEROBIC AND ANAEROBIC Blood Culture adequate volume Performed at [facility] [address], [place], [place] [address]    Culture   Final    NO GROWTH 2 DAYS Performed at [facility] [address] [place], [place] [address]    Report Status PENDING  Incomplete  Urine culture     Status: None   Collection Time: 03/30/19 12:44 PM   Specimen: In/Out Cath Urine  Result Value Ref Range Status   Specimen Description   Final    IN/OUT CATH URINE Performed at [facility] [address] [place], [place] [address]    Special Requests   Final    NONE Performed at [facility], [place] [address] [place],  [place] [address]    Culture   Final    NO GROWTH Performed at [facility], [place] [address] [place], [place] [address]    Report Status 03/31/2019 FINAL  Final  SARS CORONAVIRUS 2 (TAT 6-24 HRS) Nasopharyngeal Nasopharyngeal Swab     Status: None   Collection Time: 03/30/19  3:57 PM   Specimen: Nasopharyngeal Swab  Result Value Ref Range Status   SARS Coronavirus 2 NEGATIVE NEGATIVE  Final    Comment: (NOTE) SARS-CoV-2 target nucleic acids are NOT DETECTED. The SARS-CoV-2 RNA is generally detectable in upper and lower respiratory specimens during the acute phase of infection. Negative results do not preclude SARS-CoV-2 infection, do not rule out co-infections with other pathogens, and should not be used as the sole basis for treatment or other patient management decisions. Negative results must be combined with clinical observations, patient history, and epidemiological information. The expected result is Negative. Fact Sheet for Patients: SugarRoll.be Fact Sheet for Healthcare Providers: https://www.woods-mathews.com/ This test is not yet approved or cleared by the [place] FDA and  has been authorized for detection and/or diagnosis of SARS-CoV-2 by FDA under an Emergency Use Authorization (EUA). This EUA will remain  in effect (meaning this test can be used) for the duration of the COVID-19 declaration under Section 56 4(b)(1) of the Act, 21 U.S.C. section 360bbb-3(b)(1), unless the authorization is terminated or revoked sooner. Performed at [facility], [place] [address], [place], [place] [address]      Time coordinating discharge: 32 minutes  SIGNED:   Hosie Poisson, MD  [facility] 04/01/2019, 5:45 PM

## 2019-04-01 NOTE — Evaluation (Signed)
Physical Therapy Evaluation Patient Details Name: [name] MRN: [id number] DOB: February 21, 1955 Today's Date: 04/01/2019   History of Present Illness  Pt is 64 year old male with prior h/o DM, hypertension, hyperlipidemia, admitted for altered mental status. As per the patient's wife he has been having memory issues and cognitive deficits since one year. Pt is currently alert and oriented person, and place. On arrival to ED, he was found to have a creatinine of 1.69, AKI, he was admitted for further evaluation.  Clinical Impression  Pt admitted with above diagnosis. Pt continues to have mild confusion/forgetfullness.  He needed min cues for safety with gait and transfers.  He required min A for transfers and ambulated 200' with RW and cues. Pt does present below his baseline mobility and would benefit from further PT to advance as he lives in 2 level home and takes care of his grandson normally. Pt currently with functional limitations due to the deficits listed below (see PT Problem List). Pt will benefit from skilled PT to increase their independence and safety with mobility to allow discharge to the venue listed below.       Follow Up Recommendations Home health PT    Equipment Recommendations  None recommended by PT    Recommendations for Other Services       Precautions / Restrictions Precautions Precautions: Fall Restrictions Weight Bearing Restrictions: No      Mobility  Bed Mobility Overal bed mobility: Needs Assistance Bed Mobility: Supine to Sit;Sit to Supine     Supine to sit: Min assist Sit to supine: Min assist   General bed mobility comments: Pt pulled up on therapist hand  Transfers Overall transfer level: Needs assistance Equipment used: Rolling walker (2 wheeled) Transfers: Sit to/from Stand Sit to Stand: Min guard         General transfer comment: VC for hand placement  Ambulation/Gait Ambulation/Gait assistance: Min guard Gait Distance (Feet): 200  Feet Assistive device: Rolling walker (2 wheeled) Gait Pattern/deviations: Step-through pattern Gait velocity: normal   General Gait Details: min cues for posture and RW proximity  Stairs            Wheelchair Mobility    Modified Rankin (Stroke Patients Only)       Balance Overall balance assessment: Needs assistance Sitting-balance support: No upper extremity supported;Feet supported Sitting balance-Leahy Scale: Normal     Standing balance support: During functional activity;No upper extremity supported Standing balance-Leahy Scale: Fair                               Pertinent Vitals/Pain Pain Assessment: 0-10 Pain Score: 4 (decreased to 0/10 after moving) Pain Location: R knee Pain Descriptors / Indicators: Tender Pain Intervention(s): Repositioned    Home Living Family/patient expects to be discharged to:: Private residence Living Arrangements: Spouse/significant other Available Help at Discharge: Family Type of Home: House Home Access: Stairs to enter Entrance Stairs-Rails: Can reach both Entrance Stairs-Number of Steps: 2 Home Layout: Multi-level Home Equipment: Walker - 2 wheels      Prior Function Level of Independence: Independent         Comments: Pt is retired; He and wife have custody of 55 yo grandson     Hand Dominance   Dominant Hand: Right    Extremity/Trunk Assessment   Upper Extremity Assessment Upper Extremity Assessment: Defer to OT evaluation    Lower Extremity Assessment Lower Extremity Assessment: Overall WFL for tasks  assessed    Cervical / Trunk Assessment Cervical / Trunk Assessment: Normal  Communication   Communication: HOH(L cochler implant)  Cognition Arousal/Alertness: Awake/alert Behavior During Therapy: WFL for tasks assessed/performed Overall Cognitive Status: No family/caregiver present to determine baseline cognitive functioning                                 General  Comments: Pt was A and O x 4 and able to follow commands.  However, did have difficulty recalling how much he used O2 at home ...kept stated "24/8 or 24 days", he was unable to say if used it at rest, activity, or full time.  Also, unable to recall grandson's name (that lives with him).  Difficulty with multi-task (unable to carry on conversation and prepare food).      General Comments General comments (skin integrity, edema, etc.): Pt on 2 LPM O2 with sats 91% rest and 92% walking.    Exercises     Assessment/Plan    PT Assessment Patient needs continued PT services  PT Problem List Decreased strength;Decreased mobility;Decreased safety awareness;Decreased coordination;Decreased activity tolerance;Cardiopulmonary status limiting activity;Decreased knowledge of use of DME;Decreased balance       PT Treatment Interventions DME instruction;Therapeutic activities;Gait training;Therapeutic exercise;Patient/family education;Stair training;Balance training;Functional mobility training;Cognitive remediation    PT Goals (Current goals can be found in the Care Plan section)  Acute Rehab PT Goals Patient Stated Goal: go home PT Goal Formulation: With patient Time For Goal Achievement: 04/15/19 Potential to Achieve Goals: Good    Frequency Min 3X/week   Barriers to discharge        Co-evaluation               AM-PAC PT "6 Clicks" Mobility  Outcome Measure Help needed turning from your back to your side while in a flat bed without using bedrails?: None Help needed moving from lying on your back to sitting on the side of a flat bed without using bedrails?: A Little Help needed moving to and from a bed to a chair (including a wheelchair)?: None Help needed standing up from a chair using your arms (e.g., wheelchair or bedside chair)?: None Help needed to walk in hospital room?: None Help needed climbing 3-5 steps with a railing? : A Little 6 Click Score: 22    End of Session  Equipment Utilized During Treatment: Gait belt;Oxygen Activity Tolerance: Patient tolerated treatment well Patient left: in bed;with call bell/phone within reach;with bed alarm set Nurse Communication: Mobility status PT Visit Diagnosis: Other abnormalities of gait and mobility (R26.89)    Time: 1130-1210 PT Time Calculation (min) (ACUTE ONLY): 40 min   Charges:   PT Evaluation $PT Eval Low Complexity: 1 Low          Maggie Font, PT Acute Rehab Services Pager [phone] [facility] [phone] [facility] [phone]   Karlton Lemon 04/01/2019, 1:55 PM

## 2019-04-01 NOTE — Evaluation (Signed)
Occupational Therapy Evaluation Patient Details Name: [name] MRN: [id number] DOB: 08-21-1955 Today's Date: 04/01/2019    History of Present Illness Pt is 64 year old male with prior h/o DM, hypertension, hyperlipidemia, admitted for altered mental status. As per the patient's wife he has been having memory issues and cognitive deficits since one year. Pt is currently alert and oriented person, and place. On arrival to ED, he was found to have a creatinine of 1.69, AKI, he was admitted for further evaluation.   Clinical Impression   Pt admitted with the above. Pt currently with functional limitations due to the deficits listed below (see OT Problem List).  Pt will benefit from skilled OT to increase their safety and independence with ADL and functional mobility for ADL to facilitate discharge to venue listed below.   Pt states wife will A as needed.       Follow Up Recommendations  Home health OT;Supervision/Assistance - 24 hour    Equipment Recommendations  None recommended by OT    Recommendations for Other Services       Precautions / Restrictions Precautions Precautions: Fall      Mobility Bed Mobility Overal bed mobility: Needs Assistance Bed Mobility: Supine to Sit;Sit to Supine     Supine to sit: Min assist Sit to supine: Min assist      Transfers Overall transfer level: Needs assistance Equipment used: Rolling walker (2 wheeled) Transfers: Sit to/from Stand Sit to Stand: Min assist         General transfer comment: VC for hand placement        ADL either performed or assessed with clinical judgement   ADL Overall ADL's : Needs assistance/impaired Eating/Feeding: Set up;Sitting   Grooming: Wash/dry hands;Wash/dry face;Standing;Set up   Upper Body Bathing: Set up;Sitting   Lower Body Bathing: Moderate assistance;Sit to/from stand;Cueing for safety;Cueing for compensatory techniques   Upper Body Dressing : Sitting;Set up   Lower Body  Dressing: Moderate assistance;Sit to/from stand;Cueing for safety;Cueing for compensatory techniques   Toilet Transfer: Moderate assistance;Stand-pivot;Cueing for safety;Cueing for sequencing;RW   Toileting- Clothing Manipulation and Hygiene: Moderate assistance;Sit to/from stand;Cueing for safety;Cueing for compensatory techniques;Cueing for sequencing         General ADL Comments: pt wet in the bed.  Condom cat off.  CNA notified and came to replace     Vision Patient Visual Report: No change from baseline              Pertinent Vitals/Pain Pain Assessment: No/denies pain     Hand Dominance     Extremity/Trunk Assessment Upper Extremity Assessment Upper Extremity Assessment: Generalized weakness           Communication Communication Communication: HOH(cochlear implant L ear)   Cognition Arousal/Alertness: Awake/alert Behavior During Therapy: WFL for tasks assessed/performed Overall Cognitive Status: Within Functional Limits for tasks assessed                                                Home Living Family/patient expects to be discharged to:: Private residence Living Arrangements: Spouse/significant other Available Help at Discharge: Family Type of Home: House Home Access: Stairs to enter Technical brewer of Steps: 2 Entrance Stairs-Rails: Can reach both Home Layout: Multi-level Alternate Level Stairs-Number of Steps: flight Alternate Level Stairs-Rails: Right Bathroom Shower/Tub: Occupational psychologist: Handicapped height  Home Equipment: [place] - 2 wheels          Prior Functioning/Environment Level of Independence: Independent        Comments: Pt is retired        Secretary/administrator Problem List: Decreased strength;Decreased activity tolerance;Impaired balance (sitting and/or standing)      OT Treatment/Interventions: Self-care/ADL training;Patient/family education;DME and/or AE instruction    OT Goals(Current  goals can be found in the care plan section) Acute Rehab OT Goals Patient Stated Goal: go home OT Goal Formulation: With patient Time For Goal Achievement: 04/08/19 Potential to Achieve Goals: Good  OT Frequency: Min 2X/week    AM-PAC OT "6 Clicks" Daily Activity     Outcome Measure Help from another person eating meals?: None Help from another person taking care of personal grooming?: A Little Help from another person toileting, which includes using toliet, bedpan, or urinal?: A Little Help from another person bathing (including washing, rinsing, drying)?: A Lot Help from another person to put on and taking off regular upper body clothing?: A Little Help from another person to put on and taking off regular lower body clothing?: A Lot 6 Click Score: 17   End of Session Equipment Utilized During Treatment: Rolling walker Nurse Communication: Mobility status  Activity Tolerance: Patient tolerated treatment well Patient left: in bed;with call bell/phone within reach;with nursing/sitter in room  OT Visit Diagnosis: Unsteadiness on feet (R26.81);Other abnormalities of gait and mobility (R26.89);Muscle weakness (generalized) (M62.81);History of falling (Z91.81)                Time: 1300-1315 OT Time Calculation (min): 15 min Charges:  OT General Charges $OT Visit: 1 Visit OT Evaluation $OT Eval Low Complexity: 1 Low  Kari Baars, OT Acute Rehabilitation Services [phone] Office- [phone], Edwena Felty D 04/01/2019, 1:47 PM

## 2019-04-01 NOTE — Progress Notes (Signed)
PROGRESS NOTE    [name]  [id number] DOB: May 17, 1955 DOA: 03/30/2019 PCP: Leanna Battles, MD    Brief Narrative:  64 year old male with prior h/o DM, hypertension, hyperlipidemia, admitted for altered mental status. As per the patient's wife he has been having memory issues and cognitive deficits since one year and chronic respiratory failure with hypoxia requiring upto 3lit of [place] oxygen.  On arrival to ED, he was found to have a creatinine of 1.69, AKI, he was admitted for further evaluation of acute encephalopathy, AKI and evaluation of hypoxia.  Assessment & Plan:   Active Problems:   AMS (altered mental status)   Acute encephalopathy   Acute metabolic encephalopathy:  Probably from AKI is from hypoglycemia versus medications. Pt is alert and oriented and appears to be back to baseline.  Ammonia level is 16, TSH is within normal limits.  Vitamin B12 levels are 219. CT of the head without contrast on admission shows No evidence of acute intracranial abnormality. No evidence of calvarial fracture. MRI couldn't be done, and patient's wife would like to get it done as an outpatient in an open MRI setting.  AKI: pts baseline creatinine is around 1.2 and on admission is 1.6. slight worsening to 1.75.  Urine sodium is 100, urine creatinine is 99, ultrasound renal does not show any hydronephrosis.  Urine analysis shows urine protein of 100.  Urine analysis is negative for infection.  Suspect patient's new baseline appears to be around 1.6.    Hypertension:  Well-controlled blood pressure parameters, continue with metoprolol and losartan.Marland Kitchen   Hyperlipidemia:  Resume zocor.   Diabetes Mellitus: Suboptimally controlled with hypoglycemia in the last 24 hours.  Patient's Lantus and glipizide were discontinued and CBGs have improved patient's hemoglobin A1c is 7.2. CBG (last 3)  Recent Labs    03/31/19 2207 04/01/19 0800 04/01/19 1203  GLUCAP 90 92 133*   Resume sliding  scale insulin and discontinue long-acting insulin regimens and glipizide on discharge.   Chronic respiratory failure with hypoxia requiring up to 3 L of nasal cannula oxygen of unclear etiology As per the patient's wife patient is always sitting and sleeping in his recliner and does not ambulate much over the last 6 months.  Patient was tested negative for Covid 19. But his D-dimer is elevated at 1.58, will get VQ scan to rule out PE.   Iron deficiency anemia Patient's baseline hemoglobin ranges between 9-11.  Hemoglobin is at 9.6 , anemia panel show low iron levels and ferritin of 96 with adequate folate and vitamin B12 levels. Iron supplementation added to his regimen. Repeat CBC ordered.    Mild thrombocytopenia Unclear etiology, no signs of active bleeding.  Continue to monitor  DVT prophylaxis: scd's Code Status: full code.  Family Communication: none at bedside.  Disposition Plan:  . Patient came from: [facility]            . Anticipated d/c place: Possibly home with home PT and OT in the next 24-hour once VQ scan results are in.  Barriers to d/c OR conditions which need to be met to effect a safe d/c: Further work-up for hypoxia is pending, VQ scan's are pending  Consultants:   None.    Procedures:   MRI brain without contrast.   Antimicrobials: None.   Subjective: Patient appears to be in good spirits, denies any chest pain has some shortness of breath on minimal activity, no when he can go home. No nausea vomiting or abdominal pain  Objective: Vitals:   04/01/19 0626 04/01/19 0935 04/01/19 1208 04/01/19 1323  BP: 112/60 (!) 141/46 (!) 153/47   Pulse: (!) 51 65 62   Resp: '15 17 16   '$ Temp: 97.8 F (36.6 C) 99.1 F (37.3 C) 98.1 F (36.7 C)   TempSrc: Oral Oral Oral   SpO2: 96% 94% 93%   Weight:    74.4 kg    Intake/Output Summary (Last 24 hours) at 04/01/2019 1408 Last data filed at 04/01/2019 1329 Gross per 24 hour  Intake 2521.73 ml  Output 1550 ml  Net  971.73 ml   Filed Weights   04/01/19 1323  Weight: 74.4 kg    Examination:  General exam: Alert and comfortable on 2 L of nasal cannula oxygen at rest Respiratory system: Clear to auscultation bilaterally, no wheezing or rhonchi  cardiovascular system: S1-S2 heard, regular rate rhythm, no JVD, no pedal edema Gastrointestinal system: Abdomen is soft, nontender, nondistended bowel sounds normal Central nervous system: Alert and able to answer all questions, following commands, no focal deficits Extremities: No pedal edema Skin: No rashes seen Psychiatry: Mood is appropriate    Data Reviewed: I have personally reviewed following labs and imaging studies  CBC: Recent Labs  Lab 03/30/19 1231 03/31/19 0415  WBC 4.5 2.8*  NEUTROABS 3.6  --   HGB 11.4* 9.6*  HCT 34.8* 29.8*  MCV 88.3 89.0  PLT 131* 323*   Basic Metabolic Panel: Recent Labs  Lab 03/30/19 1231 03/31/19 0415 04/01/19 0836  NA 137 139 138  K 4.0 3.9 4.0  CL 96* 101 103  CO2 32 29 27  GLUCOSE 119* 79 106*  BUN 33* 29* 32*  CREATININE 1.69* 1.68* 1.75*  CALCIUM 8.5* 8.3* 8.2*   GFR: CrCl cannot be calculated (Unknown ideal weight.). Liver Function Tests: Recent Labs  Lab 03/30/19 1231 03/31/19 0415  AST 18 33  ALT 11 14  ALKPHOS 70 52  BILITOT 0.8 0.8  PROT 6.7 6.1*  ALBUMIN 3.7 3.3*   No results for input(s): LIPASE, AMYLASE in the last 168 hours. Recent Labs  Lab 03/31/19 1047  AMMONIA 16   Coagulation Profile: Recent Labs  Lab 03/30/19 1231  INR 1.1   Cardiac Enzymes: No results for input(s): CKTOTAL, CKMB, CKMBINDEX, TROPONINI in the last 168 hours. BNP (last 3 results) No results for input(s): PROBNP in the last 8760 hours. HbA1C: Recent Labs    03/31/19 0415  HGBA1C 7.2*   CBG: Recent Labs  Lab 03/31/19 1731 03/31/19 1816 03/31/19 2207 04/01/19 0800 04/01/19 1203  GLUCAP 56* 74 90 92 133*   Lipid Profile: No results for input(s): CHOL, HDL, LDLCALC, TRIG,  CHOLHDL, LDLDIRECT in the last 72 hours. Thyroid Function Tests: Recent Labs    03/31/19 1047  TSH 1.629   Anemia Panel: Recent Labs    03/31/19 1047  VITAMINB12 219  FOLATE 12.3  FERRITIN 96  TIBC 248*  IRON 18*  RETICCTPCT 1.9   Sepsis Labs: Recent Labs  Lab 03/30/19 1231 03/31/19 1047  PROCALCITON  --  0.23  LATICACIDVEN 1.5  --     Recent Results (from the past 240 hour(s))  Blood Culture (routine x 2)     Status: None (Preliminary result)   Collection Time: 03/30/19 12:31 PM   Specimen: BLOOD  Result Value Ref Range Status   Specimen Description   Final    BLOOD BLOOD RIGHT FOREARM Performed at [facility], [place] [address], [place], [place] [address]  Special Requests   Final    BOTTLES DRAWN AEROBIC AND ANAEROBIC Blood Culture adequate volume Performed at [facility] [address] [place], [place] [address]    Culture   Final    NO GROWTH 2 DAYS Performed at [facility] [address] [place], [place] [address]    Report Status PENDING  Incomplete  Blood Culture (routine x 2)     Status: None (Preliminary result)   Collection Time: 03/30/19 12:36 PM   Specimen: BLOOD  Result Value Ref Range Status   Specimen Description   Final    BLOOD RIGHT ANTECUBITAL Performed at [facility] [address] [place], [place] [address]    Special Requests   Final    BOTTLES DRAWN AEROBIC AND ANAEROBIC Blood Culture adequate volume Performed at [facility] [address], [place], [place] [address]    Culture   Final    NO GROWTH 2 DAYS Performed at [facility] [address], [place], [place] [address]    Report Status PENDING  Incomplete  Urine culture     Status: None   Collection Time: 03/30/19 12:44 PM   Specimen: In/Out Cath Urine  Result Value Ref Range Status   Specimen Description   Final    IN/OUT CATH URINE Performed at [facility] [address], [place], [place]  [address]    Special Requests   Final    NONE Performed at [facility], [place] [address], [place], [place] [address]    Culture   Final    NO GROWTH Performed at [facility], [place] [address] [place], [place] [address]    Report Status 03/31/2019 FINAL  Final  SARS CORONAVIRUS 2 (TAT 6-24 HRS) Nasopharyngeal Nasopharyngeal Swab     Status: None   Collection Time: 03/30/19  3:57 PM   Specimen: Nasopharyngeal Swab  Result Value Ref Range Status   SARS Coronavirus 2 NEGATIVE NEGATIVE Final    Comment: (NOTE) SARS-CoV-2 target nucleic acids are NOT DETECTED. The SARS-CoV-2 RNA is generally detectable in upper and lower respiratory specimens during the acute phase of infection. Negative results do not preclude SARS-CoV-2 infection, do not rule out co-infections with other pathogens, and should not be used as the sole basis for treatment or other patient management decisions. Negative results must be combined with clinical observations, patient history, and epidemiological information. The expected result is Negative. Fact Sheet for Patients: SugarRoll.be Fact Sheet for Healthcare Providers: https://www.woods-mathews.com/ This test is not yet approved or cleared by the [place] FDA and  has been authorized for detection and/or diagnosis of SARS-CoV-2 by FDA under an Emergency Use Authorization (EUA). This EUA will remain  in effect (meaning this test can be used) for the duration of the COVID-19 declaration under Section 56 4(b)(1) of the Act, 21 U.S.C. section 360bbb-3(b)(1), unless the authorization is terminated or revoked sooner. Performed at [facility], [place] [address], [place], [place] [address]          Radiology Studies: US RENAL  Result Date: 03/31/2019 CLINICAL DATA:  AKI EXAM: RENAL / URINARY TRACT ULTRASOUND COMPLETE COMPARISON:  None.  FINDINGS: Right Kidney: Renal measurements: 10.5 x 4.5 x 4.4 cm = volume: 109 mL . Echogenicity within normal limits. No mass or hydronephrosis visualized. Left Kidney: Renal measurements: 10.5 x 4.9 x 5.5 cm = volume: 148 mL. Echogenicity within normal limits. No mass or hydronephrosis  visualized. Bladder: Appears normal for degree of bladder distention. Other: None. IMPRESSION: Normal renal ultrasound. Electronically Signed   By: Kathreen Devoid   On: 03/31/2019 14:30        Scheduled Meds: . amLODipine  5 mg Oral Daily  . aspirin EC  81 mg Oral Daily  . divalproex  250 mg Oral QHS  . enoxaparin (LOVENOX) injection  40 mg Subcutaneous Q24H  . feeding supplement (PRO-STAT SUGAR FREE 64)  30 mL Oral BID  . gabapentin  300 mg Oral TID  . insulin aspart  0-15 Units Subcutaneous TID WC  . insulin aspart  0-5 Units Subcutaneous QHS  . losartan  100 mg Oral Daily  . methadone  20 mg Oral Q12H  . metoprolol tartrate  25 mg Oral QHS  . metoprolol tartrate  50 mg Oral BID  . multivitamin with minerals  1 tablet Oral Daily  . sertraline  50 mg Oral QHS  . simvastatin  20 mg Oral QHS   Continuous Infusions:    LOS: 1 day     Hosie Poisson, MD [facility]   To contact the attending provider between 7A-7P or the covering provider during after hours 7P-7A, please log into the web site [email or web address] and access using universal [place] password for that web site. If you do not have the password, please call the hospital operator.  04/01/2019, 2:08 PM

## 2019-04-04 LAB — CULTURE, BLOOD (ROUTINE X 2)
Culture: NO GROWTH
Culture: NO GROWTH
Special Requests: ADEQUATE
Special Requests: ADEQUATE

## 2019-04-07 DIAGNOSIS — M545 Low back pain: Secondary | ICD-10-CM | POA: Diagnosis not present

## 2019-04-07 DIAGNOSIS — Z7409 Other reduced mobility: Secondary | ICD-10-CM | POA: Diagnosis not present

## 2019-04-07 DIAGNOSIS — Z789 Other specified health status: Secondary | ICD-10-CM | POA: Diagnosis not present

## 2019-04-07 DIAGNOSIS — R2681 Unsteadiness on feet: Secondary | ICD-10-CM | POA: Diagnosis not present

## 2019-04-07 DIAGNOSIS — G8929 Other chronic pain: Secondary | ICD-10-CM | POA: Diagnosis not present

## 2019-04-10 DIAGNOSIS — G8929 Other chronic pain: Secondary | ICD-10-CM | POA: Diagnosis not present

## 2019-04-10 DIAGNOSIS — Z7409 Other reduced mobility: Secondary | ICD-10-CM | POA: Diagnosis not present

## 2019-04-10 DIAGNOSIS — Z789 Other specified health status: Secondary | ICD-10-CM | POA: Diagnosis not present

## 2019-04-10 DIAGNOSIS — R2681 Unsteadiness on feet: Secondary | ICD-10-CM | POA: Diagnosis not present

## 2019-04-10 DIAGNOSIS — M545 Low back pain: Secondary | ICD-10-CM | POA: Diagnosis not present

## 2019-04-14 DIAGNOSIS — G8929 Other chronic pain: Secondary | ICD-10-CM | POA: Diagnosis not present

## 2019-04-14 DIAGNOSIS — Z7409 Other reduced mobility: Secondary | ICD-10-CM | POA: Diagnosis not present

## 2019-04-14 DIAGNOSIS — M7501 Adhesive capsulitis of right shoulder: Secondary | ICD-10-CM | POA: Diagnosis not present

## 2019-04-14 DIAGNOSIS — R2681 Unsteadiness on feet: Secondary | ICD-10-CM | POA: Diagnosis not present

## 2019-04-14 DIAGNOSIS — Z789 Other specified health status: Secondary | ICD-10-CM | POA: Diagnosis not present

## 2019-04-14 DIAGNOSIS — M545 Low back pain: Secondary | ICD-10-CM | POA: Diagnosis not present

## 2019-04-17 DIAGNOSIS — G8929 Other chronic pain: Secondary | ICD-10-CM | POA: Diagnosis not present

## 2019-04-17 DIAGNOSIS — M545 Low back pain: Secondary | ICD-10-CM | POA: Diagnosis not present

## 2019-04-17 DIAGNOSIS — M25511 Pain in right shoulder: Secondary | ICD-10-CM | POA: Diagnosis not present

## 2019-04-17 DIAGNOSIS — Z7409 Other reduced mobility: Secondary | ICD-10-CM | POA: Diagnosis not present

## 2019-04-17 DIAGNOSIS — R2681 Unsteadiness on feet: Secondary | ICD-10-CM | POA: Diagnosis not present

## 2019-04-17 DIAGNOSIS — Z789 Other specified health status: Secondary | ICD-10-CM | POA: Diagnosis not present

## 2019-04-23 DIAGNOSIS — Z45321 Encounter for adjustment and management of cochlear device: Secondary | ICD-10-CM | POA: Diagnosis not present

## 2019-04-23 DIAGNOSIS — Z9621 Cochlear implant status: Secondary | ICD-10-CM | POA: Diagnosis not present

## 2019-04-23 DIAGNOSIS — H903 Sensorineural hearing loss, bilateral: Secondary | ICD-10-CM | POA: Diagnosis not present

## 2019-04-25 DIAGNOSIS — M25511 Pain in right shoulder: Secondary | ICD-10-CM | POA: Diagnosis not present

## 2019-04-25 DIAGNOSIS — G8929 Other chronic pain: Secondary | ICD-10-CM | POA: Diagnosis not present

## 2019-04-25 DIAGNOSIS — M7501 Adhesive capsulitis of right shoulder: Secondary | ICD-10-CM | POA: Diagnosis not present

## 2019-04-25 DIAGNOSIS — Z789 Other specified health status: Secondary | ICD-10-CM | POA: Diagnosis not present

## 2019-04-25 DIAGNOSIS — M545 Low back pain: Secondary | ICD-10-CM | POA: Diagnosis not present

## 2019-04-25 DIAGNOSIS — Z7409 Other reduced mobility: Secondary | ICD-10-CM | POA: Diagnosis not present

## 2019-04-25 DIAGNOSIS — R2681 Unsteadiness on feet: Secondary | ICD-10-CM | POA: Diagnosis not present

## 2019-04-28 DIAGNOSIS — Z7409 Other reduced mobility: Secondary | ICD-10-CM | POA: Diagnosis not present

## 2019-04-28 DIAGNOSIS — M7501 Adhesive capsulitis of right shoulder: Secondary | ICD-10-CM | POA: Diagnosis not present

## 2019-04-28 DIAGNOSIS — M25511 Pain in right shoulder: Secondary | ICD-10-CM | POA: Diagnosis not present

## 2019-04-28 DIAGNOSIS — R2681 Unsteadiness on feet: Secondary | ICD-10-CM | POA: Diagnosis not present

## 2019-04-28 DIAGNOSIS — Z789 Other specified health status: Secondary | ICD-10-CM | POA: Diagnosis not present

## 2019-04-28 DIAGNOSIS — M545 Low back pain: Secondary | ICD-10-CM | POA: Diagnosis not present

## 2019-04-28 DIAGNOSIS — G8929 Other chronic pain: Secondary | ICD-10-CM | POA: Diagnosis not present

## 2019-05-01 DIAGNOSIS — Z7409 Other reduced mobility: Secondary | ICD-10-CM | POA: Diagnosis not present

## 2019-05-01 DIAGNOSIS — M25511 Pain in right shoulder: Secondary | ICD-10-CM | POA: Diagnosis not present

## 2019-05-01 DIAGNOSIS — M7501 Adhesive capsulitis of right shoulder: Secondary | ICD-10-CM | POA: Diagnosis not present

## 2019-05-01 DIAGNOSIS — G8929 Other chronic pain: Secondary | ICD-10-CM | POA: Diagnosis not present

## 2019-05-01 DIAGNOSIS — Z789 Other specified health status: Secondary | ICD-10-CM | POA: Diagnosis not present

## 2019-05-01 DIAGNOSIS — M545 Low back pain: Secondary | ICD-10-CM | POA: Diagnosis not present

## 2019-05-01 DIAGNOSIS — R2681 Unsteadiness on feet: Secondary | ICD-10-CM | POA: Diagnosis not present

## 2019-05-07 ENCOUNTER — Other Ambulatory Visit: Payer: Self-pay | Admitting: Cardiology

## 2019-05-08 DIAGNOSIS — Z7409 Other reduced mobility: Secondary | ICD-10-CM | POA: Diagnosis not present

## 2019-05-08 DIAGNOSIS — M7501 Adhesive capsulitis of right shoulder: Secondary | ICD-10-CM | POA: Diagnosis not present

## 2019-05-08 DIAGNOSIS — Z789 Other specified health status: Secondary | ICD-10-CM | POA: Diagnosis not present

## 2019-05-08 DIAGNOSIS — R2681 Unsteadiness on feet: Secondary | ICD-10-CM | POA: Diagnosis not present

## 2019-05-08 DIAGNOSIS — M545 Low back pain: Secondary | ICD-10-CM | POA: Diagnosis not present

## 2019-05-08 DIAGNOSIS — G8929 Other chronic pain: Secondary | ICD-10-CM | POA: Diagnosis not present

## 2019-05-08 DIAGNOSIS — M25511 Pain in right shoulder: Secondary | ICD-10-CM | POA: Diagnosis not present

## 2019-05-12 DIAGNOSIS — M545 Low back pain: Secondary | ICD-10-CM | POA: Diagnosis not present

## 2019-05-12 DIAGNOSIS — Z720 Tobacco use: Secondary | ICD-10-CM | POA: Diagnosis not present

## 2019-05-12 DIAGNOSIS — E1151 Type 2 diabetes mellitus with diabetic peripheral angiopathy without gangrene: Secondary | ICD-10-CM | POA: Diagnosis not present

## 2019-05-12 DIAGNOSIS — E538 Deficiency of other specified B group vitamins: Secondary | ICD-10-CM | POA: Diagnosis not present

## 2019-05-12 DIAGNOSIS — I739 Peripheral vascular disease, unspecified: Secondary | ICD-10-CM | POA: Diagnosis not present

## 2019-05-12 DIAGNOSIS — R0609 Other forms of dyspnea: Secondary | ICD-10-CM | POA: Diagnosis not present

## 2019-05-12 DIAGNOSIS — J449 Chronic obstructive pulmonary disease, unspecified: Secondary | ICD-10-CM | POA: Diagnosis not present

## 2019-05-12 DIAGNOSIS — I10 Essential (primary) hypertension: Secondary | ICD-10-CM | POA: Diagnosis not present

## 2019-05-12 DIAGNOSIS — M7501 Adhesive capsulitis of right shoulder: Secondary | ICD-10-CM | POA: Diagnosis not present

## 2019-05-12 DIAGNOSIS — E7849 Other hyperlipidemia: Secondary | ICD-10-CM | POA: Diagnosis not present

## 2019-05-15 DIAGNOSIS — Z9621 Cochlear implant status: Secondary | ICD-10-CM | POA: Diagnosis not present

## 2019-05-15 DIAGNOSIS — Z45321 Encounter for adjustment and management of cochlear device: Secondary | ICD-10-CM | POA: Diagnosis not present

## 2019-05-15 DIAGNOSIS — H903 Sensorineural hearing loss, bilateral: Secondary | ICD-10-CM | POA: Diagnosis not present

## 2019-05-19 DIAGNOSIS — M7501 Adhesive capsulitis of right shoulder: Secondary | ICD-10-CM | POA: Diagnosis not present

## 2019-05-19 DIAGNOSIS — M545 Low back pain: Secondary | ICD-10-CM | POA: Diagnosis not present

## 2019-05-19 DIAGNOSIS — G8929 Other chronic pain: Secondary | ICD-10-CM | POA: Diagnosis not present

## 2019-05-19 DIAGNOSIS — M25511 Pain in right shoulder: Secondary | ICD-10-CM | POA: Diagnosis not present

## 2019-05-19 DIAGNOSIS — Z789 Other specified health status: Secondary | ICD-10-CM | POA: Diagnosis not present

## 2019-05-19 DIAGNOSIS — Z7409 Other reduced mobility: Secondary | ICD-10-CM | POA: Diagnosis not present

## 2019-05-19 DIAGNOSIS — R2681 Unsteadiness on feet: Secondary | ICD-10-CM | POA: Diagnosis not present

## 2019-06-02 DIAGNOSIS — E1140 Type 2 diabetes mellitus with diabetic neuropathy, unspecified: Secondary | ICD-10-CM | POA: Diagnosis not present

## 2019-06-02 DIAGNOSIS — G894 Chronic pain syndrome: Secondary | ICD-10-CM | POA: Diagnosis not present

## 2019-06-02 DIAGNOSIS — M25572 Pain in left ankle and joints of left foot: Secondary | ICD-10-CM | POA: Diagnosis not present

## 2019-06-02 DIAGNOSIS — M19272 Secondary osteoarthritis, left ankle and foot: Secondary | ICD-10-CM | POA: Diagnosis not present

## 2019-07-08 DIAGNOSIS — Z9621 Cochlear implant status: Secondary | ICD-10-CM | POA: Diagnosis not present

## 2019-07-08 DIAGNOSIS — Z45321 Encounter for adjustment and management of cochlear device: Secondary | ICD-10-CM | POA: Diagnosis not present

## 2019-07-08 DIAGNOSIS — H903 Sensorineural hearing loss, bilateral: Secondary | ICD-10-CM | POA: Diagnosis not present

## 2019-08-04 DIAGNOSIS — M19272 Secondary osteoarthritis, left ankle and foot: Secondary | ICD-10-CM | POA: Diagnosis not present

## 2019-08-04 DIAGNOSIS — E1140 Type 2 diabetes mellitus with diabetic neuropathy, unspecified: Secondary | ICD-10-CM | POA: Diagnosis not present

## 2019-08-04 DIAGNOSIS — M25572 Pain in left ankle and joints of left foot: Secondary | ICD-10-CM | POA: Diagnosis not present

## 2019-08-04 DIAGNOSIS — G894 Chronic pain syndrome: Secondary | ICD-10-CM | POA: Diagnosis not present

## 2019-08-15 DIAGNOSIS — Z720 Tobacco use: Secondary | ICD-10-CM | POA: Diagnosis not present

## 2019-08-15 DIAGNOSIS — I739 Peripheral vascular disease, unspecified: Secondary | ICD-10-CM | POA: Diagnosis not present

## 2019-08-15 DIAGNOSIS — R600 Localized edema: Secondary | ICD-10-CM | POA: Diagnosis not present

## 2019-08-15 DIAGNOSIS — J9610 Chronic respiratory failure, unspecified whether with hypoxia or hypercapnia: Secondary | ICD-10-CM | POA: Diagnosis not present

## 2019-08-15 DIAGNOSIS — E1151 Type 2 diabetes mellitus with diabetic peripheral angiopathy without gangrene: Secondary | ICD-10-CM | POA: Diagnosis not present

## 2019-08-15 DIAGNOSIS — M7022 Olecranon bursitis, left elbow: Secondary | ICD-10-CM | POA: Diagnosis not present

## 2019-08-15 DIAGNOSIS — M545 Low back pain: Secondary | ICD-10-CM | POA: Diagnosis not present

## 2019-08-15 DIAGNOSIS — H9193 Unspecified hearing loss, bilateral: Secondary | ICD-10-CM | POA: Diagnosis not present

## 2019-08-15 DIAGNOSIS — G4737 Central sleep apnea in conditions classified elsewhere: Secondary | ICD-10-CM | POA: Diagnosis not present

## 2019-08-15 DIAGNOSIS — R05 Cough: Secondary | ICD-10-CM | POA: Diagnosis not present

## 2019-08-15 DIAGNOSIS — I10 Essential (primary) hypertension: Secondary | ICD-10-CM | POA: Diagnosis not present

## 2019-08-15 DIAGNOSIS — L89159 Pressure ulcer of sacral region, unspecified stage: Secondary | ICD-10-CM | POA: Diagnosis not present

## 2019-09-01 DIAGNOSIS — E1140 Type 2 diabetes mellitus with diabetic neuropathy, unspecified: Secondary | ICD-10-CM | POA: Diagnosis not present

## 2019-09-01 DIAGNOSIS — M25572 Pain in left ankle and joints of left foot: Secondary | ICD-10-CM | POA: Diagnosis not present

## 2019-09-01 DIAGNOSIS — M19272 Secondary osteoarthritis, left ankle and foot: Secondary | ICD-10-CM | POA: Diagnosis not present

## 2019-09-01 DIAGNOSIS — G894 Chronic pain syndrome: Secondary | ICD-10-CM | POA: Diagnosis not present

## 2019-10-13 DIAGNOSIS — G894 Chronic pain syndrome: Secondary | ICD-10-CM | POA: Diagnosis not present

## 2019-10-13 DIAGNOSIS — M25572 Pain in left ankle and joints of left foot: Secondary | ICD-10-CM | POA: Diagnosis not present

## 2019-10-13 DIAGNOSIS — M19272 Secondary osteoarthritis, left ankle and foot: Secondary | ICD-10-CM | POA: Diagnosis not present

## 2019-10-13 DIAGNOSIS — E1140 Type 2 diabetes mellitus with diabetic neuropathy, unspecified: Secondary | ICD-10-CM | POA: Diagnosis not present

## 2019-10-27 DIAGNOSIS — J9610 Chronic respiratory failure, unspecified whether with hypoxia or hypercapnia: Secondary | ICD-10-CM | POA: Diagnosis not present

## 2019-10-27 DIAGNOSIS — J449 Chronic obstructive pulmonary disease, unspecified: Secondary | ICD-10-CM | POA: Diagnosis not present

## 2019-11-19 DIAGNOSIS — M25572 Pain in left ankle and joints of left foot: Secondary | ICD-10-CM | POA: Diagnosis not present

## 2019-11-19 DIAGNOSIS — M19272 Secondary osteoarthritis, left ankle and foot: Secondary | ICD-10-CM | POA: Diagnosis not present

## 2019-11-19 DIAGNOSIS — E1140 Type 2 diabetes mellitus with diabetic neuropathy, unspecified: Secondary | ICD-10-CM | POA: Diagnosis not present

## 2019-11-19 DIAGNOSIS — G894 Chronic pain syndrome: Secondary | ICD-10-CM | POA: Diagnosis not present

## 2019-12-15 ENCOUNTER — Ambulatory Visit: Payer: Medicare Other

## 2019-12-15 ENCOUNTER — Telehealth: Payer: Self-pay | Admitting: Pulmonary Disease

## 2019-12-15 NOTE — Telephone Encounter (Signed)
Called and spoke with pt's wife [name] who states that pt has had increased SOB. Pt is wearing between 4-4.5L O2 and with O2, sats will range from 82%-90% on O2 and if pt does not have O2 on, sats will drop down to the 70s.  [name] stated that pt was hospitalized twice in 2020 and also once early 2021 and since the first hospital stay 03/2018, pt has been having problems with health.  Pt has had two of his covid vaccines as he received moderna vaccine. Stated to [place] for pt to bring card with him to appt so we can get these dates added.  I have scheduled pt an appt to be seen by [facility] tomorrow 11/30 at 2pm. Per Beth, have pt arrive early for cxr. Stated this to [place] and she verbalized understanding. Nothing further needed.

## 2019-12-15 NOTE — Progress Notes (Signed)
@Patient  ID: [name], male    DOB: Jul 24, 1955, 64 y.o.   MRN: [id number]  Chief Complaint  Patient presents with  . Follow-up    Chest xtray performed today.  Pt's wife states that pt has been declining since last visit. States he has been losing weight. Pt wears 4.5L with home concentrator and uses 4L pulse with POC. DME: [employer]    Referring provider: Leanna Battles, MD  HPI: 64 year old male, current smoker. He had quit in February 2020 but restarted (40 pack year hx). PMH severe central sleep apnea, significant for chronic respiratory failure with hypoxia, healthcare associated pneumonia. Patient of Dr. Vaughan Browner, last seen by pulmonary NP on 09/09/19.  12/16/2019 Patient presents today for follow-up. He has not been seen since August 2021. Reports increased shortness of breath over the last year and a half. He has been hospitalized several times and wife reports that he has not been the same since hospital stay in March 2020 for hypoxemic respiratory failure . Intolerant of of BIPAP. He previously was following with Dr. Raeanne Gathers with [facility] neurology for hx central sleep apnea. He was last seen by their office in September 2020. He had a prior sleep study on 08/21/2017, started CPAP 09/16/08 transition BIPAP 10/10/19. No currently on therapy, unable to tolerate PAP. He falls asleep during the day easily. Sleeps in his recliner. He takes Doxepin for sleep and is also on Depakote and sertraline. Follows with pain management for lower extremity, back and shoulder pain and is working on decreasing medications.   He was started on Anoro back in June 2020 by Dr. Vaughan Browner for suspected COPD d/t clinical symptoms and smoking hx. Continues supplemental oxygen. Pulmonary function testing on 09/04/19 showed severe obstruction with reversibility. Severe diffusion defect. He is compliant with Anoro. He has a prescription for Qvar, recently started using this several days ago and has noticed some improvement.  He has been wearing 4.5L oxygen most of the day.  He's lost 5 lbs in the last month.    Allergies  Allergen Reactions  . Codeine Nausea And Vomiting  . Tetracyclines & Related Hives    Immunization History  Administered Date(s) Administered  . Influenza, High Dose Seasonal PF 09/17/2019  . Moderna SARS-COVID-2 Vaccination 03/01/2019, 03/29/2019  . Tdap 09/20/2018    Past Medical History:  Diagnosis Date  . Anxiety   . Arthritis   . Chronic narcotic use   . Complication of anesthesia    h/o aspiration during back surgery  . Depression   . Diabetic peripheral neuropathy ([place])   . Hearing loss    wears bilateral hearing aids, bilat moderate SNHL  . Hyperlipidemia   . Hypertension   . Insomnia   . Tobacco abuse   . Type 1 diabetes mellitus ([place])   . Ulcer of left ankle ([place])   . Vision abnormalities    mild DM retinopathy    Tobacco History: Social History   Tobacco Use  Smoking Status Former Smoker  . Packs/day: 1.00  . Years: 40.00  . Pack years: 40.00  . Types: Cigarettes  . Quit date: 02/16/2018  . Years since quitting: 1.8  Smokeless Tobacco Never Used   Counseling given: Not Answered   Outpatient Medications Prior to Visit  Medication Sig Dispense Refill  . amLODipine (NORVASC) 5 MG tablet Take 5 mg by mouth daily.    Marland Kitchen aspirin EC 81 MG tablet Take 81 mg by mouth daily.    . divalproex (DEPAKOTE)  250 MG DR tablet Take 250 mg by mouth daily.    Marland Kitchen doxepin (SINEQUAN) 25 MG capsule Take 25 mg by mouth at bedtime.    . furosemide (LASIX) 20 MG tablet TAKE 1 TABLET EVERY DAY (Patient taking differently: Take 20 mg by mouth every other day. ) 90 tablet 0  . gabapentin (NEURONTIN) 300 MG capsule Take 300 mg by mouth 2 (two) times daily.     Marland Kitchen losartan (COZAAR) 100 MG tablet Take 100 mg by mouth daily.    . methadone (DOLOPHINE) 10 MG tablet Take 20 mg by mouth every 12 (twelve) hours.    . metoprolol tartrate (LOPRESSOR) 25 MG tablet Take 25-50 mg by mouth See  admin instructions. Take 2 tablets (50 mg) by mouth every morning and 1 tablet (25 mg) at bedtime    . OXYGEN Inhale into the lungs. 3 liters    . sertraline (ZOLOFT) 50 MG tablet Take 50 mg by mouth every other day.     . simvastatin (ZOCOR) 20 MG tablet Take 20 mg by mouth at bedtime.      Marland Kitchen umeclidinium-vilanterol (ANORO ELLIPTA) 62.5-25 MCG/INH AEPB Inhale 1 puff into the lungs in the morning and at bedtime.     . Amino Acids-Protein Hydrolys (FEEDING SUPPLEMENT, PRO-STAT SUGAR FREE 64,) LIQD Take 30 mLs by mouth 2 (two) times daily. 887 mL 0  . ferrous sulfate 325 (65 FE) MG tablet Take 1 tablet (325 mg total) by mouth 2 (two) times daily with a meal. 60 tablet 3  . Multiple Vitamin (MULTIVITAMIN WITH MINERALS) TABS tablet Take 1 tablet by mouth daily.     No facility-administered medications prior to visit.   Review of Systems  Review of Systems  Respiratory: Positive for cough and shortness of breath.    Physical Exam  BP 120/70 (BP Location: Left Arm, Patient Position: Sitting, Cuff Size: Normal)   Pulse (!) 54   Ht 5\' 10"  (1.778 m)   Wt 148 lb (67.1 kg)   SpO2 95% Comment: RA  BMI 21.24 kg/m  Physical Exam Constitutional:      Appearance: Normal appearance.  Cardiovascular:     Rate and Rhythm: Normal rate and regular rhythm.  Neurological:     General: No focal deficit present.     Mental Status: He is alert and oriented to person, place, and time. Mental status is at baseline.  Psychiatric:        Mood and Affect: Mood normal.        Behavior: Behavior normal.        Thought Content: Thought content normal.        Judgment: Judgment normal.      Lab Results:  CBC    Component Value Date/Time   WBC 6.6 12/16/2019 1515   RBC 4.02 (L) 12/16/2019 1515   HGB 11.8 (L) 12/16/2019 1515   HGB 12.1 (L) 09/27/2017 1507   HCT 34.5 (L) 12/16/2019 1515   HCT 36.7 (L) 09/27/2017 1507   PLT 158.0 12/16/2019 1515   PLT 173 09/27/2017 1507   MCV 85.9 12/16/2019 1515    MCV 91 09/27/2017 1507   MCH 28.7 04/01/2019 1450   MCHC 34.1 12/16/2019 1515   RDW 14.0 12/16/2019 1515   RDW 14.0 09/27/2017 1507   LYMPHSABS 1.6 12/16/2019 1515   LYMPHSABS 1.7 09/27/2017 1507   MONOABS 0.4 12/16/2019 1515   EOSABS 0.2 12/16/2019 1515   EOSABS 0.1 09/27/2017 1507   BASOSABS 0.0 12/16/2019 1515  BASOSABS 0.0 09/27/2017 1507    BMET    Component Value Date/Time   NA 139 12/16/2019 1515   NA 139 09/27/2017 1507   K 4.3 12/16/2019 1515   CL 99 12/16/2019 1515   CO2 36 (H) 12/16/2019 1515   GLUCOSE 173 (H) 12/16/2019 1515   BUN 29 (H) 12/16/2019 1515   BUN 23 09/27/2017 1507   CREATININE 1.60 (H) 12/16/2019 1515   CALCIUM 8.8 12/16/2019 1515   GFRNONAA 40 (L) 04/01/2019 0836   GFRAA 46 (L) 04/01/2019 0836    BNP No results found for: BNP  ProBNP    Component Value Date/Time   PROBNP 147.0 (H) 12/16/2019 1515    Imaging: DG Chest 2 View  Result Date: 12/16/2019 CLINICAL DATA:  Worsening shortness of breath. EXAM: CHEST - 2 VIEW COMPARISON:  03/30/2019 FINDINGS: The heart size and mediastinal contours are within normal limits. Both lungs are clear. The visualized skeletal structures are unremarkable. IMPRESSION: No active cardiopulmonary disease. Electronically Signed   By: Marlaine Hind M.D.   On: 12/16/2019 14:05     Assessment & Plan:   COPD, severe ([place]) - Increased shortness of breath over the last year and a half. Started smoking again, he has a 40 + pack year hx. CXR today showed clear lungs, no acute process. Plan change Anoro to Trelegy 200.   Plan: - Stop Anoro and Qvar  - Start trial Trelegy 200- take one puff daily in the morning (rinse mouth after use) - Continue Albuterol 2 puffs OR nebulizer solution every 6 hours as needed for breakthrough shortness of breath or wheezing  - DME referral for nebulizer    Chronic respiratory failure with hypoxia (H[facility]) - Continue 4L oxygen continuously and at night  Central sleep apnea -  Intolerant to BIPAP, previously following with [facility] neurology  - He is open to trying PAP therapy again, will likely need titration study - Refer to [facility] pulmonary sleep medicine   Current smoker - Patient is current smoker, quit in Feb 2020 but restarted. He has a 40+ pack year hx. He has lost 20 lbs since March 2021. Denies hemoptysis. Refer to lung cancer screening program.   Chronic pain - Following with pain management  FU in 4 weeks   Martyn Ehrich, NP 12/17/2019

## 2019-12-16 ENCOUNTER — Ambulatory Visit (INDEPENDENT_AMBULATORY_CARE_PROVIDER_SITE_OTHER): Payer: Medicare Other | Admitting: Primary Care

## 2019-12-16 ENCOUNTER — Other Ambulatory Visit: Payer: Self-pay

## 2019-12-16 ENCOUNTER — Ambulatory Visit (INDEPENDENT_AMBULATORY_CARE_PROVIDER_SITE_OTHER): Payer: Medicare Other

## 2019-12-16 ENCOUNTER — Encounter: Payer: Self-pay | Admitting: Primary Care

## 2019-12-16 VITALS — BP 120/70 | HR 54 | Ht 70.0 in | Wt 148.0 lb

## 2019-12-16 DIAGNOSIS — G4731 Primary central sleep apnea: Secondary | ICD-10-CM

## 2019-12-16 DIAGNOSIS — J9611 Chronic respiratory failure with hypoxia: Secondary | ICD-10-CM

## 2019-12-16 DIAGNOSIS — R0602 Shortness of breath: Secondary | ICD-10-CM

## 2019-12-16 DIAGNOSIS — J449 Chronic obstructive pulmonary disease, unspecified: Secondary | ICD-10-CM

## 2019-12-16 DIAGNOSIS — G8929 Other chronic pain: Secondary | ICD-10-CM

## 2019-12-16 DIAGNOSIS — F17200 Nicotine dependence, unspecified, uncomplicated: Secondary | ICD-10-CM | POA: Diagnosis not present

## 2019-12-16 LAB — BASIC METABOLIC PANEL
BUN: 29 mg/dL — ABNORMAL HIGH (ref 6–23)
CO2: 36 mEq/L — ABNORMAL HIGH (ref 19–32)
Calcium: 8.8 mg/dL (ref 8.4–10.5)
Chloride: 99 mEq/L (ref 96–112)
Creatinine, Ser: 1.6 mg/dL — ABNORMAL HIGH (ref 0.40–1.50)
GFR: 44.81 mL/min — ABNORMAL LOW (ref >60.00)
Glucose, Bld: 173 mg/dL — ABNORMAL HIGH (ref 70–99)
Potassium: 4.3 mEq/L (ref 3.5–5.1)
Sodium: 139 mEq/L (ref 135–145)

## 2019-12-16 LAB — CBC WITH DIFFERENTIAL/PLATELET
Basophils Absolute: 0 10*3/uL (ref 0.0–0.1)
Basophils Relative: 0.5 % (ref 0.0–3.0)
Eosinophils Absolute: 0.2 10*3/uL (ref 0.0–0.7)
Eosinophils Relative: 2.7 % (ref 0.0–5.0)
HCT: 34.5 % — ABNORMAL LOW (ref 39.0–52.0)
Hemoglobin: 11.8 g/dL — ABNORMAL LOW (ref 13.0–17.0)
Lymphocytes Relative: 24.3 % (ref 12.0–46.0)
Lymphs Abs: 1.6 10*3/uL (ref 0.7–4.0)
MCHC: 34.1 g/dL (ref 30.0–36.0)
MCV: 85.9 fl (ref 78.0–100.0)
Monocytes Absolute: 0.4 10*3/uL (ref 0.1–1.0)
Monocytes Relative: 6.1 % (ref 3.0–12.0)
Neutro Abs: 4.4 10*3/uL (ref 1.4–7.7)
Neutrophils Relative %: 66.4 % (ref 43.0–77.0)
Platelets: 158 10*3/uL (ref 150.0–400.0)
RBC: 4.02 Mil/uL — ABNORMAL LOW (ref 4.22–5.81)
RDW: 14 % (ref 11.5–15.5)
WBC: 6.6 10*3/uL (ref 4.0–10.5)

## 2019-12-16 LAB — BRAIN NATRIURETIC PEPTIDE: Pro B Natriuretic peptide (BNP): 147 pg/mL — ABNORMAL HIGH (ref 0.0–100.0)

## 2019-12-16 MED ORDER — TRELEGY ELLIPTA 200-62.5-25 MCG/INH IN AEPB: 1.0000 | INHALATION_SPRAY | Freq: Every day | RESPIRATORY_TRACT | 0 refills | Status: DC

## 2019-12-16 MED ORDER — ALBUTEROL SULFATE (2.5 MG/3ML) 0.083% IN NEBU: 2.5000 mg | INHALATION_SOLUTION | Freq: Four times a day (QID) | RESPIRATORY_TRACT | 5 refills | Status: DC | PRN

## 2019-12-16 MED ORDER — ALBUTEROL SULFATE (2.5 MG/3ML) 0.083% IN NEBU: 2.5000 mg | INHALATION_SOLUTION | Freq: Four times a day (QID) | RESPIRATORY_TRACT | 5 refills | Status: AC | PRN

## 2019-12-16 NOTE — Patient Instructions (Addendum)
CXR today looked normal, lungs were clear. No active cardiopulmonary disease.   COPD: - Stop Anoro and Qvar  - Start trial Trelegy 200- take one puff daily in the morning (rinse mouth after use) - Continue Albuterol 2 puffs OR nebulizer solution every 6 hours as needed for breakthrough shortness of breath or wheezing   Chronic respiratory failure: - Continue Oxygen at ___4_____ L/min on exertion  - Recommend wearing oxygen at night as well   Orders: - Labs today (ordered) - New nebulizer machine re: COPD (ordered)  Referral: - Refer to lung cancer screening program Re: current smoker, >30 pack year hx   Follow-up: - 2-4 weeks with Dr. Vaughan Browner or his first available  - Refer to [facility] pulmonary sleep specialist for consult sleep apnea (Dr. Halford Chessman, Sanjuana Kava)   COPD and Physical Activity Chronic obstructive pulmonary disease (COPD) is a long-term (chronic) condition that affects the lungs. COPD is a general term that can be used to describe many different lung problems that cause lung swelling (inflammation) and limit airflow, including chronic bronchitis and emphysema. The main symptom of COPD is shortness of breath, which makes it harder to do even simple tasks. This can also make it harder to exercise and be active. Talk with your health care provider about treatments to help you breathe better and actions you can take to prevent breathing problems during physical activity. What are the benefits of exercising with COPD? Exercising regularly is an important part of a healthy lifestyle. You can still exercise and do physical activities even though you have COPD. Exercise and physical activity improve your shortness of breath by increasing blood flow (circulation). This causes your heart to pump more oxygen through your body. Moderate exercise can improve your:  Oxygen use.  Energy level.  Shortness of breath.  Strength in your breathing muscles.  Heart  health.  Sleep.  Self-esteem and feelings of self-worth.  Depression, stress, and anxiety levels. Exercise can benefit everyone with COPD. The severity of your disease may affect how hard you can exercise, especially at first, but everyone can benefit. Talk with your health care provider about how much exercise is safe for you, and which activities and exercises are safe for you. What actions can I take to prevent breathing problems during physical activity?  Sign up for a pulmonary rehabilitation program. This type of program may include: ? Education about lung diseases. ? Exercise classes that teach you how to exercise and be more active while improving your breathing. This usually involves:  Exercise using your lower extremities, such as a stationary bicycle.  About 30 minutes of exercise, 2 to 5 times per week, for 6 to 12 weeks  Strength training, such as push ups or leg lifts. ? Nutrition education. ? Group classes in which you can talk with others who also have COPD and learn ways to manage stress.  If you use an oxygen tank, you should use it while you exercise. Work with your health care provider to adjust your oxygen for your physical activity. Your resting flow rate is different from your flow rate during physical activity.  While you are exercising: ? Take slow breaths. ? Pace yourself and do not try to go too fast. ? Purse your lips while breathing out. Pursing your lips is similar to a kissing or whistling position. ? If doing exercise that uses a quick burst of effort, such as weight lifting:  Breathe in before starting the exercise.  Breathe out during the  hardest part of the exercise (such as raising the weights). Where to find support You can find support for exercising with COPD from:  Your health care provider.  A pulmonary rehabilitation program.  Your local health department or community health programs.  Support groups, online or in-person. Your health  care provider may be able to recommend support groups. Where to find more information You can find more information about exercising with COPD from:  American Lung Association: ClassInsider.se.  COPD Foundation: https://www.rivera.net/. Contact a health care provider if:  Your symptoms get worse.  You have chest pain.  You have nausea.  You have a fever.  You have trouble talking or catching your breath.  You want to start a new exercise program or a new activity. Summary  COPD is a general term that can be used to describe many different lung problems that cause lung swelling (inflammation) and limit airflow. This includes chronic bronchitis and emphysema.  Exercise and physical activity improve your shortness of breath by increasing blood flow (circulation). This causes your heart to provide more oxygen to your body.  Contact your health care provider before starting any exercise program or new activity. Ask your health care provider what exercises and activities are safe for you. This information is not intended to replace advice given to you by your health care provider. Make sure you discuss any questions you have with your health care provider. Document Revised: 04/24/2018 Document Reviewed: 01/25/2017 Elsevier Patient Education  [place].   Sleep Apnea Sleep apnea affects breathing during sleep. It causes breathing to stop for a short time or to become shallow. It can also increase the risk of:  Heart attack.  Stroke.  Being very overweight (obese).  Diabetes.  Heart failure.  Irregular heartbeat. The goal of treatment is to help you breathe normally again. What are the causes? There are three kinds of sleep apnea:  Obstructive sleep apnea. This is caused by a blocked or collapsed airway.  Central sleep apnea. This happens when the brain does not send the right signals to the muscles that control breathing.  Mixed sleep apnea. This is a combination of  obstructive and central sleep apnea. The most common cause of this condition is a collapsed or blocked airway. This can happen if:  Your throat muscles are too relaxed.  Your tongue and tonsils are too large.  You are overweight.  Your airway is too small. What increases the risk?  Being overweight.  Smoking.  Having a small airway.  Being older.  Being male.  Drinking alcohol.  Taking medicines to calm yourself (sedatives or tranquilizers).  Having family members with the condition. What are the signs or symptoms?  Trouble staying asleep.  Being sleepy or tired during the day.  Getting angry a lot.  Loud snoring.  Headaches in the morning.  Not being able to focus your mind (concentrate).  Forgetting things.  Less interest in sex.  Mood swings.  Personality changes.  Feelings of sadness (depression).  Waking up a lot during the night to pee (urinate).  Dry mouth.  Sore throat. How is this diagnosed?  Your medical history.  A physical exam.  A test that is done when you are sleeping (sleep study). The test is most often done in a sleep lab but may also be done at home. How is this treated?   Sleeping on your side.  Using a medicine to get rid of mucus in your nose (decongestant).  Avoiding the  use of alcohol, medicines to help you relax, or certain pain medicines (narcotics).  Losing weight, if needed.  Changing your diet.  Not smoking.  Using a machine to open your airway while you sleep, such as: ? An oral appliance. This is a mouthpiece that shifts your lower jaw forward. ? A CPAP device. This device blows air through a mask when you breathe out (exhale). ? An EPAP device. This has valves that you put in each nostril. ? A BPAP device. This device blows air through a mask when you breathe in (inhale) and breathe out.  Having surgery if other treatments do not work. It is important to get treatment for sleep apnea. Without  treatment, it can lead to:  High blood pressure.  Coronary artery disease.  In men, not being able to have an erection (impotence).  Reduced thinking ability. Follow these instructions at home: Lifestyle  Make changes that your doctor recommends.  Eat a healthy diet.  Lose weight if needed.  Avoid alcohol, medicines to help you relax, and some pain medicines.  Do not use any products that contain nicotine or tobacco, such as cigarettes, e-cigarettes, and chewing tobacco. If you need help quitting, ask your doctor. General instructions  Take over-the-counter and prescription medicines only as told by your doctor.  If you were given a machine to use while you sleep, use it only as told by your doctor.  If you are having surgery, make sure to tell your doctor you have sleep apnea. You may need to bring your device with you.  Keep all follow-up visits as told by your doctor. This is important. Contact a doctor if:  The machine that you were given to use during sleep bothers you or does not seem to be working.  You do not get better.  You get worse. Get help right away if:  Your chest hurts.  You have trouble breathing in enough air.  You have an uncomfortable feeling in your back, arms, or stomach.  You have trouble talking.  One side of your body feels weak.  A part of your face is hanging down. These symptoms may be an emergency. Do not wait to see if the symptoms will go away. Get medical help right away. Call your local emergency services (911 in the [place] Do not drive yourself to the hospital. Summary  This condition affects breathing during sleep.  The most common cause is a collapsed or blocked airway.  The goal of treatment is to help you breathe normally while you sleep. This information is not intended to replace advice given to you by your health care provider. Make sure you discuss any questions you have with your health care provider. Document  Revised: 10/19/2017 Document Reviewed: 08/28/2017 Elsevier Patient Education  [place].

## 2019-12-17 ENCOUNTER — Encounter: Payer: Self-pay | Admitting: Family

## 2019-12-17 ENCOUNTER — Encounter: Payer: Self-pay | Admitting: Primary Care

## 2019-12-17 ENCOUNTER — Ambulatory Visit (INDEPENDENT_AMBULATORY_CARE_PROVIDER_SITE_OTHER): Payer: Medicare Other | Admitting: Family

## 2019-12-17 DIAGNOSIS — I872 Venous insufficiency (chronic) (peripheral): Secondary | ICD-10-CM | POA: Diagnosis not present

## 2019-12-17 DIAGNOSIS — M722 Plantar fascial fibromatosis: Secondary | ICD-10-CM | POA: Diagnosis not present

## 2019-12-17 DIAGNOSIS — L97521 Non-pressure chronic ulcer of other part of left foot limited to breakdown of skin: Secondary | ICD-10-CM

## 2019-12-17 DIAGNOSIS — F17200 Nicotine dependence, unspecified, uncomplicated: Secondary | ICD-10-CM | POA: Insufficient documentation

## 2019-12-17 DIAGNOSIS — J449 Chronic obstructive pulmonary disease, unspecified: Secondary | ICD-10-CM | POA: Insufficient documentation

## 2019-12-17 NOTE — Assessment & Plan Note (Addendum)
-   Increased shortness of breath over the last year and a half. Started smoking again, he has a 40 + pack year hx. CXR today showed clear lungs, no acute process. Plan change Anoro to Trelegy 200.   Plan: - Stop Anoro and Qvar  - Start trial Trelegy 200- take one puff daily in the morning (rinse mouth after use) - Continue Albuterol 2 puffs OR nebulizer solution every 6 hours as needed for breakthrough shortness of breath or wheezing  - DME referral for nebulizer

## 2019-12-17 NOTE — Assessment & Plan Note (Signed)
Following with pain management

## 2019-12-17 NOTE — Assessment & Plan Note (Signed)
-   Patient is current smoker, quit in Feb 2020 but restarted. He has a 40+ pack year hx. He has lost 20 lbs since March 2021. Denies hemoptysis. Refer to lung cancer screening program.

## 2019-12-17 NOTE — Progress Notes (Signed)
Please let patient/family know fluid level was slightly elevated, recommend getting echocardiogram. Kidney function is baseline. Co2 was elevated this could be from using more oxygen than he needed but also from sleep apnea. Recommend using 4L oxygen   Please order echo re: sob

## 2019-12-17 NOTE — Assessment & Plan Note (Signed)
-   Continue 4L oxygen continuously and at night

## 2019-12-17 NOTE — Assessment & Plan Note (Signed)
-   Intolerant to BIPAP, previously following with [facility] neurology  - He is open to trying PAP therapy again, will likely need titration study - Refer to [facility] pulmonary sleep medicine
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
## 2019-12-18 ENCOUNTER — Other Ambulatory Visit: Payer: Self-pay | Admitting: *Deleted

## 2019-12-18 DIAGNOSIS — R0602 Shortness of breath: Secondary | ICD-10-CM

## 2019-12-22 ENCOUNTER — Telehealth: Payer: Self-pay | Admitting: Pulmonary Disease

## 2019-12-22 NOTE — Telephone Encounter (Signed)
I have attempted to call the pts wife back but she has a VM that is not set up.   Looks like [employer] was sent the order for the nebulizer machine and they did receive it.  Will try to call the pts wife back

## 2019-12-24 ENCOUNTER — Ambulatory Visit (HOSPITAL_COMMUNITY)
Admission: RE | Admit: 2019-12-24 | Discharge: 2019-12-24 | Disposition: A | Discharge status: Home / Self Care | Payer: Medicare Other | Source: Ambulatory Visit | Attending: Primary Care | Admitting: Primary Care

## 2019-12-24 ENCOUNTER — Other Ambulatory Visit: Payer: Self-pay

## 2019-12-24 DIAGNOSIS — E785 Hyperlipidemia, unspecified: Secondary | ICD-10-CM | POA: Insufficient documentation

## 2019-12-24 DIAGNOSIS — R0602 Shortness of breath: Secondary | ICD-10-CM | POA: Diagnosis not present

## 2019-12-24 DIAGNOSIS — I10 Essential (primary) hypertension: Secondary | ICD-10-CM | POA: Diagnosis not present

## 2019-12-24 DIAGNOSIS — R4182 Altered mental status, unspecified: Secondary | ICD-10-CM | POA: Insufficient documentation

## 2019-12-24 DIAGNOSIS — F17200 Nicotine dependence, unspecified, uncomplicated: Secondary | ICD-10-CM | POA: Insufficient documentation

## 2019-12-24 DIAGNOSIS — E119 Type 2 diabetes mellitus without complications: Secondary | ICD-10-CM | POA: Diagnosis not present

## 2019-12-24 LAB — ECHOCARDIOGRAM COMPLETE
Area-P 1/2: 2.48 cm2
Calc EF: 65.6 %
S' Lateral: 2.6 cm
Single Plane A2C EF: 66.2 %
Single Plane A4C EF: 66.7 %

## 2019-12-24 NOTE — Progress Notes (Signed)
  Echocardiogram 2D Echocardiogram has been performed.  Bobbye Charleston 12/24/2019, 2:50 PM

## 2019-12-30 ENCOUNTER — Telehealth: Payer: Self-pay | Admitting: Adult Health

## 2019-12-30 ENCOUNTER — Ambulatory Visit: Payer: Medicare Other | Admitting: Pulmonary Disease

## 2019-12-30 NOTE — Telephone Encounter (Signed)
Patient called in and says he has never received his nebulizer machine can we please call [facility] to look into this.

## 2019-12-31 NOTE — Telephone Encounter (Signed)
Cm sent to [facility] asking what is going on and why the patient doesn't have neb machine yet

## 2019-12-31 NOTE — Telephone Encounter (Signed)
Nebulizer was ordered on 12/16/19. PCCs please advise on status of order.  Thanks!

## 2019-12-31 NOTE — Telephone Encounter (Signed)
Rec'd message from [place] with [employer]  order has been created and is ready. i called to see if he wanted to pickup in our [facility] store or have it shipped, had to leave a message. if he should call you back, if you don't mind to check with him to see which he prefers, that would be much appreciated. thanks. I called the number [phone] and it states voice mailbox has not been setup yet. I then called [phone] and spoke with the wife she stated that she had spoke with 3 different departments and they didn't have this order.  She is now going to call [employer]

## 2020-01-05 NOTE — Telephone Encounter (Signed)
I just spoke with [name] and she states that [facility] finally got his Neb machine nothing further needed at this time

## 2020-01-06 DIAGNOSIS — M19272 Secondary osteoarthritis, left ankle and foot: Secondary | ICD-10-CM | POA: Diagnosis not present

## 2020-01-06 DIAGNOSIS — E1140 Type 2 diabetes mellitus with diabetic neuropathy, unspecified: Secondary | ICD-10-CM | POA: Diagnosis not present

## 2020-01-06 DIAGNOSIS — M25572 Pain in left ankle and joints of left foot: Secondary | ICD-10-CM | POA: Diagnosis not present

## 2020-01-06 DIAGNOSIS — G894 Chronic pain syndrome: Secondary | ICD-10-CM | POA: Diagnosis not present

## 2020-01-14 ENCOUNTER — Encounter: Payer: Self-pay | Admitting: Physician Assistant

## 2020-01-14 ENCOUNTER — Ambulatory Visit (INDEPENDENT_AMBULATORY_CARE_PROVIDER_SITE_OTHER): Payer: Medicare Other | Admitting: Physician Assistant

## 2020-01-14 DIAGNOSIS — T148XXA Other injury of unspecified body region, initial encounter: Secondary | ICD-10-CM
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
## 2020-01-20 ENCOUNTER — Other Ambulatory Visit: Payer: Self-pay

## 2020-01-20 ENCOUNTER — Ambulatory Visit (INDEPENDENT_AMBULATORY_CARE_PROVIDER_SITE_OTHER): Payer: Medicare Other | Admitting: Pulmonary Disease

## 2020-01-20 ENCOUNTER — Other Ambulatory Visit: Payer: Self-pay | Admitting: *Deleted

## 2020-01-20 ENCOUNTER — Encounter: Payer: Self-pay | Admitting: Pulmonary Disease

## 2020-01-20 DIAGNOSIS — F17210 Nicotine dependence, cigarettes, uncomplicated: Secondary | ICD-10-CM

## 2020-01-20 DIAGNOSIS — G4731 Primary central sleep apnea: Secondary | ICD-10-CM | POA: Diagnosis not present

## 2020-01-20 DIAGNOSIS — J449 Chronic obstructive pulmonary disease, unspecified: Secondary | ICD-10-CM | POA: Diagnosis not present

## 2020-01-20 DIAGNOSIS — J9611 Chronic respiratory failure with hypoxia: Secondary | ICD-10-CM

## 2020-01-20 DIAGNOSIS — Z87891 Personal history of nicotine dependence: Secondary | ICD-10-CM

## 2020-01-20 MED ORDER — TRELEGY ELLIPTA 200-62.5-25 MCG/INH IN AEPB
1.0000 | INHALATION_SPRAY | Freq: Every day | RESPIRATORY_TRACT | 11 refills | Status: DC
Start: 2020-01-20 — End: 2020-03-15

## 2020-01-20 MED ORDER — TRELEGY ELLIPTA 100-62.5-25 MCG/INH IN AEPB: 1.0000 | INHALATION_SPRAY | Freq: Every day | RESPIRATORY_TRACT | 11 refills | Status: AC
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
## 2020-01-20 NOTE — Assessment & Plan Note (Addendum)
I discussed PFTs with him in detail. Prescription will be provided for Trelegy, he was advised to use albuterol nebs as needed for breakthrough shortness of breath or wheezing  Further follow-up can be with Dr. Isaiah Serge

## 2020-01-20 NOTE — Assessment & Plan Note (Signed)
Oxygen saturation 90% at rest today.  I have asked him to use oxygen every night during sleep and on ambulation

## 2020-01-20 NOTE — Assessment & Plan Note (Signed)
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  I emphasized to him that the main treatment for central sleep apnea would be to decrease his dose of methadone.  He is working with his pain physician, he has recently decreased from 40 mg to 30 mg of methadone and is willing to try to decrease further.  I asked him to contact us again once he is down to the lowest dose and we can repeat a sleep study to see if central apneas have decreased.  He is not interested in another lab study so we may have to do a home sleep test even though this would be suboptimal.  Meantime, he is not interested in another trial of BiPAP, if he wanted to correct this he would likely need ASV and we could trial nasal mask with a chinstrap but he is really not interested in pursuing this any further. So although suboptimal, he will continue to use his oxygen I once again emphasized to him the need to use oxygen every night

## 2020-01-20 NOTE — Patient Instructions (Signed)
  Prescription for Trelegy will be sent to pharmacy. You have to quit smoking -lung function is only at 40%   You have central sleep apnea This is related to dose of methadone, discussed with your pain medicine clinic about decreasing methadone. Once you're at the lowest level call us and we can schedule home sleep test to see if central apneas have decreased

## 2020-01-26 DIAGNOSIS — Z23 Encounter for immunization: Secondary | ICD-10-CM | POA: Diagnosis not present

## 2020-01-30 ENCOUNTER — Ambulatory Visit: Payer: Medicare Other | Admitting: Pulmonary Disease

## 2020-02-03 NOTE — Progress Notes (Signed)
Thank you for the update - I hope this will be the help he needs. CD

## 2020-02-04 ENCOUNTER — Telehealth: Payer: Self-pay | Admitting: Primary Care

## 2020-02-06 NOTE — Telephone Encounter (Signed)
Unclear why [employer] was calling. After 5:00, will wait to call back until 02/09/20.

## 2020-02-09 ENCOUNTER — Telehealth: Payer: Self-pay | Admitting: Pulmonary Disease

## 2020-02-09 NOTE — Telephone Encounter (Signed)
Note was faxed to the number requested despite [employer] having access to [employer].

## 2020-02-12 ENCOUNTER — Telehealth: Payer: Self-pay | Admitting: Acute Care

## 2020-02-12 NOTE — Telephone Encounter (Signed)
Called [employer] and waited on hold for 10 min with no one to speak with. WCB.

## 2020-03-01 ENCOUNTER — Other Ambulatory Visit: Payer: Self-pay

## 2020-03-01 ENCOUNTER — Encounter: Payer: Self-pay | Admitting: Acute Care

## 2020-03-01 ENCOUNTER — Ambulatory Visit (INDEPENDENT_AMBULATORY_CARE_PROVIDER_SITE_OTHER): Payer: Medicare Other | Admitting: Acute Care

## 2020-03-01 ENCOUNTER — Ambulatory Visit
Admission: RE | Admit: 2020-03-01 | Discharge: 2020-03-01 | Disposition: A | Discharge status: Home / Self Care | Payer: Medicare Other | Source: Ambulatory Visit | Attending: Acute Care | Admitting: Acute Care

## 2020-03-01 VITALS — BP 118/72 | HR 56 | Temp 97.3°F | Ht 68.5 in | Wt 147.4 lb

## 2020-03-01 DIAGNOSIS — E278 Other specified disorders of adrenal gland: Secondary | ICD-10-CM | POA: Diagnosis not present

## 2020-03-01 DIAGNOSIS — I2510 Atherosclerotic heart disease of native coronary artery without angina pectoris: Secondary | ICD-10-CM | POA: Diagnosis not present

## 2020-03-01 DIAGNOSIS — Z87891 Personal history of nicotine dependence: Secondary | ICD-10-CM

## 2020-03-01 DIAGNOSIS — J449 Chronic obstructive pulmonary disease, unspecified: Secondary | ICD-10-CM

## 2020-03-01 DIAGNOSIS — Z5320 Procedure and treatment not carried out because of patient's decision for unspecified reasons: Secondary | ICD-10-CM

## 2020-03-01 DIAGNOSIS — N62 Hypertrophy of breast: Secondary | ICD-10-CM | POA: Diagnosis not present

## 2020-03-01 DIAGNOSIS — J9611 Chronic respiratory failure with hypoxia: Secondary | ICD-10-CM

## 2020-03-01 DIAGNOSIS — F17210 Nicotine dependence, cigarettes, uncomplicated: Secondary | ICD-10-CM

## 2020-03-01 NOTE — Patient Instructions (Signed)
Thank you for participating in the [facility]. It was our pleasure to meet you today. We will call you with the results of your scan within the next few days. Your scan will be assigned a Lung RADS category score by the physicians reading the scans.  This Lung RADS score determines follow up scanning.  See below for description of categories, and follow up screening recommendations. We will be in touch to schedule your follow up screening annually or based on recommendations of our providers. We will fax a copy of your scan results to your Primary Care Physician, or the physician who referred you to the program, to ensure they have the results. Please call the office if you have any questions or concerns regarding your scanning experience or results.  Our office number is [phone]. Please speak with Denise Phelps, RN. She is our Lung Cancer Screening RN. If she is unavailable when you call, please have the office staff send her a message. She will return your call at her earliest convenience. Remember, if your scan is normal, we will scan you annually as long as you continue to meet the criteria for the program. (Age 55-77, Current smoker or smoker who has quit within the last 15 years). If you are a smoker, remember, quitting is the single most powerful action that you can take to decrease your risk of lung cancer and other pulmonary, breathing related problems. We know quitting is hard, and we are here to help.  Please let us know if there is anything we can do to help you meet your goal of quitting. If you are a former smoker, congratulations. We are proud of you! Remain smoke free! Remember you can refer friends or family members through the number above.  We will screen them to make sure they meet criteria for the program. Thank you for helping us take better care of you by participating in Lung Screening.  Lung RADS Categories:  Lung RADS 1: no nodules  or definitely non-concerning nodules.  Recommendation is for a repeat annual scan in 12 months.  Lung RADS 2:  nodules that are non-concerning in appearance and behavior with a very low likelihood of becoming an active cancer. Recommendation is for a repeat annual scan in 12 months.  Lung RADS 3: nodules that are probably non-concerning , includes nodules with a low likelihood of becoming an active cancer.  Recommendation is for a 6-month repeat screening scan. Often noted after an upper respiratory illness. We will be in touch to make sure you have no questions, and to schedule your 6-month scan.  Lung RADS 4 A: nodules with concerning findings, recommendation is most often for a follow up scan in 3 months or additional testing based on our provider's assessment of the scan. We will be in touch to make sure you have no questions and to schedule the recommended 3 month follow up scan.  Lung RADS 4 B:  indicates findings that are concerning. We will be in touch with you to schedule additional diagnostic testing based on our provider's  assessment of the scan.   

## 2020-03-01 NOTE — Progress Notes (Signed)
Shared Decision Making Visit Lung Cancer Screening Program [phone])   Eligibility:  Age 65 y.o.  Pack Years Smoking History Calculation 50 pack year smoking history (# packs/per year x # years smoked)  Recent History of coughing up blood  no  Unexplained weight loss? no ( >Than 15 pounds within the last 6 months )  Prior History Lung / other cancer no (Diagnosis within the last 5 years already requiring surveillance chest CT Scans).  Smoking Status Current Smoker  Former Smokers: Years since quit:NA  Quit Date: NA  Visit Components:  Discussion included one or more decision making aids. yes  Discussion included risk/benefits of screening. yes  Discussion included potential follow up diagnostic testing for abnormal scans. yes  Discussion included meaning and risk of over diagnosis. yes  Discussion included meaning and risk of False Positives. yes  Discussion included meaning of total radiation exposure. yes  Counseling Included:  Importance of adherence to annual lung cancer LDCT screening. yes  Impact of comorbidities on ability to participate in the program. yes  Ability and willingness to under diagnostic treatment. yes  Smoking Cessation Counseling:  Current Smokers:   Discussed importance of smoking cessation. yes  Information about tobacco cessation classes and interventions provided to patient. yes  Patient provided with "ticket" for LDCT Scan. yes  Symptomatic Patient. no  Counseling NA  Diagnosis Code: Tobacco Use Z72.0  Asymptomatic Patient yes  Counseling (Intermediate counseling: > three minutes counseling) [id number]  Former Smokers:   Discussed the importance of maintaining cigarette abstinence. yes  Diagnosis Code: Personal History of Nicotine Dependence. S34.196  Information about tobacco cessation classes and interventions provided to patient. Yes  Patient provided with "ticket" for LDCT Scan. yes  Written Order for Lung Cancer  Screening with LDCT placed in Epic. Yes (CT Chest Lung Cancer Screening Low Dose W/O CM) [id number] Z12.2-Screening of respiratory organs Z87.891-Personal history of nicotine dependence  I have spent 25 minutes of face to face time with [name] discussing the risks and benefits of lung cancer screening. We viewed a power point together that explained in detail the above noted topics. We paused at intervals to allow for questions to be asked and answered to ensure understanding.We discussed that the single most powerful action that he can take to decrease his risk of developing lung cancer is to quit smoking. We discussed whether or not he is ready to commit to setting a quit date. We discussed options for tools to aid in quitting smoking including nicotine replacement therapy, non-nicotine medications, support groups, Quit Smart classes, and behavior modification. We discussed that often times setting smaller, more achievable goals, such as eliminating 1 cigarette a day for a week and then 2 cigarettes a day for a week can be helpful in slowly decreasing the number of cigarettes smoked. This allows for a sense of accomplishment as well as providing a clinical benefit. I gave him the " Be Stronger Than Your Excuses" card with contact information for community resources, classes, free nicotine replacement therapy, and access to mobile apps, text messaging, and on-line smoking cessation help. I have also given him my card and contact information in the event he needs to contact me. We discussed the time and location of the scan, and that either Doroteo Glassman RN or I will call with the results within 24-48 hours of receiving them. I have offered him  a copy of the power point we viewed  as a resource in the event they need reinforcement of  the concepts we discussed today in the office. The patient verbalized understanding of all of  the above and had no further questions upon leaving the office. They have my contact  information in the event they have any further questions.  I spent 3 minutes counseling on smoking cessation and the health risks of continued tobacco abuse.  I explained to the patient that there has been a high incidence of coronary artery disease noted on these exams. I explained that this is a non-gated exam therefore degree or severity cannot be determined. This patient is currently on statin therapy. I have asked the patient to follow-up with their PCP regarding any incidental finding of coronary artery disease and management with diet or medication as their PCP  feels is clinically indicated. The patient verbalized understanding of the above and had no further questions upon completion of the visit.      Magdalen Spatz, NP 03/01/2020 2:38 PM

## 2020-03-02 ENCOUNTER — Telehealth: Payer: Self-pay | Admitting: Acute Care

## 2020-03-02 DIAGNOSIS — E1140 Type 2 diabetes mellitus with diabetic neuropathy, unspecified: Secondary | ICD-10-CM | POA: Diagnosis not present

## 2020-03-02 DIAGNOSIS — M19272 Secondary osteoarthritis, left ankle and foot: Secondary | ICD-10-CM | POA: Diagnosis not present

## 2020-03-02 DIAGNOSIS — M25572 Pain in left ankle and joints of left foot: Secondary | ICD-10-CM | POA: Diagnosis not present

## 2020-03-02 DIAGNOSIS — Z79891 Long term (current) use of opiate analgesic: Secondary | ICD-10-CM | POA: Diagnosis not present

## 2020-03-02 DIAGNOSIS — G894 Chronic pain syndrome: Secondary | ICD-10-CM | POA: Diagnosis not present

## 2020-03-02 NOTE — Telephone Encounter (Signed)
Called and spoke with Beverlee Nims at [facility].  Will forward the message to SG to make her aware of Lung cancer screening CT call report.  thanks

## 2020-03-02 NOTE — Progress Notes (Signed)
I have called the patient with the results of his low dose CT Chest. He was not available, but I spoke with his wife [name], who said she will pass the results on to the patient. I explained that there is a nodule that we need to re scan in 3 months to ensure it is stable, and has not grown in the time interval. She verbalized understanding. I explained that they will get a call closer to the time to get this scheduled. She understands. Langley Gauss, please fax results to PCP and let them know we will schedule the follow up Ct. Please order 3 month follow up scan.  Thanks so much

## 2020-03-04 ENCOUNTER — Telehealth: Payer: Self-pay | Admitting: Pulmonary Disease

## 2020-03-04 ENCOUNTER — Other Ambulatory Visit: Payer: Self-pay | Admitting: *Deleted

## 2020-03-04 DIAGNOSIS — F17210 Nicotine dependence, cigarettes, uncomplicated: Secondary | ICD-10-CM

## 2020-03-04 DIAGNOSIS — Z87891 Personal history of nicotine dependence: Secondary | ICD-10-CM

## 2020-03-04 NOTE — Telephone Encounter (Signed)
Form located and placed in Dr Bari Mantis lookat in C pod.

## 2020-03-08 NOTE — Telephone Encounter (Signed)
Paperwork was completed using walk in OV on 03/01/20. Paperwork faxed to [facility]. Nothing further needed at this time.

## 2020-03-09 ENCOUNTER — Emergency Department (HOSPITAL_COMMUNITY): Payer: Medicare Other

## 2020-03-09 ENCOUNTER — Other Ambulatory Visit: Payer: Self-pay

## 2020-03-09 ENCOUNTER — Encounter (HOSPITAL_COMMUNITY): Payer: Self-pay

## 2020-03-09 ENCOUNTER — Emergency Department (HOSPITAL_COMMUNITY)
Admission: EM | Admit: 2020-03-09 | Discharge: 2020-03-10 | Disposition: A | Discharge status: Home / Self Care | Payer: Medicare Other | Source: Home / Self Care | Attending: Emergency Medicine | Admitting: Emergency Medicine

## 2020-03-09 DIAGNOSIS — W108XXA Fall (on) (from) other stairs and steps, initial encounter: Secondary | ICD-10-CM

## 2020-03-09 DIAGNOSIS — H748X2 Other specified disorders of left middle ear and mastoid: Secondary | ICD-10-CM | POA: Diagnosis not present

## 2020-03-09 DIAGNOSIS — Z20822 Contact with and (suspected) exposure to covid-19: Secondary | ICD-10-CM | POA: Diagnosis not present

## 2020-03-09 DIAGNOSIS — Z79899 Other long term (current) drug therapy: Secondary | ICD-10-CM | POA: Insufficient documentation

## 2020-03-09 DIAGNOSIS — I129 Hypertensive chronic kidney disease with stage 1 through stage 4 chronic kidney disease, or unspecified chronic kidney disease: Secondary | ICD-10-CM | POA: Insufficient documentation

## 2020-03-09 DIAGNOSIS — S42212A Unspecified displaced fracture of surgical neck of left humerus, initial encounter for closed fracture: Secondary | ICD-10-CM | POA: Insufficient documentation

## 2020-03-09 DIAGNOSIS — S42292A Other displaced fracture of upper end of left humerus, initial encounter for closed fracture: Secondary | ICD-10-CM

## 2020-03-09 DIAGNOSIS — M1612 Unilateral primary osteoarthritis, left hip: Secondary | ICD-10-CM | POA: Diagnosis not present

## 2020-03-09 DIAGNOSIS — E1122 Type 2 diabetes mellitus with diabetic chronic kidney disease: Secondary | ICD-10-CM | POA: Insufficient documentation

## 2020-03-09 DIAGNOSIS — S2242XA Multiple fractures of ribs, left side, initial encounter for closed fracture: Secondary | ICD-10-CM | POA: Diagnosis not present

## 2020-03-09 DIAGNOSIS — N179 Acute kidney failure, unspecified: Secondary | ICD-10-CM | POA: Diagnosis not present

## 2020-03-09 DIAGNOSIS — Z7982 Long term (current) use of aspirin: Secondary | ICD-10-CM | POA: Insufficient documentation

## 2020-03-09 DIAGNOSIS — I10 Essential (primary) hypertension: Secondary | ICD-10-CM | POA: Diagnosis not present

## 2020-03-09 DIAGNOSIS — N1832 Chronic kidney disease, stage 3b: Secondary | ICD-10-CM | POA: Diagnosis not present

## 2020-03-09 DIAGNOSIS — Y92009 Unspecified place in unspecified non-institutional (private) residence as the place of occurrence of the external cause: Secondary | ICD-10-CM | POA: Insufficient documentation

## 2020-03-09 DIAGNOSIS — S2249XA Multiple fractures of ribs, unspecified side, initial encounter for closed fracture: Secondary | ICD-10-CM | POA: Diagnosis not present

## 2020-03-09 DIAGNOSIS — N183 Chronic kidney disease, stage 3 unspecified: Secondary | ICD-10-CM | POA: Insufficient documentation

## 2020-03-09 DIAGNOSIS — S0990XA Unspecified injury of head, initial encounter: Secondary | ICD-10-CM | POA: Diagnosis not present

## 2020-03-09 DIAGNOSIS — M533 Sacrococcygeal disorders, not elsewhere classified: Secondary | ICD-10-CM | POA: Diagnosis not present

## 2020-03-09 DIAGNOSIS — J449 Chronic obstructive pulmonary disease, unspecified: Secondary | ICD-10-CM | POA: Insufficient documentation

## 2020-03-09 DIAGNOSIS — I6529 Occlusion and stenosis of unspecified carotid artery: Secondary | ICD-10-CM | POA: Diagnosis not present

## 2020-03-09 DIAGNOSIS — Z87891 Personal history of nicotine dependence: Secondary | ICD-10-CM | POA: Insufficient documentation

## 2020-03-09 DIAGNOSIS — G319 Degenerative disease of nervous system, unspecified: Secondary | ICD-10-CM | POA: Diagnosis not present

## 2020-03-09 DIAGNOSIS — M549 Dorsalgia, unspecified: Secondary | ICD-10-CM | POA: Diagnosis not present

## 2020-03-09 DIAGNOSIS — J9611 Chronic respiratory failure with hypoxia: Secondary | ICD-10-CM | POA: Diagnosis not present

## 2020-03-09 DIAGNOSIS — Z7984 Long term (current) use of oral hypoglycemic drugs: Secondary | ICD-10-CM | POA: Insufficient documentation

## 2020-03-09 DIAGNOSIS — Z7951 Long term (current) use of inhaled steroids: Secondary | ICD-10-CM | POA: Insufficient documentation

## 2020-03-09 DIAGNOSIS — S42202D Unspecified fracture of upper end of left humerus, subsequent encounter for fracture with routine healing: Secondary | ICD-10-CM | POA: Diagnosis not present

## 2020-03-09 DIAGNOSIS — M545 Low back pain, unspecified: Secondary | ICD-10-CM | POA: Diagnosis not present

## 2020-03-09 MED ORDER — HYDROCODONE-ACETAMINOPHEN 7.5-325 MG PO TABS: 1.0000 | ORAL_TABLET | Freq: Four times a day (QID) | ORAL | 0 refills | Status: DC | PRN

## 2020-03-09 MED ORDER — HYDROCODONE-ACETAMINOPHEN 5-325 MG PO TABS
1.0000 | ORAL_TABLET | Freq: Once | ORAL | Status: AC
  Administered 2020-03-09: 1 via ORAL
  Filled 2020-03-09: qty 1

## 2020-03-09 NOTE — ED Provider Notes (Signed)
[name] DEPT Provider Note   CSN: [id number] Arrival date & time: 03/09/20  1939   History Chief Complaint  Patient presents with  . Fall    [name] is a 65 y.o. male.  The history is provided by the patient.  Fall  He has history of hypertension, diabetes, hyperlipidemia, COPD and comes in after falling down approximately 10 steps at home.  He is complaining of pain in his left upper arm.  He denies head injury or loss of consciousness.  He is not on any anticoagulants.  Past Medical History:  Diagnosis Date  . Anxiety   . Arthritis   . Chronic narcotic use   . Complication of anesthesia    h/o aspiration during back surgery  . Depression   . Diabetic peripheral neuropathy ([place])   . Hearing loss    wears bilateral hearing aids, bilat moderate SNHL  . Hyperlipidemia   . Hypertension   . Insomnia   . Tobacco abuse   . Type 1 diabetes mellitus ([place])   . Ulcer of left ankle ([place])   . Vision abnormalities    mild DM retinopathy    Patient Active Problem List   Diagnosis Date Noted  . 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 Current smoker 12/17/2019  . Acute encephalopathy 03/31/2019  . AMS (altered mental status) 03/30/2019  . Cellulitis of lower extremity 06/01/2018  . Cellulitis of both lower extremities 05/31/2018  . Chronic respiratory failure with hypoxia ([place]) 05/31/2018  . HLD (hyperlipidemia) 05/31/2018  . CKD (chronic kidney disease), stage III ([place]) 05/31/2018  . Depression with anxiety 05/31/2018  . Chronic pain 05/31/2018  . Acute respiratory failure with hypoxia and hypercapnia ([place]) 04/14/2018  . Diabetes mellitus type 1 with manifestations ([place]) 04/14/2018  . Postop check 10/31/2017  . Central sleep apnea 10/14/2017  . Insomnia secondary to chronic pain 08/01/2017  . Contact with and (suspected) exposure to environmental tobacco smoke (acute) (chronic) 08/01/2017  . Snoring 08/01/2017  . Venous insufficiency  (chronic) (peripheral) 10/04/2016  . Essential hypertension 08/01/2013  . 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 Type I (juvenile type) diabetes mellitus with neurological manifestations, not stated as uncontrolled(250.61) 10/03/2012  . Healthcare-associated pneumonia 10/03/2012  . Dyslipidemia 10/03/2012  . Insomnia 10/03/2012  . Pain in joint, lower leg 10/02/2012  . Special screening for malignant neoplasms, colon 07/26/2010    Past Surgical History:  Procedure Laterality Date  . COCHLEAR IMPLANT Left 10/31/2017   Procedure: COCHLEAR IMPLANT LEFT EAR;  Surgeon: Vicie Mutters, MD;  Location: [facility];  Service: ENT;  Laterality: Left;  . COLONOSCOPY WITH PROPOFOL  07-26-2010  . Boswell had 16 tons of steel rolled over his feet  . LUMBAR LAMINECTOMY/DECOMPRESSION MICRODISCECTOMY  12-23-1997   LEFT  L5 -- S1  . TONSILLECTOMY         Family History  Problem Relation Age of Onset  . Prostate cancer Paternal Uncle   . Prostate cancer Maternal Grandfather   . Diabetes Mother   . Hypertension Mother   . Diabetes Father   . Heart failure Father     Social History   Tobacco Use  . Smoking status: Former Smoker    Packs/day: 1.00    Years: 40.00    Pack years: 40.00    Types: Cigarettes    Quit date: 02/16/2018    Years since quitting: 2.0  . Smokeless tobacco: Never Used  .  Tobacco comment: 8 ciggs daily-03/01/20-AH  Vaping Use  . Vaping Use: Never used  Substance Use Topics  . Alcohol use: No    Alcohol/week: 0.0 standard drinks  . Drug use: No    Home Medications Prior to Admission medications   Medication Sig Start Date End Date Taking? Authorizing Provider  albuterol (PROVENTIL) (2.5 MG/3ML) 0.083% nebulizer solution Take 3 mLs (2.5 mg total) by nebulization every 6 (six) hours as needed for wheezing or shortness of breath. 12/16/19   Martyn Ehrich, NP  amLODipine (NORVASC) 5 MG tablet Take 5 mg by mouth daily.     [provider]  aspirin EC 81 MG tablet Take 81 mg by mouth daily.    [provider]  divalproex (DEPAKOTE) 250 MG DR tablet Take 250 mg by mouth daily.    [provider]  doxepin (SINEQUAN) 25 MG capsule Take 25 mg by mouth at bedtime.    [provider]  Fluticasone-Umeclidin-Vilant (TRELEGY ELLIPTA) 100-62.5-25 MCG/INH AEPB Inhale 1 puff into the lungs daily. 01/20/20   Rigoberto Noel, MD  Fluticasone-Umeclidin-Vilant (TRELEGY ELLIPTA) 200-62.5-25 MCG/INH AEPB Inhale 1 puff into the lungs daily. Patient not taking: Reported on 03/01/2020 01/20/20   Rigoberto Noel, MD  furosemide (LASIX) 20 MG tablet TAKE 1 TABLET EVERY DAY Patient taking differently: Take 20 mg by mouth every other day. 05/07/19   Miquel Dunn, NP  gabapentin (NEURONTIN) 300 MG capsule Take 300 mg by mouth 2 (two) times daily.    [provider]  losartan (COZAAR) 100 MG tablet Take 100 mg by mouth daily.    [provider]  methadone (DOLOPHINE) 10 MG tablet Take 20 mg by mouth every 12 (twelve) hours.    [provider]  metoprolol tartrate (LOPRESSOR) 25 MG tablet Take 25-50 mg by mouth See admin instructions. Take 2 tablets (50 mg) by mouth every morning and 1 tablet (25 mg) at bedtime    [provider]  OXYGEN Inhale into the lungs. 3 liters    [provider]  sertraline (ZOLOFT) 50 MG tablet Take 50 mg by mouth every other day.    [provider]  simvastatin (ZOCOR) 20 MG tablet Take 20 mg by mouth at bedtime.    [provider]    Allergies    Codeine and Tetracyclines & related  Review of Systems   Review of Systems  All other systems reviewed and are negative.   Physical Exam Updated Vital Signs BP (!) 153/56 (BP Location: Right Arm)   Pulse 63   Temp 99.6 F (37.6 C) (Oral)   Resp 18   Ht 5\' 8"  (1.727 m)   Wt 66.7 kg   SpO2 97%   BMI 22.35 kg/m   Physical Exam Vitals and nursing note  reviewed.   65 year old male, resting comfortably and in no acute distress. Vital signs are significant for elevated blood pressure. Oxygen saturation is 97%, which is normal. Head is normocephalic and atraumatic. PERRLA, EOMI. Oropharynx is clear. Neck is nontender without adenopathy or JVD. Back is nontender and there is no CVA tenderness. Lungs are clear without rales, wheezes, or rhonchi. Chest is nontender. Heart has regular rate and rhythm without murmur. Abdomen is soft, flat, nontender without masses or hepatosplenomegaly and peristalsis is normoactive. Extremities: There is soft tissue swelling and tenderness in the proximal left humerus and shoulder.  There is pain with any passive movement of the right shoulder.  Full range of motion  present of all other joints without pain.  Distal pulses are strong, capillary refill is prompt.  There is normal motor function distal to the left shoulder. Skin is warm and dry without rash. Neurologic: Mental status is normal, cranial nerves are intact, there are no motor or sensory deficits.  ED Results / Procedures / Treatments    Radiology DG Elbow Complete Left  Result Date: 03/09/2020 CLINICAL DATA:  Fall with shoulder pain EXAM: LEFT ELBOW - COMPLETE 3+ VIEW COMPARISON:  None. FINDINGS: There is no evidence of fracture, dislocation, or joint effusion. There is no evidence of arthropathy or other focal bone abnormality. Soft tissues are unremarkable. IMPRESSION: Negative. Electronically Signed   By: Donavan Foil M.D.   On: 03/09/2020 21:56   DG Wrist Complete Left  Result Date: 03/09/2020 CLINICAL DATA:  Fall with wrist pain EXAM: LEFT WRIST - COMPLETE 3+ VIEW COMPARISON:  None. FINDINGS: No fracture or malalignment. Mild degenerative changes at the distal radioulnar joint, first MCP joint and STT interval. Soft tissues are unremarkable IMPRESSION: No acute osseous abnormality. Electronically Signed   By: Donavan Foil M.D.   On: 03/09/2020  21:57   DG Shoulder Left  Result Date: 03/09/2020 CLINICAL DATA:  Fall with shoulder pain EXAM: LEFT SHOULDER - 2+ VIEW COMPARISON:  08/23/2018 FINDINGS: AC joint is intact. There is calcific tendinosis at the humeral head. Acute comminuted and mildly displaced fracture involving left humeral neck and greater tuberosity. No humeral head dislocation. Slight inferior subluxation of the humeral head potentially due to effusion IMPRESSION: Acute comminuted and mildly displaced fracture involving the left humeral neck and greater tuberosity. Mild inferior subluxation of humeral head with respect to glenoid fossa but no displacement in anterior to posterior direction on Y-view. Question shoulder effusion. There is calcific tendinosis. Electronically Signed   By: Donavan Foil M.D.   On: 03/09/2020 21:55   DG Humerus Left  Result Date: 03/09/2020 CLINICAL DATA:  Fall with arm pain EXAM: LEFT HUMERUS - 2+ VIEW COMPARISON:  None. FINDINGS: Incompletely visualized comminuted fracture involving proximal humerus. The mid to distal portion of the humerus shows no fracture or malalignment IMPRESSION: Incompletely visualized comminuted proximal humerus fracture. Electronically Signed   By: Donavan Foil M.D.   On: 03/09/2020 21:53    Procedures Procedures   Medications Ordered in ED Medications  HYDROcodone-acetaminophen (NORCO/VICODIN) 5-325 MG per tablet 1 tablet (has no administration in time range)    ED Course  I have reviewed the triage vital signs and the nursing notes.  Pertinent labs & imaging results that were available during my care of the patient were reviewed by me and considered in my medical decision making (see chart for details).  MDM Rules/Calculators/A&P Fall with injury to left shoulder.  X-ray showed comminuted fracture of the surgical neck of the humerus with probable hemarthrosis.  He is placed in a sling and discharged with prescription for hydrocodone-acetaminophen.  He is  referred to orthopedics for follow-up.  Final Clinical Impression(s) / ED Diagnoses Final diagnoses:  Fall down stairs, initial encounter  Closed fracture of anatomical neck of left humerus, initial encounter  Elevated blood pressure reading with diagnosis of hypertension    Rx / DC Orders ED Discharge Orders         Ordered    HYDROcodone-acetaminophen (NORCO) 7.5-325 MG tablet  Every 6 hours PRN        03/09/20 8469           Delora Fuel, MD 62/95/28 2318

## 2020-03-09 NOTE — ED Triage Notes (Signed)
Pt arrives POV after a fall at home. Usually wears 3.5L  but did not bring any with him. Pt initial sats were 80%. C/o left arm pain and limited ROM.

## 2020-03-09 NOTE — Discharge Instructions (Addendum)
Wear sling at all times.  Apply ice for 30 minutes at a time, 4 times a day.  With your shoulder injury, you will probably find it difficult to get up from a lying position.  It may be better for you to sleep in a recliner, or propped up on several pillows.

## 2020-03-10 ENCOUNTER — Encounter (HOSPITAL_COMMUNITY): Payer: Self-pay

## 2020-03-10 ENCOUNTER — Inpatient Hospital Stay (HOSPITAL_COMMUNITY)
Admission: EM | Admit: 2020-03-10 | Discharge: 2020-03-15 | DRG: 184 | Disposition: A | Discharge status: Skilled Nursing Facility | Payer: Medicare Other | Attending: Internal Medicine | Admitting: Internal Medicine

## 2020-03-10 ENCOUNTER — Other Ambulatory Visit: Payer: Self-pay

## 2020-03-10 ENCOUNTER — Emergency Department (HOSPITAL_COMMUNITY): Payer: Medicare Other

## 2020-03-10 DIAGNOSIS — I129 Hypertensive chronic kidney disease with stage 1 through stage 4 chronic kidney disease, or unspecified chronic kidney disease: Secondary | ICD-10-CM | POA: Diagnosis present

## 2020-03-10 DIAGNOSIS — E108 Type 1 diabetes mellitus with unspecified complications: Secondary | ICD-10-CM | POA: Diagnosis present

## 2020-03-10 DIAGNOSIS — D649 Anemia, unspecified: Secondary | ICD-10-CM | POA: Diagnosis not present

## 2020-03-10 DIAGNOSIS — W1830XA Fall on same level, unspecified, initial encounter: Secondary | ICD-10-CM | POA: Diagnosis present

## 2020-03-10 DIAGNOSIS — Z974 Presence of external hearing-aid: Secondary | ICD-10-CM | POA: Diagnosis not present

## 2020-03-10 DIAGNOSIS — N179 Acute kidney failure, unspecified: Secondary | ICD-10-CM | POA: Diagnosis not present

## 2020-03-10 DIAGNOSIS — M545 Low back pain, unspecified: Secondary | ICD-10-CM | POA: Diagnosis not present

## 2020-03-10 DIAGNOSIS — J449 Chronic obstructive pulmonary disease, unspecified: Secondary | ICD-10-CM | POA: Diagnosis present

## 2020-03-10 DIAGNOSIS — E10319 Type 1 diabetes mellitus with unspecified diabetic retinopathy without macular edema: Secondary | ICD-10-CM | POA: Diagnosis present

## 2020-03-10 DIAGNOSIS — E10649 Type 1 diabetes mellitus with hypoglycemia without coma: Secondary | ICD-10-CM | POA: Diagnosis not present

## 2020-03-10 DIAGNOSIS — Z885 Allergy status to narcotic agent status: Secondary | ICD-10-CM | POA: Diagnosis not present

## 2020-03-10 DIAGNOSIS — S2249XA Multiple fractures of ribs, unspecified side, initial encounter for closed fracture: Secondary | ICD-10-CM | POA: Diagnosis not present

## 2020-03-10 DIAGNOSIS — Y92019 Unspecified place in single-family (private) house as the place of occurrence of the external cause: Secondary | ICD-10-CM | POA: Diagnosis not present

## 2020-03-10 DIAGNOSIS — D509 Iron deficiency anemia, unspecified: Secondary | ICD-10-CM | POA: Diagnosis present

## 2020-03-10 DIAGNOSIS — E1042 Type 1 diabetes mellitus with diabetic polyneuropathy: Secondary | ICD-10-CM | POA: Diagnosis present

## 2020-03-10 DIAGNOSIS — H9190 Unspecified hearing loss, unspecified ear: Secondary | ICD-10-CM | POA: Diagnosis present

## 2020-03-10 DIAGNOSIS — G4733 Obstructive sleep apnea (adult) (pediatric): Secondary | ICD-10-CM | POA: Diagnosis present

## 2020-03-10 DIAGNOSIS — D631 Anemia in chronic kidney disease: Secondary | ICD-10-CM | POA: Diagnosis present

## 2020-03-10 DIAGNOSIS — M2550 Pain in unspecified joint: Secondary | ICD-10-CM | POA: Diagnosis not present

## 2020-03-10 DIAGNOSIS — E785 Hyperlipidemia, unspecified: Secondary | ICD-10-CM | POA: Diagnosis present

## 2020-03-10 DIAGNOSIS — N1832 Chronic kidney disease, stage 3b: Secondary | ICD-10-CM | POA: Diagnosis present

## 2020-03-10 DIAGNOSIS — I10 Essential (primary) hypertension: Secondary | ICD-10-CM | POA: Diagnosis present

## 2020-03-10 DIAGNOSIS — Z881 Allergy status to other antibiotic agents status: Secondary | ICD-10-CM

## 2020-03-10 DIAGNOSIS — Z794 Long term (current) use of insulin: Secondary | ICD-10-CM

## 2020-03-10 DIAGNOSIS — S42212A Unspecified displaced fracture of surgical neck of left humerus, initial encounter for closed fracture: Secondary | ICD-10-CM | POA: Diagnosis present

## 2020-03-10 DIAGNOSIS — G319 Degenerative disease of nervous system, unspecified: Secondary | ICD-10-CM | POA: Diagnosis not present

## 2020-03-10 DIAGNOSIS — R404 Transient alteration of awareness: Secondary | ICD-10-CM | POA: Diagnosis not present

## 2020-03-10 DIAGNOSIS — Z8042 Family history of malignant neoplasm of prostate: Secondary | ICD-10-CM

## 2020-03-10 DIAGNOSIS — S2242XA Multiple fractures of ribs, left side, initial encounter for closed fracture: Principal | ICD-10-CM | POA: Diagnosis present

## 2020-03-10 DIAGNOSIS — Z79899 Other long term (current) drug therapy: Secondary | ICD-10-CM

## 2020-03-10 DIAGNOSIS — Z9981 Dependence on supplemental oxygen: Secondary | ICD-10-CM | POA: Diagnosis not present

## 2020-03-10 DIAGNOSIS — Z7951 Long term (current) use of inhaled steroids: Secondary | ICD-10-CM

## 2020-03-10 DIAGNOSIS — H748X2 Other specified disorders of left middle ear and mastoid: Secondary | ICD-10-CM | POA: Diagnosis not present

## 2020-03-10 DIAGNOSIS — Z888 Allergy status to other drugs, medicaments and biological substances status: Secondary | ICD-10-CM

## 2020-03-10 DIAGNOSIS — S42202D Unspecified fracture of upper end of left humerus, subsequent encounter for fracture with routine healing: Secondary | ICD-10-CM

## 2020-03-10 DIAGNOSIS — G8929 Other chronic pain: Secondary | ICD-10-CM | POA: Diagnosis not present

## 2020-03-10 DIAGNOSIS — M533 Sacrococcygeal disorders, not elsewhere classified: Secondary | ICD-10-CM | POA: Diagnosis not present

## 2020-03-10 DIAGNOSIS — J9611 Chronic respiratory failure with hypoxia: Secondary | ICD-10-CM | POA: Diagnosis not present

## 2020-03-10 DIAGNOSIS — W19XXXA Unspecified fall, initial encounter: Secondary | ICD-10-CM | POA: Diagnosis not present

## 2020-03-10 DIAGNOSIS — Z833 Family history of diabetes mellitus: Secondary | ICD-10-CM

## 2020-03-10 DIAGNOSIS — S42252A Displaced fracture of greater tuberosity of left humerus, initial encounter for closed fracture: Secondary | ICD-10-CM | POA: Diagnosis not present

## 2020-03-10 DIAGNOSIS — S42202A Unspecified fracture of upper end of left humerus, initial encounter for closed fracture: Secondary | ICD-10-CM | POA: Diagnosis present

## 2020-03-10 DIAGNOSIS — D696 Thrombocytopenia, unspecified: Secondary | ICD-10-CM | POA: Diagnosis not present

## 2020-03-10 DIAGNOSIS — E1022 Type 1 diabetes mellitus with diabetic chronic kidney disease: Secondary | ICD-10-CM | POA: Diagnosis present

## 2020-03-10 DIAGNOSIS — Z743 Need for continuous supervision: Secondary | ICD-10-CM | POA: Diagnosis not present

## 2020-03-10 DIAGNOSIS — R531 Weakness: Secondary | ICD-10-CM | POA: Diagnosis not present

## 2020-03-10 DIAGNOSIS — F418 Other specified anxiety disorders: Secondary | ICD-10-CM | POA: Diagnosis not present

## 2020-03-10 DIAGNOSIS — G894 Chronic pain syndrome: Secondary | ICD-10-CM | POA: Diagnosis present

## 2020-03-10 DIAGNOSIS — M1612 Unilateral primary osteoarthritis, left hip: Secondary | ICD-10-CM | POA: Diagnosis not present

## 2020-03-10 DIAGNOSIS — I6529 Occlusion and stenosis of unspecified carotid artery: Secondary | ICD-10-CM | POA: Diagnosis not present

## 2020-03-10 DIAGNOSIS — Z20822 Contact with and (suspected) exposure to covid-19: Secondary | ICD-10-CM | POA: Diagnosis present

## 2020-03-10 DIAGNOSIS — S0990XA Unspecified injury of head, initial encounter: Secondary | ICD-10-CM | POA: Diagnosis not present

## 2020-03-10 DIAGNOSIS — M549 Dorsalgia, unspecified: Secondary | ICD-10-CM | POA: Diagnosis not present

## 2020-03-10 DIAGNOSIS — Z8249 Family history of ischemic heart disease and other diseases of the circulatory system: Secondary | ICD-10-CM

## 2020-03-10 DIAGNOSIS — R0902 Hypoxemia: Secondary | ICD-10-CM | POA: Diagnosis not present

## 2020-03-10 DIAGNOSIS — Z7982 Long term (current) use of aspirin: Secondary | ICD-10-CM

## 2020-03-10 DIAGNOSIS — Z7401 Bed confinement status: Secondary | ICD-10-CM | POA: Diagnosis not present

## 2020-03-10 LAB — BASIC METABOLIC PANEL
Anion gap: 12 (ref 5–15)
BUN: 58 mg/dL — ABNORMAL HIGH (ref 8–23)
CO2: 27 mmol/L (ref 22–32)
Calcium: 8.4 mg/dL — ABNORMAL LOW (ref 8.9–10.3)
Chloride: 96 mmol/L — ABNORMAL LOW (ref 98–111)
Creatinine, Ser: 2.77 mg/dL — ABNORMAL HIGH (ref 0.61–1.24)
GFR, Estimated: 24 mL/min — ABNORMAL LOW (ref >60)
Glucose, Bld: 174 mg/dL — ABNORMAL HIGH (ref 70–99)
Potassium: 3.7 mmol/L (ref 3.5–5.1)
Sodium: 135 mmol/L (ref 135–145)

## 2020-03-10 LAB — CBC WITH DIFFERENTIAL/PLATELET
Abs Immature Granulocytes: 0.07 10*3/uL (ref 0.00–0.07)
Basophils Absolute: 0 10*3/uL (ref 0.0–0.1)
Basophils Relative: 0 %
Eosinophils Absolute: 0.1 10*3/uL (ref 0.0–0.5)
Eosinophils Relative: 1 %
HCT: 29.3 % — ABNORMAL LOW (ref 39.0–52.0)
Hemoglobin: 9.5 g/dL — ABNORMAL LOW (ref 13.0–17.0)
Immature Granulocytes: 1 %
Lymphocytes Relative: 12 %
Lymphs Abs: 0.9 10*3/uL (ref 0.7–4.0)
MCH: 28.5 pg (ref 26.0–34.0)
MCHC: 32.4 g/dL (ref 30.0–36.0)
MCV: 88 fL (ref 80.0–100.0)
Monocytes Absolute: 0.6 10*3/uL (ref 0.1–1.0)
Monocytes Relative: 8 %
Neutro Abs: 6 10*3/uL (ref 1.7–7.7)
Neutrophils Relative %: 78 %
Platelets: 121 10*3/uL — ABNORMAL LOW (ref 150–400)
RBC: 3.33 MIL/uL — ABNORMAL LOW (ref 4.22–5.81)
RDW: 13.7 % (ref 11.5–15.5)
WBC: 7.8 10*3/uL (ref 4.0–10.5)
nRBC: 0 % (ref 0.0–0.2)

## 2020-03-10 LAB — URINALYSIS, ROUTINE W REFLEX MICROSCOPIC
Bilirubin Urine: NEGATIVE
Glucose, UA: NEGATIVE mg/dL
Hgb urine dipstick: NEGATIVE
Ketones, ur: NEGATIVE mg/dL
Leukocytes,Ua: NEGATIVE
Nitrite: NEGATIVE
Protein, ur: 100 mg/dL — AB
Specific Gravity, Urine: 1.016 (ref 1.005–1.030)
pH: 5 (ref 5.0–8.0)

## 2020-03-10 MED ORDER — SODIUM CHLORIDE 0.9 % IV BOLUS
500.0000 mL | Freq: Once | INTRAVENOUS | Status: AC
  Administered 2020-03-10: 500 mL via INTRAVENOUS
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
## 2020-03-10 NOTE — ED Triage Notes (Addendum)
Pt via ems from home. Reports fall from toilet. Patient denies hitting anything. O2 sats  80's per ems due to oxygen disconnection. Wears 4L [place] at all times. Pt reports bilateral leg weakness after fall. Pts wife wants patient admitted to physical rehab. Was here yesterday due to fall.

## 2020-03-10 NOTE — ED Notes (Addendum)
Pt via ems to [facility]. Agree with triage note. A&Ox4 but needs reorientation at times. Vitals taken. Respirations regular/unlabored. Pt placed on 4L [place] O2 sats 98%. Connected to cardiac monitor, pulse ox, BP. Stretcher low, wheels locked, call bell within reach. Awaiting MD.

## 2020-03-11 ENCOUNTER — Encounter (HOSPITAL_COMMUNITY): Payer: Self-pay | Admitting: Family Medicine

## 2020-03-11 ENCOUNTER — Inpatient Hospital Stay (HOSPITAL_COMMUNITY): Payer: Medicare Other

## 2020-03-11 DIAGNOSIS — G8929 Other chronic pain: Secondary | ICD-10-CM | POA: Diagnosis not present

## 2020-03-11 DIAGNOSIS — N179 Acute kidney failure, unspecified: Secondary | ICD-10-CM | POA: Diagnosis not present

## 2020-03-11 DIAGNOSIS — J9611 Chronic respiratory failure with hypoxia: Secondary | ICD-10-CM | POA: Diagnosis not present

## 2020-03-11 DIAGNOSIS — S2242XA Multiple fractures of ribs, left side, initial encounter for closed fracture: Secondary | ICD-10-CM | POA: Diagnosis not present

## 2020-03-11 LAB — BASIC METABOLIC PANEL
Anion gap: 9 (ref 5–15)
BUN: 48 mg/dL — ABNORMAL HIGH (ref 8–23)
CO2: 27 mmol/L (ref 22–32)
Calcium: 7.6 mg/dL — ABNORMAL LOW (ref 8.9–10.3)
Chloride: 105 mmol/L (ref 98–111)
Creatinine, Ser: 2.01 mg/dL — ABNORMAL HIGH (ref 0.61–1.24)
GFR, Estimated: 36 mL/min — ABNORMAL LOW (ref >60)
Glucose, Bld: 135 mg/dL — ABNORMAL HIGH (ref 70–99)
Potassium: 3.6 mmol/L (ref 3.5–5.1)
Sodium: 141 mmol/L (ref 135–145)

## 2020-03-11 LAB — CBC
HCT: 24.7 % — ABNORMAL LOW (ref 39.0–52.0)
Hemoglobin: 7.8 g/dL — ABNORMAL LOW (ref 13.0–17.0)
MCH: 28.3 pg (ref 26.0–34.0)
MCHC: 31.6 g/dL (ref 30.0–36.0)
MCV: 89.5 fL (ref 80.0–100.0)
Platelets: 109 10*3/uL — ABNORMAL LOW (ref 150–400)
RBC: 2.76 MIL/uL — ABNORMAL LOW (ref 4.22–5.81)
RDW: 13.6 % (ref 11.5–15.5)
WBC: 4.2 10*3/uL (ref 4.0–10.5)
nRBC: 0 % (ref 0.0–0.2)

## 2020-03-11 LAB — SODIUM, URINE, RANDOM: Sodium, Ur: 45 mmol/L

## 2020-03-11 LAB — GLUCOSE, CAPILLARY
Glucose-Capillary: 134 mg/dL — ABNORMAL HIGH (ref 70–99)
Glucose-Capillary: 88 mg/dL (ref 70–99)

## 2020-03-11 LAB — IRON AND TIBC
Iron: 17 ug/dL — ABNORMAL LOW (ref 45–182)
Saturation Ratios: 10 % — ABNORMAL LOW (ref 17.9–39.5)
TIBC: 163 ug/dL — ABNORMAL LOW (ref 250–450)
UIBC: 146 ug/dL

## 2020-03-11 LAB — RETICULOCYTES
Immature Retic Fract: 21.3 % — ABNORMAL HIGH (ref 2.3–15.9)
RBC.: 2.73 MIL/uL — ABNORMAL LOW (ref 4.22–5.81)
Retic Count, Absolute: 38.2 10*3/uL (ref 19.0–186.0)
Retic Ct Pct: 1.4 % (ref 0.4–3.1)

## 2020-03-11 LAB — SARS CORONAVIRUS 2 (TAT 6-24 HRS): SARS Coronavirus 2: NEGATIVE

## 2020-03-11 LAB — HEMOGLOBIN A1C
Hgb A1c MFr Bld: 6.6 % — ABNORMAL HIGH (ref 4.8–5.6)
Mean Plasma Glucose: 142.72 mg/dL

## 2020-03-11 LAB — CBG MONITORING, ED
Glucose-Capillary: 117 mg/dL — ABNORMAL HIGH (ref 70–99)
Glucose-Capillary: 120 mg/dL — ABNORMAL HIGH (ref 70–99)
Glucose-Capillary: 127 mg/dL — ABNORMAL HIGH (ref 70–99)

## 2020-03-11 LAB — FERRITIN: Ferritin: 148 ng/mL (ref 24–336)

## 2020-03-11 LAB — HIV ANTIBODY (ROUTINE TESTING W REFLEX): HIV Screen 4th Generation wRfx: NONREACTIVE

## 2020-03-11 LAB — VITAMIN B12: Vitamin B-12: 350 pg/mL (ref 180–914)

## 2020-03-11 LAB — FOLATE: Folate: 12 ng/mL (ref >5.9)

## 2020-03-11 LAB — CREATININE, URINE, RANDOM: Creatinine, Urine: 153.92 mg/dL

## 2020-03-11 MED ORDER — ACETAMINOPHEN 650 MG RE SUPP: 650.0000 mg | Freq: Four times a day (QID) | RECTAL | Status: DC | PRN

## 2020-03-11 MED ORDER — SERTRALINE HCL 50 MG PO TABS
50.0000 mg | ORAL_TABLET | Freq: Every day | ORAL | Status: DC
  Administered 2020-03-11 – 2020-03-15 (×5): 50 mg via ORAL
  Filled 2020-03-11 (×5): qty 1

## 2020-03-11 MED ORDER — UMECLIDINIUM BROMIDE 62.5 MCG/INH IN AEPB
1.0000 | INHALATION_SPRAY | Freq: Every day | RESPIRATORY_TRACT | Status: DC
  Administered 2020-03-12 – 2020-03-15 (×4): 1 via RESPIRATORY_TRACT
  Filled 2020-03-11: qty 7

## 2020-03-11 MED ORDER — SIMVASTATIN 20 MG PO TABS
20.0000 mg | ORAL_TABLET | Freq: Every day | ORAL | Status: DC
  Administered 2020-03-11 – 2020-03-15 (×5): 20 mg via ORAL
  Filled 2020-03-11 (×5): qty 1

## 2020-03-11 MED ORDER — OXYCODONE HCL 5 MG PO TABS
5.0000 mg | ORAL_TABLET | Freq: Four times a day (QID) | ORAL | Status: DC | PRN
  Administered 2020-03-11 – 2020-03-15 (×12): 5 mg via ORAL
  Filled 2020-03-11 (×12): qty 1

## 2020-03-11 MED ORDER — SENNOSIDES-DOCUSATE SODIUM 8.6-50 MG PO TABS: 1.0000 | ORAL_TABLET | Freq: Every evening | ORAL | Status: DC | PRN

## 2020-03-11 MED ORDER — ONDANSETRON HCL 4 MG PO TABS: 4.0000 mg | ORAL_TABLET | Freq: Four times a day (QID) | ORAL | Status: DC | PRN

## 2020-03-11 MED ORDER — FLUTICASONE FUROATE-VILANTEROL 100-25 MCG/INH IN AEPB
1.0000 | INHALATION_SPRAY | Freq: Every day | RESPIRATORY_TRACT | Status: DC
  Administered 2020-03-12 – 2020-03-15 (×4): 1 via RESPIRATORY_TRACT
  Filled 2020-03-11: qty 28

## 2020-03-11 MED ORDER — INSULIN ASPART 100 UNIT/ML ~~LOC~~ SOLN
0.0000 [IU] | Freq: Every day | SUBCUTANEOUS | Status: DC
  Filled 2020-03-11: qty 0.05

## 2020-03-11 MED ORDER — METHADONE HCL 10 MG PO TABS
10.0000 mg | ORAL_TABLET | Freq: Every day | ORAL | Status: DC
  Administered 2020-03-11 – 2020-03-15 (×5): 10 mg via ORAL
  Filled 2020-03-11 (×5): qty 1

## 2020-03-11 MED ORDER — AMLODIPINE BESYLATE 5 MG PO TABS: 5.0000 mg | ORAL_TABLET | Freq: Two times a day (BID) | ORAL | Status: DC

## 2020-03-11 MED ORDER — FLUTICASONE-UMECLIDIN-VILANT 100-62.5-25 MCG/INH IN AEPB: 1.0000 | INHALATION_SPRAY | Freq: Every day | RESPIRATORY_TRACT | Status: DC

## 2020-03-11 MED ORDER — HEPARIN SODIUM (PORCINE) 5000 UNIT/ML IJ SOLN
5000.0000 [IU] | Freq: Three times a day (TID) | INTRAMUSCULAR | Status: DC
  Administered 2020-03-11 – 2020-03-15 (×13): 5000 [IU] via SUBCUTANEOUS
  Filled 2020-03-11 (×13): qty 1

## 2020-03-11 MED ORDER — HYDROMORPHONE HCL 1 MG/ML IJ SOLN
1.0000 mg | Freq: Four times a day (QID) | INTRAMUSCULAR | Status: DC | PRN
  Administered 2020-03-13: 1 mg via INTRAVENOUS
  Filled 2020-03-11: qty 1

## 2020-03-11 MED ORDER — ALBUTEROL SULFATE HFA 108 (90 BASE) MCG/ACT IN AERS
2.0000 | INHALATION_SPRAY | RESPIRATORY_TRACT | Status: DC | PRN
  Filled 2020-03-11: qty 6.7

## 2020-03-11 MED ORDER — METHADONE HCL 10 MG PO TABS
20.0000 mg | ORAL_TABLET | Freq: Every day | ORAL | Status: DC
  Administered 2020-03-11 – 2020-03-15 (×5): 20 mg via ORAL
  Filled 2020-03-11 (×4): qty 2
  Filled 2020-03-11: qty 4

## 2020-03-11 MED ORDER — INSULIN GLARGINE 100 UNIT/ML ~~LOC~~ SOLN
7.0000 [IU] | Freq: Every day | SUBCUTANEOUS | Status: DC
  Administered 2020-03-11 – 2020-03-13 (×3): 7 [IU] via SUBCUTANEOUS
  Filled 2020-03-11 (×4): qty 0.07

## 2020-03-11 MED ORDER — INSULIN ASPART 100 UNIT/ML ~~LOC~~ SOLN
0.0000 [IU] | Freq: Three times a day (TID) | SUBCUTANEOUS | Status: DC
  Administered 2020-03-11 (×2): 1 [IU] via SUBCUTANEOUS
  Filled 2020-03-11: qty 0.09

## 2020-03-11 MED ORDER — ONDANSETRON HCL 4 MG/2ML IJ SOLN: 4.0000 mg | Freq: Four times a day (QID) | INTRAMUSCULAR | Status: DC | PRN

## 2020-03-11 MED ORDER — METOPROLOL TARTRATE 25 MG PO TABS: 50.0000 mg | ORAL_TABLET | Freq: Every day | ORAL | Status: DC

## 2020-03-11 MED ORDER — INSULIN GLARGINE 100 UNIT/ML ~~LOC~~ SOLN
10.0000 [IU] | Freq: Every day | SUBCUTANEOUS | Status: DC
  Filled 2020-03-11: qty 0.1

## 2020-03-11 MED ORDER — SODIUM CHLORIDE 0.9 % IV SOLN: INTRAVENOUS | Status: AC

## 2020-03-11 MED ORDER — DOXEPIN HCL 25 MG PO CAPS
25.0000 mg | ORAL_CAPSULE | Freq: Every day | ORAL | Status: DC
  Administered 2020-03-11 – 2020-03-14 (×5): 25 mg via ORAL
  Filled 2020-03-11 (×6): qty 1

## 2020-03-11 MED ORDER — METOPROLOL TARTRATE 25 MG PO TABS
25.0000 mg | ORAL_TABLET | Freq: Every day | ORAL | Status: DC
  Administered 2020-03-11: 25 mg via ORAL
  Filled 2020-03-11: qty 1

## 2020-03-11 MED ORDER — GABAPENTIN 300 MG PO CAPS
300.0000 mg | ORAL_CAPSULE | Freq: Two times a day (BID) | ORAL | Status: DC
  Administered 2020-03-11 – 2020-03-15 (×10): 300 mg via ORAL
  Filled 2020-03-11 (×10): qty 1

## 2020-03-11 MED ORDER — DIVALPROEX SODIUM 250 MG PO DR TAB
250.0000 mg | DELAYED_RELEASE_TABLET | Freq: Every day | ORAL | Status: DC
  Administered 2020-03-11 – 2020-03-15 (×5): 250 mg via ORAL
  Filled 2020-03-11 (×5): qty 1

## 2020-03-11 MED ORDER — ACETAMINOPHEN 325 MG PO TABS
650.0000 mg | ORAL_TABLET | Freq: Four times a day (QID) | ORAL | Status: DC | PRN
  Administered 2020-03-13 – 2020-03-14 (×4): 650 mg via ORAL
  Filled 2020-03-11 (×5): qty 2

## 2020-03-11 MED ORDER — ASPIRIN EC 81 MG PO TBEC
81.0000 mg | DELAYED_RELEASE_TABLET | Freq: Every day | ORAL | Status: DC
  Administered 2020-03-11 – 2020-03-15 (×5): 81 mg via ORAL
  Filled 2020-03-11 (×5): qty 1

## 2020-03-11 NOTE — Progress Notes (Signed)
PT Cancellation Note  Patient Details Name: ALANN AVEY MRN: [id number] DOB: 03/11/1955   Cancelled Treatment:    Reason Eval/Treat Not Completed: Other (comment), patient admitted, moving to a room. Seen by OT in AM. PT will eval in AM.   Claretha Cooper 03/11/2020, 3:04 PM Tresa Endo PT Acute Rehabilitation Services Pager [phone] Office [phone]

## 2020-03-11 NOTE — Evaluation (Signed)
Occupational Therapy Evaluation Patient Details Name: [name] MRN: [id number] DOB: 11/16/1955 Today's Date: 03/11/2020    History of Present Illness [name] is a 65 y.o. male with medical history significant for COPD, OSA with CPAP intolerance, chronic hypoxic respiratory failure, chronic kidney disease 3B, insulin-dependent diabetes mellitus, hypertension, depression, anxiety, and chronic pain, who presents to the emergency department with general weakness and debility, and was unable to get up after sliding to the floor.  Patient reports that he was seen in the emergency department yesterday after a fall onto his left side, was found to have fracture involving the proximal left humerus, was discharged with orthopedic follow-up, but then slid to the ground tonight, was unable to get up with assistance from his wife, Patient found to have left sided rib fractures.   Clinical Impression   [name] is a 65 year old man who presents with fractured proximal humerus in sling and immobilized and left rib fractures with complaints of pain resulting in a decline in functional abilities. Patient required mod assist for bed mobility, min guard to stand and take steps and min-mod assist for LB ADLs and mod assist for UB ADLs. Patient currently on 4 L [place] and typically wears 2 at home. Reports it hurts to breath. O2 sat monitored during evaluation but pleth signal poor throughout but patient not symptomatic. Patient reports independence at home and wife will assist him though chart review reports patient's wife is concerned about not being able to care for him. Patient will benefit from skilled OT services while in hospital to improve deficits and learn compensatory strategies as needed in order to improve functional abilities. I suspect if patient improves in hospital he will be able to go home with caregiver assistance. Will recommend SNF if patient doesn't progress.      Follow Up  Recommendations  Home health OT;SNF    Equipment Recommendations  3 in 1 bedside commode;Tub/shower seat    Recommendations for Other Services       Precautions / Restrictions Precautions Precautions: Shoulder Type of Shoulder Precautions: No AROM, NO PROM Shoulder Interventions: Shoulder sling/immobilizer;At all times Restrictions Weight Bearing Restrictions: Yes LUE Weight Bearing: Non weight bearing      Mobility Bed Mobility Overal bed mobility: Needs Assistance Bed Mobility: Supine to Sit;Sit to Supine     Supine to sit: Mod assist;HOB elevated Sit to supine: Mod assist   General bed mobility comments: Mod assist for trunk lift to transfer to edge of bed. Mod assist for LEs to return to supine. both movements limited by pain.    Transfers Overall transfer level: Needs assistance Equipment used: None Transfers: Sit to/from Stand Sit to Stand: Min guard         General transfer comment: Min guard to stand from stretcher height and take steps left and right at edge of bed.    Balance Overall balance assessment: Mild deficits observed, not formally tested                                         ADL either performed or assessed with clinical judgement   ADL Overall ADL's : Needs assistance/impaired Eating/Feeding: Set up   Grooming: Set up   Upper Body Bathing: Moderate assistance;Set up   Lower Body Bathing: Minimal assistance;Sit to/from stand   Upper Body Dressing : Moderate assistance;Sitting   Lower Body  Dressing: Moderate assistance;Sit to/from stand   Toilet Transfer: Minimal assistance;BSC;Stand-pivot   Toileting- Water quality scientist and Hygiene: Min guard;Sitting/lateral lean               Vision Patient Visual Report: No change from baseline       Perception     Praxis      Pertinent Vitals/Pain       Hand Dominance     Extremity/Trunk Assessment Upper Extremity Assessment Upper Extremity Assessment:  RUE deficits/detail;LUE deficits/detail RUE Deficits / Details: WFL ROM and strength LUE Deficits / Details: Gross functional ROM of elbow, forearm, wrist and fingers. LUE: Unable to fully assess due to immobilization   Lower Extremity Assessment Lower Extremity Assessment: Defer to PT evaluation   Cervical / Trunk Assessment Cervical / Trunk Assessment: Normal   Communication     Cognition Arousal/Alertness: Awake/alert Behavior During Therapy: WFL for tasks assessed/performed Overall Cognitive Status: Within Functional Limits for tasks assessed                                     General Comments       Exercises     Shoulder Instructions      Home Living Family/patient expects to be discharged to:: Private residence Living Arrangements: Spouse/significant other                                      Prior Functioning/Environment                   OT Problem List: Decreased strength;Decreased range of motion;Decreased activity tolerance;Impaired balance (sitting and/or standing);Decreased knowledge of use of DME or AE;Impaired UE functional use;Pain      OT Treatment/Interventions: Self-care/ADL training;DME and/or AE instruction;Patient/family education;Therapeutic exercise;Balance training;Therapeutic activities    OT Goals(Current goals can be found in the care plan section) Acute Rehab OT Goals Patient Stated Goal: less pain and go home OT Goal Formulation: With patient Time For Goal Achievement: 03/25/20 Potential to Achieve Goals: Good  OT Frequency: Min 2X/week   Barriers to D/C:            Co-evaluation              AM-PAC OT "6 Clicks" Daily Activity     Outcome Measure Help from another person eating meals?: A Little Help from another person taking care of personal grooming?: A Little Help from another person toileting, which includes using toliet, bedpan, or urinal?: A Little Help from another person  bathing (including washing, rinsing, drying)?: A Lot Help from another person to put on and taking off regular upper body clothing?: A Lot Help from another person to put on and taking off regular lower body clothing?: A Lot 6 Click Score: 15   End of Session Equipment Utilized During Treatment: Oxygen Nurse Communication: Mobility status  Activity Tolerance: Patient limited by pain Patient left: in bed;with call bell/phone within reach  OT Visit Diagnosis: Unsteadiness on feet (R26.81);Pain Pain - Right/Left: Left Pain - part of body: Shoulder                Time: 1443-1540 OT Time Calculation (min): 17 min Charges:  OT General Charges $OT Visit: 1 Visit OT Evaluation $OT Eval Moderate Complexity: 1 Mod  Lamoine Magallon, OTR/L [place]  Office [phone] Pager: [phone]  Jewelz Ricklefs L Tawan Degroote 03/11/2020, 1:29 PM

## 2020-03-11 NOTE — ED Notes (Signed)
Repositioned patient  Emptied urinals Breakfast tray delivered

## 2020-03-11 NOTE — Progress Notes (Signed)
PROGRESS NOTE  [name] YIF:[id number] DOB: [birth year] DOA: 03/10/2020 PCP: Alan Battles, MD  HPI/Recap of past 24 hours: HPI from [name] is a 65 y.o. male with medical history significant for COPD, OSA with CPAP intolerance, chronic hypoxic respiratory failure (on 3.5L of O2), CKD 3B, DM type 1, HTN, depression, anxiety, and chronic pain, who presents to the ED with general weakness and debility, and was unable to get up after sliding to the floor. Patient reports that he was seen in the ED on 03/09/20, after a fall onto his left side, was found to have fracture involving the proximal left humerus, was discharged with orthopedic follow-up. Patient reports that he did hit his head when he fell on 03/09/2020 but denies any loss of consciousness, headache, or focal numbness or weakness.  He fell onto his left side and and has been experiencing pain in his upper left arm, and to lesser extent, the left hip and left flank. Patient's wife was concerned that he has become progressively weak in general over the past 2 years and she is unable to care for him at home any longer due to his profound debility.  He does not normally use a cane or walker.  He reports smoking 7 to 8 cigarettes/day and is actively working on cutting back with a goal of quitting again. Oxygen dependent. In the ED, saturating mid to upper 90s on his usual supplemental oxygen, and with stable blood pressure.  Chemistry panel is notable for BUN of 58 and creatinine 2.77, up from 1.6 in November. Head CT negative for acute intracranial normality. X-ray ribs demonstrate acute minimally displaced left 7 and 8 rib fractures. Pt admitted for further management    Today, pt noted to be drowsy, but still c/o shoulder and rib pain. Denies any other new complaints.     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)    Chronic respiratory failure with hypoxia (HCC)   Acute renal failure superimposed on stage 3b chronic kidney disease (HCC)   Depression with anxiety   Chronic pain   COPD, severe (HCC)   Closed fracture of proximal end of left humerus   Normocytic anemia   Thrombocytopenia (HCC)   Debility; falls; proximal left humerus fracture; rib fractures  Continue left arm sling, pulmonary toilet, pain-control Plan to talk to orthopedics PT/OT  AKI on CKD IIIb  SCr is 2.77 on admission, up from apparent baseline of ~1.6  Renal ultrasound normal Hold losartan and Lasix Continue gentle IVF, renally-dose medications    COPD with chronic hypoxic respiratory failure  Stable, continue supplemental O2, ICS/LAMA/LABA, and prn SABA    Hypertension   Stable Hold losartan, Lasix (due to AKI), hold Norvasc and metoprolol (noted brady episodes)  Type 1 DM A1c was 7.2% a year ago, repeat pending  SSI, Lantus, Accu-Cheks, hypoglycemic protocol  Anemia of chronic kidney disease Thrombocytopenia  Anemia panel pending Daily CBC    Depression, anxiety  Continue home regimen   Chronic pain syndrome Continue methadone  Will need short acting opiates briefly due to acute fractures     Estimated body mass index is 21.86 kg/m as calculated from the following:   Height as of this encounter: 5\' 9"  (1.753 m).   Weight as of this encounter: 67.1 kg.     Code Status: Full  Family Communication: None at bedside  Disposition Plan: Status  is: Inpatient  Remains inpatient appropriate because:Inpatient level of care appropriate due to severity of illness   Dispo: The patient is from: Home              Anticipated d/c is to: SNF              Patient currently is not medically stable to d/c.   Difficult to place patient No    Consultants:  None  Procedures:  None  Antimicrobials:  None  DVT prophylaxis: Heparin [name]   Objective: Vitals:   03/11/20 0915 03/11/20 0930  03/11/20 0945 03/11/20 1008  BP: (!) 149/100 (!) 160/118  125/80  Pulse: (!) 118 (!) 59 74 (!) 58  Resp:    16  Temp:    98.5 F (36.9 C)  TempSrc:    Oral  SpO2: (!) 72% 96% (!) 83% 97%  Weight:      Height:        Intake/Output Summary (Last 24 hours) at 03/11/2020 1305 Last data filed at 03/11/2020 0054 Gross per 24 hour  Intake 501.74 ml  Output --  Net 501.74 ml   Filed Weights   03/10/20 2049  Weight: 67.1 kg    Exam:  General: NAD, drowsy, left shoulder in sling  Cardiovascular: S1, S2 present  Respiratory: CTAB  Abdomen: Soft, nontender, nondistended, bowel sounds present  Musculoskeletal: No bilateral pedal edema noted  Skin: Normal  Psychiatry: Normal mood   Data Reviewed: CBC: Recent Labs  Lab 03/10/20 2136  WBC 7.8  NEUTROABS 6.0  HGB 9.5*  HCT 29.3*  MCV 88.0  PLT 034*   Basic Metabolic Panel: Recent Labs  Lab 03/10/20 2136  NA 135  K 3.7  CL 96*  CO2 27  GLUCOSE 174*  BUN 58*  CREATININE 2.77*  CALCIUM 8.4*   GFR: Estimated Creatinine Clearance: 24.9 mL/min (A) (by C-G formula based on SCr of 2.77 mg/dL (H)). Liver Function Tests: No results for input(s): AST, ALT, ALKPHOS, BILITOT, PROT, ALBUMIN in the last 168 hours. No results for input(s): LIPASE, AMYLASE in the last 168 hours. No results for input(s): AMMONIA in the last 168 hours. Coagulation Profile: No results for input(s): INR, PROTIME in the last 168 hours. Cardiac Enzymes: No results for input(s): CKTOTAL, CKMB, CKMBINDEX, TROPONINI in the last 168 hours. BNP (last 3 results) Recent Labs    12/16/19 1515  PROBNP 147.0*   HbA1C: No results for input(s): HGBA1C in the last 72 hours. CBG: Recent Labs  Lab 03/11/20 0107 03/11/20 0957  GLUCAP 117* 120*   Lipid Profile: No results for input(s): CHOL, HDL, LDLCALC, TRIG, CHOLHDL, LDLDIRECT in the last 72 hours. Thyroid Function Tests: No results for input(s): TSH, T4TOTAL, FREET4, T3FREE, THYROIDAB in the  last 72 hours. Anemia Panel: No results for input(s): VITAMINB12, FOLATE, FERRITIN, TIBC, IRON, RETICCTPCT in the last 72 hours. Urine analysis:    Component Value Date/Time   COLORURINE YELLOW 03/10/2020 2155   APPEARANCEUR CLEAR 03/10/2020 2155   LABSPEC 1.016 03/10/2020 2155   PHURINE 5.0 03/10/2020 2155   GLUCOSEU NEGATIVE 03/10/2020 2155   HGBUR NEGATIVE 03/10/2020 2155   BILIRUBINUR NEGATIVE 03/10/2020 2155   [place] NEGATIVE 03/10/2020 2155   PROTEINUR 100 (A) 03/10/2020 2155   NITRITE NEGATIVE 03/10/2020 2155   LEUKOCYTESUR NEGATIVE 03/10/2020 2155   Sepsis Labs: @LABRCNTIP (procalcitonin:4,lacticidven:4)  ) Recent Results (from the past 240 hour(s))  SARS CORONAVIRUS 2 (TAT 6-24 HRS) Nasopharyngeal Nasopharyngeal Swab     Status: None   Collection  Time: 03/10/20 11:40 PM   Specimen: Nasopharyngeal Swab  Result Value Ref Range Status   SARS Coronavirus 2 NEGATIVE NEGATIVE Final    Comment: (NOTE) SARS-CoV-2 target nucleic acids are NOT DETECTED.  The SARS-CoV-2 RNA is generally detectable in upper and lower respiratory specimens during the acute phase of infection. Negative results do not preclude SARS-CoV-2 infection, do not rule out co-infections with other pathogens, and should not be used as the sole basis for treatment or other patient management decisions. Negative results must be combined with clinical observations, patient history, and epidemiological information. The expected result is Negative.  Fact Sheet for Patients: SugarRoll.be  Fact Sheet for Healthcare Providers: https://www.woods-mathews.com/  This test is not yet approved or cleared by the [place] FDA and  has been authorized for detection and/or diagnosis of SARS-CoV-2 by FDA under an Emergency Use Authorization (EUA). This EUA will remain  in effect (meaning this test can be used) for the duration of the COVID-19 declaration under Se ction  564(b)(1) of the Act, 21 U.S.C. section 360bbb-3(b)(1), unless the authorization is terminated or revoked sooner.  Performed at [facility], [place] [address], [place], [place] [address]       Studies: DG Ribs Unilateral W/Chest Left  Result Date: 03/10/2020 CLINICAL DATA:  Fall with back pain EXAM: LEFT RIBS AND CHEST - 3+ VIEW COMPARISON:  12/16/2019 FINDINGS: Single-view chest demonstrates no focal opacity or pleural effusion. Normal cardiomediastinal silhouette. No pneumothorax. Left rib series demonstrates acute minimally displaced left seventh and eighth posterolateral rib fractures. There is an acute comminuted proximal left humerus fracture. IMPRESSION: Acute comminuted proximal left humerus fracture. Acute minimally displaced left seventh and eighth posterolateral rib fractures. No pneumothorax. Electronically Signed   By: Donavan Foil M.D.   On: 03/10/2020 22:44   DG Lumbar Spine Complete  Result Date: 03/10/2020 CLINICAL DATA:  Fall with back pain EXAM: LUMBAR SPINE - COMPLETE 4+ VIEW COMPARISON:  08/23/2018 FINDINGS: Mild dextroscoliosis of the spine. Lateral view is limited by oblique positioning. Vertebral body heights are grossly maintained. Moderate multilevel degenerative change with disc space narrowing and osteophyte. Aortic atherosclerosis. IMPRESSION: Limited by positioning. Moderate multilevel degenerative change. No definite acute osseous abnormality. Electronically Signed   By: Donavan Foil M.D.   On: 03/10/2020 22:40   CT Head Wo Contrast  Result Date: 03/10/2020 CLINICAL DATA:  Head trauma EXAM: CT HEAD WITHOUT CONTRAST TECHNIQUE: Contiguous axial images were obtained from the base of the skull through the vertex without intravenous contrast. 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 No gross acute territorial infarction, hemorrhage or intracranial mass is visualized. Mild periventricular Smits  matter hypodensity likely chronic small vessel ischemic change. Mild atrophy. Stable ventricle size. Vascular: No hyperdense vessels.  Carotid vascular calcification Skull: No fracture Sinuses/Orbits: No acute finding. Other: Small left mastoid effusion IMPRESSION: 1. 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 No gross CT evidence for acute intracranial abnormality. 2. Atrophy and mild chronic small vessel ischemic changes of the Jun matter. Electronically Signed   By: Donavan Foil M.D.   On: 03/10/2020 22:48   US RENAL  Result Date: 03/11/2020 CLINICAL DATA:  Acute renal failure. EXAM: RENAL / URINARY TRACT ULTRASOUND COMPLETE COMPARISON:  March 31, 2019 FINDINGS: Right Kidney: Renal measurements: 9.8 cm x 4.3 cm x 4.6 cm = volume: 99.9 mL. Echogenicity within normal limits. No mass or hydronephrosis visualized. Left Kidney: Renal measurements: 10.6 cm x  5.6 cm x 5.0 cm = volume: 155.2 mL. Echogenicity within normal limits. No mass or hydronephrosis visualized. Bladder: Appears normal for degree of bladder distention. Other: None. IMPRESSION: Normal renal ultrasound. Electronically Signed   By: Virgina Norfolk M.D.   On: 03/11/2020 00:47   DG Hip Unilat W or Wo Pelvis 2-3 Views Left  Result Date: 03/10/2020 CLINICAL DATA:  Fall EXAM: DG HIP (WITH OR WITHOUT PELVIS) 2-3V LEFT COMPARISON:  08/23/2018 FINDINGS: SI joints are non widened. Pubic symphysis and rami appear intact. No fracture or malalignment. Mild degenerative changes of the hips. IMPRESSION: No acute osseous abnormality. Electronically Signed   By: Donavan Foil M.D.   On: 03/10/2020 22:41    Scheduled Meds: . amLODipine  5 mg Oral BID  . aspirin EC  81 mg Oral Daily  . divalproex  250 mg Oral Daily  . doxepin  25 mg Oral QHS  . fluticasone furoate-vilanterol  1 puff Inhalation Daily  . gabapentin  300 mg Oral BID  . heparin  5,000 Units Subcutaneous Q8H  . insulin aspart  0-5 Units Subcutaneous QHS  . insulin  aspart  0-9 Units Subcutaneous TID WC  . insulin glargine  7 Units Subcutaneous QHS  . methadone  10 mg Oral q1600  . methadone  20 mg Oral QAC breakfast  . sertraline  50 mg Oral Daily  . simvastatin  20 mg Oral Daily  . umeclidinium bromide  1 puff Inhalation Daily    Continuous Infusions: . sodium chloride 100 mL/hr at 03/11/20 1238     LOS: 1 day     Alma Friendly, MD [facility]  If 7PM-7AM, please contact night-coverage [email or web address] 03/11/2020, 1:05 PM

## 2020-03-11 NOTE — ED Notes (Signed)
Ultrasound at bedside

## 2020-03-11 NOTE — H&P (Signed)
History and Physical    [name] EVO:[id number] DOB: 1955-11-06 DOA: 03/10/2020  PCP: Leanna Battles, MD   Patient coming from: Home   Chief Complaint: Falls   HPI: [name] is a 65 y.o. male with medical history significant for COPD, OSA with CPAP intolerance, chronic hypoxic respiratory failure, chronic kidney disease 3B, insulin-dependent diabetes mellitus, hypertension, depression, anxiety, and chronic pain, who presents to the emergency department with general weakness and debility, and was unable to get up after sliding to the floor.  Patient reports that he was seen in the emergency department yesterday after a fall onto his left side, was found to have fracture involving the proximal left humerus, was discharged with orthopedic follow-up, but then slid to the ground tonight, was unable to get up with assistance from his wife, and EMS was called for transport to the hospital.  Patient reports that he did hit his head when he fell on 03/09/2020 but denies any loss of consciousness, headache, or focal numbness or weakness.  He fell onto his left side and and has been experiencing pain in his upper left arm, and to lesser extent, the left hip and left flank.  He denies any recent chest pain or palpitations, denies fevers or chills, denies any significant cough, and denies any worsening shortness of breath.  Patient's wife was concerned that he has become progressively weak in general over the past 2 years and she is unable to care for him at home any longer due to his profound debility.  He does not normally use a cane or walker.  He reports smoking 7 to 8 cigarettes/day and is actively working on cutting back with a goal of quitting again.  ED Course: Upon arrival to the ED, patient is found to be afebrile, saturating mid to upper 90s on his usual supplemental oxygen, and with stable blood pressure.  Chemistry panel is notable for BUN of 58 and creatinine 2.77, up from 1.6 in November.   CBC with hemoglobin 9.5 and platelets 121,000.  Head CT negative for acute intracranial normality.  Radiographs of the left hip and pelvis are negative.  Lumbar spine radiographs notable for degenerative changes without acute finding.  X-ray ribs demonstrate acute minimally displaced left 7 and 8 rib fractures.  Patient was given 500 cc of saline in the ED.  COVID-19 screening test is pending.  Review of Systems:  All other systems reviewed and apart from HPI, are negative.  Past Medical History:  Diagnosis Date  . Anxiety   . Arthritis   . Chronic narcotic use   . Complication of anesthesia    h/o aspiration during back surgery  . Depression   . Diabetic peripheral neuropathy ([place])   . Hearing loss    wears bilateral hearing aids, bilat moderate SNHL  . Hyperlipidemia   . Hypertension   . Insomnia   . Tobacco abuse   . Type 1 diabetes mellitus ([place])   . Ulcer of left ankle ([place])   . Vision abnormalities    mild DM retinopathy    Past Surgical History:  Procedure Laterality Date  . COCHLEAR IMPLANT Left 10/31/2017   Procedure: COCHLEAR IMPLANT LEFT EAR;  Surgeon: Vicie Mutters, MD;  Location: [facility];  Service: ENT;  Laterality: Left;  . COLONOSCOPY WITH PROPOFOL  07-26-2010  . [place] had 16 tons of steel rolled over his feet  . LUMBAR LAMINECTOMY/DECOMPRESSION MICRODISCECTOMY  12-23-1997   LEFT  L5 -- S1  . TONSILLECTOMY      Social History:   reports that he quit smoking about 2 years ago. His smoking use included cigarettes. He has a 40.00 pack-year smoking history. He has never used smokeless tobacco. He reports that he does not drink alcohol and does not use drugs.  Allergies  Allergen Reactions  . Codeine Nausea And Vomiting  . Tetracyclines & Related Hives  . Venlafaxine     Other reaction(s): sexual sxs    Family History  Problem Relation Age of Onset  . Prostate cancer Paternal Uncle   . Prostate cancer Maternal Grandfather    . Diabetes Mother   . Hypertension Mother   . Diabetes Father   . Heart failure Father      Prior to Admission medications   Medication Sig Start Date End Date Taking? Authorizing Provider  acetaminophen (TYLENOL) 650 MG CR tablet Take 650 mg by mouth daily.   Yes [provider]  albuterol (PROVENTIL) (2.5 MG/3ML) 0.083% nebulizer solution Take 3 mLs (2.5 mg total) by nebulization every 6 (six) hours as needed for wheezing or shortness of breath. 12/16/19  Yes Martyn Ehrich, NP  amLODipine (NORVASC) 5 MG tablet Take 5 mg by mouth 2 (two) times daily.   Yes [provider]  aspirin EC 81 MG tablet Take 81 mg by mouth daily.   Yes [provider]  Budeson-Glycopyrrol-Formoterol (BREZTRI AEROSPHERE) 160-9-4.8 MCG/ACT AERO Inhale 2 puffs into the lungs 2 (two) times daily as needed (wheezing).   Yes [provider]  cephALEXin (KEFLEX) 500 MG capsule Take 500 mg by mouth 3 (three) times daily. Start date:03/08/20   Yes [provider]  divalproex (DEPAKOTE) 250 MG DR tablet Take 250 mg by mouth daily.   Yes [provider]  doxepin (SINEQUAN) 25 MG capsule Take 25 mg by mouth at bedtime.   Yes [provider]  furosemide (LASIX) 40 MG tablet Take 40 mg by mouth every other day.   Yes [provider]  gabapentin (NEURONTIN) 300 MG capsule Take 300 mg by mouth 2 (two) times daily.   Yes [provider]  Insulin Glargine (BASAGLAR KWIKPEN) 100 UNIT/ML Inject into the skin daily.   Yes [provider]  Insulin Glargine (BASAGLAR KWIKPEN) 100 UNIT/ML Inject 10 Units into the skin daily.   Yes [provider]  losartan (COZAAR) 100 MG tablet Take 100 mg by mouth daily.   Yes [provider]  methadone (DOLOPHINE) 10 MG tablet Take 10-20 mg by mouth See admin instructions. Takes 2 tablets in the morning  and 1 tablet in the afternoon.   Yes [provider]  metoprolol tartrate  (LOPRESSOR) 25 MG tablet Take 25-50 mg by mouth See admin instructions. Take 2 tablets (50 mg) by mouth every morning and 1 tablet (25 mg) at bedtime   Yes [provider]  OXYGEN Inhale 3.5 L into the lungs continuous.   Yes [provider]  sertraline (ZOLOFT) 50 MG tablet Take 50 mg by mouth daily.   Yes [provider]  simvastatin (ZOCOR) 20 MG tablet Take 20 mg by mouth daily.   Yes [provider]  Fluticasone-Umeclidin-Vilant (TRELEGY ELLIPTA) 100-62.5-25 MCG/INH AEPB Inhale 1 puff into the lungs daily. Patient not taking: No sig reported 01/20/20   Rigoberto Noel, MD  Fluticasone-Umeclidin-Vilant (TRELEGY ELLIPTA) 200-62.5-25 MCG/INH AEPB Inhale 1 puff into the lungs daily. Patient not taking: No sig reported 01/20/20   Kara Mead  V, MD  furosemide (LASIX) 20 MG tablet TAKE 1 TABLET EVERY DAY Patient not taking: No sig reported 05/07/19   Miquel Dunn, NP  HYDROcodone-acetaminophen (NORCO) 7.5-325 MG tablet Take 1-2 tablets by mouth every 6 (six) hours as needed for moderate pain. 6/76/19   Delora Fuel, MD  HYDROcodone-acetaminophen (NORCO) 7.5-325 MG tablet Take 1 tablet by mouth every 6 (six) hours as needed for moderate pain. Patient not taking: No sig reported    [provider]    Physical Exam: Vitals:   03/10/20 2049 03/10/20 2151 03/10/20 2245 03/10/20 2257  BP:  (!) 138/39 131/90 131/90  Pulse:  67 (!) 59 (!) 54  Resp:  20  17  Temp:      TempSrc:      SpO2:  100% 97% 97%  Weight: 67.1 kg     Height: 5\' 9"  (1.753 m)       Constitutional: NAD, calm  Eyes: PERTLA, lids and conjunctivae normal ENMT: Mucous membranes are moist. Posterior pharynx clear of any exudate or lesions.   Neck: normal, supple, no masses, no thyromegaly Respiratory: no wheezing, no crackles. No accessory muscle use.  Cardiovascular: S1 & S2 heard, regular rate and rhythm. No extremity edema.  Abdomen: No distension, no tenderness, soft. Bowel  sounds active.  Musculoskeletal: no clubbing / cyanosis. Proximal left arm ecchymosis, neurovascularly intact distally.   Skin: no significant rashes, lesions, ulcers. Warm, dry, well-perfused. Neurologic: CN 2-12 grossly intact. Sensation intact. Moving all extremities.  Psychiatric: Alert and oriented to person, place, and situation. Calm and cooperative.    Labs and Imaging on Admission: I have personally reviewed following labs and imaging studies  CBC: Recent Labs  Lab 03/10/20 2136  WBC 7.8  NEUTROABS 6.0  HGB 9.5*  HCT 29.3*  MCV 88.0  PLT 509*   Basic Metabolic Panel: Recent Labs  Lab 03/10/20 2136  NA 135  K 3.7  CL 96*  CO2 27  GLUCOSE 174*  BUN 58*  CREATININE 2.77*  CALCIUM 8.4*   GFR: Estimated Creatinine Clearance: 24.9 mL/min (A) (by C-G formula based on SCr of 2.77 mg/dL (H)). Liver Function Tests: No results for input(s): AST, ALT, ALKPHOS, BILITOT, PROT, ALBUMIN in the last 168 hours. No results for input(s): LIPASE, AMYLASE in the last 168 hours. No results for input(s): AMMONIA in the last 168 hours. Coagulation Profile: No results for input(s): INR, PROTIME in the last 168 hours. Cardiac Enzymes: No results for input(s): CKTOTAL, CKMB, CKMBINDEX, TROPONINI in the last 168 hours. BNP (last 3 results) Recent Labs    12/16/19 1515  PROBNP 147.0*   HbA1C: No results for input(s): HGBA1C in the last 72 hours. CBG: No results for input(s): GLUCAP in the last 168 hours. Lipid Profile: No results for input(s): CHOL, HDL, LDLCALC, TRIG, CHOLHDL, LDLDIRECT in the last 72 hours. Thyroid Function Tests: No results for input(s): TSH, T4TOTAL, FREET4, T3FREE, THYROIDAB in the last 72 hours. Anemia Panel: No results for input(s): VITAMINB12, FOLATE, FERRITIN, TIBC, IRON, RETICCTPCT in the last 72 hours. Urine analysis:    Component Value Date/Time   COLORURINE YELLOW 03/10/2020 2155   APPEARANCEUR CLEAR 03/10/2020 2155   LABSPEC 1.016 03/10/2020  2155   PHURINE 5.0 03/10/2020 2155   GLUCOSEU NEGATIVE 03/10/2020 2155   HGBUR NEGATIVE 03/10/2020 2155   BILIRUBINUR NEGATIVE 03/10/2020 2155   [place] NEGATIVE 03/10/2020 2155   PROTEINUR 100 (A) 03/10/2020 2155   NITRITE NEGATIVE 03/10/2020 2155   LEUKOCYTESUR NEGATIVE 03/10/2020 2155  Sepsis Labs: @LABRCNTIP (procalcitonin:4,lacticidven:4) )No results found for this or any previous visit (from the past 240 hour(s)).   Radiological Exams on Admission: DG Ribs Unilateral W/Chest Left  Result Date: 03/10/2020 CLINICAL DATA:  Fall with back pain EXAM: LEFT RIBS AND CHEST - 3+ VIEW COMPARISON:  12/16/2019 FINDINGS: Single-view chest demonstrates no focal opacity or pleural effusion. Normal cardiomediastinal silhouette. No pneumothorax. Left rib series demonstrates acute minimally displaced left seventh and eighth posterolateral rib fractures. There is an acute comminuted proximal left humerus fracture. IMPRESSION: Acute comminuted proximal left humerus fracture. Acute minimally displaced left seventh and eighth posterolateral rib fractures. No pneumothorax. Electronically Signed   By: Donavan Foil M.D.   On: 03/10/2020 22:44   DG Lumbar Spine Complete  Result Date: 03/10/2020 CLINICAL DATA:  Fall with back pain EXAM: LUMBAR SPINE - COMPLETE 4+ VIEW COMPARISON:  08/23/2018 FINDINGS: Mild dextroscoliosis of the spine. Lateral view is limited by oblique positioning. Vertebral body heights are grossly maintained. Moderate multilevel degenerative change with disc space narrowing and osteophyte. Aortic atherosclerosis. IMPRESSION: Limited by positioning. Moderate multilevel degenerative change. No definite acute osseous abnormality. Electronically Signed   By: Donavan Foil M.D.   On: 03/10/2020 22:40   DG Elbow Complete Left  Result Date: 03/09/2020 CLINICAL DATA:  Fall with shoulder pain EXAM: LEFT ELBOW - COMPLETE 3+ VIEW COMPARISON:  None. FINDINGS: There is no evidence of fracture,  dislocation, or joint effusion. There is no evidence of arthropathy or other focal bone abnormality. Soft tissues are unremarkable. IMPRESSION: Negative. Electronically Signed   By: Donavan Foil M.D.   On: 03/09/2020 21:56   DG Wrist Complete Left  Result Date: 03/09/2020 CLINICAL DATA:  Fall with wrist pain EXAM: LEFT WRIST - COMPLETE 3+ VIEW COMPARISON:  None. FINDINGS: No fracture or malalignment. Mild degenerative changes at the distal radioulnar joint, first MCP joint and STT interval. Soft tissues are unremarkable IMPRESSION: No acute osseous abnormality. Electronically Signed   By: Donavan Foil M.D.   On: 03/09/2020 21:57   CT Head Wo Contrast  Result Date: 03/10/2020 CLINICAL DATA:  Head trauma EXAM: CT HEAD WITHOUT CONTRAST TECHNIQUE: Contiguous axial images were obtained from the base of the skull through the vertex without intravenous contrast. 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 No gross acute territorial infarction, hemorrhage or intracranial mass is visualized. Mild periventricular [place] matter hypodensity likely chronic small vessel ischemic change. Mild atrophy. Stable ventricle size. Vascular: No hyperdense vessels.  Carotid vascular calcification Skull: No fracture Sinuses/Orbits: No acute finding. Other: Small left mastoid effusion IMPRESSION: 1. 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 No gross CT evidence for acute intracranial abnormality. 2. Atrophy and mild chronic small vessel ischemic changes of the [place] matter. Electronically Signed   By: Donavan Foil M.D.   On: 03/10/2020 22:48   DG Shoulder Left  Result Date: 03/09/2020 CLINICAL DATA:  Fall with shoulder pain EXAM: LEFT SHOULDER - 2+ VIEW COMPARISON:  08/23/2018 FINDINGS: AC joint is intact. There is calcific tendinosis at the humeral head. Acute comminuted and mildly displaced fracture involving left humeral neck and greater  tuberosity. No humeral head dislocation. Slight inferior subluxation of the humeral head potentially due to effusion IMPRESSION: Acute comminuted and mildly displaced fracture involving the left humeral neck and greater tuberosity. Mild inferior subluxation of humeral head with respect to glenoid fossa but no displacement in anterior to posterior direction on Y-view. Question shoulder effusion. There is  calcific tendinosis. Electronically Signed   By: Donavan Foil M.D.   On: 03/09/2020 21:55   DG Humerus Left  Result Date: 03/09/2020 CLINICAL DATA:  Fall with arm pain EXAM: LEFT HUMERUS - 2+ VIEW COMPARISON:  None. FINDINGS: Incompletely visualized comminuted fracture involving proximal humerus. The mid to distal portion of the humerus shows no fracture or malalignment IMPRESSION: Incompletely visualized comminuted proximal humerus fracture. Electronically Signed   By: Donavan Foil M.D.   On: 03/09/2020 21:53   DG Hip Unilat W or Wo Pelvis 2-3 Views Left  Result Date: 03/10/2020 CLINICAL DATA:  Fall EXAM: DG HIP (WITH OR WITHOUT PELVIS) 2-3V LEFT COMPARISON:  08/23/2018 FINDINGS: SI joints are non widened. Pubic symphysis and rami appear intact. No fracture or malalignment. Mild degenerative changes of the hips. IMPRESSION: No acute osseous abnormality. Electronically Signed   By: Donavan Foil M.D.   On: 03/10/2020 22:41    Assessment/Plan   1. Debility; falls; proximal left humerus fracture; rib fractures  - Patient with progressive debility over the course of months to years was seen in ED yesterday after a fall and found to have proximal left humerus fracture, was discharged home with ortho follow-up but fell again and was too debilitated to get up  - He is also found to have acute minimally displaced fractures of ribs 7 and 8 on the left   - Continue left arm sling, pulmonary toilet, pain-control, consult PT and OT   2. Acute kidney injury superimposed on CKD IIIb  - SCr is 2.77 on  admission, up from apparent baseline of ~1.6  - Check renal US and FEUrea, hold losartan and Lasix, continue gentle IVF hydration, renally-dose medications, and repeat chem panel in am    3. COPD with chronic hypoxic respiratory failure  - Stable, continue supplemental O2, ICS/LAMA/LABA, and prn SABA    4. Hypertension   - BP at goal  - Hold losartan and Lasix in light of worsened renal function, continue Norvasc and metoprolol as tolerated    5. Insulin-dependent DM  - A1c was 7.1% a year ago  - Continue CBG checks and insulin    6. Anemia; thrombocytopenia  - Hgb is 9.5 and platelets 121k on admission without overt bleeding  - Check anemia panel, monitor    7. Depression, anxiety  - Appears stable, continue home regimen   8. Chronic pain  - Continue methadone  - He has acute fractures and will also be treated with short-acting opiates as needed for now   DVT prophylaxis: sq heparin  Code Status: Full  Level of Care: Level of care: Med-Surg Family Communication: None available at time of admission  Disposition Plan:  Patient is from: Home  Anticipated d/c is to: TBD Anticipated d/c date is: 03/14/20 Patient currently: Pending therapy assessments, renal US and urine chemistries, improvement in renal function, pain-control  Consults called: None  Admission status: Inpatient     Vianne Bulls, MD [facility]  03/11/2020, 12:18 AM

## 2020-03-11 NOTE — Consult Note (Signed)
Chief Complaint: S/p fall - generalized weakness  History: Tawfiq Favila Whiteis a 65 y.o.malewith medical history significant forCOPD, OSA with CPAP intolerance, chronic hypoxic respiratory failure (on 3.5L of O2), CKD 3B, DM type 1, HTN, depression, anxiety, and chronic pain, who presents to the ED with general weakness and debility, and was unable to get up after sliding to the floor. Patient reports that he was seen in the ED on 03/09/20, after a fall onto his left side, was found to have fracture involving the proximal left humerus, was discharged with orthopedic follow-up. Patient reports that he did hit his head when he fell on 03/09/2020 but denies any loss of consciousness, headache, or focal numbness or weakness. He fell onto his left side and and has been experiencing pain in his upper left arm, and to lesser extent, the left hip and left flank. Patient's wife was concerned that he has become progressively weak in general over the past 2 years and she is unable to care for him at home any longer due to his profound debility. He does not normally use a cane or walker. He reports smoking 7 to 8 cigarettes/day and is actively working on cutting back with a goal of quitting again. Oxygen dependent. In the ED, saturating mid to upper 90s on his usual supplemental oxygen, and with stable blood pressure. X-ray ribs demonstrate acute minimally displaced left 7 and 8 rib fractures. Pt admitted for further management   ROS: No LOC, nausea.  Alert and orientated x3 - no confusion  Past Medical History:  Diagnosis Date  . Anxiety   . Arthritis   . Chronic narcotic use   . Complication of anesthesia    h/o aspiration during back surgery  . Depression   . Diabetic peripheral neuropathy ([place])   . Hearing loss    wears bilateral hearing aids, bilat moderate SNHL  . Hyperlipidemia   . Hypertension   . Insomnia   . Tobacco abuse   . Type 1 diabetes mellitus ([place])   . Ulcer of left ankle  ([place])   . Vision abnormalities    mild DM retinopathy    Allergies  Allergen Reactions  . Codeine Nausea And Vomiting  . Tetracyclines & Related Hives  . Venlafaxine     Other reaction(s): sexual sxs    No current facility-administered medications on file prior to encounter.   Current Outpatient Medications on File Prior to Encounter  Medication Sig Dispense Refill  . acetaminophen (TYLENOL) 650 MG CR tablet Take 650 mg by mouth daily.    Marland Kitchen albuterol (PROVENTIL) (2.5 MG/3ML) 0.083% nebulizer solution Take 3 mLs (2.5 mg total) by nebulization every 6 (six) hours as needed for wheezing or shortness of breath. 75 mL 5  . amLODipine (NORVASC) 5 MG tablet Take 5 mg by mouth 2 (two) times daily.    Marland Kitchen aspirin EC 81 MG tablet Take 81 mg by mouth daily.    . Budeson-Glycopyrrol-Formoterol (BREZTRI AEROSPHERE) 160-9-4.8 MCG/ACT AERO Inhale 2 puffs into the lungs 2 (two) times daily as needed (wheezing).    . cephALEXin (KEFLEX) 500 MG capsule Take 500 mg by mouth 3 (three) times daily. Start date:03/08/20    . divalproex (DEPAKOTE) 250 MG DR tablet Take 250 mg by mouth daily.    Marland Kitchen doxepin (SINEQUAN) 25 MG capsule Take 25 mg by mouth at bedtime.    . furosemide (LASIX) 40 MG tablet Take 40 mg by mouth every other day.    Marland Kitchen  gabapentin (NEURONTIN) 300 MG capsule Take 300 mg by mouth 2 (two) times daily.    . Insulin Glargine (BASAGLAR KWIKPEN) 100 UNIT/ML Inject into the skin daily.    . Insulin Glargine (BASAGLAR KWIKPEN) 100 UNIT/ML Inject 10 Units into the skin daily.    Marland Kitchen losartan (COZAAR) 100 MG tablet Take 100 mg by mouth daily.    . methadone (DOLOPHINE) 10 MG tablet Take 10-20 mg by mouth See admin instructions. Takes 2 tablets in the morning  and 1 tablet in the afternoon.    . metoprolol tartrate (LOPRESSOR) 25 MG tablet Take 25-50 mg by mouth See admin instructions. Take 2 tablets (50 mg) by mouth every morning and 1 tablet (25 mg) at bedtime    . OXYGEN Inhale 3.5 L into the lungs  continuous.    . sertraline (ZOLOFT) 50 MG tablet Take 50 mg by mouth daily.    . simvastatin (ZOCOR) 20 MG tablet Take 20 mg by mouth daily.    . Fluticasone-Umeclidin-Vilant (TRELEGY ELLIPTA) 100-62.5-25 MCG/INH AEPB Inhale 1 puff into the lungs daily. (Patient not taking: No sig reported) 60 each 11  . Fluticasone-Umeclidin-Vilant (TRELEGY ELLIPTA) 200-62.5-25 MCG/INH AEPB Inhale 1 puff into the lungs daily. (Patient not taking: No sig reported) 60 each 11  . furosemide (LASIX) 20 MG tablet TAKE 1 TABLET EVERY DAY (Patient not taking: No sig reported) 90 tablet 0  . HYDROcodone-acetaminophen (NORCO) 7.5-325 MG tablet Take 1-2 tablets by mouth every 6 (six) hours as needed for moderate pain. 20 tablet 0  . HYDROcodone-acetaminophen (NORCO) 7.5-325 MG tablet Take 1 tablet by mouth every 6 (six) hours as needed for moderate pain. (Patient not taking: No sig reported)      Physical Exam: Vitals:   03/11/20 1415 03/11/20 1546  BP:  (!) 143/65  Pulse:  61  Resp:  18  Temp:    SpO2: 99% (!) 84%   Body mass index is 21.86 kg/m. A+O x3 No SOB at present.  On supplemental O2  Complains of pain with palpation over the ribs bilaterally. Abd: soft/NT.  No rebount tenderness Left UE: sensation to light touch is intact.  No focal motor deficits in the extremity on exam.  No ecchymosis, postivie pain and mild swelling over the left shoulder.  Sling in place No pain with PROM or palpation of the right UE/bilateral LE's. Compartments soft/NT  Image: DG Ribs Unilateral W/Chest Left  Result Date: 03/10/2020 CLINICAL DATA:  Fall with back pain EXAM: LEFT RIBS AND CHEST - 3+ VIEW COMPARISON:  12/16/2019 FINDINGS: Single-view chest demonstrates no focal opacity or pleural effusion. Normal cardiomediastinal silhouette. No pneumothorax. Left rib series demonstrates acute minimally displaced left seventh and eighth posterolateral rib fractures. There is an acute comminuted proximal left humerus fracture.  IMPRESSION: Acute comminuted proximal left humerus fracture. Acute minimally displaced left seventh and eighth posterolateral rib fractures. No pneumothorax. Electronically Signed   By: Donavan Foil M.D.   On: 03/10/2020 22:44   DG Lumbar Spine Complete  Result Date: 03/10/2020 CLINICAL DATA:  Fall with back pain EXAM: LUMBAR SPINE - COMPLETE 4+ VIEW COMPARISON:  08/23/2018 FINDINGS: Mild dextroscoliosis of the spine. Lateral view is limited by oblique positioning. Vertebral body heights are grossly maintained. Moderate multilevel degenerative change with disc space narrowing and osteophyte. Aortic atherosclerosis. IMPRESSION: Limited by positioning. Moderate multilevel degenerative change. No definite acute osseous abnormality. Electronically Signed   By: Donavan Foil M.D.   On: 03/10/2020 22:40   DG Elbow Complete Left  Result Date: 03/09/2020 CLINICAL DATA:  Fall with shoulder pain EXAM: LEFT ELBOW - COMPLETE 3+ VIEW COMPARISON:  None. FINDINGS: There is no evidence of fracture, dislocation, or joint effusion. There is no evidence of arthropathy or other focal bone abnormality. Soft tissues are unremarkable. IMPRESSION: Negative. Electronically Signed   By: Donavan Foil M.D.   On: 03/09/2020 21:56   DG Wrist Complete Left  Result Date: 03/09/2020 CLINICAL DATA:  Fall with wrist pain EXAM: LEFT WRIST - COMPLETE 3+ VIEW COMPARISON:  None. FINDINGS: No fracture or malalignment. Mild degenerative changes at the distal radioulnar joint, first MCP joint and STT interval. Soft tissues are unremarkable IMPRESSION: No acute osseous abnormality. Electronically Signed   By: Donavan Foil M.D.   On: 03/09/2020 21:57   CT Head Wo Contrast  Result Date: 03/10/2020 CLINICAL DATA:  Head trauma EXAM: CT HEAD WITHOUT CONTRAST TECHNIQUE: Contiguous axial images were obtained from the base of the skull through the vertex without intravenous contrast. COMPARISON:  CT brain 03/30/2019 FINDINGS: Brain: Streak  artifact from left cochlear implant limits the exam. No gross acute territorial infarction, hemorrhage or intracranial mass is visualized. Mild periventricular Hermans matter hypodensity likely chronic small vessel ischemic change. Mild atrophy. Stable ventricle size. Vascular: No hyperdense vessels.  Carotid vascular calcification Skull: No fracture Sinuses/Orbits: No acute finding. Other: Small left mastoid effusion IMPRESSION: 1. 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 No gross CT evidence for acute intracranial abnormality. 2. Atrophy and mild chronic small vessel ischemic changes of the Glance matter. Electronically Signed   By: Donavan Foil M.D.   On: 03/10/2020 22:48   US RENAL  Result Date: 03/11/2020 CLINICAL DATA:  Acute renal failure. EXAM: RENAL / URINARY TRACT ULTRASOUND COMPLETE COMPARISON:  March 31, 2019 FINDINGS: Right Kidney: Renal measurements: 9.8 cm x 4.3 cm x 4.6 cm = volume: 99.9 mL. Echogenicity within normal limits. No mass or hydronephrosis visualized. Left Kidney: Renal measurements: 10.6 cm x 5.6 cm x 5.0 cm = volume: 155.2 mL. Echogenicity within normal limits. No mass or hydronephrosis visualized. Bladder: Appears normal for degree of bladder distention. Other: None. IMPRESSION: Normal renal ultrasound. Electronically Signed   By: Virgina Norfolk M.D.   On: 03/11/2020 00:47   DG Shoulder Left  Result Date: 03/09/2020 CLINICAL DATA:  Fall with shoulder pain EXAM: LEFT SHOULDER - 2+ VIEW COMPARISON:  08/23/2018 FINDINGS: AC joint is intact. There is calcific tendinosis at the humeral head. Acute comminuted and mildly displaced fracture involving left humeral neck and greater tuberosity. No humeral head dislocation. Slight inferior subluxation of the humeral head potentially due to effusion IMPRESSION: Acute comminuted and mildly displaced fracture involving the left humeral neck and greater tuberosity. Mild inferior subluxation of humeral head with respect to  glenoid fossa but no displacement in anterior to posterior direction on Y-view. Question shoulder effusion. There is calcific tendinosis. Electronically Signed   By: Donavan Foil M.D.   On: 03/09/2020 21:55   DG Humerus Left  Result Date: 03/09/2020 CLINICAL DATA:  Fall with arm pain EXAM: LEFT HUMERUS - 2+ VIEW COMPARISON:  None. FINDINGS: Incompletely visualized comminuted fracture involving proximal humerus. The mid to distal portion of the humerus shows no fracture or malalignment IMPRESSION: Incompletely visualized comminuted proximal humerus fracture. Electronically Signed   By: Donavan Foil M.D.   On: 03/09/2020 21:53   CT CHEST LUNG CA SCREEN LOW DOSE W/O CM  Result Date: 03/02/2020 CLINICAL DATA:  Fifty pack-year smoking history.  Current smoker.  EXAM: CT CHEST WITHOUT CONTRAST LOW-DOSE FOR LUNG CANCER SCREENING TECHNIQUE: Multidetector CT imaging of the chest was performed following the standard protocol without IV contrast. COMPARISON:  12/16/2019 chest radiograph. No prior CT. FINDINGS: Cardiovascular: Aortic atherosclerosis. Normal heart size, without pericardial effusion. Multivessel coronary artery atherosclerosis. Mediastinum/Nodes: Borderline precarinal node of 1.0 cm on 24/2. Multiple prevascular nodes including at up to 8 mm on 23/2. Hilar regions poorly evaluated without intravenous contrast. Lungs/Pleura: No pleural fluid. Moderate centrilobular emphysema. Left lower lobe scarring. Bilateral pulmonary nodules of maximally volume derived equivalent diameter 2.8 mm. A left upper lobe nodule, superimposed upon branching vessels, measures volume derived equivalent diameter 8.8 mm, including on 106/9. Upper Abdomen: Normal imaged portions of the liver, spleen, stomach, kidneys. Left greater than right adrenal nodularity. The left adrenal nodule measures 1.4 cm and has indeterminate density measurements on 58/2. Musculoskeletal: Mild bilateral gynecomastia. Advanced thoracic spondylosis.  IMPRESSION: 1. Lung-RADS 4A, suspicious. Follow up low-dose chest CT without contrast in 3 months (please use the following order, "CT CHEST LCS NODULE FOLLOW-UP W/O CM") is recommended. Alternatively, PET may be considered when there is a solid component 51mm or larger. Left upper lobe pulmonary nodule of volume derived equivalent diameter 8.8 mm. 2. Borderline to mild middle and anterior mediastinal adenopathy, indeterminate. Recommend attention on follow-up. 3. Aortic atherosclerosis (ICD10-I70.0), coronary artery atherosclerosis and emphysema (ICD10-J43.9). 4. Indeterminate left adrenal nodule. This could also be re-evaluated on follow-up screening CT or PET. 5. Mild bilateral gynecomastia. These results will be called to the ordering clinician or representative by the Radiologist Assistant, and communication documented in the PACS or [employer]. Electronically Signed   By: Abigail Miyamoto M.D.   On: 03/02/2020 09:21   DG Hip Unilat W or Wo Pelvis 2-3 Views Left  Result Date: 03/10/2020 CLINICAL DATA:  Fall EXAM: DG HIP (WITH OR WITHOUT PELVIS) 2-3V LEFT COMPARISON:  08/23/2018 FINDINGS: SI joints are non widened. Pubic symphysis and rami appear intact. No fracture or malalignment. Mild degenerative changes of the hips. IMPRESSION: No acute osseous abnormality. Electronically Signed   By: Donavan Foil M.D.   On: 03/10/2020 22:41    A/P: Patient s/p fall 2 days ago diagnosed with left proximal humerus fracture.  D/C'ed from ER with orthopedic follow up.  Patient fell from standing height today and was brought back to ER by his wife.  Patient with generalized weakness but no focal neurologic deficits.    Recommend CT scan of the left humerus to confirm that the fracture has not displaced further.  Defer pain management regimen to medical team.  Will review ct with my partner to determine appropriate management of the humerus fracture.    Further recommendations to follow.

## 2020-03-11 NOTE — ED Notes (Signed)
Care assumed. Report received from [place], [place]

## 2020-03-11 NOTE — ED Notes (Signed)
Lunch tray delivered.

## 2020-03-12 DIAGNOSIS — G8929 Other chronic pain: Secondary | ICD-10-CM | POA: Diagnosis not present

## 2020-03-12 DIAGNOSIS — J9611 Chronic respiratory failure with hypoxia: Secondary | ICD-10-CM | POA: Diagnosis not present

## 2020-03-12 DIAGNOSIS — N179 Acute kidney failure, unspecified: Secondary | ICD-10-CM | POA: Diagnosis not present

## 2020-03-12 DIAGNOSIS — S2242XA Multiple fractures of ribs, left side, initial encounter for closed fracture: Secondary | ICD-10-CM | POA: Diagnosis not present

## 2020-03-12 LAB — CBC
HCT: 23.3 % — ABNORMAL LOW (ref 39.0–52.0)
Hemoglobin: 7.3 g/dL — ABNORMAL LOW (ref 13.0–17.0)
MCH: 28.3 pg (ref 26.0–34.0)
MCHC: 31.3 g/dL (ref 30.0–36.0)
MCV: 90.3 fL (ref 80.0–100.0)
Platelets: 120 10*3/uL — ABNORMAL LOW (ref 150–400)
RBC: 2.58 MIL/uL — ABNORMAL LOW (ref 4.22–5.81)
RDW: 13.7 % (ref 11.5–15.5)
WBC: 3.6 10*3/uL — ABNORMAL LOW (ref 4.0–10.5)
nRBC: 0 % (ref 0.0–0.2)

## 2020-03-12 LAB — UREA NITROGEN, URINE: Urea Nitrogen, Ur: 479 mg/dL

## 2020-03-12 LAB — BASIC METABOLIC PANEL
Anion gap: 7 (ref 5–15)
BUN: 46 mg/dL — ABNORMAL HIGH (ref 8–23)
CO2: 27 mmol/L (ref 22–32)
Calcium: 8.2 mg/dL — ABNORMAL LOW (ref 8.9–10.3)
Chloride: 106 mmol/L (ref 98–111)
Creatinine, Ser: 1.97 mg/dL — ABNORMAL HIGH (ref 0.61–1.24)
GFR, Estimated: 37 mL/min — ABNORMAL LOW (ref >60)
Glucose, Bld: 87 mg/dL (ref 70–99)
Potassium: 4.1 mmol/L (ref 3.5–5.1)
Sodium: 140 mmol/L (ref 135–145)

## 2020-03-12 LAB — GLUCOSE, CAPILLARY
Glucose-Capillary: 70 mg/dL (ref 70–99)
Glucose-Capillary: 103 mg/dL — ABNORMAL HIGH (ref 70–99)
Glucose-Capillary: 108 mg/dL — ABNORMAL HIGH (ref 70–99)
Glucose-Capillary: 137 mg/dL — ABNORMAL HIGH (ref 70–99)

## 2020-03-12 MED ORDER — FERROUS SULFATE 325 (65 FE) MG PO TABS
325.0000 mg | ORAL_TABLET | Freq: Every day | ORAL | Status: DC
  Administered 2020-03-13 – 2020-03-15 (×3): 325 mg via ORAL
  Filled 2020-03-12 (×4): qty 1

## 2020-03-12 MED ORDER — VITAMIN B-12 1000 MCG PO TABS
1000.0000 ug | ORAL_TABLET | Freq: Every day | ORAL | Status: DC
  Administered 2020-03-12 – 2020-03-15 (×4): 1000 ug via ORAL
  Filled 2020-03-12 (×4): qty 1

## 2020-03-12 MED ORDER — AMLODIPINE BESYLATE 5 MG PO TABS
5.0000 mg | ORAL_TABLET | Freq: Two times a day (BID) | ORAL | Status: DC
  Administered 2020-03-12 – 2020-03-14 (×4): 5 mg via ORAL
  Filled 2020-03-12 (×4): qty 1

## 2020-03-12 MED ORDER — SODIUM CHLORIDE 0.9 % IV SOLN
510.0000 mg | Freq: Once | INTRAVENOUS | Status: AC
  Administered 2020-03-12: 510 mg via INTRAVENOUS
  Filled 2020-03-12: qty 510

## 2020-03-12 NOTE — Progress Notes (Addendum)
Transition of Care (TOC) -30 day Note       Patient Details  Name: [name]  MRN: [id number] Date of Birth: [birth year]   Transition of Care [facility]) CM/SW Contact  Name: Kathrin Greathouse Phone Number: [phone] Date: 03/12/2020 Time: 3:25PM   MUST ID:    To Whom it May Concern:   Please be advised that the above patient will require a short-term nursing home stay, anticipated 30 days or less rehabilitation and strengthening. The plan is for return home.

## 2020-03-12 NOTE — Progress Notes (Addendum)
PROGRESS NOTE  [name]:[id number] DOB: [birth year] DOA: 03/10/2020 PCP: Leanna Battles, MD  HPI/Recap of past 24 hours: HPI from Dr Stefan Church is a 65 y.o. male with medical history significant for COPD, OSA with CPAP intolerance, chronic hypoxic respiratory failure (on 3.5L of O2), CKD 3B, DM type 1, HTN, depression, anxiety, and chronic pain, who presents to the ED with general weakness and debility, and was unable to get up after sliding to the floor. Patient reports that he was seen in the ED on 03/09/20, after a fall onto his left side, was found to have fracture involving the proximal left humerus, was discharged with orthopedic follow-up. Patient reports that he did hit his head when he fell on 03/09/2020 but denies any loss of consciousness, headache, or focal numbness or weakness.  He fell onto his left side and and has been experiencing pain in his upper left arm, and to lesser extent, the left hip and left flank. Patient's wife was concerned that he has become progressively weak in general over the past 2 years and she is unable to care for him at home any longer due to his profound debility.  He does not normally use a cane or walker.  He reports smoking 7 to 8 cigarettes/day and is actively working on cutting back with a goal of quitting again. Oxygen dependent. In the ED, saturating mid to upper 90s on his usual supplemental oxygen, and with stable blood pressure.  Chemistry panel is notable for BUN of 58 and creatinine 2.77, up from 1.6 in November. Head CT negative for acute intracranial normality. X-ray ribs demonstrate acute minimally displaced left 7 and 8 rib fractures. Pt admitted for further management     Today, patient continues to complain of left-sided rib pain, left upper extremity pain, denies any other new complaints.  Very hard of hearing     Assessment/Plan: Principal Problem:   Rib fractures Active Problems:   Essential hypertension   Diabetes  mellitus type 1 with manifestations (HCC)   Chronic respiratory failure with hypoxia (HCC)   Acute renal failure superimposed on stage 3b chronic kidney disease (HCC)   Depression with anxiety   Chronic pain   COPD, severe (HCC)   Closed fracture of proximal end of left humerus   Normocytic anemia   Thrombocytopenia (HCC)   Debility; falls; proximal left humerus fracture; rib fractures  Continue left arm sling, pulmonary toilet, pain-control For left humerus fracture, [facility] consulted, recommend no surgical intervention and to follow-up as an outpatient with Dr. Stann Mainland in 1 week PT/OT-recommend SNF  AKI on CKD IIIb  SCr is 2.77 on admission, up from apparent baseline of ~1.6  Renal ultrasound normal Hold losartan and Lasix Continue gentle IVF, renally-dose medications    COPD with chronic hypoxic respiratory failure  Stable, continue supplemental O2, ICS/LAMA/LABA, and prn SABA    Hypertension   Stable Hold losartan, Lasix (due to AKI), metoprolol (noted brady episodes) Restart Norvasc  Type 1 DM, controlled A1c 6.6  SSI, Lantus, Accu-Cheks, hypoglycemic protocol  Anemia of chronic kidney disease/iron deficiency anemia Thrombocytopenia  Anemia panel showed iron 17, saturation 10, vitamin B12 350 Received 1 dose of Feraheme on 03/12/2020 Start oral iron and vitamin B12 supplements Will need outpatient screening colonoscopy, no record on file Daily CBC    Depression, anxiety  Continue home regimen   Chronic pain syndrome Continue methadone  Will need short acting opiates briefly due to acute fractures  Estimated body mass index is 21.86 kg/m as calculated from the following:   Height as of this encounter: 5\' 9"  (1.753 m).   Weight as of this encounter: 67.1 kg.     Code Status: Full  Family Communication: None at bedside  Disposition Plan: Status is: Inpatient  Remains inpatient appropriate because:Inpatient level of care appropriate due to  severity of illness   Dispo: The patient is from: Home              Anticipated d/c is to: SNF              Patient currently is not medically stable to d/c.   Difficult to place patient No    Consultants:  Orthopedics  Procedures:  None  Antimicrobials:  None  DVT prophylaxis: Heparin [place]   Objective: Vitals:   03/12/20 0147 03/12/20 0537 03/12/20 0748 03/12/20 1326  BP: (!) 115/41 (!) 129/48  (!) 143/66  Pulse: (!) 54 62  82  Resp: 14 14  18   Temp: 98.6 F (37 C) 98.4 F (36.9 C)  98.3 F (36.8 C)  TempSrc: Oral Oral  Oral  SpO2: 98% 96% 91% (!) 79%  Weight:      Height:        Intake/Output Summary (Last 24 hours) at 03/12/2020 1825 Last data filed at 03/12/2020 1818 Gross per 24 hour  Intake 677 ml  Output 1250 ml  Net -573 ml   Filed Weights   03/10/20 2049  Weight: 67.1 kg    Exam:  General: NAD, left shoulder in sling, HOH  Cardiovascular: S1, S2 present  Respiratory: CTAB  Abdomen: Soft, nontender, nondistended, bowel sounds present  Musculoskeletal: No bilateral pedal edema noted  Skin: Normal  Psychiatry: Normal mood   Data Reviewed: CBC: Recent Labs  Lab 03/10/20 2136 03/11/20 1330 03/12/20 0331  WBC 7.8 4.2 3.6*  NEUTROABS 6.0  --   --   HGB 9.5* 7.8* 7.3*  HCT 29.3* 24.7* 23.3*  MCV 88.0 89.5 90.3  PLT 121* 109* 371*   Basic Metabolic Panel: Recent Labs  Lab 03/10/20 2136 03/11/20 1330 03/12/20 0331  NA 135 141 140  K 3.7 3.6 4.1  CL 96* 105 106  CO2 27 27 27   GLUCOSE 174* 135* 87  BUN 58* 48* 46*  CREATININE 2.77* 2.01* 1.97*  CALCIUM 8.4* 7.6* 8.2*   GFR: Estimated Creatinine Clearance: 35 mL/min (A) (by C-G formula based on SCr of 1.97 mg/dL (H)). Liver Function Tests: No results for input(s): AST, ALT, ALKPHOS, BILITOT, PROT, ALBUMIN in the last 168 hours. No results for input(s): LIPASE, AMYLASE in the last 168 hours. No results for input(s): AMMONIA in the last 168 hours. Coagulation Profile: No  results for input(s): INR, PROTIME in the last 168 hours. Cardiac Enzymes: No results for input(s): CKTOTAL, CKMB, CKMBINDEX, TROPONINI in the last 168 hours. BNP (last 3 results) Recent Labs    12/16/19 1515  PROBNP 147.0*   HbA1C: Recent Labs    03/11/20 1330  HGBA1C 6.6*   CBG: Recent Labs  Lab 03/11/20 1700 03/11/20 2235 03/12/20 0727 03/12/20 1118 03/12/20 1707  GLUCAP 134* 88 70 103* 108*   Lipid Profile: No results for input(s): CHOL, HDL, LDLCALC, TRIG, CHOLHDL, LDLDIRECT in the last 72 hours. Thyroid Function Tests: No results for input(s): TSH, T4TOTAL, FREET4, T3FREE, THYROIDAB in the last 72 hours. Anemia Panel: Recent Labs    03/11/20 1330 03/11/20 1420  VITAMINB12  --  350  FOLATE  --  12.0  FERRITIN  --  148  TIBC  --  163*  IRON  --  17*  RETICCTPCT 1.4  --    Urine analysis:    Component Value Date/Time   COLORURINE YELLOW 03/10/2020 2155   APPEARANCEUR CLEAR 03/10/2020 2155   LABSPEC 1.016 03/10/2020 2155   PHURINE 5.0 03/10/2020 2155   GLUCOSEU NEGATIVE 03/10/2020 2155   HGBUR NEGATIVE 03/10/2020 2155   BILIRUBINUR NEGATIVE 03/10/2020 2155   [place] NEGATIVE 03/10/2020 2155   PROTEINUR 100 (A) 03/10/2020 2155   NITRITE NEGATIVE 03/10/2020 2155   LEUKOCYTESUR NEGATIVE 03/10/2020 2155   Sepsis Labs: @LABRCNTIP (procalcitonin:4,lacticidven:4)  ) Recent Results (from the past 240 hour(s))  SARS CORONAVIRUS 2 (TAT 6-24 HRS) Nasopharyngeal Nasopharyngeal Swab     Status: None   Collection Time: 03/10/20 11:40 PM   Specimen: Nasopharyngeal Swab  Result Value Ref Range Status   SARS Coronavirus 2 NEGATIVE NEGATIVE Final    Comment: (NOTE) SARS-CoV-2 target nucleic acids are NOT DETECTED.  The SARS-CoV-2 RNA is generally detectable in upper and lower respiratory specimens during the acute phase of infection. Negative results do not preclude SARS-CoV-2 infection, do not rule out co-infections with other pathogens, and should not be  used as the sole basis for treatment or other patient management decisions. Negative results must be combined with clinical observations, patient history, and epidemiological information. The expected result is Negative.  Fact Sheet for Patients: SugarRoll.be  Fact Sheet for Healthcare Providers: https://www.woods-mathews.com/  This test is not yet approved or cleared by the [place] FDA and  has been authorized for detection and/or diagnosis of SARS-CoV-2 by FDA under an Emergency Use Authorization (EUA). This EUA will remain  in effect (meaning this test can be used) for the duration of the COVID-19 declaration under Se ction 564(b)(1) of the Act, 21 U.S.C. section 360bbb-3(b)(1), unless the authorization is terminated or revoked sooner.  Performed at [facility], [place] [address], [place], [place] [address]       Studies: CT SHOULDER LEFT WO CONTRAST  Result Date: 03/12/2020 CLINICAL DATA:  Left proximal humerus fracture after fall. EXAM: CT OF THE UPPER LEFT EXTREMITY WITHOUT CONTRAST TECHNIQUE: Multidetector CT imaging of the left shoulder was performed according to the standard protocol. COMPARISON:  Left shoulder x-rays dated March 09, 2020. MRI left shoulder dated September 04, 2008. FINDINGS: Bones/Joint/Cartilage Acute comminuted impacted fracture of the proximal humerus surgical neck extending into the posterior greater tuberosity. There is up to 6 mm anterior displacement and 1.5 cm of impaction. No additional fracture. No dislocation. Mild acromioclavicular and glenohumeral joint space narrowing with small marginal osteophytes. Type 3 acromion with prominent subacromial spurring. Small glenohumeral joint effusion. Ligaments Ligaments are suboptimally evaluated by CT. Muscles and Tendons Grossly intact. Scattered foci of hydroxyapatite deposition within the supraspinatus and infraspinatus tendons. No muscle atrophy. Soft  tissue No fluid collection or hematoma. No soft tissue mass. Mild centrilobular emphysema in the visualized left lung. IMPRESSION: 1. Acute comminuted mildly displaced and impacted fracture of the proximal humerus surgical neck extending into the posterior greater tuberosity. 2. Mild glenohumeral and acromioclavicular osteoarthritis. 3. Emphysema (ICD10-J43.9). Electronically Signed   By: Titus Dubin M.D.   On: 03/12/2020 08:19    Scheduled Meds:  aspirin EC  81 mg Oral Daily   divalproex  250 mg Oral Daily   doxepin  25 mg Oral QHS   [START ON 03/13/2020] ferrous sulfate  325 mg Oral Q breakfast   fluticasone furoate-vilanterol  1 puff  Inhalation Daily   gabapentin  300 mg Oral BID   heparin  5,000 Units Subcutaneous Q8H   insulin aspart  0-5 Units Subcutaneous QHS   insulin aspart  0-9 Units Subcutaneous TID WC   insulin glargine  7 Units Subcutaneous QHS   methadone  10 mg Oral q1600   methadone  20 mg Oral QAC breakfast   sertraline  50 mg Oral Daily   simvastatin  20 mg Oral Daily   umeclidinium bromide  1 puff Inhalation Daily    Continuous Infusions:    LOS: 2 days     Alma Friendly, MD [facility]  If 7PM-7AM, please contact night-coverage [email or web address] 03/12/2020, 6:25 PM

## 2020-03-12 NOTE — NC FL2 (Signed)
[place] LEVEL OF CARE SCREENING TOOL     IDENTIFICATION  Patient Name: [name] Birthdate: 09/23/1955 Sex: male Admission Date (Current Location): 03/10/2020  [facility] and [place] Number:  Herbalist and Address:  [facility],  [place], [place]      Provider Number: [id number]  Attending Physician Name and Address:  Alma Friendly, MD  Relative Name and Phone Number:  Erice, Ahles Spouse [phone]    Current Level of Care: Hospital Recommended Level of Care: [place] Prior Approval Number:    Date Approved/Denied:   PASRR Number:    Discharge Plan: SNF    Current Diagnoses: Patient Active Problem List   Diagnosis Date Noted  . Rib fractures 03/10/2020  . Closed fracture of proximal end of left humerus 03/10/2020  . Normocytic anemia 03/10/2020  . Thrombocytopenia ([place]) 03/10/2020  . COPD, severe ([place]) 12/17/2019  . Current smoker 12/17/2019  . Acute encephalopathy 03/31/2019  . AMS (altered mental status) 03/30/2019  . Cellulitis of lower extremity 06/01/2018  . Cellulitis of both lower extremities 05/31/2018  . Chronic respiratory failure with hypoxia ([place]) 05/31/2018  . HLD (hyperlipidemia) 05/31/2018  . Acute renal failure superimposed on stage 3b chronic kidney disease ([place]) 05/31/2018  . Depression with anxiety 05/31/2018  . Chronic pain 05/31/2018  . Acute respiratory failure with hypoxia and hypercapnia ([place]) 04/14/2018  . Diabetes mellitus type 1 with manifestations ([place]) 04/14/2018  . Postop check 10/31/2017  . Central sleep apnea 10/14/2017  . Insomnia secondary to chronic pain 08/01/2017  . Contact with and (suspected) exposure to environmental tobacco smoke (acute) (chronic) 08/01/2017  . Snoring 08/01/2017  . Venous insufficiency (chronic) (peripheral) 10/04/2016  . Essential hypertension 08/01/2013  . Chronic ulcer of left ankle ([place]) 08/01/2013  . Type I  (juvenile type) diabetes mellitus with neurological manifestations, not stated as uncontrolled(250.61) 10/03/2012  . Healthcare-associated pneumonia 10/03/2012  . Dyslipidemia 10/03/2012  . Insomnia 10/03/2012  . Pain in joint, lower leg 10/02/2012  . Special screening for malignant neoplasms, colon 07/26/2010    Orientation RESPIRATION BLADDER Height & Weight     Self,Time,Situation,Place  O2 Continent Weight: 148 lb (67.1 kg) Height:  5\' 9"  (175.3 cm)  BEHAVIORAL SYMPTOMS/MOOD NEUROLOGICAL BOWEL NUTRITION STATUS      Continent Diet (Heart Healthy)  AMBULATORY STATUS COMMUNICATION OF NEEDS Skin   Extensive Assist Verbally Bruising                       Personal Care Assistance Level of Assistance  Bathing,Feeding,Dressing Bathing Assistance: Maximum assistance Feeding assistance: Maximum assistance Dressing Assistance: Maximum assistance     Functional Limitations Info  Sight,Hearing,Speech Sight Info: Impaired Hearing Info: Impaired Speech Info: Adequate    SPECIAL CARE FACTORS FREQUENCY  PT (By licensed PT),OT (By licensed OT)     PT Frequency: 5x/week OT Frequency: 5x/week            Contractures Contractures Info: Not present    Additional Factors Info  Code Status,Allergies,Psychotropic,Insulin Sliding Scale Code Status Info: Fullcode Allergies Info: Allergies: Codeine, Tetracyclines & Related, Venlafaxine Psychotropic Info: Zoloft Insulin Sliding Scale Info: See medication list       Current Medications (03/12/2020):  This is the current hospital active medication list Current Facility-Administered Medications  Medication Dose Route Frequency Provider Last Rate Last Admin  . acetaminophen (TYLENOL) tablet 650 mg  650 mg Oral Q6H PRN Opyd, Ilene Qua, MD  Or  . acetaminophen (TYLENOL) suppository 650 mg  650 mg Rectal Q6H PRN Opyd, Timothy S, MD      . albuterol (VENTOLIN HFA) 108 (90 Base) MCG/ACT inhaler 2 puff  2 puff Inhalation Q4H PRN  Opyd, Ilene Qua, MD      . aspirin EC tablet 81 mg  81 mg Oral Daily Opyd, Ilene Qua, MD   81 mg at 03/12/20 0933  . divalproex (DEPAKOTE) DR tablet 250 mg  250 mg Oral Daily Opyd, Ilene Qua, MD   250 mg at 03/12/20 0933  . doxepin (SINEQUAN) capsule 25 mg  25 mg Oral QHS Opyd, Ilene Qua, MD   25 mg at 03/11/20 2220  . [START ON 03/13/2020] ferrous sulfate tablet 325 mg  325 mg Oral Q breakfast Alma Friendly, MD      . fluticasone furoate-vilanterol (BREO ELLIPTA) 100-25 MCG/INH 1 puff  1 puff Inhalation Daily Opyd, Ilene Qua, MD   1 puff at 03/12/20 0752  . gabapentin (NEURONTIN) capsule 300 mg  300 mg Oral BID Opyd, Ilene Qua, MD   300 mg at 03/12/20 0933  . heparin injection 5,000 Units  5,000 Units Subcutaneous Q8H Vianne Bulls, MD   5,000 Units at 03/12/20 1208  . HYDROmorphone (DILAUDID) injection 1 mg  1 mg Intravenous Q6H PRN Opyd, Ilene Qua, MD      . insulin aspart (novoLOG) injection 0-5 Units  0-5 Units Subcutaneous QHS Opyd, Timothy S, MD      . insulin aspart (novoLOG) injection 0-9 Units  0-9 Units Subcutaneous TID WC Opyd, Ilene Qua, MD   1 Units at 03/11/20 1821  . insulin glargine (LANTUS) injection 7 Units  7 Units Subcutaneous QHS Opyd, Ilene Qua, MD   7 Units at 03/11/20 2220  . methadone (DOLOPHINE) tablet 10 mg  10 mg Oral q1600 Opyd, Ilene Qua, MD   10 mg at 03/11/20 1636  . methadone (DOLOPHINE) tablet 20 mg  20 mg Oral QAC breakfast Opyd, Ilene Qua, MD   20 mg at 03/12/20 0756  . ondansetron (ZOFRAN) tablet 4 mg  4 mg Oral Q6H PRN Opyd, Ilene Qua, MD       Or  . ondansetron (ZOFRAN) injection 4 mg  4 mg Intravenous Q6H PRN Opyd, Ilene Qua, MD      . oxyCODONE (Oxy IR/ROXICODONE) immediate release tablet 5 mg  5 mg Oral Q6H PRN Opyd, Ilene Qua, MD   5 mg at 03/12/20 1208  . senna-docusate (Senokot-S) tablet 1 tablet  1 tablet Oral QHS PRN Opyd, Ilene Qua, MD      . sertraline (ZOLOFT) tablet 50 mg  50 mg Oral Daily Opyd, Ilene Qua, MD   50 mg at 03/12/20 0933  .  simvastatin (ZOCOR) tablet 20 mg  20 mg Oral Daily Opyd, Ilene Qua, MD   20 mg at 03/12/20 0933  . umeclidinium bromide (INCRUSE ELLIPTA) 62.5 MCG/INH 1 puff  1 puff Inhalation Daily Opyd, Ilene Qua, MD   1 puff at 03/12/20 0071     Discharge Medications: Please see discharge summary for a list of discharge medications.  Relevant Imaging Results:  Relevant Lab Results:   Additional Information ssn:[id number]  Lia Hopping, LCSW

## 2020-03-12 NOTE — Plan of Care (Signed)

## 2020-03-12 NOTE — Evaluation (Signed)
Physical Therapy Evaluation Patient Details Name: [name] MRN: [id number] DOB: 24-Sep-1955 Today's Date: 03/12/2020   History of Present Illness  [name] is a 65 y.o. male with medical history significant for COPD, OSA with CPAP intolerance, chronic hypoxic respiratory failure, chronic kidney disease 3B, insulin-dependent diabetes mellitus, hypertension, depression, anxiety, and chronic pain, who presents to the emergency department with general weakness and debility, and was unable to get up after sliding to the floor.  Patient reports that he was seen in the emergency department yesterday after a fall onto his left side, was found to have fracture involving the proximal left humerus, was discharged with orthopedic follow-up, but then slid to the ground tonight, was unable to get up with assistance from his wife, Patient found to have left sided rib fractures.  Clinical Impression  The patient reports that he wants to return home. Patient requiring extensive assistance with bed mobility, 2 person assist to take a few steps with 1 UE support. Patient reports L shoulder and hand pain, has been medicated. Patient will benefit  From post acute rehab to return to mod I level.  Pt admitted with above diagnosis.  Pt currently with functional limitations due to the deficits listed below (see PT Problem List). Pt will benefit from skilled PT to increase their independence and safety with mobility to allow discharge to the venue listed below.       Follow Up Recommendations SNF    Equipment Recommendations  None recommended by PT    Recommendations for Other Services       Precautions / Restrictions Precautions Precautions: Shoulder;Fall Type of Shoulder Precautions: No AROM, NO PROM Shoulder Interventions: Shoulder sling/immobilizer;At all times Restrictions Weight Bearing Restrictions: Yes LUE Weight Bearing: Non weight bearing      Mobility  Bed Mobility Overal bed mobility:  Needs Assistance Bed Mobility: Supine to Sit     Supine to sit: Max assist;HOB elevated     General bed mobility comments: patient  moved legs to bed edge. patient required max assistance to pull to sitting, even after HOB raised.    Transfers Overall transfer level: Needs assistance Equipment used: None;Rolling walker (2 wheeled) Transfers: Sit to/from [facility] Sit to Stand: Mod assist         General transfer comment: Mod assist to stand with LUE on RW, Therapist holding left side of RW down. patient took small steps  forward then backward to recliner.  Ambulation/Gait             General Gait Details: tba  Stairs            Wheelchair Mobility    Modified Rankin (Stroke Patients Only)       Balance Overall balance assessment: Needs assistance Sitting-balance support: Feet supported;Single extremity supported Sitting balance-Leahy Scale: Fair     Standing balance support: During functional activity;Single extremity supported Standing balance-Leahy Scale: Poor Standing balance comment: reliant  on UE support and external support                             Pertinent Vitals/Pain Pain Score: 6  Pain Location: L arm/hand Pain Descriptors / Indicators: Sharp Pain Intervention(s): Limited activity within patient's tolerance;Monitored during session;Premedicated before session;Patient requesting pain meds-RN notified;Repositioned    Home Living Family/patient expects to be discharged to:: Private residence Living Arrangements: Spouse/significant other Available Help at Discharge: Family Type of Home: House Home Access: Stairs to  enter Entrance Stairs-Rails: Can reach both Entrance Stairs-Number of Steps: 2 Home Layout: Multi-level;Bed/bath upstairs Home Equipment: Wheelchair - manual Additional Comments: on home O2 3 L    Prior Function Level of Independence: Independent         Comments: Pt is retired; He and  wife have custody of 2 yo grandson     Hand Dominance   Dominant Hand: Left    Extremity/Trunk Assessment   Upper Extremity Assessment Upper Extremity Assessment: Defer to OT evaluation    Lower Extremity Assessment Lower Extremity Assessment: Generalized weakness    Cervical / Trunk Assessment Cervical / Trunk Assessment: Normal (guarded trunk movements, left rib fxs)  Communication   Communication: HOH  Cognition Arousal/Alertness: Awake/alert Behavior During Therapy: WFL for tasks assessed/performed Overall Cognitive Status: No family/caregiver present to determine baseline cognitive functioning Area of Impairment: Orientation                 Orientation Level: Time             General Comments: slow processing at times, when asked what he needs to be able to do to go home, stated" You will have to ask my wife"      General Comments      Exercises     Assessment/Plan    PT Assessment Patient needs continued PT services  PT Problem List Decreased strength;Decreased mobility;Decreased knowledge of precautions;Decreased activity tolerance;Decreased balance;Decreased knowledge of use of DME;Pain       PT Treatment Interventions DME instruction;Therapeutic activities;Gait training;Therapeutic exercise;Patient/family education;Functional mobility training    PT Goals (Current goals can be found in the Care Plan section)  Acute Rehab PT Goals Patient Stated Goal: go home PT Goal Formulation: With patient Time For Goal Achievement: 03/26/20 Potential to Achieve Goals: Fair    Frequency Min 2X/week   Barriers to discharge        Co-evaluation               AM-PAC PT "6 Clicks" Mobility  Outcome Measure Help needed turning from your back to your side while in a flat bed without using bedrails?: A Lot Help needed moving from lying on your back to sitting on the side of a flat bed without using bedrails?: A Lot Help needed moving to and from a  bed to a chair (including a wheelchair)?: A Lot Help needed standing up from a chair using your arms (e.g., wheelchair or bedside chair)?: A Lot Help needed to walk in hospital room?: A Lot Help needed climbing 3-5 steps with a railing? : Total 6 Click Score: 11    End of Session Equipment Utilized During Treatment: Gait belt Activity Tolerance: Patient limited by pain Patient left: in chair;with call bell/phone within reach;with bed alarm set Nurse Communication: Mobility status PT Visit Diagnosis: Unsteadiness on feet (R26.81);History of falling (Z91.81);Difficulty in walking, not elsewhere classified (R26.2);Pain Pain - Right/Left: Left Pain - part of body: Shoulder;Hand    Time: 4854-6270 PT Time Calculation (min) (ACUTE ONLY): 38 min   Charges:   PT Evaluation $PT Eval Low Complexity: 1 Low PT Treatments $Therapeutic Activity: 23-37 mins        Tresa Endo PT Acute Rehabilitation Services Pager [phone] Office [phone]   Claretha Cooper 03/12/2020, 2:02 PM

## 2020-03-12 NOTE — Care Management Important Message (Signed)
Important Message  Patient Details IM Letter given to the Patient Name: [name] MRN: [id number] Date of Birth: 06/07/1955   Medicare Important Message Given:  Yes     Kerin Salen 03/12/2020, 12:02 PM

## 2020-03-12 NOTE — Progress Notes (Signed)
Subjective:    [name] a 65 y.o.malewith medical history significant forCOPD, OSA with CPAP intolerance, chronic hypoxic respiratory failure(on 3.5L of O2), CKD3B,DM type 1,HTN, depression, anxiety, and chronic pain, who presents to theEDwith general weakness and debility, and was unable to get up after sliding to the floor. Patient reports that he was seen in theED on 03/09/20,after a fall onto his left side, was found to have fracture involving the proximal left humerus, was discharged with orthopedic follow-up.Patient reports that he did hit his head when he fell on 03/09/2020 but denies any loss of consciousness, headache, or focal numbness or weakness. He fell onto his left side and and has been experiencing pain in his upper left arm, and to lesser extent, the left hip and left flank. Patient's wife was concerned that he has become progressively weak in general over the past 2 years and she is unable to care for him at home any longer due to his profound debility. He does not normally use a cane or walker. He reports smoking 7 to 8 cigarettes/day and is actively working on cutting back with a goal of quitting again.Oxygen dependent. In the ED,saturating mid to upper 90s on his usual supplemental oxygen, and with stable blood pressure. X-ray ribs demonstrate acute minimally displaced left 7 and 8 rib fractures.Pt admitted for further management. Ortho asked to consult in regards to  Humerus fracture  Objective: Vital signs in last 24 hours: Temp:  [98.3 F (36.8 C)-98.6 F (37 C)] 98.4 F (36.9 C) (02/25 0537) Pulse Rate:  [28-62] 62 (02/25 0537) Resp:  [14-18] 14 (02/25 0537) BP: (110-150)/(38-96) 129/48 (02/25 0537) SpO2:  [84 %-100 %] 91 % (02/25 0748)  Intake/Output from previous day: 02/24 0701 - 02/25 0700 In: 1488.2 [P.O.:200; I.V.:1288.2] Out: 350 [Urine:350] Intake/Output this shift: Total I/O In: 117 [IV Piggyback:117] Out: 200 [Urine:200]  Recent Labs     03/10/20 2136 03/11/20 1330 03/12/20 0331  HGB 9.5* 7.8* 7.3*   Recent Labs    03/11/20 1330 03/12/20 0331  WBC 4.2 3.6*  RBC 2.76*  2.73* 2.58*  HCT 24.7* 23.3*  PLT 109* 120*   Recent Labs    03/11/20 1330 03/12/20 0331  NA 141 140  K 3.6 4.1  CL 105 106  CO2 27 27  BUN 48* 46*  CREATININE 2.01* 1.97*  GLUCOSE 135* 87  CALCIUM 7.6* 8.2*   No results for input(s): LABPT, INR in the last 72 hours.  Body mass index is 21.86 kg/m. A+O x3 No SOB at present.  On supplemental O2  Complains of pain with palpation over the ribs bilaterally. Abd: soft/NT.  No rebount tenderness Left UE: sensation to light touch is intact.  No focal motor deficits in the extremity on exam.  No ecchymosis, postivie pain and mild swelling over the left shoulder.  Sling in place No pain with PROM or palpation of the right UE/bilateral LE's. Compartments soft/NT  Imaging:  DG Ribs Unilateral W/Chest Left  Result Date: 03/10/2020 CLINICAL DATA:  Fall with back pain EXAM: LEFT RIBS AND CHEST - 3+ VIEW COMPARISON:  12/16/2019 FINDINGS: Single-view chest demonstrates no focal opacity or pleural effusion. Normal cardiomediastinal silhouette. No pneumothorax. Left rib series demonstrates acute minimally displaced left seventh and eighth posterolateral rib fractures. There is an acute comminuted proximal left humerus fracture. IMPRESSION: Acute comminuted proximal left humerus fracture. Acute minimally displaced left seventh and eighth posterolateral rib fractures. No pneumothorax. Electronically Signed   By: Madie Reno.D.  On: 03/10/2020 22:44   DG Lumbar Spine Complete  Result Date: 03/10/2020 CLINICAL DATA:  Fall with back pain EXAM: LUMBAR SPINE - COMPLETE 4+ VIEW COMPARISON:  08/23/2018 FINDINGS: Mild dextroscoliosis of the spine. Lateral view is limited by oblique positioning. Vertebral body heights are grossly maintained. Moderate multilevel degenerative change with disc space narrowing  and osteophyte. Aortic atherosclerosis. IMPRESSION: Limited by positioning. Moderate multilevel degenerative change. No definite acute osseous abnormality. Electronically Signed   By: Donavan Foil M.D.   On: 03/10/2020 22:40   DG Elbow Complete Left  Result Date: 03/09/2020 CLINICAL DATA:  Fall with shoulder pain EXAM: LEFT ELBOW - COMPLETE 3+ VIEW COMPARISON:  None. FINDINGS: There is no evidence of fracture, dislocation, or joint effusion. There is no evidence of arthropathy or other focal bone abnormality. Soft tissues are unremarkable. IMPRESSION: Negative. Electronically Signed   By: Donavan Foil M.D.   On: 03/09/2020 21:56   DG Wrist Complete Left  Result Date: 03/09/2020 CLINICAL DATA:  Fall with wrist pain EXAM: LEFT WRIST - COMPLETE 3+ VIEW COMPARISON:  None. FINDINGS: No fracture or malalignment. Mild degenerative changes at the distal radioulnar joint, first MCP joint and STT interval. Soft tissues are unremarkable IMPRESSION: No acute osseous abnormality. Electronically Signed   By: Donavan Foil M.D.   On: 03/09/2020 21:57   CT Head Wo Contrast  Result Date: 03/10/2020 CLINICAL DATA:  Head trauma EXAM: CT HEAD WITHOUT CONTRAST TECHNIQUE: Contiguous axial images were obtained from the base of the skull through the vertex without intravenous contrast. 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 No gross acute territorial infarction, hemorrhage or intracranial mass is visualized. Mild periventricular Griesinger matter hypodensity likely chronic small vessel ischemic change. Mild atrophy. Stable ventricle size. Vascular: No hyperdense vessels.  Carotid vascular calcification Skull: No fracture Sinuses/Orbits: No acute finding. Other: Small left mastoid effusion IMPRESSION: 1. 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 No gross CT evidence for acute intracranial abnormality. 2. Atrophy and mild chronic small  vessel ischemic changes of the Denn matter. Electronically Signed   By: Donavan Foil M.D.   On: 03/10/2020 22:48   US RENAL  Result Date: 03/11/2020 CLINICAL DATA:  Acute renal failure. EXAM: RENAL / URINARY TRACT ULTRASOUND COMPLETE COMPARISON:  March 31, 2019 FINDINGS: Right Kidney: Renal measurements: 9.8 cm x 4.3 cm x 4.6 cm = volume: 99.9 mL. Echogenicity within normal limits. No mass or hydronephrosis visualized. Left Kidney: Renal measurements: 10.6 cm x 5.6 cm x 5.0 cm = volume: 155.2 mL. Echogenicity within normal limits. No mass or hydronephrosis visualized. Bladder: Appears normal for degree of bladder distention. Other: None. IMPRESSION: Normal renal ultrasound. Electronically Signed   By: Virgina Norfolk M.D.   On: 03/11/2020 00:47   DG Shoulder Left  Result Date: 03/09/2020 CLINICAL DATA:  Fall with shoulder pain EXAM: LEFT SHOULDER - 2+ VIEW COMPARISON:  08/23/2018 FINDINGS: AC joint is intact. There is calcific tendinosis at the humeral head. Acute comminuted and mildly displaced fracture involving left humeral neck and greater tuberosity. No humeral head dislocation. Slight inferior subluxation of the humeral head potentially due to effusion IMPRESSION: Acute comminuted and mildly displaced fracture involving the left humeral neck and greater tuberosity. Mild inferior subluxation of humeral head with respect to glenoid fossa but no displacement in anterior to posterior direction on Y-view. Question shoulder effusion. There is calcific tendinosis. Electronically Signed   By: Madie Reno.D.  On: 03/09/2020 21:55   DG Humerus Left  Result Date: 03/09/2020 CLINICAL DATA:  Fall with arm pain EXAM: LEFT HUMERUS - 2+ VIEW COMPARISON:  None. FINDINGS: Incompletely visualized comminuted fracture involving proximal humerus. The mid to distal portion of the humerus shows no fracture or malalignment IMPRESSION: Incompletely visualized comminuted proximal humerus fracture.  Electronically Signed   By: Donavan Foil M.D.   On: 03/09/2020 21:53   CT CHEST LUNG CA SCREEN LOW DOSE W/O CM  Result Date: 03/02/2020 CLINICAL DATA:  Fifty pack-year smoking history.  Current smoker. EXAM: CT CHEST WITHOUT CONTRAST LOW-DOSE FOR LUNG CANCER SCREENING TECHNIQUE: Multidetector CT imaging of the chest was performed following the standard protocol without IV contrast. COMPARISON:  12/16/2019 chest radiograph. No prior CT. FINDINGS: Cardiovascular: Aortic atherosclerosis. Normal heart size, without pericardial effusion. Multivessel coronary artery atherosclerosis. Mediastinum/Nodes: Borderline precarinal node of 1.0 cm on 24/2. Multiple prevascular nodes including at up to 8 mm on 23/2. Hilar regions poorly evaluated without intravenous contrast. Lungs/Pleura: No pleural fluid. Moderate centrilobular emphysema. Left lower lobe scarring. Bilateral pulmonary nodules of maximally volume derived equivalent diameter 2.8 mm. A left upper lobe nodule, superimposed upon branching vessels, measures volume derived equivalent diameter 8.8 mm, including on 106/9. Upper Abdomen: Normal imaged portions of the liver, spleen, stomach, kidneys. Left greater than right adrenal nodularity. The left adrenal nodule measures 1.4 cm and has indeterminate density measurements on 58/2. Musculoskeletal: Mild bilateral gynecomastia. Advanced thoracic spondylosis. IMPRESSION: 1. Lung-RADS 4A, suspicious. Follow up low-dose chest CT without contrast in 3 months (please use the following order, "CT CHEST LCS NODULE FOLLOW-UP W/O CM") is recommended. Alternatively, PET may be considered when there is a solid component 28mm or larger. Left upper lobe pulmonary nodule of volume derived equivalent diameter 8.8 mm. 2. Borderline to mild middle and anterior mediastinal adenopathy, indeterminate. Recommend attention on follow-up. 3. Aortic atherosclerosis (ICD10-I70.0), coronary artery atherosclerosis and emphysema (ICD10-J43.9). 4.  Indeterminate left adrenal nodule. This could also be re-evaluated on follow-up screening CT or PET. 5. Mild bilateral gynecomastia. These results will be called to the ordering clinician or representative by the Radiologist Assistant, and communication documented in the PACS or [employer]. Electronically Signed   By: Abigail Miyamoto M.D.   On: 03/02/2020 09:21   DG Hip Unilat W or Wo Pelvis 2-3 Views Left  Result Date: 03/10/2020 CLINICAL DATA:  Fall EXAM: DG HIP (WITH OR WITHOUT PELVIS) 2-3V LEFT COMPARISON:  08/23/2018 FINDINGS: SI joints are non widened. Pubic symphysis and rami appear intact. No fracture or malalignment. Mild degenerative changes of the hips. IMPRESSION: No acute osseous abnormality. Electronically Signed   By: Donavan Foil M.D.   On: 03/10/2020 22:41   Assessment/Plan:    CT scan of left humerus fracture reviewed with Dr. Rolena Infante and Dr. Stann Mainland who does not think surgical intervention is required. Orthopedics will sign off and patient can follow-up as an outpatient with Dr. Stann Mainland in 1 week.    [name] 03/12/2020, 11:43 AM

## 2020-03-12 NOTE — TOC Initial Note (Addendum)
Transition of Care [facility]) - Initial/Assessment Note    Patient Details  Name: [name] MRN: [id number] Date of Birth: 10-11-1955  [facility] CM/SW Contact:    Lia Hopping, [place] Phone Number: 03/12/2020, 1:25 PM  Clinical Narrative:   Patient admitted after a fall onto his left side. He was found to have a fracture involving the proximal left humerus. Patient found to have left sided rib fractures.               CSW introduced role and reason for the visit to the patient and his spouse. Spouse reports the patient will indeed need SNF rehab before returning home. She reports they live in split level home and it is difficult for the patient to navigate the stairs without support. She reports it has been challenging caring for the patient at home. Spouse reports she has been trying to raise a toddler in foster care while caring for her husband. She reports when the patient falls he tends to go "rag doll" and she cannot safely transfer him from the floor alone. She reports the patient has been on disability since 2004 and has profound hearing loss. She reports since the pandemic started in 2020 the patient health has declined evenmore. She reports the patient developed covid symptoms in early 2020 and was never properly diagnosed. She feels the patient has lingering side effects from covid such as memory loss, edema in legs and now cannot balance well without support. She reports the patient sleeps very frequently. She hopes the patient rehab stay will allow the patient to regain his strength and return home be self-sufficient again.  CSW explain to the spouse the SNF process and answered her questions. She reports [facility] SNF will be her first choice. CSW will follow up with bed offers when available. Spouse reports the patient current [facility] supplement plan has been changed to [facility].   Patient vaccinated and boosted.  FL2 completed.   Per chart review, the patient is  currently taking Methadone for chronic pain. CSW inform her the spouse this may be a barrier to SNF's because many of the physicians do not carry a license to administer the methadone. She reports the patient is active with a chronic pain clinic where they write prescriptions for the methadone.   CSW reached out to SNF [facility] to determine bed availability.   TOC will continue to follow this patient and coordinate placement needs.   3:45PM Addendum: [facility] will not have bed available until late March. CSW notified patient spouse. Weekend [facility] staff will follow up with bed offers.   Expected Discharge Plan: [place] Barriers to Discharge: Continued Medical Work up   Patient Goals and CMS Choice Patient states their goals for this hospitalization and ongoing recovery are:: to go rehab      Expected Discharge Plan and Services Expected Discharge Plan: [place] In-house Referral: Clinical Social Work Discharge Planning Services: CM Consult Post Acute Care Choice: [place] Living arrangements for the past 2 months: [place]                                      Prior Living Arrangements/Services Living arrangements for the past 2 months: Single Family Home Lives with:: Spouse Patient language and need for interpreter reviewed:: No Do you feel safe going back to the place where you  live?: Yes      Need for Family Participation in Patient Care: Yes (Comment) Care giver support system in place?: Yes (comment) Current home services: DME Criminal Activity/Legal Involvement Pertinent to Current Situation/Hospitalization: No - Comment as needed  Activities of Daily Living Home Assistive Devices/Equipment: Medical sales representative (specify type),CBG Meter,Other (Comment) (walk-in shower, handicap toilet height, front wheeled walker, left hearing implant) ADL Screening (condition at time of admission) Patient's  cognitive ability adequate to safely complete daily activities?: Yes Is the patient deaf or have difficulty hearing?: Yes (has left hearing implant) Does the patient have difficulty seeing, even when wearing glasses/contacts?: No Does the patient have difficulty concentrating, remembering, or making decisions?: No Patient able to express need for assistance with ADLs?: Yes Does the patient have difficulty dressing or bathing?: Yes Independently performs ADLs?: No Communication: Independent Dressing (OT): Needs assistance Is this a change from baseline?: Change from baseline, expected to last >3 days Grooming: Independent Feeding: Needs assistance Is this a change from baseline?: Change from baseline, expected to last >3 days Bathing: Needs assistance Is this a change from baseline?: Change from baseline, expected to last >3 days Toileting: Needs assistance Is this a change from baseline?: Change from baseline, expected to last >3days In/Out Bed: Needs assistance Is this a change from baseline?: Change from baseline, expected to last >3 days Walks in Home: Needs assistance Is this a change from baseline?: Change from baseline, expected to last >3 days Does the patient have difficulty walking or climbing stairs?: Yes (secondary to worsening weakness) Weakness of Legs: Both Weakness of Arms/Hands: None  Permission Sought/Granted Permission sought to share information with : Family Supports Permission granted to share information with : Yes, Release of Information Signed  Share Information with NAME: [place] [phone]  Permission granted to share info w AGENCY: [place] in the area.  Permission granted to share info w Relationship: Spouse     Emotional Assessment Appearance:: Appears stated age Attitude/Demeanor/Rapport: Engaged Affect (typically observed): Accepting Orientation: : Oriented to Self,Oriented to Place,Oriented to  Time,Oriented to  Situation Alcohol / Substance Use: Not Applicable Psych Involvement: No (comment)  Admission diagnosis:  Rib fractures [S22.49XA] AKI (acute kidney injury) ([place]) [N17.9] Fall, initial encounter [W19.XXXA] Closed fracture of multiple ribs of left side, initial encounter [S22.42XA] Closed fracture of proximal end of left humerus with routine healing, unspecified fracture morphology, subsequent encounter [S42.202D] Acute renal failure superimposed on stage 3b chronic kidney disease ([place]) [N17.9, N18.32] Patient Active Problem List   Diagnosis Date Noted  . Rib fractures 03/10/2020  . Closed fracture of proximal end of left humerus 03/10/2020  . Normocytic anemia 03/10/2020  . Thrombocytopenia ([place]) 03/10/2020  . COPD, severe ([place]) 12/17/2019  . Current smoker 12/17/2019  . Acute encephalopathy 03/31/2019  . AMS (altered mental status) 03/30/2019  . Cellulitis of lower extremity 06/01/2018  . Cellulitis of both lower extremities 05/31/2018  . Chronic respiratory failure with hypoxia ([place]) 05/31/2018  . HLD (hyperlipidemia) 05/31/2018  . Acute renal failure superimposed on stage 3b chronic kidney disease ([place]) 05/31/2018  . Depression with anxiety 05/31/2018  . Chronic pain 05/31/2018  . Acute respiratory failure with hypoxia and hypercapnia ([place]) 04/14/2018  . Diabetes mellitus type 1 with manifestations ([place]) 04/14/2018  . Postop check 10/31/2017  . Central sleep apnea 10/14/2017  . Insomnia secondary to chronic pain 08/01/2017  . Contact with and (suspected) exposure to environmental tobacco smoke (acute) (chronic) 08/01/2017  . Snoring 08/01/2017  . Venous insufficiency (chronic) (peripheral)  10/04/2016  . Essential hypertension 08/01/2013  . Chronic ulcer of left ankle ([place]) 08/01/2013  . Type I (juvenile type) diabetes mellitus with neurological manifestations, not stated as uncontrolled(250.61) 10/03/2012  . Healthcare-associated pneumonia 10/03/2012  . Dyslipidemia  10/03/2012  . Insomnia 10/03/2012  . Pain in joint, lower leg 10/02/2012  . Special screening for malignant neoplasms, colon 07/26/2010   PCP:  Leanna Battles, MD Pharmacy:   [facility] [address], [place] [address] [place] [address] Phone: [phone] Fax: [phone]  Pharmacy, [place], [place] [address] [place] [address] Phone: [phone] Fax: [phone]     Social Determinants of Health (SDOH) Interventions    Readmission Risk Interventions No flowsheet data found.

## 2020-03-13 ENCOUNTER — Encounter (HOSPITAL_COMMUNITY): Payer: Self-pay | Admitting: Family Medicine

## 2020-03-13 DIAGNOSIS — S2242XA Multiple fractures of ribs, left side, initial encounter for closed fracture: Secondary | ICD-10-CM | POA: Diagnosis not present

## 2020-03-13 DIAGNOSIS — J9611 Chronic respiratory failure with hypoxia: Secondary | ICD-10-CM | POA: Diagnosis not present

## 2020-03-13 DIAGNOSIS — N179 Acute kidney failure, unspecified: Secondary | ICD-10-CM | POA: Diagnosis not present

## 2020-03-13 DIAGNOSIS — G8929 Other chronic pain: Secondary | ICD-10-CM | POA: Diagnosis not present

## 2020-03-13 LAB — TYPE AND SCREEN
ABO/RH(D): A POS
Antibody Screen: NEGATIVE

## 2020-03-13 LAB — CBC
HCT: 25.1 % — ABNORMAL LOW (ref 39.0–52.0)
Hemoglobin: 8.2 g/dL — ABNORMAL LOW (ref 13.0–17.0)
MCH: 28.8 pg (ref 26.0–34.0)
MCHC: 32.7 g/dL (ref 30.0–36.0)
MCV: 88.1 fL (ref 80.0–100.0)
Platelets: 148 10*3/uL — ABNORMAL LOW (ref 150–400)
RBC: 2.85 MIL/uL — ABNORMAL LOW (ref 4.22–5.81)
RDW: 13.2 % (ref 11.5–15.5)
WBC: 3.8 10*3/uL — ABNORMAL LOW (ref 4.0–10.5)
nRBC: 0 % (ref 0.0–0.2)

## 2020-03-13 LAB — BASIC METABOLIC PANEL
Anion gap: 9 (ref 5–15)
BUN: 35 mg/dL — ABNORMAL HIGH (ref 8–23)
CO2: 28 mmol/L (ref 22–32)
Calcium: 8.6 mg/dL — ABNORMAL LOW (ref 8.9–10.3)
Chloride: 104 mmol/L (ref 98–111)
Creatinine, Ser: 1.5 mg/dL — ABNORMAL HIGH (ref 0.61–1.24)
GFR, Estimated: 51 mL/min — ABNORMAL LOW (ref >60)
Glucose, Bld: 138 mg/dL — ABNORMAL HIGH (ref 70–99)
Potassium: 3.7 mmol/L (ref 3.5–5.1)
Sodium: 141 mmol/L (ref 135–145)

## 2020-03-13 LAB — ABO/RH: ABO/RH(D): A POS

## 2020-03-13 LAB — GLUCOSE, CAPILLARY
Glucose-Capillary: 93 mg/dL (ref 70–99)
Glucose-Capillary: 98 mg/dL (ref 70–99)
Glucose-Capillary: 91 mg/dL (ref 70–99)
Glucose-Capillary: 141 mg/dL — ABNORMAL HIGH (ref 70–99)

## 2020-03-13 MED ORDER — METOPROLOL TARTRATE 25 MG PO TABS
25.0000 mg | ORAL_TABLET | Freq: Every day | ORAL | Status: DC
  Administered 2020-03-13: 25 mg via ORAL
  Filled 2020-03-13: qty 1

## 2020-03-13 MED ORDER — METOPROLOL TARTRATE 25 MG PO TABS
25.0000 mg | ORAL_TABLET | Freq: Two times a day (BID) | ORAL | Status: DC
  Administered 2020-03-13: 25 mg via ORAL
  Filled 2020-03-13: qty 1

## 2020-03-13 NOTE — Progress Notes (Signed)
PROGRESS NOTE  [name] AYT:[id number] DOB: [birth year] DOA: 03/10/2020 PCP: Leanna Battles, MD  HPI/Recap of past 24 hours: HPI from Dr Stefan Church is a 65 y.o. male with medical history significant for COPD, OSA with CPAP intolerance, chronic hypoxic respiratory failure (on 3.5L of O2), CKD 3B, DM type 1, HTN, depression, anxiety, and chronic pain, who presents to the ED with general weakness and debility, and was unable to get up after sliding to the floor. Patient reports that he was seen in the ED on 03/09/20, after a fall onto his left side, was found to have fracture involving the proximal left humerus, was discharged with orthopedic follow-up. Patient reports that he did hit his head when he fell on 03/09/2020 but denies any loss of consciousness, headache, or focal numbness or weakness.  He fell onto his left side and and has been experiencing pain in his upper left arm, and to lesser extent, the left hip and left flank. Patient's wife was concerned that he has become progressively weak in general over the past 2 years and she is unable to care for him at home any longer due to his profound debility.  He does not normally use a cane or walker.  He reports smoking 7 to 8 cigarettes/day and is actively working on cutting back with a goal of quitting again. Oxygen dependent. In the ED, saturating mid to upper 90s on his usual supplemental oxygen, and with stable blood pressure.  Chemistry panel is notable for BUN of 58 and creatinine 2.77, up from 1.6 in November. Head CT negative for acute intracranial normality. X-ray ribs demonstrate acute minimally displaced left 7 and 8 rib fractures. Pt admitted for further management     Today, patient continues to complain of left upper extremity and left-sided rib pain. Denies any other new complaints.    Assessment/Plan: Principal Problem:   Rib fractures Active Problems:   Essential hypertension   Diabetes mellitus type 1 with  manifestations (HCC)   Chronic respiratory failure with hypoxia (HCC)   Acute renal failure superimposed on stage 3b chronic kidney disease (HCC)   Depression with anxiety   Chronic pain   COPD, severe (HCC)   Closed fracture of proximal end of left humerus   Normocytic anemia   Thrombocytopenia (HCC)   Debility; falls; proximal left humerus fracture; rib fractures  Continue left arm sling, pulmonary toilet, pain-control For left humerus fracture, [facility] consulted, recommend no surgical intervention and to follow-up as an outpatient with Dr. Stann Mainland in 1 week PT/OT-recommend SNF  AKI on CKD IIIb  Improving SCr is 2.77 on admission, up from apparent baseline of ~1.6  Renal ultrasound normal Hold losartan and Lasix S/P IVF, renally-dose medications    COPD with chronic hypoxic respiratory failure  Stable, continue supplemental O2, ICS/LAMA/LABA, and prn SABA    Hypertension   Stable Hold losartan, Lasix (due to AKI) Continue Norvasc, metoprolol  Type 1 DM, controlled A1c 6.6  SSI, Lantus, Accu-Cheks, hypoglycemic protocol  Anemia of chronic kidney disease/iron deficiency anemia Thrombocytopenia  Anemia panel showed iron 17, saturation 10, vitamin B12 350 Received 1 dose of Feraheme on 03/12/2020 Start oral iron and vitamin B12 supplements Will need outpatient screening colonoscopy, no record on file Daily CBC    Depression, anxiety  Continue home regimen   Chronic pain syndrome Continue methadone  Will need short acting opiates briefly due to acute fractures     Estimated body mass index is 22.01 kg/m as  calculated from the following:   Height as of this encounter: 5\' 9"  (1.753 m).   Weight as of this encounter: 67.6 kg.     Code Status: Full  Family Communication: None at bedside  Disposition Plan: Status is: Inpatient  Remains inpatient appropriate because:Inpatient level of care appropriate due to severity of illness   Dispo: The patient  is from: Home              Anticipated d/c is to: SNF              Patient currently is medically stable to d/c.   Difficult to place patient No    Consultants:  Orthopedics  Procedures:  None  Antimicrobials:  None  DVT prophylaxis: Heparin [place]   Objective: Vitals:   03/12/20 1326 03/12/20 2128 03/13/20 0508 03/13/20 0841  BP: (!) 143/66 (!) 186/63 (!) 159/80   Pulse: 82 96 79 88  Resp: 18 20 16    Temp: 98.3 F (36.8 C) 99 F (37.2 C) 97.9 F (36.6 C)   TempSrc: Oral     SpO2: (!) 79% (!) 80% 94% 90%  Weight:   67.6 kg   Height:        Intake/Output Summary (Last 24 hours) at 03/13/2020 1312 Last data filed at 03/13/2020 1052 Gross per 24 hour  Intake 480 ml  Output 2400 ml  Net -1920 ml   Filed Weights   03/10/20 2049 03/13/20 0508  Weight: 67.1 kg 67.6 kg    Exam:  General: NAD, LUE in sling, HOH  Cardiovascular: S1, S2 present  Respiratory: CTAB  Abdomen: Soft, nontender, nondistended, bowel sounds present  Musculoskeletal: No bilateral pedal edema noted  Skin: Normal  Psychiatry: Normal mood   Data Reviewed: CBC: Recent Labs  Lab 03/10/20 2136 03/11/20 1330 03/12/20 0331 03/13/20 0259  WBC 7.8 4.2 3.6* 3.8*  NEUTROABS 6.0  --   --   --   HGB 9.5* 7.8* 7.3* 8.2*  HCT 29.3* 24.7* 23.3* 25.1*  MCV 88.0 89.5 90.3 88.1  PLT 121* 109* 120* 449*   Basic Metabolic Panel: Recent Labs  Lab 03/10/20 2136 03/11/20 1330 03/12/20 0331 03/13/20 0259  NA 135 141 140 141  K 3.7 3.6 4.1 3.7  CL 96* 105 106 104  CO2 27 27 27 28   GLUCOSE 174* 135* 87 138*  BUN 58* 48* 46* 35*  CREATININE 2.77* 2.01* 1.97* 1.50*  CALCIUM 8.4* 7.6* 8.2* 8.6*   GFR: Estimated Creatinine Clearance: 46.3 mL/min (A) (by C-G formula based on SCr of 1.5 mg/dL (H)). Liver Function Tests: No results for input(s): AST, ALT, ALKPHOS, BILITOT, PROT, ALBUMIN in the last 168 hours. No results for input(s): LIPASE, AMYLASE in the last 168 hours. No results for  input(s): AMMONIA in the last 168 hours. Coagulation Profile: No results for input(s): INR, PROTIME in the last 168 hours. Cardiac Enzymes: No results for input(s): CKTOTAL, CKMB, CKMBINDEX, TROPONINI in the last 168 hours. BNP (last 3 results) Recent Labs    12/16/19 1515  PROBNP 147.0*   HbA1C: Recent Labs    03/11/20 1330  HGBA1C 6.6*   CBG: Recent Labs  Lab 03/12/20 1118 03/12/20 1707 03/12/20 2125 03/13/20 0719 03/13/20 1132  GLUCAP 103* 108* 137* 93 98   Lipid Profile: No results for input(s): CHOL, HDL, LDLCALC, TRIG, CHOLHDL, LDLDIRECT in the last 72 hours. Thyroid Function Tests: No results for input(s): TSH, T4TOTAL, FREET4, T3FREE, THYROIDAB in the last 72 hours. Anemia Panel: Recent Labs  03/11/20 1330 03/11/20 1420  VITAMINB12  --  350  FOLATE  --  12.0  FERRITIN  --  148  TIBC  --  163*  IRON  --  17*  RETICCTPCT 1.4  --    Urine analysis:    Component Value Date/Time   COLORURINE YELLOW 03/10/2020 2155   APPEARANCEUR CLEAR 03/10/2020 2155   LABSPEC 1.016 03/10/2020 2155   PHURINE 5.0 03/10/2020 2155   GLUCOSEU NEGATIVE 03/10/2020 2155   HGBUR NEGATIVE 03/10/2020 2155   BILIRUBINUR NEGATIVE 03/10/2020 2155   [place] NEGATIVE 03/10/2020 2155   PROTEINUR 100 (A) 03/10/2020 2155   NITRITE NEGATIVE 03/10/2020 2155   LEUKOCYTESUR NEGATIVE 03/10/2020 2155   Sepsis Labs: @LABRCNTIP (procalcitonin:4,lacticidven:4)  ) Recent Results (from the past 240 hour(s))  SARS CORONAVIRUS 2 (TAT 6-24 HRS) Nasopharyngeal Nasopharyngeal Swab     Status: None   Collection Time: 03/10/20 11:40 PM   Specimen: Nasopharyngeal Swab  Result Value Ref Range Status   SARS Coronavirus 2 NEGATIVE NEGATIVE Final    Comment: (NOTE) SARS-CoV-2 target nucleic acids are NOT DETECTED.  The SARS-CoV-2 RNA is generally detectable in upper and lower respiratory specimens during the acute phase of infection. Negative results do not preclude SARS-CoV-2 infection, do not  rule out co-infections with other pathogens, and should not be used as the sole basis for treatment or other patient management decisions. Negative results must be combined with clinical observations, patient history, and epidemiological information. The expected result is Negative.  Fact Sheet for Patients: SugarRoll.be  Fact Sheet for Healthcare Providers: https://www.woods-mathews.com/  This test is not yet approved or cleared by the [place] FDA and  has been authorized for detection and/or diagnosis of SARS-CoV-2 by FDA under an Emergency Use Authorization (EUA). This EUA will remain  in effect (meaning this test can be used) for the duration of the COVID-19 declaration under Se ction 564(b)(1) of the Act, 21 U.S.C. section 360bbb-3(b)(1), unless the authorization is terminated or revoked sooner.  Performed at [facility], [place] [address] [place], [place] [address]       Studies: No results found.  Scheduled Meds: . amLODipine  5 mg Oral BID  . aspirin EC  81 mg Oral Daily  . divalproex  250 mg Oral Daily  . doxepin  25 mg Oral QHS  . ferrous sulfate  325 mg Oral Q breakfast  . fluticasone furoate-vilanterol  1 puff Inhalation Daily  . gabapentin  300 mg Oral BID  . heparin  5,000 Units Subcutaneous Q8H  . insulin aspart  0-5 Units Subcutaneous QHS  . insulin aspart  0-9 Units Subcutaneous TID WC  . insulin glargine  7 Units Subcutaneous QHS  . methadone  10 mg Oral q1600  . methadone  20 mg Oral QAC breakfast  . metoprolol tartrate  25 mg Oral Daily  . sertraline  50 mg Oral Daily  . simvastatin  20 mg Oral Daily  . umeclidinium bromide  1 puff Inhalation Daily  . vitamin B-12  1,000 mcg Oral Daily    Continuous Infusions:    LOS: 3 days     Alma Friendly, MD [facility]  If 7PM-7AM, please contact night-coverage [email or web address] 03/13/2020, 1:12 PM

## 2020-03-14 DIAGNOSIS — G8929 Other chronic pain: Secondary | ICD-10-CM | POA: Diagnosis not present

## 2020-03-14 DIAGNOSIS — N179 Acute kidney failure, unspecified: Secondary | ICD-10-CM | POA: Diagnosis not present

## 2020-03-14 DIAGNOSIS — J9611 Chronic respiratory failure with hypoxia: Secondary | ICD-10-CM | POA: Diagnosis not present

## 2020-03-14 DIAGNOSIS — S2242XA Multiple fractures of ribs, left side, initial encounter for closed fracture: Secondary | ICD-10-CM | POA: Diagnosis not present

## 2020-03-14 LAB — GLUCOSE, CAPILLARY
Glucose-Capillary: 56 mg/dL — ABNORMAL LOW (ref 70–99)
Glucose-Capillary: 59 mg/dL — ABNORMAL LOW (ref 70–99)
Glucose-Capillary: 65 mg/dL — ABNORMAL LOW (ref 70–99)
Glucose-Capillary: 73 mg/dL (ref 70–99)
Glucose-Capillary: 102 mg/dL — ABNORMAL HIGH (ref 70–99)
Glucose-Capillary: 122 mg/dL — ABNORMAL HIGH (ref 70–99)

## 2020-03-14 MED ORDER — AMLODIPINE BESYLATE 5 MG PO TABS
5.0000 mg | ORAL_TABLET | Freq: Every day | ORAL | Status: DC
  Administered 2020-03-15: 5 mg via ORAL
  Filled 2020-03-14: qty 1

## 2020-03-14 MED ORDER — METOPROLOL TARTRATE 12.5 MG HALF TABLET
12.5000 mg | ORAL_TABLET | Freq: Two times a day (BID) | ORAL | Status: DC
  Administered 2020-03-14 – 2020-03-15 (×3): 12.5 mg via ORAL
  Filled 2020-03-14 (×3): qty 1

## 2020-03-14 NOTE — Plan of Care (Signed)
°  Problem: Clinical Measurements: °Goal: Respiratory complications will improve °Outcome: Progressing °  °Problem: Clinical Measurements: °Goal: Cardiovascular complication will be avoided °Outcome: Progressing °  °Problem: Activity: °Goal: Risk for activity intolerance will decrease °Outcome: Progressing °  °Problem: Pain Managment: °Goal: General experience of comfort will improve °Outcome: Progressing °  °

## 2020-03-14 NOTE — Progress Notes (Signed)
PROGRESS NOTE  NIDAL RIVET LXB:[id number] DOB: 1955-08-18 DOA: 03/10/2020 PCP: Leanna Battles, MD  HPI/Recap of past 24 hours: HPI from Dr Stefan Church is a 65 y.o. male with medical history significant for COPD, OSA with CPAP intolerance, chronic hypoxic respiratory failure (on 3.5L of O2), CKD 3B, DM type 1, HTN, depression, anxiety, and chronic pain, who presents to the ED with general weakness and debility, and was unable to get up after sliding to the floor. Patient reports that he was seen in the ED on 03/09/20, after a fall onto his left side, was found to have fracture involving the proximal left humerus, was discharged with orthopedic follow-up. Patient reports that he did hit his head when he fell on 03/09/2020 but denies any loss of consciousness, headache, or focal numbness or weakness.  He fell onto his left side and and has been experiencing pain in his upper left arm, and to lesser extent, the left hip and left flank. Patient's wife was concerned that he has become progressively weak in general over the past 2 years and she is unable to care for him at home any longer due to his profound debility.  He does not normally use a cane or walker.  He reports smoking 7 to 8 cigarettes/day and is actively working on cutting back with a goal of quitting again. Oxygen dependent. In the ED, saturating mid to upper 90s on his usual supplemental oxygen, and with stable blood pressure.  Chemistry panel is notable for BUN of 58 and creatinine 2.77, up from 1.6 in November. Head CT negative for acute intracranial normality. X-ray ribs demonstrate acute minimally displaced left 7 and 8 rib fractures. Pt admitted for further management    Today, patient denies any new complaints, still complaining of left upper extremity, denies it worsening.    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)    Chronic respiratory failure with hypoxia (HCC)   Acute renal failure superimposed on stage 3b chronic kidney disease (HCC)   Depression with anxiety   Chronic pain   COPD, severe (HCC)   Closed fracture of proximal end of left humerus   Normocytic anemia   Thrombocytopenia (HCC)   Debility; falls; proximal left humerus fracture; rib fractures  Continue left arm sling, pulmonary toilet, pain-control For left humerus fracture, [facility] consulted, recommend no surgical intervention and to follow-up as an outpatient with Dr. Stann Mainland in 1 week PT/OT-recommend SNF  AKI on CKD IIIb  Improved SCr is 2.77 on admission, up from apparent baseline of ~1.6  Renal ultrasound normal Hold losartan and Lasix S/P IVF, renally-dose medications    COPD with chronic hypoxic respiratory failure  On 3.5L of O2 chronically Stable, continue supplemental O2, ICS/LAMA/LABA, and prn SABA    Hypertension   Stable Hold losartan, Lasix (due to AKI) Continue Norvasc, metoprolol  Type 1 DM, controlled A1c 6.6  Noted hypoglycemic episode on 03/14/20, lantus held SSI, Accu-Cheks, hypoglycemic protocol  Anemia of chronic kidney disease/iron deficiency anemia Thrombocytopenia  Anemia panel showed iron 17, saturation 10, vitamin B12 350 Received 1 dose of Feraheme on 03/12/2020 Continue oral iron and vitamin B12 supplements Will need repeat outpatient screening colonoscopy, last over 10 years as per wife Daily CBC    Depression, anxiety  Continue home regimen   Chronic pain syndrome Continue methadone  Continue short acting opiates briefly due to acute fractures  Estimated body mass index is 22.01 kg/m as calculated from the following:   Height as of this encounter: 5\' 9"  (1.753 m).   Weight as of this encounter: 67.6 kg.     Code Status: Full  Family Communication: Discussed with wife on 03/13/20  Disposition Plan: Status is: Inpatient  Remains inpatient appropriate  because:Inpatient level of care appropriate due to severity of illness   Dispo: The patient is from: Home              Anticipated d/c is to: SNF              Patient currently is medically stable to d/c.   Difficult to place patient No    Consultants:  Orthopedics  Procedures:  None  Antimicrobials:  None  DVT prophylaxis: Heparin [place]   Objective: Vitals:   03/14/20 0523 03/14/20 0824 03/14/20 0836 03/14/20 1353  BP: (!) 128/92   (!) 112/52  Pulse: (!) 54  63 (!) 54  Resp: 17   17  Temp: 98.2 F (36.8 C)   98.6 F (37 C)  TempSrc:    Oral  SpO2: 91% 90%  97%  Weight:      Height:        Intake/Output Summary (Last 24 hours) at 03/14/2020 1602 Last data filed at 03/14/2020 1348 Gross per 24 hour  Intake 1320 ml  Output 1900 ml  Net -580 ml   Filed Weights   03/10/20 2049 03/13/20 0508  Weight: 67.1 kg 67.6 kg    Exam:  General: NAD, LUE in sling, HOH  Cardiovascular: S1, S2 present  Respiratory: CTAB  Abdomen: Soft, nontender, nondistended, bowel sounds present  Musculoskeletal: No bilateral pedal edema noted  Skin: Normal  Psychiatry: Normal mood   Data Reviewed: CBC: Recent Labs  Lab 03/10/20 2136 03/11/20 1330 03/12/20 0331 03/13/20 0259  WBC 7.8 4.2 3.6* 3.8*  NEUTROABS 6.0  --   --   --   HGB 9.5* 7.8* 7.3* 8.2*  HCT 29.3* 24.7* 23.3* 25.1*  MCV 88.0 89.5 90.3 88.1  PLT 121* 109* 120* 951*   Basic Metabolic Panel: Recent Labs  Lab 03/10/20 2136 03/11/20 1330 03/12/20 0331 03/13/20 0259  NA 135 141 140 141  K 3.7 3.6 4.1 3.7  CL 96* 105 106 104  CO2 27 27 27 28   GLUCOSE 174* 135* 87 138*  BUN 58* 48* 46* 35*  CREATININE 2.77* 2.01* 1.97* 1.50*  CALCIUM 8.4* 7.6* 8.2* 8.6*   GFR: Estimated Creatinine Clearance: 46.3 mL/min (A) (by C-G formula based on SCr of 1.5 mg/dL (H)). Liver Function Tests: No results for input(s): AST, ALT, ALKPHOS, BILITOT, PROT, ALBUMIN in the last 168 hours. No results for input(s):  LIPASE, AMYLASE in the last 168 hours. No results for input(s): AMMONIA in the last 168 hours. Coagulation Profile: No results for input(s): INR, PROTIME in the last 168 hours. Cardiac Enzymes: No results for input(s): CKTOTAL, CKMB, CKMBINDEX, TROPONINI in the last 168 hours. BNP (last 3 results) Recent Labs    12/16/19 1515  PROBNP 147.0*   HbA1C: No results for input(s): HGBA1C in the last 72 hours. CBG: Recent Labs  Lab 03/14/20 0807 03/14/20 0808 03/14/20 0814 03/14/20 1153 03/14/20 1558  GLUCAP 59* 56* 73 102* 122*   Lipid Profile: No results for input(s): CHOL, HDL, LDLCALC, TRIG, CHOLHDL, LDLDIRECT in the last 72 hours. Thyroid Function Tests: No results for input(s): TSH, T4TOTAL, FREET4, T3FREE, THYROIDAB in the last 72 hours. Anemia Panel:  No results for input(s): VITAMINB12, FOLATE, FERRITIN, TIBC, IRON, RETICCTPCT in the last 72 hours. Urine analysis:    Component Value Date/Time   COLORURINE YELLOW 03/10/2020 2155   APPEARANCEUR CLEAR 03/10/2020 2155   LABSPEC 1.016 03/10/2020 2155   PHURINE 5.0 03/10/2020 2155   GLUCOSEU NEGATIVE 03/10/2020 2155   HGBUR NEGATIVE 03/10/2020 2155   BILIRUBINUR NEGATIVE 03/10/2020 2155   [place] NEGATIVE 03/10/2020 2155   PROTEINUR 100 (A) 03/10/2020 2155   NITRITE NEGATIVE 03/10/2020 2155   LEUKOCYTESUR NEGATIVE 03/10/2020 2155   Sepsis Labs: @LABRCNTIP (procalcitonin:4,lacticidven:4)  ) Recent Results (from the past 240 hour(s))  SARS CORONAVIRUS 2 (TAT 6-24 HRS) Nasopharyngeal Nasopharyngeal Swab     Status: None   Collection Time: 03/10/20 11:40 PM   Specimen: Nasopharyngeal Swab  Result Value Ref Range Status   SARS Coronavirus 2 NEGATIVE NEGATIVE Final    Comment: (NOTE) SARS-CoV-2 target nucleic acids are NOT DETECTED.  The SARS-CoV-2 RNA is generally detectable in upper and lower respiratory specimens during the acute phase of infection. Negative results do not preclude SARS-CoV-2 infection, do not rule  out co-infections with other pathogens, and should not be used as the sole basis for treatment or other patient management decisions. Negative results must be combined with clinical observations, patient history, and epidemiological information. The expected result is Negative.  Fact Sheet for Patients: SugarRoll.be  Fact Sheet for Healthcare Providers: https://www.woods-mathews.com/  This test is not yet approved or cleared by the [place] FDA and  has been authorized for detection and/or diagnosis of SARS-CoV-2 by FDA under an Emergency Use Authorization (EUA). This EUA will remain  in effect (meaning this test can be used) for the duration of the COVID-19 declaration under Se ction 564(b)(1) of the Act, 21 U.S.C. section 360bbb-3(b)(1), unless the authorization is terminated or revoked sooner.  Performed at [facility], [place] [address] [place], [place] [address]       Studies: No results found.  Scheduled Meds: . amLODipine  5 mg Oral BID  . aspirin EC  81 mg Oral Daily  . divalproex  250 mg Oral Daily  . doxepin  25 mg Oral QHS  . ferrous sulfate  325 mg Oral Q breakfast  . fluticasone furoate-vilanterol  1 puff Inhalation Daily  . gabapentin  300 mg Oral BID  . heparin  5,000 Units Subcutaneous Q8H  . insulin aspart  0-5 Units Subcutaneous QHS  . insulin aspart  0-9 Units Subcutaneous TID WC  . methadone  10 mg Oral q1600  . methadone  20 mg Oral QAC breakfast  . metoprolol tartrate  12.5 mg Oral BID  . sertraline  50 mg Oral Daily  . simvastatin  20 mg Oral Daily  . umeclidinium bromide  1 puff Inhalation Daily  . vitamin B-12  1,000 mcg Oral Daily    Continuous Infusions:    LOS: 4 days     Alma Friendly, MD [facility]  If 7PM-7AM, please contact night-coverage [email or web address] 03/14/2020, 4:02 PM

## 2020-03-14 NOTE — Progress Notes (Signed)
Md notified of pt low blood glucose of 65 this am. Pt had no signs of low blood glucose and it was quickly corrected with orange juice. Rn will continue to monitor.

## 2020-03-15 DIAGNOSIS — N179 Acute kidney failure, unspecified: Secondary | ICD-10-CM | POA: Diagnosis not present

## 2020-03-15 DIAGNOSIS — S2242XA Multiple fractures of ribs, left side, initial encounter for closed fracture: Secondary | ICD-10-CM | POA: Diagnosis not present

## 2020-03-15 DIAGNOSIS — G8929 Other chronic pain: Secondary | ICD-10-CM | POA: Diagnosis not present

## 2020-03-15 DIAGNOSIS — J9611 Chronic respiratory failure with hypoxia: Secondary | ICD-10-CM | POA: Diagnosis not present

## 2020-03-15 LAB — GLUCOSE, CAPILLARY
Glucose-Capillary: 116 mg/dL — ABNORMAL HIGH (ref 70–99)
Glucose-Capillary: 118 mg/dL — ABNORMAL HIGH (ref 70–99)
Glucose-Capillary: 86 mg/dL (ref 70–99)
Glucose-Capillary: 130 mg/dL — ABNORMAL HIGH (ref 70–99)

## 2020-03-15 LAB — RESP PANEL BY RT-PCR (FLU A&B, COVID) ARPGX2
Influenza A by PCR: NEGATIVE
Influenza B by PCR: NEGATIVE
SARS Coronavirus 2 by RT PCR: NEGATIVE

## 2020-03-15 MED ORDER — OXYCODONE HCL 5 MG PO TABS: 5.0000 mg | ORAL_TABLET | Freq: Four times a day (QID) | ORAL | 0 refills | Status: AC | PRN

## 2020-03-15 MED ORDER — FERROUS SULFATE 325 (65 FE) MG PO TABS: 325.0000 mg | ORAL_TABLET | Freq: Every day | ORAL | 3 refills | Status: DC

## 2020-03-15 MED ORDER — AMLODIPINE BESYLATE 5 MG PO TABS: 5.0000 mg | ORAL_TABLET | Freq: Every day | ORAL | Status: DC

## 2020-03-15 MED ORDER — LORATADINE 10 MG PO TABS: 10.0000 mg | ORAL_TABLET | Freq: Every day | ORAL | Status: DC | PRN

## 2020-03-15 MED ORDER — LOSARTAN POTASSIUM 50 MG PO TABS: 50.0000 mg | ORAL_TABLET | Freq: Every day | ORAL | Status: AC

## 2020-03-15 MED ORDER — METOPROLOL TARTRATE 25 MG PO TABS: 12.5000 mg | ORAL_TABLET | Freq: Two times a day (BID) | ORAL | Status: AC

## 2020-03-15 MED ORDER — BASAGLAR KWIKPEN 100 UNIT/ML ~~LOC~~ SOPN: 5.0000 [IU] | PEN_INJECTOR | Freq: Every day | SUBCUTANEOUS | Status: AC

## 2020-03-15 MED ORDER — CYANOCOBALAMIN 1000 MCG PO TABS: 1000.0000 ug | ORAL_TABLET | Freq: Every day | ORAL | Status: AC

## 2020-03-15 NOTE — TOC Transition Note (Signed)
Transition of Care [facility]) - CM/SW Discharge Note   Patient Details  Name: [name] MRN: [id number] Date of Birth: 01-27-1955  [facility]) CM/SW Contact:  Lia Hopping, [place] Phone Number: 03/15/2020, 3:49 PM   Clinical Narrative:    Spouse has requested to take the patient home with home health services. Spouse reports she has reached out to the patient's physician about pain medication changes. She hopes he can be placed in a rehab facility from home [facility] [phone] A). CSW offered Home Health choices. Spouse chose [facility]. CSW notified the physician. Orders put in for PT/OT/Nurse aide/SW. CSW made a referral to the hospital liaison Cindie and notified her of the plan to place the patient from home. Spouse confirm the patient has DME/oxygen. Spouse has requested [facility] transport  home.    Final next level of care: Home w Home Health Services Barriers to Discharge: Barriers Resolved   Patient Goals and CMS Choice Patient states their goals for this hospitalization and ongoing recovery are:: to go rehab      Discharge Placement                       Discharge Plan and Services In-house Referral: Clinical Social Work Discharge Planning Services: CM Consult Post Acute Care Choice: Skilled Nursing Facility                    HH Arranged: PT,OT,Nurse's [place] Work [facility] Agency: [place] Date [place]: 03/15/20 Time [place]: 1519 Representative spoke with at [place]: Cindie  Social Determinants of Health ([place]) Interventions     Readmission Risk Interventions No flowsheet data found.

## 2020-03-15 NOTE — Discharge Summary (Addendum)
Discharge Summary  [name] MKL:[id number] DOB: Jan 03, 1956  PCP: Leanna Battles, MD  Admit date: 03/10/2020 Discharge date: 03/15/2020  Time spent: 40 mins   Recommendations for Outpatient Follow-up:  1. Follow-up with PCP in 1 week 2. Follow-up with orthopedics in 1 week    Discharge Diagnoses:  Active Hospital Problems   Diagnosis Date Noted  . Rib fractures 03/10/2020  . Closed fracture of proximal end of left humerus 03/10/2020  . Normocytic anemia 03/10/2020  . Thrombocytopenia ([place]) 03/10/2020  . COPD, severe ([place]) 12/17/2019  . Depression with anxiety 05/31/2018  . Acute renal failure superimposed on stage 3b chronic kidney disease ([place]) 05/31/2018  . Chronic respiratory failure with hypoxia ([place]) 05/31/2018  . Chronic pain 05/31/2018  . Diabetes mellitus type 1 with manifestations ([place]) 04/14/2018  . Essential hypertension 08/01/2013    Resolved Hospital Problems  No resolved problems to display.    Discharge Condition: Stable  Diet recommendation: Heart healthy/moderate carb  Vitals:   03/15/20 0614 03/15/20 1350  BP: (!) 155/60 135/81  Pulse: (!) 57 (!) 56  Resp: 16   Temp: 99.3 F (37.4 C) 98 F (36.7 C)  SpO2: 92% 99%    History of present illness:  [name] a 65 y.o.malewith medical history significant forCOPD, OSA with CPAP intolerance, chronic hypoxic respiratory failure (on 3.5L of O2), CKD 3B, DM type 2, HTN, depression, anxiety, and chronic pain, who presents to the ED with general weakness and debility, and was unable to get up after sliding to the floor. Patient reports that he was seen in the ED on 03/09/20, after a fall onto his left side, was found to have fracture involving the proximal left humerus, was discharged with orthopedic follow-up. Patient reports that he did hit his head when he fell on 03/09/2020 but denies any loss of consciousness, headache, or focal numbness or weakness. He fell onto his left side and and has  been experiencing pain in his upper left arm, and to lesser extent, the left hip and left flank. Patient's wife was concerned that he has become progressively weak in general over the past 2 years and she is unable to care for him at home any longer due to his profound debility. He does not normally use a cane or walker. He reports smoking 7 to 8 cigarettes/day and is actively working on cutting back with a goal of quitting again. Oxygen dependent. In the ED, saturating mid to upper 90s on his usual supplemental oxygen, and with stable blood pressure. Chemistry panel is notable for BUN of 58 and creatinine 2.77, up from 1.6 in November. Head CT negative for acute intracranial normality.X-ray ribs demonstrate acute minimally displaced left 7 and 8 rib fractures. Pt admitted for further management.     Today, patient denies any new complaints, still with some mild discomfort in his left upper extremity, but denies it worsening.  Patient and spouse with difficulty getting a SNF due to patient's being on methadone.  Plan is to DC patient with home health PT/OT/aide/social work-to follow-up for further needs of SNF in the events patient's pain medicine clinic will temporarily change his medications.     Hospital Course:  Principal Problem:   Rib fractures Active Problems:   Essential hypertension   Diabetes mellitus type 1 with manifestations (HCC)   Chronic respiratory failure with hypoxia (HCC)   Acute renal failure superimposed on stage 3b chronic kidney disease (HCC)   Depression with anxiety   Chronic pain  COPD, severe ([place])   Closed fracture of proximal end of left humerus   Normocytic anemia   Thrombocytopenia (HCC)    Debility; falls; proximal left humerus fracture; rib fractures Continue left arm sling, pulmonary toilet, pain-control For left humerus fracture, [facility] consulted, recommend no surgical intervention and to follow-up as an outpatient with Dr. Stann Mainland in 1  week PT/OT-recommend SNF, spouse now wants to take patient home  AKI on CKD IIIb Improved SCr is 2.77 on admission, up from apparent baseline of ~1.6 Renal ultrasound normal S/P IVF, renally-dose medications  COPD with chronic hypoxic respiratory failure On 3.5L of O2 chronically Stable, continue supplemental O2, ICS/LAMA/LABA, and prn SABA  Hypertension Stable-made medication adjustments as BP was low running on the lower end Restart losartan at lower dose, 50 mg daily, Lasix every other day, please adjust meds pending BP readings and serum creatinine Continue Norvasc at 5 mg daily , metoprolol 12.5 mg BID (both adjusted)  Type 2 DM, controlled A1c 6.6 Noted hypoglycemic episode on 03/14/20, lantus held SSI, Accu-Cheks, hypoglycemic protocol, lantus reduced to 5U daily  Anemia of chronic kidney disease/iron deficiency anemia Thrombocytopenia Anemia panel showed iron 17, saturation 10, vitamin B12 350 Received 1 dose of Feraheme on 03/12/2020 Continue oral iron and vitamin B12 supplements Will need repeat outpatient screening colonoscopy, last done was over 10 years as per wife  Depression, anxiety Continue home regimen   Chronic pain syndrome Continue methadone Continue PO oxycodone for now due to acute pain due to fractures      Estimated body mass index is 21.91 kg/m as calculated from the following:   Height as of this encounter: 5\' 9"  (1.753 m).   Weight as of this encounter: 67.3 kg.    Procedures:  None  Consultations:  Orthopedics  Discharge Exam: BP 135/81 (BP Location: Right Arm)   Pulse (!) 56   Temp 98 F (36.7 C) (Oral)   Resp 16   Ht 5\' 9"  (1.753 m)   Wt 67.3 kg   SpO2 99%   BMI 21.91 kg/m   General: NAD Cardiovascular: S1, S2 present Respiratory: Diminished breath sounds bilaterally    Discharge Instructions You were cared for by a hospitalist during your hospital stay. If you have any questions about your  discharge medications or the care you received while you were in the hospital after you are discharged, you can call the unit and asked to speak with the hospitalist on call if the hospitalist that took care of you is not available. Once you are discharged, your primary care physician will handle any further medical issues. Please note that NO REFILLS for any discharge medications will be authorized once you are discharged, as it is imperative that you return to your primary care physician (or establish a relationship with a primary care physician if you do not have one) for your aftercare needs so that they can reassess your need for medications and monitor your lab values.  Discharge Instructions    Diet - low sodium heart healthy   Complete by: As directed    Increase activity slowly   Complete by: As directed      Allergies as of 03/15/2020      Reactions   Codeine Nausea And Vomiting   Tetracyclines & Related Hives   Venlafaxine    Other reaction(s): sexual sxs      Medication List    STOP taking these medications   cephALEXin 500 MG capsule Commonly known as: KEFLEX  HYDROcodone-acetaminophen 7.5-325 MG tablet Commonly known as: NORCO     TAKE these medications   acetaminophen 650 MG CR tablet Commonly known as: TYLENOL Take 650 mg by mouth daily.   albuterol (2.5 MG/3ML) 0.083% nebulizer solution Commonly known as: PROVENTIL Take 3 mLs (2.5 mg total) by nebulization every 6 (six) hours as needed for wheezing or shortness of breath.   amLODipine 5 MG tablet Commonly known as: NORVASC Take 1 tablet (5 mg total) by mouth daily. What changed: when to take this   aspirin EC 81 MG tablet Take 81 mg by mouth daily.   Basaglar KwikPen 100 UNIT/ML Inject 5 Units into the skin daily. What changed:   how much to take  Another medication with the same name was removed. Continue taking this medication, and follow the directions you see here.   Breztri Aerosphere  160-9-4.8 MCG/ACT Aero Generic drug: Budeson-Glycopyrrol-Formoterol Inhale 2 puffs into the lungs 2 (two) times daily as needed (wheezing).   cyanocobalamin 1000 MCG tablet Take 1 tablet (1,000 mcg total) by mouth daily. Start taking on: March 16, 2020   divalproex 250 MG DR tablet Commonly known as: DEPAKOTE Take 250 mg by mouth daily.   doxepin 25 MG capsule Commonly known as: SINEQUAN Take 25 mg by mouth at bedtime.   ferrous sulfate 325 (65 FE) MG tablet Take 1 tablet (325 mg total) by mouth daily with breakfast. Start taking on: March 16, 2020   furosemide 40 MG tablet Commonly known as: LASIX Take 40 mg by mouth every other day. What changed: Another medication with the same name was removed. Continue taking this medication, and follow the directions you see here.   gabapentin 300 MG capsule Commonly known as: NEURONTIN Take 300 mg by mouth 2 (two) times daily.   losartan 50 MG tablet Commonly known as: COZAAR Take 1 tablet (50 mg total) by mouth daily. What changed:   medication strength  how much to take   methadone 10 MG tablet Commonly known as: DOLOPHINE Take 10-20 mg by mouth See admin instructions. Takes 2 tablets in the morning  and 1 tablet in the afternoon.   metoprolol tartrate 25 MG tablet Commonly known as: LOPRESSOR Take 0.5 tablets (12.5 mg total) by mouth 2 (two) times daily. Take 2 tablets (50 mg) by mouth every morning and 1 tablet (25 mg) at bedtime What changed:   how much to take  when to take this   oxyCODONE 5 MG immediate release tablet Commonly known as: Oxy IR/ROXICODONE Take 1 tablet (5 mg total) by mouth every 6 (six) hours as needed for up to 3 days for moderate pain.   OXYGEN Inhale 3.5 L into the lungs continuous.   sertraline 50 MG tablet Commonly known as: ZOLOFT Take 50 mg by mouth daily.   simvastatin 20 MG tablet Commonly known as: ZOCOR Take 20 mg by mouth daily.   Trelegy Ellipta 100-62.5-25 MCG/INH  Aepb Generic drug: Fluticasone-Umeclidin-Vilant Inhale 1 puff into the lungs daily. What changed: Another medication with the same name was removed. Continue taking this medication, and follow the directions you see here.      Allergies  Allergen Reactions  . Codeine Nausea And Vomiting  . Tetracyclines & Related Hives  . Venlafaxine     Other reaction(s): sexual sxs    Follow-up Information    Nicholes Stairs, MD. Schedule an appointment as soon as possible for a visit in 1 week(s).   Specialty: Orthopedic Surgery Contact information: [phone] [place] [address] [phone]        Leanna Battles, MD. Schedule an appointment as soon as possible for a visit in 1 week(s).   Specialty: Internal Medicine Contact information: [place] [address] [phone]                The results of significant diagnostics from this hospitalization (including imaging, microbiology, ancillary and laboratory) are listed below for reference.    Significant Diagnostic Studies: DG Ribs Unilateral W/Chest Left  Result Date: 03/10/2020 CLINICAL DATA:  Fall with back pain EXAM: LEFT RIBS AND CHEST - 3+ VIEW COMPARISON:  12/16/2019 FINDINGS: Single-view chest demonstrates no focal opacity or pleural effusion. Normal cardiomediastinal silhouette. No pneumothorax. Left rib series demonstrates acute minimally displaced left seventh and eighth posterolateral rib fractures. There is an acute comminuted proximal left humerus fracture. IMPRESSION: Acute comminuted proximal left humerus fracture. Acute minimally displaced left seventh and eighth posterolateral rib fractures. No pneumothorax. Electronically Signed   By: Donavan Foil M.D.   On: 03/10/2020 22:44   DG Lumbar Spine Complete  Result Date: 03/10/2020 CLINICAL DATA:  Fall with back pain EXAM: LUMBAR SPINE - COMPLETE 4+ VIEW COMPARISON:  08/23/2018 FINDINGS: Mild dextroscoliosis of the spine.  Lateral view is limited by oblique positioning. Vertebral body heights are grossly maintained. Moderate multilevel degenerative change with disc space narrowing and osteophyte. Aortic atherosclerosis. IMPRESSION: Limited by positioning. Moderate multilevel degenerative change. No definite acute osseous abnormality. Electronically Signed   By: Donavan Foil M.D.   On: 03/10/2020 22:40   DG Elbow Complete Left  Result Date: 03/09/2020 CLINICAL DATA:  Fall with shoulder pain EXAM: LEFT ELBOW - COMPLETE 3+ VIEW COMPARISON:  None. FINDINGS: There is no evidence of fracture, dislocation, or joint effusion. There is no evidence of arthropathy or other focal bone abnormality. Soft tissues are unremarkable. IMPRESSION: Negative. Electronically Signed   By: Donavan Foil M.D.   On: 03/09/2020 21:56   DG Wrist Complete Left  Result Date: 03/09/2020 CLINICAL DATA:  Fall with wrist pain EXAM: LEFT WRIST - COMPLETE 3+ VIEW COMPARISON:  None. FINDINGS: No fracture or malalignment. Mild degenerative changes at the distal radioulnar joint, first MCP joint and STT interval. Soft tissues are unremarkable IMPRESSION: No acute osseous abnormality. Electronically Signed   By: Donavan Foil M.D.   On: 03/09/2020 21:57   CT Head Wo Contrast  Result Date: 03/10/2020 CLINICAL DATA:  Head trauma EXAM: CT HEAD WITHOUT CONTRAST TECHNIQUE: Contiguous axial images were obtained from the base of the skull through the vertex without intravenous contrast. 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 No gross acute territorial infarction, hemorrhage or intracranial mass is visualized. Mild periventricular Smick matter hypodensity likely chronic small vessel ischemic change. Mild atrophy. Stable ventricle size. Vascular: No hyperdense vessels.  Carotid vascular calcification Skull: No fracture Sinuses/Orbits: No acute finding. Other: Small left mastoid effusion IMPRESSION: 1.  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 No gross CT evidence for acute intracranial abnormality. 2. Atrophy and mild chronic small vessel ischemic changes of the [place] matter. Electronically Signed   By: Donavan Foil M.D.   On: 03/10/2020 22:48   CT SHOULDER LEFT WO CONTRAST  Result Date: 03/12/2020 CLINICAL DATA:  Left proximal humerus fracture after fall. EXAM: CT OF THE UPPER LEFT EXTREMITY WITHOUT CONTRAST TECHNIQUE: Multidetector CT imaging of the left shoulder was performed according to the standard protocol. COMPARISON:  Left shoulder x-rays dated March 09, 2020. MRI left shoulder dated September 04, 2008. FINDINGS: Bones/Joint/Cartilage Acute comminuted impacted fracture of the proximal humerus surgical neck extending into the posterior greater tuberosity. There is up to 6 mm anterior displacement and 1.5 cm of impaction. No additional fracture. No dislocation. Mild acromioclavicular and glenohumeral joint space narrowing with small marginal osteophytes. Type 3 acromion with prominent subacromial spurring. Small glenohumeral joint effusion. Ligaments Ligaments are suboptimally evaluated by CT. Muscles and Tendons Grossly intact. Scattered foci of hydroxyapatite deposition within the supraspinatus and infraspinatus tendons. No muscle atrophy. Soft tissue No fluid collection or hematoma. No soft tissue mass. Mild centrilobular emphysema in the visualized left lung. IMPRESSION: 1. Acute comminuted mildly displaced and impacted fracture of the proximal humerus surgical neck extending into the posterior greater tuberosity. 2. Mild glenohumeral and acromioclavicular osteoarthritis. 3. Emphysema (ICD10-J43.9). Electronically Signed   By: Titus Dubin M.D.   On: 03/12/2020 08:19   US RENAL  Result Date: 03/11/2020 CLINICAL DATA:  Acute renal failure. EXAM: RENAL / URINARY TRACT ULTRASOUND COMPLETE COMPARISON:  March 31, 2019 FINDINGS: Right Kidney: Renal measurements: 9.8 cm x 4.3 cm  x 4.6 cm = volume: 99.9 mL. Echogenicity within normal limits. No mass or hydronephrosis visualized. Left Kidney: Renal measurements: 10.6 cm x 5.6 cm x 5.0 cm = volume: 155.2 mL. Echogenicity within normal limits. No mass or hydronephrosis visualized. Bladder: Appears normal for degree of bladder distention. Other: None. IMPRESSION: Normal renal ultrasound. Electronically Signed   By: Virgina Norfolk M.D.   On: 03/11/2020 00:47   DG Shoulder Left  Result Date: 03/09/2020 CLINICAL DATA:  Fall with shoulder pain EXAM: LEFT SHOULDER - 2+ VIEW COMPARISON:  08/23/2018 FINDINGS: AC joint is intact. There is calcific tendinosis at the humeral head. Acute comminuted and mildly displaced fracture involving left humeral neck and greater tuberosity. No humeral head dislocation. Slight inferior subluxation of the humeral head potentially due to effusion IMPRESSION: Acute comminuted and mildly displaced fracture involving the left humeral neck and greater tuberosity. Mild inferior subluxation of humeral head with respect to glenoid fossa but no displacement in anterior to posterior direction on Y-view. Question shoulder effusion. There is calcific tendinosis. Electronically Signed   By: Donavan Foil M.D.   On: 03/09/2020 21:55   DG Humerus Left  Result Date: 03/09/2020 CLINICAL DATA:  Fall with arm pain EXAM: LEFT HUMERUS - 2+ VIEW COMPARISON:  None. FINDINGS: Incompletely visualized comminuted fracture involving proximal humerus. The mid to distal portion of the humerus shows no fracture or malalignment IMPRESSION: Incompletely visualized comminuted proximal humerus fracture. Electronically Signed   By: Donavan Foil M.D.   On: 03/09/2020 21:53   CT CHEST LUNG CA SCREEN LOW DOSE W/O CM  Result Date: 03/02/2020 CLINICAL DATA:  Fifty pack-year smoking history.  Current smoker. EXAM: CT CHEST WITHOUT CONTRAST LOW-DOSE FOR LUNG CANCER SCREENING TECHNIQUE: Multidetector CT imaging of the chest was performed following  the standard protocol without IV contrast. COMPARISON:  12/16/2019 chest radiograph. No prior CT. FINDINGS: Cardiovascular: Aortic atherosclerosis. Normal heart size, without pericardial effusion. Multivessel coronary artery atherosclerosis. Mediastinum/Nodes: Borderline precarinal node of 1.0 cm on 24/2. Multiple prevascular nodes including at up to 8 mm on 23/2. Hilar regions poorly evaluated without intravenous contrast. Lungs/Pleura: No pleural fluid. Moderate centrilobular emphysema. Left lower lobe scarring. Bilateral pulmonary nodules of maximally volume derived equivalent diameter 2.8 mm. A left upper lobe nodule, superimposed upon branching vessels, measures volume derived equivalent diameter 8.8 mm, including on 106/9. Upper  Abdomen: Normal imaged portions of the liver, spleen, stomach, kidneys. Left greater than right adrenal nodularity. The left adrenal nodule measures 1.4 cm and has indeterminate density measurements on 58/2. Musculoskeletal: Mild bilateral gynecomastia. Advanced thoracic spondylosis. IMPRESSION: 1. Lung-RADS 4A, suspicious. Follow up low-dose chest CT without contrast in 3 months (please use the following order, "CT CHEST LCS NODULE FOLLOW-UP W/O CM") is recommended. Alternatively, PET may be considered when there is a solid component 78mm or larger. Left upper lobe pulmonary nodule of volume derived equivalent diameter 8.8 mm. 2. Borderline to mild middle and anterior mediastinal adenopathy, indeterminate. Recommend attention on follow-up. 3. Aortic atherosclerosis (ICD10-I70.0), coronary artery atherosclerosis and emphysema (ICD10-J43.9). 4. Indeterminate left adrenal nodule. This could also be re-evaluated on follow-up screening CT or PET. 5. Mild bilateral gynecomastia. These results will be called to the ordering clinician or representative by the Radiologist Assistant, and communication documented in the PACS or [employer]. Electronically Signed   By: Abigail Miyamoto M.D.   On:  03/02/2020 09:21   DG Hip Unilat W or Wo Pelvis 2-3 Views Left  Result Date: 03/10/2020 CLINICAL DATA:  Fall EXAM: DG HIP (WITH OR WITHOUT PELVIS) 2-3V LEFT COMPARISON:  08/23/2018 FINDINGS: SI joints are non widened. Pubic symphysis and rami appear intact. No fracture or malalignment. Mild degenerative changes of the hips. IMPRESSION: No acute osseous abnormality. Electronically Signed   By: Donavan Foil M.D.   On: 03/10/2020 22:41    Microbiology: Recent Results (from the past 240 hour(s))  SARS CORONAVIRUS 2 (TAT 6-24 HRS) Nasopharyngeal Nasopharyngeal Swab     Status: None   Collection Time: 03/10/20 11:40 PM   Specimen: Nasopharyngeal Swab  Result Value Ref Range Status   SARS Coronavirus 2 NEGATIVE NEGATIVE Final    Comment: (NOTE) SARS-CoV-2 target nucleic acids are NOT DETECTED.  The SARS-CoV-2 RNA is generally detectable in upper and lower respiratory specimens during the acute phase of infection. Negative results do not preclude SARS-CoV-2 infection, do not rule out co-infections with other pathogens, and should not be used as the sole basis for treatment or other patient management decisions. Negative results must be combined with clinical observations, patient history, and epidemiological information. The expected result is Negative.  Fact Sheet for Patients: SugarRoll.be  Fact Sheet for Healthcare Providers: https://www.woods-mathews.com/  This test is not yet approved or cleared by the [place] FDA and  has been authorized for detection and/or diagnosis of SARS-CoV-2 by FDA under an Emergency Use Authorization (EUA). This EUA will remain  in effect (meaning this test can be used) for the duration of the COVID-19 declaration under Se ction 564(b)(1) of the Act, 21 U.S.C. section 360bbb-3(b)(1), unless the authorization is terminated or revoked sooner.  Performed at [facility], [place] [address], [place],  [place] [address]   Resp Panel by RT-PCR (Flu A&B, Covid) Nasopharyngeal Swab     Status: None   Collection Time: 03/15/20 11:33 AM   Specimen: Nasopharyngeal Swab; Nasopharyngeal(NP) swabs in vial transport medium  Result Value Ref Range Status   SARS Coronavirus 2 by RT PCR NEGATIVE NEGATIVE Final    Comment: (NOTE) SARS-CoV-2 target nucleic acids are NOT DETECTED.  The SARS-CoV-2 RNA is generally detectable in upper respiratory specimens during the acute phase of infection. The lowest concentration of SARS-CoV-2 viral copies this assay can detect is 138 copies/mL. A negative result does not preclude SARS-Cov-2 infection and should not be used as the sole basis for treatment or other patient management decisions.  A negative result may occur with  improper specimen collection/handling, submission of specimen other than nasopharyngeal swab, presence of viral mutation(s) within the areas targeted by this assay, and inadequate number of viral copies(<138 copies/mL). A negative result must be combined with clinical observations, patient history, and epidemiological information. The expected result is Negative.  Fact Sheet for Patients:  EntrepreneurPulse.com.au  Fact Sheet for Healthcare Providers:  IncredibleEmployment.be  This test is no t yet approved or cleared by the [place] FDA and  has been authorized for detection and/or diagnosis of SARS-CoV-2 by FDA under an Emergency Use Authorization (EUA). This EUA will remain  in effect (meaning this test can be used) for the duration of the COVID-19 declaration under Section 564(b)(1) of the Act, 21 U.S.C.section 360bbb-3(b)(1), unless the authorization is terminated  or revoked sooner.       Influenza A by PCR NEGATIVE NEGATIVE Final   Influenza B by PCR NEGATIVE NEGATIVE Final    Comment: (NOTE) The Xpert Xpress SARS-CoV-2/FLU/RSV plus assay is intended as an aid in the diagnosis of influenza  from Nasopharyngeal swab specimens and should not be used as a sole basis for treatment. Nasal washings and aspirates are unacceptable for Xpert Xpress SARS-CoV-2/FLU/RSV testing.  Fact Sheet for Patients: EntrepreneurPulse.com.au  Fact Sheet for Healthcare Providers: IncredibleEmployment.be  This test is not yet approved or cleared by the [place] FDA and has been authorized for detection and/or diagnosis of SARS-CoV-2 by FDA under an Emergency Use Authorization (EUA). This EUA will remain in effect (meaning this test can be used) for the duration of the COVID-19 declaration under Section 564(b)(1) of the Act, 21 U.S.C. section 360bbb-3(b)(1), unless the authorization is terminated or revoked.  Performed at [facility], [place] [address] [place], [place] [address]      Labs: Basic Metabolic Panel: Recent Labs  Lab 03/10/20 2136 03/11/20 1330 03/12/20 0331 03/13/20 0259  NA 135 141 140 141  K 3.7 3.6 4.1 3.7  CL 96* 105 106 104  CO2 27 27 27 28   GLUCOSE 174* 135* 87 138*  BUN 58* 48* 46* 35*  CREATININE 2.77* 2.01* 1.97* 1.50*  CALCIUM 8.4* 7.6* 8.2* 8.6*   Liver Function Tests: No results for input(s): AST, ALT, ALKPHOS, BILITOT, PROT, ALBUMIN in the last 168 hours. No results for input(s): LIPASE, AMYLASE in the last 168 hours. No results for input(s): AMMONIA in the last 168 hours. CBC: Recent Labs  Lab 03/10/20 2136 03/11/20 1330 03/12/20 0331 03/13/20 0259  WBC 7.8 4.2 3.6* 3.8*  NEUTROABS 6.0  --   --   --   HGB 9.5* 7.8* 7.3* 8.2*  HCT 29.3* 24.7* 23.3* 25.1*  MCV 88.0 89.5 90.3 88.1  PLT 121* 109* 120* 148*   Cardiac Enzymes: No results for input(s): CKTOTAL, CKMB, CKMBINDEX, TROPONINI in the last 168 hours. BNP: BNP (last 3 results) No results for input(s): BNP in the last 8760 hours.  ProBNP (last 3 results) Recent Labs    12/16/19 1515  PROBNP 147.0*    CBG: Recent Labs  Lab  03/14/20 1153 03/14/20 1558 03/14/20 2211 03/15/20 0731 03/15/20 1123  GLUCAP 102* 122* 116* 118* 86       Signed:  Alma Friendly, MD [facility] 03/15/2020, 2:40 PM

## 2020-03-15 NOTE — Progress Notes (Signed)
Occupational Therapy Treatment Patient Details Name: [name] MRN: [id number] DOB: 02-28-1955 Today's Date: 03/15/2020    History of present illness [name] is a 65 y.o. male with medical history significant for COPD, OSA with CPAP intolerance, chronic hypoxic respiratory failure, chronic kidney disease 3B, insulin-dependent diabetes mellitus, hypertension, depression, anxiety, and chronic pain, who presents to the emergency department with general weakness and debility, and was unable to get up after sliding to the floor.  Patient reports that he was seen in the emergency department yesterday after a fall onto his left side, was found to have fracture involving the proximal left humerus, was discharged with orthopedic follow-up, but then slid to the ground tonight, was unable to get up with assistance from his wife, Patient found to have left sided rib fractures.   OT comments  Patient not showing observable progress this date, but willing to work with OT after encouragement and cooperative with instructions.  Pt is mobilizing more, performing 4 reps of Sit to/froms stands from EOB, working on LE dressing, and attending to instructions for sling positioning and precautions, compared to previous session. Patient limited by decreased awareness of safety and impairments, reluctance to go to rehab although pt admits that his wife is not currently able to assist him sufficiently given currnt injuries, along with deficits noted below. Pt continues to demonstrate good rehab potential to progress towards OT goals and would benefit from continued skilled OT to increase safety and independence with ADLs and functional transfers to allow pt to return home safely and reduce caregiver burden and fall risk.   Follow Up Recommendations  SNF    Equipment Recommendations  3 in 1 bedside commode;Tub/shower seat    Recommendations for Other Services      Precautions / Restrictions  Precautions Precautions: Shoulder;Fall Type of Shoulder Precautions: No AROM, NO PROM Shoulder Interventions: Shoulder sling/immobilizer;At all times Restrictions Weight Bearing Restrictions: Yes LUE Weight Bearing: Non weight bearing Other Position/Activity Restrictions: adjusted, corrected sling position once pt in sitting       Mobility Bed Mobility Overal bed mobility: Needs Assistance Bed Mobility: Supine to Sit;Sit to Supine     Supine to sit: Mod assist;HOB elevated Sit to supine: Min assist   General bed mobility comments: Pt scooted up to [facility] in supine with cues to bend knees and push with LEs, pt initiated pull with RUE and able to scoot with Min guard. Patient Response: Impulsive  Transfers Overall transfer level: Needs assistance Equipment used: Hemi-walker Transfers: Sit to/from Stand Sit to Stand: Min assist;Mod assist Stand pivot transfers: Mod assist;+2 safety/equipment       General transfer comment: Pt stood from EOB x 4 reps for ADLs and progressed from Moderate HHA with pt pulling up on therapist's hand Mod As, to cues to push from bed rail and t/f hand to hemiwalker with pt progress to Min As. Very unsteady in standing with LOB x1 and lower to bed. Decreased safety awareness as pt stated that he was going to fall, and when instructed to sit down, pt reported, "No, I don't want to."    Balance Overall balance assessment: Needs assistance Sitting-balance support: Feet supported;Single extremity supported Sitting balance-Leahy Scale: Poor Sitting balance - Comments: Wavers latearlly, needs Min As to straighten at times.   Standing balance support: During functional activity;Single extremity supported Standing balance-Leahy Scale: Poor Standing balance comment: reliant  on UE support and external support, LOB  ADL either performed or assessed with clinical judgement   ADL   Eating/Feeding: Set up;Bed  level Eating/Feeding Details (indicate cue type and reason): Pt refused chair Grooming: Minimal assistance;Wash/dry hands;Standing Grooming Details (indicate cue type and reason): Increased pain when stabilizing washcloth in LT hand. Required Min As for pain mngt. Min As for standing balance. LOB without RUE support, with pt lowered to bed.             Lower Body Dressing: Moderate assistance;Sitting/lateral leans Lower Body Dressing Details (indicate cue type and reason): Pt donned shoes with increased time/effort while sitting EOB. Pt educated on long shoe horn for task simplification.   Toilet Transfer Details (indicate cue type and reason): Pt declined, using external male catheter.         Functional mobility during ADLs: Minimal assistance;Moderate assistance General ADL Comments: Hemi walker     Vision Baseline Vision/History: Wears glasses Wears Glasses: At all times Patient Visual Report: No change from baseline     Perception     Praxis      Cognition Arousal/Alertness: Awake/alert Behavior During Therapy: WFL for tasks assessed/performed Overall Cognitive Status: No family/caregiver present to determine baseline cognitive functioning Area of Impairment: Safety/judgement;Awareness                         Safety/Judgement: Decreased awareness of deficits   Problem Solving: Slow processing;Decreased initiation;Requires verbal cues General Comments: requires an excessive amount of time to complete basic tasks; needs increased time to process information poss due to baseline [facility] with cochlear implant and hearing aid.  Pt reports that he has a 67 year old (human) boy at home and wants to go feed him. (??)        Exercises     Shoulder Instructions       General Comments      Pertinent Vitals/ Pain       Pain Assessment: 0-10 Pain Score: 8  Pain Location: L arm/hand, LT back area Pain Descriptors / Indicators: Grimacing;Discomfort Pain  Intervention(s): Limited activity within patient's tolerance;Repositioned;Monitored during session (adjusted sling)  Home Living                                          Prior Functioning/Environment              Frequency  Min 2X/week        Progress Toward Goals  OT Goals(current goals can now be found in the care plan section)  Progress towards OT goals: Not progressing toward goals - comment  Acute Rehab OT Goals Patient Stated Goal: go home OT Goal Formulation: With patient Time For Goal Achievement: 03/25/20 Potential to Achieve Goals: Good  Plan      Co-evaluation                 AM-PAC OT "6 Clicks" Daily Activity     Outcome Measure   Help from another person eating meals?: A Little Help from another person taking care of personal grooming?: A Little Help from another person toileting, which includes using toliet, bedpan, or urinal?: A Lot Help from another person bathing (including washing, rinsing, drying)?: A Lot Help from another person to put on and taking off regular upper body clothing?: A Lot Help from another person to put on and taking off regular lower body clothing?: A Lot  6 Click Score: 14    End of Session Equipment Utilized During Treatment: Oxygen;Gait belt (Sling LUE)  OT Visit Diagnosis: Unsteadiness on feet (R26.81);Pain Pain - Right/Left: Left Pain - part of body: Shoulder   Activity Tolerance Patient limited by pain   Patient Left in bed;with call bell/phone within reach;with bed alarm set   Nurse Communication Mobility status        Time: 4982-6415 OT Time Calculation (min): 37 min  Charges: OT General Charges $OT Visit: 1 Visit OT Treatments $Self Care/Home Management : 8-22 mins $Therapeutic Activity: 8-22 mins  Anderson Malta, [place] Office: [phone] 03/15/2020    Julien Girt 03/15/2020, 2:38 PM

## 2020-03-15 NOTE — TOC Progression Note (Signed)
Transition of Care [name]) - Progression Note    Patient Details  Name: [name] MRN: [id number] Date of Birth: 03/25/1955  Transition of [facility]) CM/SW [place], LCSW Phone Number: 03/15/2020, 11:28 AM  Clinical Narrative:    CSW provided the bed offer to the patient spouse. She reports the facility too far for her to drive. CSW informed her the patient is medically stable to discharge today.  CSW explain the barriers to [place] facilities accepting the patient. Spouse reports understanding. She has requested time to call the patient pain clinic to determine if the physician will change the patient pain medication temporarily back to OxyContin. She plans to take him home with home with home health if this cannot be done.    Expected Discharge Plan: [place] Barriers to Discharge: Continued Medical Work up  Expected Discharge Plan and Services Expected Discharge Plan: [place] In-house Referral: Clinical Social Work Discharge Planning Services: CM Consult Post Acute Care Choice: [place] arrangements for the past 2 months: Single Family Home                                       Social Determinants of Health (SDOH) Interventions    Readmission Risk Interventions No flowsheet data found.

## 2020-03-15 NOTE — Progress Notes (Signed)
Physical Therapy Treatment Patient Details Name: [name] MRN: [id number] DOB: 1955/02/20 Today's Date: 03/15/2020    History of Present Illness [name] is a 65 y.o. male with medical history significant for COPD, OSA with CPAP intolerance, chronic hypoxic respiratory failure, chronic kidney disease 3B, insulin-dependent diabetes mellitus, hypertension, depression, anxiety, and chronic pain, who presents to the emergency department with general weakness and debility, and was unable to get up after sliding to the floor.  Patient reports that he was seen in the emergency department yesterday after a fall onto his left side, was found to have fracture involving the proximal left humerus, was discharged with orthopedic follow-up, but then slid to the ground tonight, was unable to get up with assistance from his wife, Patient found to have left sided rib fractures.    PT Comments    Pt able to get to EOB and transfer to chair with ~ mod assist and excessive time, cues to stay on task. Limited by pain.  Continue to recommend SNF post acute   Follow Up Recommendations  SNF     Equipment Recommendations  None recommended by PT    Recommendations for Other Services       Precautions / Restrictions Precautions Precautions: Shoulder;Fall Type of Shoulder Precautions: No AROM, NO PROM Shoulder Interventions: Shoulder sling/immobilizer;At all times Restrictions LUE Weight Bearing: Non weight bearing Other Position/Activity Restrictions: adjusted, corrected sling position once pt in sitting    Mobility  Bed Mobility Overal bed mobility: Needs Assistance Bed Mobility: Supine to Sit     Supine to sit: Mod assist;HOB elevated     General bed mobility comments: pt moved LEs to EOB with incr time, cues to stay on task and participate. pt prefers to pull on PT's hand to elevate trunk, needing mod assist even with HOB elevated ~80 degrees    Transfers Overall transfer level: Needs  assistance Equipment used: Hemi-walker Transfers: Sit to/from [facility] Sit to Stand: Min assist;Mod assist Stand pivot transfers: Mod assist;+2 safety/equipment       General transfer comment: cues for hand placement and use of hemiwalker in standing. pt able to take pivotal steps to recliner iwth min/mod assist for balance. assist to control descent '  Ambulation/Gait             General Gait Details: NT with +1 assist   Stairs             Wheelchair Mobility    Modified Rankin (Stroke Patients Only)       Balance   Sitting-balance support: Feet supported;Single extremity supported Sitting balance-Leahy Scale: Fair (cautioned pt against leaning on LUE in sitting)     Standing balance support: During functional activity;Single extremity supported Standing balance-Leahy Scale: Poor Standing balance comment: reliant  on UE support and external support                            Cognition Arousal/Alertness: Awake/alert Behavior During Therapy: WFL for tasks assessed/performed Overall Cognitive Status: No family/caregiver present to determine baseline cognitive functioning Area of Impairment: Problem solving                             Problem Solving: Slow processing;Decreased initiation;Requires verbal cues General Comments: requires an excessive amount of time to complete basic tasks      Exercises      General Comments  Pertinent Vitals/Pain Pain Assessment: 0-10 Pain Score: 10-Worst pain ever Pain Location: L arm/hand Pain Descriptors / Indicators: Grimacing;Discomfort Pain Intervention(s): Limited activity within patient's tolerance;Monitored during session;Patient requesting pain meds-RN notified;RN gave pain meds during session    Home Living                      Prior Function            PT Goals (current goals can now be found in the care plan section) Acute Rehab PT  Goals Patient Stated Goal: go home PT Goal Formulation: With patient Time For Goal Achievement: 03/26/20 Potential to Achieve Goals: Fair Progress towards PT goals: Progressing toward goals    Frequency    Min 2X/week      PT Plan Current plan remains appropriate    Co-evaluation              AM-PAC PT "6 Clicks" Mobility   Outcome Measure  Help needed turning from your back to your side while in a flat bed without using bedrails?: A Lot Help needed moving from lying on your back to sitting on the side of a flat bed without using bedrails?: A Lot Help needed moving to and from a bed to a chair (including a wheelchair)?: A Lot Help needed standing up from a chair using your arms (e.g., wheelchair or bedside chair)?: A Lot Help needed to walk in hospital room?: A Lot Help needed climbing 3-5 steps with a railing? : Total 6 Click Score: 11    End of Session Equipment Utilized During Treatment: Gait belt;Other (comment) (sling) Activity Tolerance: Patient limited by pain Patient left: in chair;with call bell/phone within reach;with bed alarm set;with nursing/sitter in room Nurse Communication: Mobility status PT Visit Diagnosis: Unsteadiness on feet (R26.81);History of falling (Z91.81);Difficulty in walking, not elsewhere classified (R26.2);Pain Pain - Right/Left: Left Pain - part of body: Shoulder;Hand     Time: 4742-5956 PT Time Calculation (min) (ACUTE ONLY): 24 min  Charges:  $Therapeutic Activity: 23-37 mins                     Baxter Flattery, PT  Acute Rehab Dept ([place]) [phone] Pager [phone]  03/15/2020    [facility] 03/15/2020, 11:20 AM

## 2020-03-15 NOTE — Care Management Important Message (Signed)
Medicare IM printed for Social work team at [facility] to give to the patient. °

## 2020-03-16 DIAGNOSIS — E1022 Type 1 diabetes mellitus with diabetic chronic kidney disease: Secondary | ICD-10-CM | POA: Diagnosis not present

## 2020-03-16 DIAGNOSIS — I129 Hypertensive chronic kidney disease with stage 1 through stage 4 chronic kidney disease, or unspecified chronic kidney disease: Secondary | ICD-10-CM | POA: Diagnosis not present

## 2020-03-16 DIAGNOSIS — S2242XD Multiple fractures of ribs, left side, subsequent encounter for fracture with routine healing: Secondary | ICD-10-CM | POA: Diagnosis not present

## 2020-03-16 DIAGNOSIS — D696 Thrombocytopenia, unspecified: Secondary | ICD-10-CM | POA: Diagnosis not present

## 2020-03-16 DIAGNOSIS — H903 Sensorineural hearing loss, bilateral: Secondary | ICD-10-CM | POA: Diagnosis not present

## 2020-03-16 DIAGNOSIS — Z792 Long term (current) use of antibiotics: Secondary | ICD-10-CM | POA: Diagnosis not present

## 2020-03-16 DIAGNOSIS — M4126 Other idiopathic scoliosis, lumbar region: Secondary | ICD-10-CM | POA: Diagnosis not present

## 2020-03-16 DIAGNOSIS — J449 Chronic obstructive pulmonary disease, unspecified: Secondary | ICD-10-CM | POA: Diagnosis not present

## 2020-03-16 DIAGNOSIS — N179 Acute kidney failure, unspecified: Secondary | ICD-10-CM | POA: Diagnosis not present

## 2020-03-16 DIAGNOSIS — G894 Chronic pain syndrome: Secondary | ICD-10-CM | POA: Diagnosis not present

## 2020-03-16 DIAGNOSIS — F17210 Nicotine dependence, cigarettes, uncomplicated: Secondary | ICD-10-CM | POA: Diagnosis not present

## 2020-03-16 DIAGNOSIS — E1042 Type 1 diabetes mellitus with diabetic polyneuropathy: Secondary | ICD-10-CM | POA: Diagnosis not present

## 2020-03-16 DIAGNOSIS — G4733 Obstructive sleep apnea (adult) (pediatric): Secondary | ICD-10-CM | POA: Diagnosis not present

## 2020-03-16 DIAGNOSIS — M19032 Primary osteoarthritis, left wrist: Secondary | ICD-10-CM | POA: Diagnosis not present

## 2020-03-16 DIAGNOSIS — S42202D Unspecified fracture of upper end of left humerus, subsequent encounter for fracture with routine healing: Secondary | ICD-10-CM | POA: Diagnosis not present

## 2020-03-16 DIAGNOSIS — J9611 Chronic respiratory failure with hypoxia: Secondary | ICD-10-CM | POA: Diagnosis not present

## 2020-03-16 DIAGNOSIS — E10319 Type 1 diabetes mellitus with unspecified diabetic retinopathy without macular edema: Secondary | ICD-10-CM | POA: Diagnosis not present

## 2020-03-16 DIAGNOSIS — D631 Anemia in chronic kidney disease: Secondary | ICD-10-CM | POA: Diagnosis not present

## 2020-03-16 DIAGNOSIS — M47816 Spondylosis without myelopathy or radiculopathy, lumbar region: Secondary | ICD-10-CM | POA: Diagnosis not present

## 2020-03-16 DIAGNOSIS — G4700 Insomnia, unspecified: Secondary | ICD-10-CM | POA: Diagnosis not present

## 2020-03-16 DIAGNOSIS — I700 Atherosclerosis of aorta: Secondary | ICD-10-CM | POA: Diagnosis not present

## 2020-03-16 DIAGNOSIS — N1832 Chronic kidney disease, stage 3b: Secondary | ICD-10-CM | POA: Diagnosis not present

## 2020-03-16 DIAGNOSIS — Z79891 Long term (current) use of opiate analgesic: Secondary | ICD-10-CM | POA: Diagnosis not present

## 2020-03-16 DIAGNOSIS — Z9981 Dependence on supplemental oxygen: Secondary | ICD-10-CM | POA: Diagnosis not present

## 2020-03-16 DIAGNOSIS — F418 Other specified anxiety disorders: Secondary | ICD-10-CM | POA: Diagnosis not present

## 2020-03-17 DIAGNOSIS — E1022 Type 1 diabetes mellitus with diabetic chronic kidney disease: Secondary | ICD-10-CM | POA: Diagnosis not present

## 2020-03-17 DIAGNOSIS — S2242XD Multiple fractures of ribs, left side, subsequent encounter for fracture with routine healing: Secondary | ICD-10-CM | POA: Diagnosis not present

## 2020-03-17 DIAGNOSIS — S42202D Unspecified fracture of upper end of left humerus, subsequent encounter for fracture with routine healing: Secondary | ICD-10-CM | POA: Diagnosis not present

## 2020-03-17 DIAGNOSIS — N1832 Chronic kidney disease, stage 3b: Secondary | ICD-10-CM | POA: Diagnosis not present

## 2020-03-17 DIAGNOSIS — J449 Chronic obstructive pulmonary disease, unspecified: Secondary | ICD-10-CM | POA: Diagnosis not present

## 2020-03-17 DIAGNOSIS — I129 Hypertensive chronic kidney disease with stage 1 through stage 4 chronic kidney disease, or unspecified chronic kidney disease: Secondary | ICD-10-CM | POA: Diagnosis not present

## 2020-03-19 DIAGNOSIS — E1140 Type 2 diabetes mellitus with diabetic neuropathy, unspecified: Secondary | ICD-10-CM | POA: Diagnosis not present

## 2020-03-19 DIAGNOSIS — J449 Chronic obstructive pulmonary disease, unspecified: Secondary | ICD-10-CM | POA: Diagnosis not present

## 2020-03-19 DIAGNOSIS — I129 Hypertensive chronic kidney disease with stage 1 through stage 4 chronic kidney disease, or unspecified chronic kidney disease: Secondary | ICD-10-CM | POA: Diagnosis not present

## 2020-03-19 DIAGNOSIS — S2242XD Multiple fractures of ribs, left side, subsequent encounter for fracture with routine healing: Secondary | ICD-10-CM | POA: Diagnosis not present

## 2020-03-19 DIAGNOSIS — M19272 Secondary osteoarthritis, left ankle and foot: Secondary | ICD-10-CM | POA: Diagnosis not present

## 2020-03-19 DIAGNOSIS — N1832 Chronic kidney disease, stage 3b: Secondary | ICD-10-CM | POA: Diagnosis not present

## 2020-03-19 DIAGNOSIS — G894 Chronic pain syndrome: Secondary | ICD-10-CM | POA: Diagnosis not present

## 2020-03-19 DIAGNOSIS — E1022 Type 1 diabetes mellitus with diabetic chronic kidney disease: Secondary | ICD-10-CM | POA: Diagnosis not present

## 2020-03-19 DIAGNOSIS — M25572 Pain in left ankle and joints of left foot: Secondary | ICD-10-CM | POA: Diagnosis not present

## 2020-03-19 DIAGNOSIS — S42202D Unspecified fracture of upper end of left humerus, subsequent encounter for fracture with routine healing: Secondary | ICD-10-CM | POA: Diagnosis not present

## 2020-03-22 DIAGNOSIS — S42295A Other nondisplaced fracture of upper end of left humerus, initial encounter for closed fracture: Secondary | ICD-10-CM | POA: Diagnosis not present

## 2020-03-22 DIAGNOSIS — M25532 Pain in left wrist: Secondary | ICD-10-CM | POA: Diagnosis not present

## 2020-03-23 DIAGNOSIS — N1832 Chronic kidney disease, stage 3b: Secondary | ICD-10-CM | POA: Diagnosis not present

## 2020-03-23 DIAGNOSIS — E1022 Type 1 diabetes mellitus with diabetic chronic kidney disease: Secondary | ICD-10-CM | POA: Diagnosis not present

## 2020-03-23 DIAGNOSIS — S42202D Unspecified fracture of upper end of left humerus, subsequent encounter for fracture with routine healing: Secondary | ICD-10-CM | POA: Diagnosis not present

## 2020-03-23 DIAGNOSIS — S2242XD Multiple fractures of ribs, left side, subsequent encounter for fracture with routine healing: Secondary | ICD-10-CM | POA: Diagnosis not present

## 2020-03-23 DIAGNOSIS — J449 Chronic obstructive pulmonary disease, unspecified: Secondary | ICD-10-CM | POA: Diagnosis not present

## 2020-03-23 DIAGNOSIS — I129 Hypertensive chronic kidney disease with stage 1 through stage 4 chronic kidney disease, or unspecified chronic kidney disease: Secondary | ICD-10-CM | POA: Diagnosis not present

## 2020-03-25 DIAGNOSIS — S42202D Unspecified fracture of upper end of left humerus, subsequent encounter for fracture with routine healing: Secondary | ICD-10-CM | POA: Diagnosis not present

## 2020-03-25 DIAGNOSIS — N1832 Chronic kidney disease, stage 3b: Secondary | ICD-10-CM | POA: Diagnosis not present

## 2020-03-25 DIAGNOSIS — J449 Chronic obstructive pulmonary disease, unspecified: Secondary | ICD-10-CM | POA: Diagnosis not present

## 2020-03-25 DIAGNOSIS — E1022 Type 1 diabetes mellitus with diabetic chronic kidney disease: Secondary | ICD-10-CM | POA: Diagnosis not present

## 2020-03-25 DIAGNOSIS — I129 Hypertensive chronic kidney disease with stage 1 through stage 4 chronic kidney disease, or unspecified chronic kidney disease: Secondary | ICD-10-CM | POA: Diagnosis not present

## 2020-03-25 DIAGNOSIS — S2242XD Multiple fractures of ribs, left side, subsequent encounter for fracture with routine healing: Secondary | ICD-10-CM | POA: Diagnosis not present

## 2020-03-26 DIAGNOSIS — S42202D Unspecified fracture of upper end of left humerus, subsequent encounter for fracture with routine healing: Secondary | ICD-10-CM | POA: Diagnosis not present

## 2020-03-26 DIAGNOSIS — J449 Chronic obstructive pulmonary disease, unspecified: Secondary | ICD-10-CM | POA: Diagnosis not present

## 2020-03-26 DIAGNOSIS — N1832 Chronic kidney disease, stage 3b: Secondary | ICD-10-CM | POA: Diagnosis not present

## 2020-03-26 DIAGNOSIS — S2242XD Multiple fractures of ribs, left side, subsequent encounter for fracture with routine healing: Secondary | ICD-10-CM | POA: Diagnosis not present

## 2020-03-26 DIAGNOSIS — I129 Hypertensive chronic kidney disease with stage 1 through stage 4 chronic kidney disease, or unspecified chronic kidney disease: Secondary | ICD-10-CM | POA: Diagnosis not present

## 2020-03-26 DIAGNOSIS — E1022 Type 1 diabetes mellitus with diabetic chronic kidney disease: Secondary | ICD-10-CM | POA: Diagnosis not present

## 2020-03-30 DIAGNOSIS — J449 Chronic obstructive pulmonary disease, unspecified: Secondary | ICD-10-CM | POA: Diagnosis not present

## 2020-03-30 DIAGNOSIS — S42202D Unspecified fracture of upper end of left humerus, subsequent encounter for fracture with routine healing: Secondary | ICD-10-CM | POA: Diagnosis not present

## 2020-03-30 DIAGNOSIS — E1022 Type 1 diabetes mellitus with diabetic chronic kidney disease: Secondary | ICD-10-CM | POA: Diagnosis not present

## 2020-03-30 DIAGNOSIS — I129 Hypertensive chronic kidney disease with stage 1 through stage 4 chronic kidney disease, or unspecified chronic kidney disease: Secondary | ICD-10-CM | POA: Diagnosis not present

## 2020-03-30 DIAGNOSIS — N1832 Chronic kidney disease, stage 3b: Secondary | ICD-10-CM | POA: Diagnosis not present

## 2020-03-30 DIAGNOSIS — S2242XD Multiple fractures of ribs, left side, subsequent encounter for fracture with routine healing: Secondary | ICD-10-CM | POA: Diagnosis not present

## 2020-03-31 DIAGNOSIS — E1022 Type 1 diabetes mellitus with diabetic chronic kidney disease: Secondary | ICD-10-CM | POA: Diagnosis not present

## 2020-03-31 DIAGNOSIS — S2242XD Multiple fractures of ribs, left side, subsequent encounter for fracture with routine healing: Secondary | ICD-10-CM | POA: Diagnosis not present

## 2020-03-31 DIAGNOSIS — I129 Hypertensive chronic kidney disease with stage 1 through stage 4 chronic kidney disease, or unspecified chronic kidney disease: Secondary | ICD-10-CM | POA: Diagnosis not present

## 2020-03-31 DIAGNOSIS — N1832 Chronic kidney disease, stage 3b: Secondary | ICD-10-CM | POA: Diagnosis not present

## 2020-03-31 DIAGNOSIS — J449 Chronic obstructive pulmonary disease, unspecified: Secondary | ICD-10-CM | POA: Diagnosis not present

## 2020-03-31 DIAGNOSIS — S42202D Unspecified fracture of upper end of left humerus, subsequent encounter for fracture with routine healing: Secondary | ICD-10-CM | POA: Diagnosis not present

## 2020-04-01 DIAGNOSIS — N1832 Chronic kidney disease, stage 3b: Secondary | ICD-10-CM | POA: Diagnosis not present

## 2020-04-01 DIAGNOSIS — S2242XD Multiple fractures of ribs, left side, subsequent encounter for fracture with routine healing: Secondary | ICD-10-CM | POA: Diagnosis not present

## 2020-04-01 DIAGNOSIS — S42202D Unspecified fracture of upper end of left humerus, subsequent encounter for fracture with routine healing: Secondary | ICD-10-CM | POA: Diagnosis not present

## 2020-04-01 DIAGNOSIS — J449 Chronic obstructive pulmonary disease, unspecified: Secondary | ICD-10-CM | POA: Diagnosis not present

## 2020-04-01 DIAGNOSIS — I129 Hypertensive chronic kidney disease with stage 1 through stage 4 chronic kidney disease, or unspecified chronic kidney disease: Secondary | ICD-10-CM | POA: Diagnosis not present

## 2020-04-01 DIAGNOSIS — E1022 Type 1 diabetes mellitus with diabetic chronic kidney disease: Secondary | ICD-10-CM | POA: Diagnosis not present

## 2020-04-05 DIAGNOSIS — J449 Chronic obstructive pulmonary disease, unspecified: Secondary | ICD-10-CM | POA: Diagnosis not present

## 2020-04-05 DIAGNOSIS — I129 Hypertensive chronic kidney disease with stage 1 through stage 4 chronic kidney disease, or unspecified chronic kidney disease: Secondary | ICD-10-CM | POA: Diagnosis not present

## 2020-04-05 DIAGNOSIS — S2242XD Multiple fractures of ribs, left side, subsequent encounter for fracture with routine healing: Secondary | ICD-10-CM | POA: Diagnosis not present

## 2020-04-05 DIAGNOSIS — E1022 Type 1 diabetes mellitus with diabetic chronic kidney disease: Secondary | ICD-10-CM | POA: Diagnosis not present

## 2020-04-05 DIAGNOSIS — S42202D Unspecified fracture of upper end of left humerus, subsequent encounter for fracture with routine healing: Secondary | ICD-10-CM | POA: Diagnosis not present

## 2020-04-05 DIAGNOSIS — N1832 Chronic kidney disease, stage 3b: Secondary | ICD-10-CM | POA: Diagnosis not present

## 2020-04-06 DIAGNOSIS — N1832 Chronic kidney disease, stage 3b: Secondary | ICD-10-CM | POA: Diagnosis not present

## 2020-04-06 DIAGNOSIS — E1022 Type 1 diabetes mellitus with diabetic chronic kidney disease: Secondary | ICD-10-CM | POA: Diagnosis not present

## 2020-04-06 DIAGNOSIS — S2242XD Multiple fractures of ribs, left side, subsequent encounter for fracture with routine healing: Secondary | ICD-10-CM | POA: Diagnosis not present

## 2020-04-06 DIAGNOSIS — I129 Hypertensive chronic kidney disease with stage 1 through stage 4 chronic kidney disease, or unspecified chronic kidney disease: Secondary | ICD-10-CM | POA: Diagnosis not present

## 2020-04-06 DIAGNOSIS — S42202D Unspecified fracture of upper end of left humerus, subsequent encounter for fracture with routine healing: Secondary | ICD-10-CM | POA: Diagnosis not present

## 2020-04-06 DIAGNOSIS — J449 Chronic obstructive pulmonary disease, unspecified: Secondary | ICD-10-CM | POA: Diagnosis not present

## 2020-04-08 DIAGNOSIS — E11319 Type 2 diabetes mellitus with unspecified diabetic retinopathy without macular edema: Secondary | ICD-10-CM | POA: Diagnosis not present

## 2020-04-08 DIAGNOSIS — I129 Hypertensive chronic kidney disease with stage 1 through stage 4 chronic kidney disease, or unspecified chronic kidney disease: Secondary | ICD-10-CM | POA: Diagnosis not present

## 2020-04-08 DIAGNOSIS — Z794 Long term (current) use of insulin: Secondary | ICD-10-CM | POA: Diagnosis not present

## 2020-04-08 DIAGNOSIS — I10 Essential (primary) hypertension: Secondary | ICD-10-CM | POA: Diagnosis not present

## 2020-04-09 DIAGNOSIS — E1022 Type 1 diabetes mellitus with diabetic chronic kidney disease: Secondary | ICD-10-CM | POA: Diagnosis not present

## 2020-04-09 DIAGNOSIS — S2242XD Multiple fractures of ribs, left side, subsequent encounter for fracture with routine healing: Secondary | ICD-10-CM | POA: Diagnosis not present

## 2020-04-09 DIAGNOSIS — I129 Hypertensive chronic kidney disease with stage 1 through stage 4 chronic kidney disease, or unspecified chronic kidney disease: Secondary | ICD-10-CM | POA: Diagnosis not present

## 2020-04-09 DIAGNOSIS — N1832 Chronic kidney disease, stage 3b: Secondary | ICD-10-CM | POA: Diagnosis not present

## 2020-04-09 DIAGNOSIS — J449 Chronic obstructive pulmonary disease, unspecified: Secondary | ICD-10-CM | POA: Diagnosis not present

## 2020-04-09 DIAGNOSIS — S42202D Unspecified fracture of upper end of left humerus, subsequent encounter for fracture with routine healing: Secondary | ICD-10-CM | POA: Diagnosis not present

## 2020-04-12 DIAGNOSIS — E1022 Type 1 diabetes mellitus with diabetic chronic kidney disease: Secondary | ICD-10-CM | POA: Diagnosis not present

## 2020-04-12 DIAGNOSIS — S2242XD Multiple fractures of ribs, left side, subsequent encounter for fracture with routine healing: Secondary | ICD-10-CM | POA: Diagnosis not present

## 2020-04-12 DIAGNOSIS — S42202D Unspecified fracture of upper end of left humerus, subsequent encounter for fracture with routine healing: Secondary | ICD-10-CM | POA: Diagnosis not present

## 2020-04-12 DIAGNOSIS — I129 Hypertensive chronic kidney disease with stage 1 through stage 4 chronic kidney disease, or unspecified chronic kidney disease: Secondary | ICD-10-CM | POA: Diagnosis not present

## 2020-04-12 DIAGNOSIS — N1832 Chronic kidney disease, stage 3b: Secondary | ICD-10-CM | POA: Diagnosis not present

## 2020-04-12 DIAGNOSIS — J449 Chronic obstructive pulmonary disease, unspecified: Secondary | ICD-10-CM | POA: Diagnosis not present

## 2020-04-13 DIAGNOSIS — E1022 Type 1 diabetes mellitus with diabetic chronic kidney disease: Secondary | ICD-10-CM | POA: Diagnosis not present

## 2020-04-13 DIAGNOSIS — I129 Hypertensive chronic kidney disease with stage 1 through stage 4 chronic kidney disease, or unspecified chronic kidney disease: Secondary | ICD-10-CM | POA: Diagnosis not present

## 2020-04-13 DIAGNOSIS — S2242XD Multiple fractures of ribs, left side, subsequent encounter for fracture with routine healing: Secondary | ICD-10-CM | POA: Diagnosis not present

## 2020-04-13 DIAGNOSIS — N1832 Chronic kidney disease, stage 3b: Secondary | ICD-10-CM | POA: Diagnosis not present

## 2020-04-13 DIAGNOSIS — J449 Chronic obstructive pulmonary disease, unspecified: Secondary | ICD-10-CM | POA: Diagnosis not present

## 2020-04-13 DIAGNOSIS — S42202D Unspecified fracture of upper end of left humerus, subsequent encounter for fracture with routine healing: Secondary | ICD-10-CM | POA: Diagnosis not present

## 2020-04-15 DIAGNOSIS — H903 Sensorineural hearing loss, bilateral: Secondary | ICD-10-CM | POA: Diagnosis not present

## 2020-04-15 DIAGNOSIS — I700 Atherosclerosis of aorta: Secondary | ICD-10-CM | POA: Diagnosis not present

## 2020-04-15 DIAGNOSIS — F17210 Nicotine dependence, cigarettes, uncomplicated: Secondary | ICD-10-CM | POA: Diagnosis not present

## 2020-04-15 DIAGNOSIS — D696 Thrombocytopenia, unspecified: Secondary | ICD-10-CM | POA: Diagnosis not present

## 2020-04-15 DIAGNOSIS — E1022 Type 1 diabetes mellitus with diabetic chronic kidney disease: Secondary | ICD-10-CM | POA: Diagnosis not present

## 2020-04-15 DIAGNOSIS — G894 Chronic pain syndrome: Secondary | ICD-10-CM | POA: Diagnosis not present

## 2020-04-15 DIAGNOSIS — S42202D Unspecified fracture of upper end of left humerus, subsequent encounter for fracture with routine healing: Secondary | ICD-10-CM | POA: Diagnosis not present

## 2020-04-15 DIAGNOSIS — J449 Chronic obstructive pulmonary disease, unspecified: Secondary | ICD-10-CM | POA: Diagnosis not present

## 2020-04-15 DIAGNOSIS — G4733 Obstructive sleep apnea (adult) (pediatric): Secondary | ICD-10-CM | POA: Diagnosis not present

## 2020-04-15 DIAGNOSIS — I129 Hypertensive chronic kidney disease with stage 1 through stage 4 chronic kidney disease, or unspecified chronic kidney disease: Secondary | ICD-10-CM | POA: Diagnosis not present

## 2020-04-15 DIAGNOSIS — M4126 Other idiopathic scoliosis, lumbar region: Secondary | ICD-10-CM | POA: Diagnosis not present

## 2020-04-15 DIAGNOSIS — E10319 Type 1 diabetes mellitus with unspecified diabetic retinopathy without macular edema: Secondary | ICD-10-CM | POA: Diagnosis not present

## 2020-04-15 DIAGNOSIS — Z9981 Dependence on supplemental oxygen: Secondary | ICD-10-CM | POA: Diagnosis not present

## 2020-04-15 DIAGNOSIS — E1042 Type 1 diabetes mellitus with diabetic polyneuropathy: Secondary | ICD-10-CM | POA: Diagnosis not present

## 2020-04-15 DIAGNOSIS — Z792 Long term (current) use of antibiotics: Secondary | ICD-10-CM | POA: Diagnosis not present

## 2020-04-15 DIAGNOSIS — M19032 Primary osteoarthritis, left wrist: Secondary | ICD-10-CM | POA: Diagnosis not present

## 2020-04-15 DIAGNOSIS — M47816 Spondylosis without myelopathy or radiculopathy, lumbar region: Secondary | ICD-10-CM | POA: Diagnosis not present

## 2020-04-15 DIAGNOSIS — Z79891 Long term (current) use of opiate analgesic: Secondary | ICD-10-CM | POA: Diagnosis not present

## 2020-04-15 DIAGNOSIS — F418 Other specified anxiety disorders: Secondary | ICD-10-CM | POA: Diagnosis not present

## 2020-04-15 DIAGNOSIS — S2242XD Multiple fractures of ribs, left side, subsequent encounter for fracture with routine healing: Secondary | ICD-10-CM | POA: Diagnosis not present

## 2020-04-15 DIAGNOSIS — J9611 Chronic respiratory failure with hypoxia: Secondary | ICD-10-CM | POA: Diagnosis not present

## 2020-04-15 DIAGNOSIS — N179 Acute kidney failure, unspecified: Secondary | ICD-10-CM | POA: Diagnosis not present

## 2020-04-15 DIAGNOSIS — D631 Anemia in chronic kidney disease: Secondary | ICD-10-CM | POA: Diagnosis not present

## 2020-04-15 DIAGNOSIS — N1832 Chronic kidney disease, stage 3b: Secondary | ICD-10-CM | POA: Diagnosis not present

## 2020-04-15 DIAGNOSIS — G4700 Insomnia, unspecified: Secondary | ICD-10-CM | POA: Diagnosis not present

## 2020-04-16 ENCOUNTER — Other Ambulatory Visit: Payer: Self-pay

## 2020-04-16 ENCOUNTER — Ambulatory Visit (INDEPENDENT_AMBULATORY_CARE_PROVIDER_SITE_OTHER): Payer: Medicare Other | Admitting: Podiatry

## 2020-04-16 DIAGNOSIS — I129 Hypertensive chronic kidney disease with stage 1 through stage 4 chronic kidney disease, or unspecified chronic kidney disease: Secondary | ICD-10-CM | POA: Diagnosis not present

## 2020-04-16 DIAGNOSIS — S42202D Unspecified fracture of upper end of left humerus, subsequent encounter for fracture with routine healing: Secondary | ICD-10-CM | POA: Diagnosis not present

## 2020-04-16 DIAGNOSIS — B351 Tinea unguium: Secondary | ICD-10-CM

## 2020-04-16 DIAGNOSIS — E0842 Diabetes mellitus due to underlying condition with diabetic polyneuropathy: Secondary | ICD-10-CM | POA: Diagnosis not present

## 2020-04-16 DIAGNOSIS — L89899 Pressure ulcer of other site, unspecified stage: Secondary | ICD-10-CM

## 2020-04-16 DIAGNOSIS — J449 Chronic obstructive pulmonary disease, unspecified: Secondary | ICD-10-CM | POA: Diagnosis not present

## 2020-04-16 DIAGNOSIS — S2242XD Multiple fractures of ribs, left side, subsequent encounter for fracture with routine healing: Secondary | ICD-10-CM | POA: Diagnosis not present

## 2020-04-16 DIAGNOSIS — M79676 Pain in unspecified toe(s): Secondary | ICD-10-CM

## 2020-04-16 DIAGNOSIS — E1022 Type 1 diabetes mellitus with diabetic chronic kidney disease: Secondary | ICD-10-CM | POA: Diagnosis not present

## 2020-04-16 DIAGNOSIS — N1832 Chronic kidney disease, stage 3b: Secondary | ICD-10-CM | POA: Diagnosis not present

## 2020-04-19 ENCOUNTER — Ambulatory Visit: Payer: Medicare Other | Admitting: Primary Care

## 2020-04-19 ENCOUNTER — Ambulatory Visit: Payer: Medicare Other | Admitting: Adult Health

## 2020-04-20 ENCOUNTER — Encounter: Payer: Self-pay | Admitting: Podiatry

## 2020-04-20 DIAGNOSIS — S42202D Unspecified fracture of upper end of left humerus, subsequent encounter for fracture with routine healing: Secondary | ICD-10-CM | POA: Diagnosis not present

## 2020-04-20 DIAGNOSIS — S2242XD Multiple fractures of ribs, left side, subsequent encounter for fracture with routine healing: Secondary | ICD-10-CM | POA: Diagnosis not present

## 2020-04-20 DIAGNOSIS — E1022 Type 1 diabetes mellitus with diabetic chronic kidney disease: Secondary | ICD-10-CM | POA: Diagnosis not present

## 2020-04-20 DIAGNOSIS — J449 Chronic obstructive pulmonary disease, unspecified: Secondary | ICD-10-CM | POA: Diagnosis not present

## 2020-04-20 DIAGNOSIS — I129 Hypertensive chronic kidney disease with stage 1 through stage 4 chronic kidney disease, or unspecified chronic kidney disease: Secondary | ICD-10-CM | POA: Diagnosis not present

## 2020-04-20 DIAGNOSIS — N1832 Chronic kidney disease, stage 3b: Secondary | ICD-10-CM | POA: Diagnosis not present

## 2020-04-20 NOTE — Progress Notes (Signed)
  Subjective:  Patient ID: EVIN LOISEAU, male    DOB: [birth year],  MRN: [id number]  Chief Complaint  Patient presents with  . Toe Pain    PT has two spots on the tip of his toe that he is concerned about    65 y.o. male returns for the above complaint.  Patient presents with thickened elongated dystrophic toenails x10.  Pain on palpation to the toenails.  She he would like to have them debrided down.  He denies any other acute complaints he is a diabetic.  He also has secondary pressure injury to bilateral hallux distal tip.  Patient states it came out of nowhere has now gotten worse.  He just wants to get evaluated make sure is not turning into an ulceration.  He denies any other acute complaints.  He has not tried treating it.  Objective:  There were no vitals filed for this visit. Podiatric Exam: Vascular: dorsalis pedis and posterior tibial pulses are palpable bilateral. Capillary return is immediate. Temperature gradient is WNL. Skin turgor WNL  Sensorium: Normal Semmes Weinstein monofilament test. Normal tactile sensation bilaterally. Nail Exam: Pt has thick disfigured discolored nails with subungual debris noted bilateral entire nail hallux through fifth toenails.  Pain on palpation to the nails. Ulcer Exam: There is no evidence of ulcer or pre-ulcerative changes or infection. Orthopedic Exam: Muscle tone and strength are WNL. No limitations in general ROM. No crepitus or effusions noted. HAV  B/L.  Hammer toes 2-5  B/L. Skin: No Porokeratosis. No infection or ulcers    Assessment & Plan:   1. Pressure injury of skin of toe, unspecified injury stage, unspecified laterality   2. Diabetes mellitus due to underlying condition with diabetic polyneuropathy, without long-term current use of insulin (HCC)   3. Pain due to onychomycosis of toenail     Patient was evaluated and treated and all questions answered.   Bilateral hallux pressure injury distal tip -I explained to patient the  etiology of pressure injury and various treatment options were discussed.  Ultimately I discussed with the patient that given that is diabetic his skin is very fragile and any pressure for long period of time can lead into pressure ulcer.  At this time patient does have superficial beginning injury.  I discussed with him that it could get worse over time if the pressure is not removed.  He will continue make changes to the shoes as well as changes a habit and sleeping to take the pressure off of the distal tip of the hallux.  Onychomycosis with pain  -Nails palliatively debrided as below. -Educated on self-care  Procedure: Nail Debridement Rationale: pain  Type of Debridement: manual, sharp debridement. Instrumentation: Nail nipper, rotary burr. Number of Nails: 10  Procedures and Treatment: Consent by patient was obtained for treatment procedures. The patient understood the discussion of treatment and procedures well. All questions were answered thoroughly reviewed. Debridement of mycotic and hypertrophic toenails, 1 through 5 bilateral and clearing of subungual debris. No ulceration, no infection noted.  Return Visit-Office Procedure: Patient instructed to return to the office for a follow up visit 3 months for continued evaluation and treatment.  Boneta Lucks, DPM    No follow-ups on file.

## 2020-04-21 DIAGNOSIS — S42202D Unspecified fracture of upper end of left humerus, subsequent encounter for fracture with routine healing: Secondary | ICD-10-CM | POA: Diagnosis not present

## 2020-04-21 DIAGNOSIS — E1022 Type 1 diabetes mellitus with diabetic chronic kidney disease: Secondary | ICD-10-CM | POA: Diagnosis not present

## 2020-04-21 DIAGNOSIS — M25572 Pain in left ankle and joints of left foot: Secondary | ICD-10-CM | POA: Diagnosis not present

## 2020-04-21 DIAGNOSIS — G894 Chronic pain syndrome: Secondary | ICD-10-CM | POA: Diagnosis not present

## 2020-04-21 DIAGNOSIS — E1140 Type 2 diabetes mellitus with diabetic neuropathy, unspecified: Secondary | ICD-10-CM | POA: Diagnosis not present

## 2020-04-21 DIAGNOSIS — I129 Hypertensive chronic kidney disease with stage 1 through stage 4 chronic kidney disease, or unspecified chronic kidney disease: Secondary | ICD-10-CM | POA: Diagnosis not present

## 2020-04-21 DIAGNOSIS — M19272 Secondary osteoarthritis, left ankle and foot: Secondary | ICD-10-CM | POA: Diagnosis not present

## 2020-04-21 DIAGNOSIS — J449 Chronic obstructive pulmonary disease, unspecified: Secondary | ICD-10-CM | POA: Diagnosis not present

## 2020-04-21 DIAGNOSIS — N1832 Chronic kidney disease, stage 3b: Secondary | ICD-10-CM | POA: Diagnosis not present

## 2020-04-21 DIAGNOSIS — S2242XD Multiple fractures of ribs, left side, subsequent encounter for fracture with routine healing: Secondary | ICD-10-CM | POA: Diagnosis not present

## 2020-04-21 DIAGNOSIS — S42295D Other nondisplaced fracture of upper end of left humerus, subsequent encounter for fracture with routine healing: Secondary | ICD-10-CM | POA: Diagnosis not present

## 2020-04-22 DIAGNOSIS — J449 Chronic obstructive pulmonary disease, unspecified: Secondary | ICD-10-CM | POA: Diagnosis not present

## 2020-04-22 DIAGNOSIS — S42202D Unspecified fracture of upper end of left humerus, subsequent encounter for fracture with routine healing: Secondary | ICD-10-CM | POA: Diagnosis not present

## 2020-04-22 DIAGNOSIS — S2242XD Multiple fractures of ribs, left side, subsequent encounter for fracture with routine healing: Secondary | ICD-10-CM | POA: Diagnosis not present

## 2020-04-22 DIAGNOSIS — N1832 Chronic kidney disease, stage 3b: Secondary | ICD-10-CM | POA: Diagnosis not present

## 2020-04-22 DIAGNOSIS — E1022 Type 1 diabetes mellitus with diabetic chronic kidney disease: Secondary | ICD-10-CM | POA: Diagnosis not present

## 2020-04-22 DIAGNOSIS — I129 Hypertensive chronic kidney disease with stage 1 through stage 4 chronic kidney disease, or unspecified chronic kidney disease: Secondary | ICD-10-CM | POA: Diagnosis not present

## 2020-04-23 ENCOUNTER — Other Ambulatory Visit: Payer: Self-pay | Admitting: Internal Medicine

## 2020-04-23 DIAGNOSIS — R911 Solitary pulmonary nodule: Secondary | ICD-10-CM

## 2020-04-27 DIAGNOSIS — S42295D Other nondisplaced fracture of upper end of left humerus, subsequent encounter for fracture with routine healing: Secondary | ICD-10-CM | POA: Diagnosis not present

## 2020-04-27 DIAGNOSIS — M25612 Stiffness of left shoulder, not elsewhere classified: Secondary | ICD-10-CM | POA: Diagnosis not present

## 2020-04-27 DIAGNOSIS — M6281 Muscle weakness (generalized): Secondary | ICD-10-CM | POA: Diagnosis not present

## 2020-04-28 ENCOUNTER — Ambulatory Visit: Payer: Medicare Other | Admitting: Adult Health

## 2020-05-04 DIAGNOSIS — S42295D Other nondisplaced fracture of upper end of left humerus, subsequent encounter for fracture with routine healing: Secondary | ICD-10-CM | POA: Diagnosis not present

## 2020-05-04 DIAGNOSIS — M6281 Muscle weakness (generalized): Secondary | ICD-10-CM | POA: Diagnosis not present

## 2020-05-04 DIAGNOSIS — M25612 Stiffness of left shoulder, not elsewhere classified: Secondary | ICD-10-CM | POA: Diagnosis not present

## 2020-05-06 ENCOUNTER — Other Ambulatory Visit: Payer: Medicare Other

## 2020-05-10 ENCOUNTER — Encounter: Payer: Self-pay | Admitting: Primary Care

## 2020-05-10 ENCOUNTER — Other Ambulatory Visit: Payer: Self-pay

## 2020-05-10 ENCOUNTER — Ambulatory Visit (INDEPENDENT_AMBULATORY_CARE_PROVIDER_SITE_OTHER): Payer: Medicare Other | Admitting: Primary Care

## 2020-05-10 DIAGNOSIS — G4731 Primary central sleep apnea: Secondary | ICD-10-CM

## 2020-05-10 DIAGNOSIS — J449 Chronic obstructive pulmonary disease, unspecified: Secondary | ICD-10-CM

## 2020-05-10 DIAGNOSIS — J9611 Chronic respiratory failure with hypoxia: Secondary | ICD-10-CM | POA: Diagnosis not present

## 2020-05-10 DIAGNOSIS — F17200 Nicotine dependence, unspecified, uncomplicated: Secondary | ICD-10-CM

## 2020-05-10 NOTE — Progress Notes (Signed)
@Patient  ID: [name], male    DOB: Jun 13, 1955, 65 y.o.   MRN: [id number]  Chief Complaint  Patient presents with  . Follow-up    Referring provider: Leanna Battles, MD  HPI: 65 year old male, current smoker. He had quit in February 2020 but restarted (40 pack year hx). PMH severe central sleep apnea, significant for chronic respiratory failure with hypoxia, healthcare associated pneumonia. Patient of Dr. Vaughan Browner. He saw Dr. Elsworth Soho on 01/20/20 for central apnea. Following with lung cancer screening program, LDCT 03/02/20 showed lung-RADS4A, suspicious. Ordered for 3 month follow-up CT scan due in May 2022.   05/10/2020  Patient presents today for 3 month follow-up COPD, chronic respiratory failure and central sleep apnea. His oxygen today is 79-80% RA. He wears oxygen continuously at home. He was not able to access his portable oxygen tank to bring with him today for visit. He feels well breathing wise. He experiences dyspnea with stairs and long distances. Recovers within 2 mins rest. Maintained on Trelegy for severe COPD, following with Dr. Vaughan Browner. Uses 3.5L oxygen continuously. He fell several weeks ago and injured his left arm, sling removed 2 weeks ago. Stopped Methadone. He is currently on Morphine sulfate 15mg  twice a day prescribed by Dr. Arnette Norris with pain mangement. He is still smoking 1/2 pack. Denies cough, chest tightness or wheezing.    Allergies  Allergen Reactions  . Codeine Nausea And Vomiting  . Tetracyclines & Related Hives  . Venlafaxine     Other reaction(s): sexual sxs    Immunization History  Administered Date(s) Administered  . Influenza, High Dose Seasonal PF 09/17/2019  . Moderna Sars-Covid-2 Vaccination 03/01/2019, 03/29/2019  . Tdap 09/20/2018    Past Medical History:  Diagnosis Date  . Anxiety   . Arthritis   . Chronic narcotic use   . Complication of anesthesia    h/o aspiration during back surgery  . Depression   . Diabetic peripheral neuropathy  ([place])   . Hearing loss    wears bilateral hearing aids, bilat moderate SNHL  . Hyperlipidemia   . Hypertension   . Insomnia   . Tobacco abuse   . Type 1 diabetes mellitus ([place])   . Ulcer of left ankle ([place])   . Vision abnormalities    mild DM retinopathy    Tobacco History: Social History   Tobacco Use  Smoking Status Former Smoker  . Packs/day: 1.00  . Years: 40.00  . Pack years: 40.00  . Types: Cigarettes  . Quit date: 02/16/2018  . Years since quitting: 2.2  Smokeless Tobacco Never Used  Tobacco Comment   8 ciggs daily-03/01/20-AH   Counseling given: Not Answered Comment: 8 ciggs daily-03/01/20-AH   Outpatient Medications Prior to Visit  Medication Sig Dispense Refill  . acetaminophen (TYLENOL) 650 MG CR tablet Take 650 mg by mouth daily.    Marland Kitchen albuterol (PROVENTIL) (2.5 MG/3ML) 0.083% nebulizer solution Take 3 mLs (2.5 mg total) by nebulization every 6 (six) hours as needed for wheezing or shortness of breath. 75 mL 5  . amLODipine (NORVASC) 5 MG tablet Take 1 tablet (5 mg total) by mouth daily.    Marland Kitchen aspirin EC 81 MG tablet Take 81 mg by mouth daily.    . divalproex (DEPAKOTE) 250 MG DR tablet Take 250 mg by mouth daily.    Marland Kitchen doxepin (SINEQUAN) 25 MG capsule Take 25 mg by mouth at bedtime.    . ferrous sulfate 325 (65 FE) MG tablet Take 1 tablet (325  mg total) by mouth daily with breakfast.  3  . Fluticasone-Umeclidin-Vilant (TRELEGY ELLIPTA) 100-62.5-25 MCG/INH AEPB Inhale 1 puff into the lungs daily. 60 each 11  . furosemide (LASIX) 40 MG tablet Take 40 mg by mouth every other day.    . gabapentin (NEURONTIN) 300 MG capsule Take 300 mg by mouth 2 (two) times daily.    . Insulin Glargine (BASAGLAR KWIKPEN) 100 UNIT/ML Inject 5 Units into the skin daily.    Marland Kitchen losartan (COZAAR) 50 MG tablet Take 1 tablet (50 mg total) by mouth daily.    . methadone (DOLOPHINE) 10 MG tablet Take 10-20 mg by mouth See admin instructions. Takes 2 tablets in the morning  and 1 tablet in the  afternoon.    . metoprolol tartrate (LOPRESSOR) 25 MG tablet Take 0.5 tablets (12.5 mg total) by mouth 2 (two) times daily. Take 2 tablets (50 mg) by mouth every morning and 1 tablet (25 mg) at bedtime    . OXYGEN Inhale 3.5 L into the lungs continuous.    . sertraline (ZOLOFT) 50 MG tablet Take 50 mg by mouth daily.    . simvastatin (ZOCOR) 20 MG tablet Take 20 mg by mouth daily.    . vitamin B-12 1000 MCG tablet Take 1 tablet (1,000 mcg total) by mouth daily.    . Budeson-Glycopyrrol-Formoterol (BREZTRI AEROSPHERE) 160-9-4.8 MCG/ACT AERO Inhale 2 puffs into the lungs 2 (two) times daily as needed (wheezing).     No facility-administered medications prior to visit.    Review of Systems  Review of Systems  Constitutional: Negative.   Respiratory: Positive for shortness of breath. Negative for cough and chest tightness.   Cardiovascular: Negative.    Physical Exam  BP 110/72 (BP Location: Left Arm, Cuff Size: Normal)   Pulse (!) 58   Temp 98.2 F (36.8 C) (Oral)   Ht 5\' 9"  (1.753 m)   Wt 144 lb 3.2 oz (65.4 kg)   SpO2 (!) 89%   BMI 21.29 kg/m  Physical Exam Constitutional:      Appearance: Normal appearance.  HENT:     Head: Normocephalic and atraumatic.  Cardiovascular:     Rate and Rhythm: Normal rate and regular rhythm.  Pulmonary:     Effort: Pulmonary effort is normal.     Breath sounds: Normal breath sounds.     Comments: Diminished  Musculoskeletal:        General: Normal range of motion.  Skin:    General: Skin is warm and dry.  Neurological:     General: No focal deficit present.     Mental Status: He is alert and oriented to person, place, and time. Mental status is at baseline.  Psychiatric:        Mood and Affect: Mood normal.        Behavior: Behavior normal.        Thought Content: Thought content normal.        Judgment: Judgment normal.      Lab Results:  CBC    Component Value Date/Time   WBC 3.8 (L) 03/13/2020 0259   RBC 2.85 (L)  03/13/2020 0259   HGB 8.2 (L) 03/13/2020 0259   HGB 12.1 (L) 09/27/2017 1507   HCT 25.1 (L) 03/13/2020 0259   HCT 36.7 (L) 09/27/2017 1507   PLT 148 (L) 03/13/2020 0259   PLT 173 09/27/2017 1507   MCV 88.1 03/13/2020 0259   MCV 91 09/27/2017 1507   MCH 28.8 03/13/2020 0259   MCHC 32.7  03/13/2020 0259   RDW 13.2 03/13/2020 0259   RDW 14.0 09/27/2017 1507   LYMPHSABS 0.9 03/10/2020 2136   LYMPHSABS 1.7 09/27/2017 1507   MONOABS 0.6 03/10/2020 2136   EOSABS 0.1 03/10/2020 2136   EOSABS 0.1 09/27/2017 1507   BASOSABS 0.0 03/10/2020 2136   BASOSABS 0.0 09/27/2017 1507    BMET    Component Value Date/Time   NA 141 03/13/2020 0259   NA 139 09/27/2017 1507   K 3.7 03/13/2020 0259   CL 104 03/13/2020 0259   CO2 28 03/13/2020 0259   GLUCOSE 138 (H) 03/13/2020 0259   BUN 35 (H) 03/13/2020 0259   BUN 23 09/27/2017 1507   CREATININE 1.50 (H) 03/13/2020 0259   CALCIUM 8.6 (L) 03/13/2020 0259   GFRNONAA 51 (L) 03/13/2020 0259   GFRAA 46 (L) 04/01/2019 0836    BNP No results found for: BNP  ProBNP    Component Value Date/Time   PROBNP 147.0 (H) 12/16/2019 1515    Imaging: No results found.   Assessment & Plan:   Central sleep apnea -Reviewed Dr. Elsworth Soho consult. 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 - He has been transition from Methadone to Morphine sulfate 15mg  twice daily per pain management    - He is not interested in another trial BIPAP, would likely need ASV and could trial nasal mask with chin strap but he is not interested in pursuing further treatment  - Continue to use oxygen at night   COPD, severe ([place]) - Stable; No acute respiratory symptoms. Experiences dyspnea with long distances and stairs - Maintained on Trelegy 141mcg one puff daily (RX sent to pharmacy and helping patient apply for patient assistance) - FU in 3 months with Dr. Vaughan Browner   Chronic respiratory failure with hypoxia ([place]) - O2 79-80%  RA today - Maintained on 3.5L continuously  - Reinforced importance of compliance with oxygen  Current smoker - LDCT 03/02/20 showed lung-RADS4A, suspicious - Due for CT chest in May/June 2022 to follow-up on lung nodule   Martyn Ehrich, NP 05/25/2020

## 2020-05-10 NOTE — Assessment & Plan Note (Addendum)
-  Reviewed Dr. Elsworth Soho consult. 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 - He has been transition from Methadone to Morphine sulfate 15mg  twice daily per pain management    - He is not interested in another trial BIPAP, would likely need ASV and could trial nasal mask with chin strap but he is not interested in pursuing further treatment  - Continue to use oxygen at night

## 2020-05-10 NOTE — Patient Instructions (Addendum)
Recommendations: - Continue Trelegy one puff daily in morning (rinse mouth after use) - Continue to wear 3.5L oxygen 24/7 (make sure to wear at night and use POC when going to appointments) - Due for CT chest in May/June 2022 to follow-up on lung nodule  Orders: Please help patient with patient assistance for trelegy   RX: Trelegy prescription sent to pharmacy   Follow-up: - 3 months with Dr/ Vaughan Browner

## 2020-05-17 DIAGNOSIS — S42295D Other nondisplaced fracture of upper end of left humerus, subsequent encounter for fracture with routine healing: Secondary | ICD-10-CM | POA: Diagnosis not present

## 2020-05-17 DIAGNOSIS — M25612 Stiffness of left shoulder, not elsewhere classified: Secondary | ICD-10-CM | POA: Diagnosis not present

## 2020-05-17 DIAGNOSIS — M6281 Muscle weakness (generalized): Secondary | ICD-10-CM | POA: Diagnosis not present

## 2020-05-25 DIAGNOSIS — M25612 Stiffness of left shoulder, not elsewhere classified: Secondary | ICD-10-CM | POA: Diagnosis not present

## 2020-05-25 DIAGNOSIS — S42296D Other nondisplaced fracture of upper end of unspecified humerus, subsequent encounter for fracture with routine healing: Secondary | ICD-10-CM | POA: Diagnosis not present

## 2020-05-25 DIAGNOSIS — M6281 Muscle weakness (generalized): Secondary | ICD-10-CM | POA: Diagnosis not present

## 2020-05-25 NOTE — Assessment & Plan Note (Addendum)
-   Stable; No acute respiratory symptoms. Experiences dyspnea with long distances and stairs - Maintained on Trelegy 121mcg one puff daily (RX sent to pharmacy and helping patient apply for patient assistance) - FU in 3 months with Dr. Vaughan Browner

## 2020-05-25 NOTE — Assessment & Plan Note (Signed)
-   LDCT 03/02/20 showed lung-RADS4A, suspicious - Due for CT chest in May/June 2022 to follow-up on lung nodule

## 2020-05-25 NOTE — Assessment & Plan Note (Signed)
-   O2 79-80% RA today - Maintained on 3.5L continuously  - Reinforced importance of compliance with oxygen

## 2020-05-26 DIAGNOSIS — S42295A Other nondisplaced fracture of upper end of left humerus, initial encounter for closed fracture: Secondary | ICD-10-CM | POA: Diagnosis not present

## 2020-05-26 DIAGNOSIS — M25522 Pain in left elbow: Secondary | ICD-10-CM | POA: Diagnosis not present

## 2020-05-27 DIAGNOSIS — S42295D Other nondisplaced fracture of upper end of left humerus, subsequent encounter for fracture with routine healing: Secondary | ICD-10-CM | POA: Diagnosis not present

## 2020-05-27 DIAGNOSIS — M25612 Stiffness of left shoulder, not elsewhere classified: Secondary | ICD-10-CM | POA: Diagnosis not present

## 2020-05-27 DIAGNOSIS — M6281 Muscle weakness (generalized): Secondary | ICD-10-CM | POA: Diagnosis not present

## 2020-06-03 DIAGNOSIS — M6281 Muscle weakness (generalized): Secondary | ICD-10-CM | POA: Diagnosis not present

## 2020-06-03 DIAGNOSIS — M25612 Stiffness of left shoulder, not elsewhere classified: Secondary | ICD-10-CM | POA: Diagnosis not present

## 2020-06-03 DIAGNOSIS — S42295D Other nondisplaced fracture of upper end of left humerus, subsequent encounter for fracture with routine healing: Secondary | ICD-10-CM | POA: Diagnosis not present

## 2020-06-10 DIAGNOSIS — M6281 Muscle weakness (generalized): Secondary | ICD-10-CM | POA: Diagnosis not present

## 2020-06-10 DIAGNOSIS — S42295D Other nondisplaced fracture of upper end of left humerus, subsequent encounter for fracture with routine healing: Secondary | ICD-10-CM | POA: Diagnosis not present

## 2020-06-10 DIAGNOSIS — M25612 Stiffness of left shoulder, not elsewhere classified: Secondary | ICD-10-CM | POA: Diagnosis not present

## 2020-06-11 ENCOUNTER — Ambulatory Visit
Admission: RE | Admit: 2020-06-11 | Discharge: 2020-06-11 | Disposition: A | Discharge status: Home / Self Care | Payer: Medicare Other | Source: Ambulatory Visit | Attending: Acute Care | Admitting: Acute Care

## 2020-06-11 DIAGNOSIS — Z87891 Personal history of nicotine dependence: Secondary | ICD-10-CM

## 2020-06-11 DIAGNOSIS — F17210 Nicotine dependence, cigarettes, uncomplicated: Secondary | ICD-10-CM

## 2020-06-11 DIAGNOSIS — R918 Other nonspecific abnormal finding of lung field: Secondary | ICD-10-CM | POA: Diagnosis not present

## 2020-06-15 ENCOUNTER — Telehealth: Payer: Self-pay | Admitting: Acute Care

## 2020-06-15 ENCOUNTER — Other Ambulatory Visit: Payer: Self-pay | Admitting: *Deleted

## 2020-06-15 DIAGNOSIS — F17210 Nicotine dependence, cigarettes, uncomplicated: Secondary | ICD-10-CM

## 2020-06-15 DIAGNOSIS — Z87891 Personal history of nicotine dependence: Secondary | ICD-10-CM

## 2020-06-15 NOTE — Telephone Encounter (Signed)
We will call this through the screening program. Thanks so much 

## 2020-06-15 NOTE — Telephone Encounter (Signed)
Noted.  Will close encounter.  

## 2020-06-15 NOTE — Progress Notes (Signed)
I have called the patient's wife with the results of his Low Dose CT. I explained that the nodule we were following has remained stable. However there is an additional area that is concerting, but per radiology they feel is infection/ inflammatory. Per patient's wife, who has DPR and who I spoke with, he has not been sick. No fever, no change in secretions. I explained that we will do an additional Ct in 3 months. Langley Gauss, please schedule for a tele visit with me 2 weeks prior to his 3 month follow up, so that I can treat him if he is having any symptoms prior to his scan. Thanks so much. Please send results to PCP.   Per his wife he is still smoking about 1-1.5 packs per day.

## 2020-06-15 NOTE — Telephone Encounter (Signed)
Received a call report for the lung cancer screening CT that was completed on 06/11/20. Below are the impressions from the exam.  IMPRESSION: Lung-RADS 4A, suspicious. Follow up low-dose chest CT without contrast in 3 months (please use the following order, "CT CHEST LCS NODULE FOLLOW-UP W/O CM") is recommended.  New peribronchovascular nodularity in the right lower lobe, measuring up to 10.0 mm, favoring infection. However, given size, this warrants attention on follow-up.  Prior dominant nodule in the left upper lobe is unchanged, favoring a benign etiology.  Will forward message over to Judson Roch so she is aware.

## 2020-06-22 DIAGNOSIS — S42295D Other nondisplaced fracture of upper end of left humerus, subsequent encounter for fracture with routine healing: Secondary | ICD-10-CM | POA: Diagnosis not present

## 2020-06-22 DIAGNOSIS — M6281 Muscle weakness (generalized): Secondary | ICD-10-CM | POA: Diagnosis not present

## 2020-06-22 DIAGNOSIS — M25612 Stiffness of left shoulder, not elsewhere classified: Secondary | ICD-10-CM | POA: Diagnosis not present

## 2020-06-23 ENCOUNTER — Other Ambulatory Visit: Payer: Medicare Other

## 2020-06-24 DIAGNOSIS — S42295D Other nondisplaced fracture of upper end of left humerus, subsequent encounter for fracture with routine healing: Secondary | ICD-10-CM | POA: Diagnosis not present

## 2020-06-24 DIAGNOSIS — M25612 Stiffness of left shoulder, not elsewhere classified: Secondary | ICD-10-CM | POA: Diagnosis not present

## 2020-06-24 DIAGNOSIS — M6281 Muscle weakness (generalized): Secondary | ICD-10-CM | POA: Diagnosis not present

## 2020-06-28 DIAGNOSIS — E1140 Type 2 diabetes mellitus with diabetic neuropathy, unspecified: Secondary | ICD-10-CM | POA: Diagnosis not present

## 2020-06-28 DIAGNOSIS — M25572 Pain in left ankle and joints of left foot: Secondary | ICD-10-CM | POA: Diagnosis not present

## 2020-06-28 DIAGNOSIS — M19272 Secondary osteoarthritis, left ankle and foot: Secondary | ICD-10-CM | POA: Diagnosis not present

## 2020-06-28 DIAGNOSIS — G894 Chronic pain syndrome: Secondary | ICD-10-CM | POA: Diagnosis not present

## 2020-06-29 DIAGNOSIS — S42295D Other nondisplaced fracture of upper end of left humerus, subsequent encounter for fracture with routine healing: Secondary | ICD-10-CM | POA: Diagnosis not present

## 2020-06-29 DIAGNOSIS — M25612 Stiffness of left shoulder, not elsewhere classified: Secondary | ICD-10-CM | POA: Diagnosis not present

## 2020-06-29 DIAGNOSIS — M6281 Muscle weakness (generalized): Secondary | ICD-10-CM | POA: Diagnosis not present

## 2020-06-30 DIAGNOSIS — S42295D Other nondisplaced fracture of upper end of left humerus, subsequent encounter for fracture with routine healing: Secondary | ICD-10-CM | POA: Diagnosis not present

## 2020-07-01 DIAGNOSIS — E538 Deficiency of other specified B group vitamins: Secondary | ICD-10-CM | POA: Diagnosis not present

## 2020-07-01 DIAGNOSIS — Z125 Encounter for screening for malignant neoplasm of prostate: Secondary | ICD-10-CM | POA: Diagnosis not present

## 2020-07-01 DIAGNOSIS — E785 Hyperlipidemia, unspecified: Secondary | ICD-10-CM | POA: Diagnosis not present

## 2020-07-01 DIAGNOSIS — E1151 Type 2 diabetes mellitus with diabetic peripheral angiopathy without gangrene: Secondary | ICD-10-CM | POA: Diagnosis not present

## 2020-07-01 DIAGNOSIS — E291 Testicular hypofunction: Secondary | ICD-10-CM | POA: Diagnosis not present

## 2020-07-01 DIAGNOSIS — Z0000 Encounter for general adult medical examination without abnormal findings: Secondary | ICD-10-CM | POA: Diagnosis not present

## 2020-07-06 ENCOUNTER — Other Ambulatory Visit: Payer: Self-pay | Admitting: Internal Medicine

## 2020-07-06 DIAGNOSIS — R911 Solitary pulmonary nodule: Secondary | ICD-10-CM

## 2020-07-08 DIAGNOSIS — R82998 Other abnormal findings in urine: Secondary | ICD-10-CM | POA: Diagnosis not present

## 2020-07-08 DIAGNOSIS — L03115 Cellulitis of right lower limb: Secondary | ICD-10-CM | POA: Diagnosis not present

## 2020-07-08 DIAGNOSIS — Z794 Long term (current) use of insulin: Secondary | ICD-10-CM | POA: Diagnosis not present

## 2020-07-08 DIAGNOSIS — N1831 Chronic kidney disease, stage 3a: Secondary | ICD-10-CM | POA: Diagnosis not present

## 2020-07-08 DIAGNOSIS — R2681 Unsteadiness on feet: Secondary | ICD-10-CM | POA: Diagnosis not present

## 2020-07-08 DIAGNOSIS — Z720 Tobacco use: Secondary | ICD-10-CM | POA: Diagnosis not present

## 2020-07-08 DIAGNOSIS — I739 Peripheral vascular disease, unspecified: Secondary | ICD-10-CM | POA: Diagnosis not present

## 2020-07-08 DIAGNOSIS — I10 Essential (primary) hypertension: Secondary | ICD-10-CM | POA: Diagnosis not present

## 2020-07-08 DIAGNOSIS — J449 Chronic obstructive pulmonary disease, unspecified: Secondary | ICD-10-CM | POA: Diagnosis not present

## 2020-07-08 DIAGNOSIS — Z1339 Encounter for screening examination for other mental health and behavioral disorders: Secondary | ICD-10-CM | POA: Diagnosis not present

## 2020-07-08 DIAGNOSIS — J9610 Chronic respiratory failure, unspecified whether with hypoxia or hypercapnia: Secondary | ICD-10-CM | POA: Diagnosis not present

## 2020-07-08 DIAGNOSIS — G4737 Central sleep apnea in conditions classified elsewhere: Secondary | ICD-10-CM | POA: Diagnosis not present

## 2020-07-08 DIAGNOSIS — Z1331 Encounter for screening for depression: Secondary | ICD-10-CM | POA: Diagnosis not present

## 2020-07-08 DIAGNOSIS — Z0000 Encounter for general adult medical examination without abnormal findings: Secondary | ICD-10-CM | POA: Diagnosis not present

## 2020-07-08 DIAGNOSIS — E1151 Type 2 diabetes mellitus with diabetic peripheral angiopathy without gangrene: Secondary | ICD-10-CM | POA: Diagnosis not present

## 2020-07-15 DIAGNOSIS — Z1212 Encounter for screening for malignant neoplasm of rectum: Secondary | ICD-10-CM | POA: Diagnosis not present

## 2020-07-22 ENCOUNTER — Encounter: Payer: Self-pay | Admitting: Internal Medicine

## 2020-07-26 DIAGNOSIS — M25572 Pain in left ankle and joints of left foot: Secondary | ICD-10-CM | POA: Diagnosis not present

## 2020-07-26 DIAGNOSIS — M19272 Secondary osteoarthritis, left ankle and foot: Secondary | ICD-10-CM | POA: Diagnosis not present

## 2020-07-26 DIAGNOSIS — G894 Chronic pain syndrome: Secondary | ICD-10-CM | POA: Diagnosis not present

## 2020-07-26 DIAGNOSIS — E1140 Type 2 diabetes mellitus with diabetic neuropathy, unspecified: Secondary | ICD-10-CM | POA: Diagnosis not present

## 2020-08-18 DIAGNOSIS — S42295D Other nondisplaced fracture of upper end of left humerus, subsequent encounter for fracture with routine healing: Secondary | ICD-10-CM | POA: Diagnosis not present

## 2020-08-23 DIAGNOSIS — E1140 Type 2 diabetes mellitus with diabetic neuropathy, unspecified: Secondary | ICD-10-CM | POA: Diagnosis not present

## 2020-08-23 DIAGNOSIS — G894 Chronic pain syndrome: Secondary | ICD-10-CM | POA: Diagnosis not present

## 2020-08-23 DIAGNOSIS — M19272 Secondary osteoarthritis, left ankle and foot: Secondary | ICD-10-CM | POA: Diagnosis not present

## 2020-08-23 DIAGNOSIS — M25572 Pain in left ankle and joints of left foot: Secondary | ICD-10-CM | POA: Diagnosis not present

## 2020-09-21 ENCOUNTER — Encounter: Payer: Self-pay | Admitting: Internal Medicine

## 2020-09-23 DIAGNOSIS — E1140 Type 2 diabetes mellitus with diabetic neuropathy, unspecified: Secondary | ICD-10-CM | POA: Diagnosis not present

## 2020-09-23 DIAGNOSIS — M19272 Secondary osteoarthritis, left ankle and foot: Secondary | ICD-10-CM | POA: Diagnosis not present

## 2020-09-23 DIAGNOSIS — G894 Chronic pain syndrome: Secondary | ICD-10-CM | POA: Diagnosis not present

## 2020-09-23 DIAGNOSIS — M25572 Pain in left ankle and joints of left foot: Secondary | ICD-10-CM | POA: Diagnosis not present

## 2020-09-28 DIAGNOSIS — Z1152 Encounter for screening for COVID-19: Secondary | ICD-10-CM | POA: Diagnosis not present

## 2020-09-28 DIAGNOSIS — Z720 Tobacco use: Secondary | ICD-10-CM | POA: Diagnosis not present

## 2020-09-28 DIAGNOSIS — R197 Diarrhea, unspecified: Secondary | ICD-10-CM | POA: Diagnosis not present

## 2020-09-28 DIAGNOSIS — J9610 Chronic respiratory failure, unspecified whether with hypoxia or hypercapnia: Secondary | ICD-10-CM | POA: Diagnosis not present

## 2020-09-28 DIAGNOSIS — I129 Hypertensive chronic kidney disease with stage 1 through stage 4 chronic kidney disease, or unspecified chronic kidney disease: Secondary | ICD-10-CM | POA: Diagnosis not present

## 2020-09-28 DIAGNOSIS — J441 Chronic obstructive pulmonary disease with (acute) exacerbation: Secondary | ICD-10-CM | POA: Diagnosis not present

## 2020-09-28 DIAGNOSIS — J449 Chronic obstructive pulmonary disease, unspecified: Secondary | ICD-10-CM | POA: Diagnosis not present

## 2020-09-28 DIAGNOSIS — N1831 Chronic kidney disease, stage 3a: Secondary | ICD-10-CM | POA: Diagnosis not present

## 2020-09-28 DIAGNOSIS — R051 Acute cough: Secondary | ICD-10-CM | POA: Diagnosis not present

## 2020-10-11 ENCOUNTER — Emergency Department (HOSPITAL_COMMUNITY): Payer: Medicare Other

## 2020-10-11 ENCOUNTER — Inpatient Hospital Stay (HOSPITAL_COMMUNITY)
Admission: EM | Admit: 2020-10-11 | Discharge: 2020-10-14 | DRG: 190 | Disposition: A | Discharge status: Skilled Nursing Facility | Payer: Medicare Other | Attending: Internal Medicine | Admitting: Internal Medicine

## 2020-10-11 DIAGNOSIS — W19XXXA Unspecified fall, initial encounter: Secondary | ICD-10-CM | POA: Diagnosis not present

## 2020-10-11 DIAGNOSIS — Z885 Allergy status to narcotic agent status: Secondary | ICD-10-CM

## 2020-10-11 DIAGNOSIS — N179 Acute kidney failure, unspecified: Secondary | ICD-10-CM | POA: Diagnosis not present

## 2020-10-11 DIAGNOSIS — R5383 Other fatigue: Secondary | ICD-10-CM | POA: Diagnosis not present

## 2020-10-11 DIAGNOSIS — R410 Disorientation, unspecified: Secondary | ICD-10-CM | POA: Diagnosis not present

## 2020-10-11 DIAGNOSIS — D696 Thrombocytopenia, unspecified: Secondary | ICD-10-CM | POA: Diagnosis not present

## 2020-10-11 DIAGNOSIS — Z833 Family history of diabetes mellitus: Secondary | ICD-10-CM

## 2020-10-11 DIAGNOSIS — E785 Hyperlipidemia, unspecified: Secondary | ICD-10-CM | POA: Diagnosis present

## 2020-10-11 DIAGNOSIS — E440 Moderate protein-calorie malnutrition: Secondary | ICD-10-CM | POA: Diagnosis not present

## 2020-10-11 DIAGNOSIS — J441 Chronic obstructive pulmonary disease with (acute) exacerbation: Secondary | ICD-10-CM | POA: Diagnosis not present

## 2020-10-11 DIAGNOSIS — R0602 Shortness of breath: Secondary | ICD-10-CM | POA: Diagnosis not present

## 2020-10-11 DIAGNOSIS — I129 Hypertensive chronic kidney disease with stage 1 through stage 4 chronic kidney disease, or unspecified chronic kidney disease: Secondary | ICD-10-CM | POA: Diagnosis present

## 2020-10-11 DIAGNOSIS — Z79891 Long term (current) use of opiate analgesic: Secondary | ICD-10-CM

## 2020-10-11 DIAGNOSIS — Z20822 Contact with and (suspected) exposure to covid-19: Secondary | ICD-10-CM | POA: Diagnosis present

## 2020-10-11 DIAGNOSIS — Z9621 Cochlear implant status: Secondary | ICD-10-CM | POA: Diagnosis present

## 2020-10-11 DIAGNOSIS — R0689 Other abnormalities of breathing: Secondary | ICD-10-CM | POA: Diagnosis not present

## 2020-10-11 DIAGNOSIS — N1832 Chronic kidney disease, stage 3b: Secondary | ICD-10-CM | POA: Diagnosis present

## 2020-10-11 DIAGNOSIS — Z7951 Long term (current) use of inhaled steroids: Secondary | ICD-10-CM

## 2020-10-11 DIAGNOSIS — Z681 Body mass index (BMI) 19 or less, adult: Secondary | ICD-10-CM

## 2020-10-11 DIAGNOSIS — F17210 Nicotine dependence, cigarettes, uncomplicated: Secondary | ICD-10-CM | POA: Diagnosis present

## 2020-10-11 DIAGNOSIS — Z881 Allergy status to other antibiotic agents status: Secondary | ICD-10-CM

## 2020-10-11 DIAGNOSIS — J9602 Acute respiratory failure with hypercapnia: Secondary | ICD-10-CM | POA: Diagnosis present

## 2020-10-11 DIAGNOSIS — I872 Venous insufficiency (chronic) (peripheral): Secondary | ICD-10-CM | POA: Diagnosis present

## 2020-10-11 DIAGNOSIS — M1990 Unspecified osteoarthritis, unspecified site: Secondary | ICD-10-CM | POA: Diagnosis present

## 2020-10-11 DIAGNOSIS — R0902 Hypoxemia: Secondary | ICD-10-CM | POA: Diagnosis not present

## 2020-10-11 DIAGNOSIS — L89156 Pressure-induced deep tissue damage of sacral region: Secondary | ICD-10-CM | POA: Diagnosis present

## 2020-10-11 DIAGNOSIS — R6889 Other general symptoms and signs: Secondary | ICD-10-CM | POA: Diagnosis not present

## 2020-10-11 DIAGNOSIS — F418 Other specified anxiety disorders: Secondary | ICD-10-CM | POA: Diagnosis present

## 2020-10-11 DIAGNOSIS — R627 Adult failure to thrive: Secondary | ICD-10-CM | POA: Diagnosis not present

## 2020-10-11 DIAGNOSIS — E1122 Type 2 diabetes mellitus with diabetic chronic kidney disease: Secondary | ICD-10-CM | POA: Diagnosis present

## 2020-10-11 DIAGNOSIS — Z8249 Family history of ischemic heart disease and other diseases of the circulatory system: Secondary | ICD-10-CM

## 2020-10-11 DIAGNOSIS — Z794 Long term (current) use of insulin: Secondary | ICD-10-CM

## 2020-10-11 DIAGNOSIS — E1142 Type 2 diabetes mellitus with diabetic polyneuropathy: Secondary | ICD-10-CM | POA: Diagnosis present

## 2020-10-11 DIAGNOSIS — J9601 Acute respiratory failure with hypoxia: Secondary | ICD-10-CM | POA: Diagnosis present

## 2020-10-11 DIAGNOSIS — R531 Weakness: Secondary | ICD-10-CM | POA: Diagnosis not present

## 2020-10-11 DIAGNOSIS — Z9981 Dependence on supplemental oxygen: Secondary | ICD-10-CM

## 2020-10-11 DIAGNOSIS — H9190 Unspecified hearing loss, unspecified ear: Secondary | ICD-10-CM | POA: Diagnosis present

## 2020-10-11 DIAGNOSIS — I10 Essential (primary) hypertension: Secondary | ICD-10-CM | POA: Diagnosis present

## 2020-10-11 DIAGNOSIS — G4731 Primary central sleep apnea: Secondary | ICD-10-CM | POA: Diagnosis present

## 2020-10-11 DIAGNOSIS — J9621 Acute and chronic respiratory failure with hypoxia: Secondary | ICD-10-CM | POA: Diagnosis not present

## 2020-10-11 DIAGNOSIS — D631 Anemia in chronic kidney disease: Secondary | ICD-10-CM | POA: Diagnosis present

## 2020-10-11 DIAGNOSIS — Z888 Allergy status to other drugs, medicaments and biological substances status: Secondary | ICD-10-CM

## 2020-10-11 DIAGNOSIS — J439 Emphysema, unspecified: Secondary | ICD-10-CM | POA: Diagnosis not present

## 2020-10-11 DIAGNOSIS — Z743 Need for continuous supervision: Secondary | ICD-10-CM | POA: Diagnosis not present

## 2020-10-11 DIAGNOSIS — Z79899 Other long term (current) drug therapy: Secondary | ICD-10-CM

## 2020-10-11 DIAGNOSIS — D649 Anemia, unspecified: Secondary | ICD-10-CM | POA: Diagnosis present

## 2020-10-11 DIAGNOSIS — IMO0001 Reserved for inherently not codable concepts without codable children: Secondary | ICD-10-CM

## 2020-10-11 DIAGNOSIS — Z7982 Long term (current) use of aspirin: Secondary | ICD-10-CM

## 2020-10-11 DIAGNOSIS — G4700 Insomnia, unspecified: Secondary | ICD-10-CM | POA: Diagnosis present

## 2020-10-11 LAB — URINALYSIS, MICROSCOPIC (REFLEX): Bacteria, UA: NONE SEEN

## 2020-10-11 LAB — URINALYSIS, ROUTINE W REFLEX MICROSCOPIC
Bilirubin Urine: NEGATIVE
Glucose, UA: NEGATIVE mg/dL
Ketones, ur: NEGATIVE mg/dL
Leukocytes,Ua: NEGATIVE
Nitrite: NEGATIVE
Protein, ur: 100 mg/dL — AB
Specific Gravity, Urine: >1.03 — ABNORMAL HIGH (ref 1.005–1.030)
pH: 5.5 (ref 5.0–8.0)

## 2020-10-11 LAB — COMPREHENSIVE METABOLIC PANEL
ALT: 18 U/L (ref 0–44)
AST: 24 U/L (ref 15–41)
Albumin: 2.3 g/dL — ABNORMAL LOW (ref 3.5–5.0)
Alkaline Phosphatase: 53 U/L (ref 38–126)
Anion gap: 10 (ref 5–15)
BUN: 40 mg/dL — ABNORMAL HIGH (ref 8–23)
CO2: 27 mmol/L (ref 22–32)
Calcium: 7.7 mg/dL — ABNORMAL LOW (ref 8.9–10.3)
Chloride: 101 mmol/L (ref 98–111)
Creatinine, Ser: 2.6 mg/dL — ABNORMAL HIGH (ref 0.61–1.24)
GFR, Estimated: 26 mL/min — ABNORMAL LOW (ref >60)
Glucose, Bld: 222 mg/dL — ABNORMAL HIGH (ref 70–99)
Potassium: 3.8 mmol/L (ref 3.5–5.1)
Sodium: 138 mmol/L (ref 135–145)
Total Bilirubin: 0.3 mg/dL (ref 0.3–1.2)
Total Protein: 4.8 g/dL — ABNORMAL LOW (ref 6.5–8.1)

## 2020-10-11 LAB — CBC WITH DIFFERENTIAL/PLATELET
Abs Immature Granulocytes: 0.04 10*3/uL (ref 0.00–0.07)
Basophils Absolute: 0 10*3/uL (ref 0.0–0.1)
Basophils Relative: 0 %
Eosinophils Absolute: 0 10*3/uL (ref 0.0–0.5)
Eosinophils Relative: 0 %
HCT: 28.6 % — ABNORMAL LOW (ref 39.0–52.0)
Hemoglobin: 9.3 g/dL — ABNORMAL LOW (ref 13.0–17.0)
Immature Granulocytes: 0 %
Lymphocytes Relative: 7 %
Lymphs Abs: 0.8 10*3/uL (ref 0.7–4.0)
MCH: 29.4 pg (ref 26.0–34.0)
MCHC: 32.5 g/dL (ref 30.0–36.0)
MCV: 90.5 fL (ref 80.0–100.0)
Monocytes Absolute: 0.9 10*3/uL (ref 0.1–1.0)
Monocytes Relative: 8 %
Neutro Abs: 9 10*3/uL — ABNORMAL HIGH (ref 1.7–7.7)
Neutrophils Relative %: 85 %
Platelets: 127 10*3/uL — ABNORMAL LOW (ref 150–400)
RBC: 3.16 MIL/uL — ABNORMAL LOW (ref 4.22–5.81)
RDW: 12.8 % (ref 11.5–15.5)
WBC: 10.7 10*3/uL — ABNORMAL HIGH (ref 4.0–10.5)
nRBC: 0 % (ref 0.0–0.2)

## 2020-10-11 LAB — BLOOD GAS, VENOUS
Acid-base deficit: 0.9 mmol/L (ref 0.0–2.0)
Bicarbonate: 24.6 mmol/L (ref 20.0–28.0)
O2 Saturation: 70.7 %
Patient temperature: 98.6
pCO2, Ven: 48.3 mmHg (ref 44.0–60.0)
pH, Ven: 7.327 (ref 7.250–7.430)
pO2, Ven: 42.5 mmHg (ref 32.0–45.0)

## 2020-10-11 LAB — RESP PANEL BY RT-PCR (FLU A&B, COVID) ARPGX2
Influenza A by PCR: NEGATIVE
Influenza B by PCR: NEGATIVE
SARS Coronavirus 2 by RT PCR: NEGATIVE

## 2020-10-11 MED ORDER — IPRATROPIUM-ALBUTEROL 0.5-2.5 (3) MG/3ML IN SOLN
3.0000 mL | Freq: Once | RESPIRATORY_TRACT | Status: AC
  Administered 2020-10-11: 3 mL via RESPIRATORY_TRACT
  Filled 2020-10-11: qty 3

## 2020-10-11 MED ORDER — LACTATED RINGERS IV BOLUS
1500.0000 mL | Freq: Once | INTRAVENOUS | Status: AC
Start: 2020-10-11 — End: 2020-10-12
  Administered 2020-10-11: 1500 mL via INTRAVENOUS

## 2020-10-11 NOTE — ED Triage Notes (Signed)
Pt BIB EMS from home due to wife calling to report persistent fatigue and weakness and new onset confusion. On arrival pt O2 sat was 67% on room air and he was very confused. On 5lpm [place] saturation increased to 86% so EMS placed pt on NRB at 10L and O2 sat is 95% on arrival. Pt is hard of hearing with limited mobility at baseline but has been unable to bear weight for 2 days due to extreme weakness and fatigue, new from baseline per wife.
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
## 2020-10-12 ENCOUNTER — Encounter (HOSPITAL_COMMUNITY): Payer: Self-pay | Admitting: Family Medicine

## 2020-10-12 ENCOUNTER — Other Ambulatory Visit: Payer: Self-pay

## 2020-10-12 DIAGNOSIS — J9602 Acute respiratory failure with hypercapnia: Secondary | ICD-10-CM | POA: Diagnosis present

## 2020-10-12 DIAGNOSIS — R262 Difficulty in walking, not elsewhere classified: Secondary | ICD-10-CM | POA: Diagnosis not present

## 2020-10-12 DIAGNOSIS — H9190 Unspecified hearing loss, unspecified ear: Secondary | ICD-10-CM | POA: Diagnosis present

## 2020-10-12 DIAGNOSIS — D696 Thrombocytopenia, unspecified: Secondary | ICD-10-CM

## 2020-10-12 DIAGNOSIS — Z8249 Family history of ischemic heart disease and other diseases of the circulatory system: Secondary | ICD-10-CM | POA: Diagnosis not present

## 2020-10-12 DIAGNOSIS — N179 Acute kidney failure, unspecified: Secondary | ICD-10-CM | POA: Diagnosis present

## 2020-10-12 DIAGNOSIS — E785 Hyperlipidemia, unspecified: Secondary | ICD-10-CM | POA: Diagnosis present

## 2020-10-12 DIAGNOSIS — Z9981 Dependence on supplemental oxygen: Secondary | ICD-10-CM | POA: Diagnosis not present

## 2020-10-12 DIAGNOSIS — D631 Anemia in chronic kidney disease: Secondary | ICD-10-CM | POA: Diagnosis present

## 2020-10-12 DIAGNOSIS — M6259 Muscle wasting and atrophy, not elsewhere classified, multiple sites: Secondary | ICD-10-CM | POA: Diagnosis not present

## 2020-10-12 DIAGNOSIS — E1122 Type 2 diabetes mellitus with diabetic chronic kidney disease: Secondary | ICD-10-CM | POA: Diagnosis present

## 2020-10-12 DIAGNOSIS — Z20822 Contact with and (suspected) exposure to covid-19: Secondary | ICD-10-CM | POA: Diagnosis present

## 2020-10-12 DIAGNOSIS — Z681 Body mass index (BMI) 19 or less, adult: Secondary | ICD-10-CM | POA: Diagnosis not present

## 2020-10-12 DIAGNOSIS — Z743 Need for continuous supervision: Secondary | ICD-10-CM | POA: Diagnosis not present

## 2020-10-12 DIAGNOSIS — G4700 Insomnia, unspecified: Secondary | ICD-10-CM | POA: Diagnosis present

## 2020-10-12 DIAGNOSIS — E1142 Type 2 diabetes mellitus with diabetic polyneuropathy: Secondary | ICD-10-CM | POA: Diagnosis present

## 2020-10-12 DIAGNOSIS — I129 Hypertensive chronic kidney disease with stage 1 through stage 4 chronic kidney disease, or unspecified chronic kidney disease: Secondary | ICD-10-CM

## 2020-10-12 DIAGNOSIS — J9621 Acute and chronic respiratory failure with hypoxia: Secondary | ICD-10-CM | POA: Diagnosis present

## 2020-10-12 DIAGNOSIS — E440 Moderate protein-calorie malnutrition: Secondary | ICD-10-CM | POA: Diagnosis present

## 2020-10-12 DIAGNOSIS — I10 Essential (primary) hypertension: Secondary | ICD-10-CM | POA: Diagnosis not present

## 2020-10-12 DIAGNOSIS — J441 Chronic obstructive pulmonary disease with (acute) exacerbation: Secondary | ICD-10-CM | POA: Diagnosis present

## 2020-10-12 DIAGNOSIS — M1990 Unspecified osteoarthritis, unspecified site: Secondary | ICD-10-CM | POA: Diagnosis present

## 2020-10-12 DIAGNOSIS — L89156 Pressure-induced deep tissue damage of sacral region: Secondary | ICD-10-CM | POA: Diagnosis present

## 2020-10-12 DIAGNOSIS — N1832 Chronic kidney disease, stage 3b: Secondary | ICD-10-CM

## 2020-10-12 DIAGNOSIS — Z794 Long term (current) use of insulin: Secondary | ICD-10-CM | POA: Diagnosis not present

## 2020-10-12 DIAGNOSIS — R41841 Cognitive communication deficit: Secondary | ICD-10-CM | POA: Diagnosis not present

## 2020-10-12 DIAGNOSIS — J9601 Acute respiratory failure with hypoxia: Secondary | ICD-10-CM | POA: Diagnosis not present

## 2020-10-12 DIAGNOSIS — R0902 Hypoxemia: Secondary | ICD-10-CM | POA: Diagnosis not present

## 2020-10-12 DIAGNOSIS — R2689 Other abnormalities of gait and mobility: Secondary | ICD-10-CM | POA: Diagnosis not present

## 2020-10-12 DIAGNOSIS — F17210 Nicotine dependence, cigarettes, uncomplicated: Secondary | ICD-10-CM | POA: Diagnosis present

## 2020-10-12 DIAGNOSIS — IMO0001 Reserved for inherently not codable concepts without codable children: Secondary | ICD-10-CM

## 2020-10-12 DIAGNOSIS — D649 Anemia, unspecified: Secondary | ICD-10-CM | POA: Diagnosis not present

## 2020-10-12 DIAGNOSIS — R498 Other voice and resonance disorders: Secondary | ICD-10-CM | POA: Diagnosis not present

## 2020-10-12 DIAGNOSIS — M6281 Muscle weakness (generalized): Secondary | ICD-10-CM | POA: Diagnosis not present

## 2020-10-12 DIAGNOSIS — R1312 Dysphagia, oropharyngeal phase: Secondary | ICD-10-CM | POA: Diagnosis not present

## 2020-10-12 DIAGNOSIS — R627 Adult failure to thrive: Secondary | ICD-10-CM | POA: Diagnosis present

## 2020-10-12 DIAGNOSIS — F418 Other specified anxiety disorders: Secondary | ICD-10-CM | POA: Diagnosis present

## 2020-10-12 DIAGNOSIS — Z833 Family history of diabetes mellitus: Secondary | ICD-10-CM | POA: Diagnosis not present

## 2020-10-12 DIAGNOSIS — J9600 Acute respiratory failure, unspecified whether with hypoxia or hypercapnia: Secondary | ICD-10-CM | POA: Diagnosis not present

## 2020-10-12 LAB — HEMOGLOBIN A1C
Hgb A1c MFr Bld: 7.4 % — ABNORMAL HIGH (ref 4.8–5.6)
Mean Plasma Glucose: 165.68 mg/dL

## 2020-10-12 LAB — BASIC METABOLIC PANEL
Anion gap: 6 (ref 5–15)
BUN: 41 mg/dL — ABNORMAL HIGH (ref 8–23)
CO2: 31 mmol/L (ref 22–32)
Calcium: 7.7 mg/dL — ABNORMAL LOW (ref 8.9–10.3)
Chloride: 105 mmol/L (ref 98–111)
Creatinine, Ser: 2.27 mg/dL — ABNORMAL HIGH (ref 0.61–1.24)
GFR, Estimated: 31 mL/min — ABNORMAL LOW (ref >60)
Glucose, Bld: 166 mg/dL — ABNORMAL HIGH (ref 70–99)
Potassium: 3.7 mmol/L (ref 3.5–5.1)
Sodium: 142 mmol/L (ref 135–145)

## 2020-10-12 LAB — GLUCOSE, CAPILLARY
Glucose-Capillary: 171 mg/dL — ABNORMAL HIGH (ref 70–99)
Glucose-Capillary: 205 mg/dL — ABNORMAL HIGH (ref 70–99)

## 2020-10-12 LAB — CBG MONITORING, ED
Glucose-Capillary: 160 mg/dL — ABNORMAL HIGH (ref 70–99)
Glucose-Capillary: 187 mg/dL — ABNORMAL HIGH (ref 70–99)
Glucose-Capillary: 251 mg/dL — ABNORMAL HIGH (ref 70–99)

## 2020-10-12 MED ORDER — FLUTICASONE-UMECLIDIN-VILANT 100-62.5-25 MCG/INH IN AEPB: 1.0000 | INHALATION_SPRAY | Freq: Every day | RESPIRATORY_TRACT | Status: DC

## 2020-10-12 MED ORDER — INSULIN ASPART 100 UNIT/ML IJ SOLN
0.0000 [IU] | Freq: Three times a day (TID) | INTRAMUSCULAR | Status: DC
  Administered 2020-10-12: 5 [IU] via SUBCUTANEOUS
  Administered 2020-10-12 – 2020-10-13 (×3): 2 [IU] via SUBCUTANEOUS
  Administered 2020-10-13: 5 [IU] via SUBCUTANEOUS
  Administered 2020-10-14: 3 [IU] via SUBCUTANEOUS
  Administered 2020-10-14: 7 [IU] via SUBCUTANEOUS
  Administered 2020-10-14: 2 [IU] via SUBCUTANEOUS
  Filled 2020-10-12: qty 0.09

## 2020-10-12 MED ORDER — UMECLIDINIUM BROMIDE 62.5 MCG/INH IN AEPB
1.0000 | INHALATION_SPRAY | Freq: Every day | RESPIRATORY_TRACT | Status: DC
  Administered 2020-10-12 – 2020-10-14 (×3): 1 via RESPIRATORY_TRACT
  Filled 2020-10-12: qty 7

## 2020-10-12 MED ORDER — SERTRALINE HCL 50 MG PO TABS
50.0000 mg | ORAL_TABLET | Freq: Every day | ORAL | Status: DC
  Administered 2020-10-12 – 2020-10-13 (×2): 50 mg via ORAL
  Filled 2020-10-12 (×3): qty 1

## 2020-10-12 MED ORDER — AMLODIPINE BESYLATE 5 MG PO TABS: 5.0000 mg | ORAL_TABLET | Freq: Every day | ORAL | Status: DC

## 2020-10-12 MED ORDER — GABAPENTIN 300 MG PO CAPS
300.0000 mg | ORAL_CAPSULE | Freq: Two times a day (BID) | ORAL | Status: DC
  Administered 2020-10-12 – 2020-10-14 (×4): 300 mg via ORAL
  Filled 2020-10-12 (×5): qty 1

## 2020-10-12 MED ORDER — SIMVASTATIN 20 MG PO TABS
20.0000 mg | ORAL_TABLET | Freq: Every day | ORAL | Status: DC
  Administered 2020-10-12 – 2020-10-14 (×3): 20 mg via ORAL
  Filled 2020-10-12 (×3): qty 1

## 2020-10-12 MED ORDER — LOSARTAN POTASSIUM 25 MG PO TABS: 100.0000 mg | ORAL_TABLET | Freq: Every day | ORAL | Status: DC

## 2020-10-12 MED ORDER — DOXEPIN HCL 10 MG PO CAPS
10.0000 mg | ORAL_CAPSULE | Freq: Every day | ORAL | Status: DC
  Administered 2020-10-12 – 2020-10-13 (×2): 10 mg via ORAL
  Filled 2020-10-12 (×3): qty 1

## 2020-10-12 MED ORDER — ASPIRIN EC 81 MG PO TBEC
81.0000 mg | DELAYED_RELEASE_TABLET | Freq: Every day | ORAL | Status: DC
  Administered 2020-10-12 – 2020-10-14 (×3): 81 mg via ORAL
  Filled 2020-10-12 (×3): qty 1

## 2020-10-12 MED ORDER — METHYLPREDNISOLONE SODIUM SUCC 40 MG IJ SOLR
40.0000 mg | Freq: Every day | INTRAMUSCULAR | Status: DC
  Administered 2020-10-12 – 2020-10-14 (×3): 40 mg via INTRAVENOUS
  Filled 2020-10-12 (×3): qty 1

## 2020-10-12 MED ORDER — MORPHINE SULFATE ER 15 MG PO TBCR
15.0000 mg | EXTENDED_RELEASE_TABLET | Freq: Three times a day (TID) | ORAL | Status: DC
  Administered 2020-10-12 – 2020-10-14 (×7): 15 mg via ORAL
  Filled 2020-10-12 (×7): qty 1

## 2020-10-12 MED ORDER — ORAL CARE MOUTH RINSE
15.0000 mL | Freq: Two times a day (BID) | OROMUCOSAL | Status: DC
  Administered 2020-10-12 – 2020-10-14 (×5): 15 mL via OROMUCOSAL

## 2020-10-12 MED ORDER — FLUTICASONE FUROATE-VILANTEROL 100-25 MCG/INH IN AEPB
1.0000 | INHALATION_SPRAY | Freq: Every day | RESPIRATORY_TRACT | Status: DC
  Administered 2020-10-12 – 2020-10-14 (×3): 1 via RESPIRATORY_TRACT
  Filled 2020-10-12: qty 28

## 2020-10-12 MED ORDER — HEPARIN SODIUM (PORCINE) 5000 UNIT/ML IJ SOLN
5000.0000 [IU] | Freq: Three times a day (TID) | INTRAMUSCULAR | Status: DC
  Administered 2020-10-12 – 2020-10-14 (×8): 5000 [IU] via SUBCUTANEOUS
  Filled 2020-10-12 (×8): qty 1

## 2020-10-12 MED ORDER — DIVALPROEX SODIUM 250 MG PO DR TAB
250.0000 mg | DELAYED_RELEASE_TABLET | Freq: Every day | ORAL | Status: DC
  Administered 2020-10-12 – 2020-10-14 (×3): 250 mg via ORAL
  Filled 2020-10-12 (×3): qty 1

## 2020-10-12 MED ORDER — IPRATROPIUM-ALBUTEROL 0.5-2.5 (3) MG/3ML IN SOLN
3.0000 mL | Freq: Four times a day (QID) | RESPIRATORY_TRACT | Status: DC | PRN
  Administered 2020-10-12: 3 mL via RESPIRATORY_TRACT
  Filled 2020-10-12: qty 3

## 2020-10-12 NOTE — ED Notes (Signed)
This RN was called into room by NT for assistance. Upon entry, pt's Aten was off and pt was unsteady sitting on the side of the bed with NT attempting to redirect into bed. Pt confused and when O2 reading was obtained, sats were 82%. Increased [place] to 6L, however SpO2 only increased to 89% and pt still unsteady. Switched to 15L nonrebreather and repositioned pt in bed, placed primofit. Pt increasingly more alert and following instructions, O2 up to 99%. Attempted to change back to 6L [place] but O2 dropped to 90%. Started duoneb (see MAR).

## 2020-10-12 NOTE — H&P (Signed)
History and Physical    [name] LXB:[id number] DOB: 10/10/1955 DOA: 10/11/2020  PCP: Alan Battles, MD  Patient coming from: Home  I have personally briefly reviewed patient's old medical records in [place]  Chief Complaint: Increasing shortness of breath  HPI: [name] is a 65 y.o. male with medical history significant for asthma/severe COPD with chronic hypoxemia on 3.5 L during the day, 3 L at night, type 2 diabetes, CKD stage IIIb, PVD and hypertension who presents with concerns of increasing shortness of breath.  Had on and off shortness of breath in the past week but acutely worsened yesterday.  Worse with exertion.  Has chronic cough but is not worse.  Denies any fever.  Patient continues to smoke about half a pack per day.  He denies any chest pain or tightness.  No new worsening lower extremity edema.    Denies any nausea, vomiting or diarrhea.Patient reports he mostly is sedentary.  Patient reportedly found by EMS with oxygen saturation of 67% while on his home oxygen.  In the ED, he was placed on 10 L nonrebreather and eventually able to wean back down to his home 3.5 L after treatment with DuoNeb. WBC of 10.7, hemoglobin at baseline of 9.3, platelet of 127 which is chronic.  A sodium of 138, potassium 3.1, creatinine of 2.6 from 1.5, BG of 222 VBG with pH of 7.32 and CO2 of 48  Chest x-ray was negative.  COVID PCR/flu negative.  Patient has received all 4 doses of COVID-vaccine.  Wife who is currently not at bedside endorsed confusion to ED provider.  CT head was negative.   Review of Systems: Constitutional: No Weight Change, No Fever ENT/Mouth: No sore throat, No Rhinorrhea Eyes: No Eye Pain, No Vision Changes Cardiovascular: No Chest Pain, + SOB, No PND, + Dyspnea on Exertion, No Orthopnea, No Edema, No Palpitations Respiratory: +Cough, No Sputum, Gastrointestinal: No Nausea, No Vomiting, No Diarrhea,  No Pain Genitourinary: no Urinary  Incontinence,  Musculoskeletal: No Arthralgias, No Myalgias Skin: No Skin Lesions, No Pruritus, Neuro: no Weakness, No Numbness Psych: No Anxiety/Panic, No Depression, no decrease appetite Heme/Lymph: No Bruising, No Bleeding  Past Medical History:  Diagnosis Date   Anxiety    Arthritis    Chronic narcotic use    Complication of anesthesia    h/o aspiration during back surgery   Depression    Diabetic peripheral neuropathy (HCC)    Hearing loss    wears bilateral hearing aids, bilat moderate SNHL   Hyperlipidemia    Hypertension    Insomnia    Tobacco abuse    Type 1 diabetes mellitus (HCC)    Ulcer of left ankle (HCC)    Vision abnormalities    mild DM retinopathy    Past Surgical History:  Procedure Laterality Date   COCHLEAR IMPLANT Left 10/31/2017   Procedure: COCHLEAR IMPLANT LEFT EAR;  Surgeon: Alan Mutters, MD;  Location: [facility];  Service: ENT;  Laterality: Left;   COLONOSCOPY WITH PROPOFOL  07-26-2010   [name] had 16 tons of steel rolled over his feet   LUMBAR LAMINECTOMY/DECOMPRESSION MICRODISCECTOMY  12-23-1997   LEFT  L5 -- S1   TONSILLECTOMY       reports that he quit smoking about 2 years ago. His smoking use included cigarettes. He has a 40.00 pack-year smoking history. He has never used smokeless tobacco. He reports that he does not drink alcohol and does  not use drugs. Social History  Allergies  Allergen Reactions   Codeine Nausea And Vomiting   Tetracyclines & Related Hives   Venlafaxine     Other reaction(s): sexual sxs    Family History  Problem Relation Age of Onset   Prostate cancer Paternal Uncle    Prostate cancer Maternal Grandfather    Diabetes Mother    Hypertension Mother    Diabetes Father    Heart failure Father      Prior to Admission medications   Medication Sig Start Date End Date Taking? Authorizing Provider  acetaminophen (TYLENOL) 650 MG CR tablet Take 650 mg by mouth daily.     [provider]  albuterol (PROVENTIL) (2.5 MG/3ML) 0.083% nebulizer solution Take 3 mLs (2.5 mg total) by nebulization every 6 (six) hours as needed for wheezing or shortness of breath. 12/16/19   Alan Ehrich, NP  amLODipine (NORVASC) 5 MG tablet Take 1 tablet (5 mg total) by mouth daily. 03/15/20   Alan Friendly, MD  aspirin EC 81 MG tablet Take 81 mg by mouth daily.    [provider]  divalproex (DEPAKOTE) 250 MG DR tablet Take 250 mg by mouth daily.    [provider]  doxepin (SINEQUAN) 25 MG capsule Take 25 mg by mouth at bedtime.    [provider]  ferrous sulfate 325 (65 FE) MG tablet Take 1 tablet (325 mg total) by mouth daily with breakfast. 03/16/20   Alan Friendly, MD  Fluticasone-Umeclidin-Vilant (TRELEGY ELLIPTA) 100-62.5-25 MCG/INH AEPB Inhale 1 puff into the lungs daily. 01/20/20   Alan Noel, MD  furosemide (LASIX) 40 MG tablet Take 40 mg by mouth every other day.    [provider]  gabapentin (NEURONTIN) 300 MG capsule Take 300 mg by mouth 2 (two) times daily.    [provider]  Insulin Glargine (BASAGLAR KWIKPEN) 100 UNIT/ML Inject 5 Units into the skin daily. 03/15/20   Alan Friendly, MD  losartan (COZAAR) 50 MG tablet Take 1 tablet (50 mg total) by mouth daily. 03/15/20   Alan Friendly, MD  methadone (DOLOPHINE) 10 MG tablet Take 10-20 mg by mouth See admin instructions. Takes 2 tablets in the morning  and 1 tablet in the afternoon.    [provider]  metoprolol tartrate (LOPRESSOR) 25 MG tablet Take 0.5 tablets (12.5 mg total) by mouth 2 (two) times daily. Take 2 tablets (50 mg) by mouth every morning and 1 tablet (25 mg) at bedtime 03/15/20   Alan Friendly, MD  OXYGEN Inhale 3.5 L into the lungs continuous.    [provider]  sertraline (ZOLOFT) 50 MG tablet Take 50 mg by mouth daily.    [provider]  simvastatin (ZOCOR) 20 MG tablet Take 20 mg by  mouth daily.    [provider]  vitamin B-12 1000 MCG tablet Take 1 tablet (1,000 mcg total) by mouth daily. 03/16/20   Alan Friendly, MD    Physical Exam: Vitals:   10/11/20 2145 10/11/20 2215 10/11/20 2245 10/11/20 2315  BP: (!) 113/54 (!) 118/49 (!) 88/48 (!) 152/47  Pulse: 92 64 66 78  Resp: 19 19 18 17   Temp:      TempSrc:      SpO2: 94% 95% 98% 92%  Weight:      Height:        Constitutional: NAD, calm, comfortable, chronically ill-appearing elderly male lying at approximately 20 decline on 3.5 L oxygen via  nasal Vitals:   10/11/20 2145 10/11/20 2215 10/11/20 2245 10/11/20 2315  BP: (!) 113/54 (!) 118/49 (!) 88/48 (!) 152/47  Pulse: 92 64 66 78  Resp: 19 19 18 17   Temp:      TempSrc:      SpO2: 94% 95% 98% 92%  Weight:      Height:       Eyes: PERRL, lids and conjunctivae normal ENMT: Mucous membranes are moist.  Neck: normal, supple Respiratory: clear to auscultation bilaterally, no wheezing, no crackles. Normal respiratory effort on 3.5 L via nasal cannula.  Able to speak in full sentences. no accessory muscle use.  Cardiovascular: Regular rate and rhythm, no murmurs / rubs / gallops. No extremity edema. 2+ pedal pulses. No carotid bruits.  Abdomen: no tenderness, no masses palpated. Bowel sounds positive.  Musculoskeletal: no clubbing / cyanosis. No joint deformity upper and lower extremities.  Skin: no rashes,  Neurologic: CN 2-12 grossly intact. Sensation intact,  Strength 5/5 in all 4.  Psychiatric: Normal judgment and insight. Alert and oriented x 3. Normal mood.    Labs on Admission: I have personally reviewed following labs and imaging studies  CBC: Recent Labs  Lab 10/11/20 1812  WBC 10.7*  NEUTROABS 9.0*  HGB 9.3*  HCT 28.6*  MCV 90.5  PLT 818*   Basic Metabolic Panel: Recent Labs  Lab 10/11/20 1812  NA 138  K 3.8  CL 101  CO2 27  GLUCOSE 222*  BUN 40*  CREATININE 2.60*  CALCIUM 7.7*   GFR: Estimated Creatinine  Clearance: 25.7 mL/min (A) (by C-G formula based on SCr of 2.6 mg/dL (H)). Liver Function Tests: Recent Labs  Lab 10/11/20 1812  AST 24  ALT 18  ALKPHOS 53  BILITOT 0.3  PROT 4.8*  ALBUMIN 2.3*   No results for input(s): LIPASE, AMYLASE in the last 168 hours. No results for input(s): AMMONIA in the last 168 hours. Coagulation Profile: No results for input(s): INR, PROTIME in the last 168 hours. Cardiac Enzymes: No results for input(s): CKTOTAL, CKMB, CKMBINDEX, TROPONINI in the last 168 hours. BNP (last 3 results) Recent Labs    12/16/19 1515  PROBNP 147.0*   HbA1C: No results for input(s): HGBA1C in the last 72 hours. CBG: No results for input(s): GLUCAP in the last 168 hours. Lipid Profile: No results for input(s): CHOL, HDL, LDLCALC, TRIG, CHOLHDL, LDLDIRECT in the last 72 hours. Thyroid Function Tests: No results for input(s): TSH, T4TOTAL, FREET4, T3FREE, THYROIDAB in the last 72 hours. Anemia Panel: No results for input(s): VITAMINB12, FOLATE, FERRITIN, TIBC, IRON, RETICCTPCT in the last 72 hours. Urine analysis:    Component Value Date/Time   COLORURINE YELLOW 10/11/2020 2237   APPEARANCEUR CLEAR 10/11/2020 2237   LABSPEC >1.030 (H) 10/11/2020 2237   PHURINE 5.5 10/11/2020 2237   GLUCOSEU NEGATIVE 10/11/2020 2237   HGBUR TRACE (A) 10/11/2020 2237   BILIRUBINUR NEGATIVE 10/11/2020 2237   KETONESUR NEGATIVE 10/11/2020 2237   PROTEINUR 100 (A) 10/11/2020 2237   NITRITE NEGATIVE 10/11/2020 2237   LEUKOCYTESUR NEGATIVE 10/11/2020 2237    Radiological Exams on Admission: CT HEAD WO CONTRAST (5MM)  Result Date: 10/11/2020 CLINICAL DATA:  Weakness and confusion EXAM: CT HEAD WITHOUT CONTRAST TECHNIQUE: Contiguous axial images were obtained from the base of the skull through the vertex without intravenous contrast. COMPARISON:  03/10/2020 FINDINGS: Streak artifact from cochlear implant device on the LEFT. Brain: No acute intracranial hemorrhage. No focal mass  lesion. No CT evidence of acute infarction. No  midline shift or mass effect. No hydrocephalus. Basilar cisterns are patent. Vascular: No hyperdense vessel or unexpected calcification. Skull: Normal. Negative for fracture or focal lesion. Sinuses/Orbits: Paranasal sinusesare clear. Orbits are clear. Small fluid in the inferior LEFT mastoid air cells unchanged from prior. Postsurgical change in the mastoid related to cochlear implant Other: None. IMPRESSION: 1. No acute intracranial findings. Electronically Signed   By: Suzy Bouchard M.D.   On: 10/11/2020 19:05   DG Chest Portable 1 View  Result Date: 10/11/2020 CLINICAL DATA:  Fatigue, weakness, confusion, short of breath EXAM: PORTABLE CHEST 1 VIEW COMPARISON:  03/10/2020, 06/11/2020 FINDINGS: Single frontal view of the chest demonstrates a stable cardiac silhouette. Background emphysema again noted. No acute airspace disease, effusion, or pneumothorax. There are no acute displaced fractures. IMPRESSION: 1. Emphysema, no acute airspace disease. 2. Please refer to previous chest CT report 06/11/2020 recommending three-month CT follow-up for right lower lobe nodularity. Electronically Signed   By: Randa Ngo M.D.   On: 10/11/2020 19:19      Assessment/Plan  Acute on chronic hypoxemic respiratory failure secondary to COPD exacerbation -Patient on 3.5 L in the morning, 3 L at night at baseline - Initially required 10 L on nonrebreather but able to wean back down to home oxygen after DuoNeb treatment - Continue PRN DuoNeb every 6hr - Start IV Solu-Medrol - No other symptoms other than dyspnea to warrant any IV antibiotics  AKI on CKD stage IIIb - Creatinine of 2.6 from a prior 1.5 - Received 1 L of normal saline fluid in the ED.  Follow with repeat creatinine and avoid nephrotoxic agent  Hypertension -Continue amlodipine, hold losartan due to AKI  Type 2 diabetes - Hypoglycemic on arrival - Start sensitive sliding scale  Chronic  anemia - Hemoglobin stable at baseline of 9.3.  Likely anemia of chronic disease  Thrombocytopenia - Plt 127 which is chronic  DVT prophylaxis:.Lovenox Code Status: Full Family Communication: Plan discussed with patient at bedside  disposition Plan: Home with at least 2 midnight stays  Consults called:  Admission status: inpatient  Level of care: Progressive  Status is: Inpatient  Remains inpatient appropriate because:Inpatient level of care appropriate due to severity of illness  Dispo: The patient is from: Home              Anticipated d/c is to: Home              Patient currently is not medically stable to d/c.   Difficult to place patient No         Orene Desanctis DO [facility]   If 7PM-7AM, please contact night-coverage [email or web address]   10/12/2020, 12:25 AM

## 2020-10-12 NOTE — ED Notes (Signed)
Pt A&O x4. NAD. VSS. Attached to cardiac monitor x3.

## 2020-10-12 NOTE — Progress Notes (Signed)
Pt seen by Dr Flossie Buffy earlier this am please note for detailed H&P      [name] is a 65 y.o. male with medical history significant for asthma/severe COPD with chronic hypoxemia on 3.5 L during the day, 3 L at night, type 2 diabetes, CKD stage IIIb, PVD and hypertension who presents with concerns of increasing shortness of breath. He was admitted for acute on chronic respiratory failure sec to copd exacerbation.  He was started on IV steroids and duo nebs.  Continue the same.   Hosie Poisson, MD

## 2020-10-12 NOTE — ED Notes (Addendum)
Pt called out for restroom assistance. I assisted pt with standing and using urinal, however when pt was finished, it became apparent that he was more altered than he was on previous interactions with him and his oxygen saturation was low, however sensor was not reading accurately. Notified RN, who came to bedside. Primofit placed and pt was changed into a hospital gown.

## 2020-10-12 NOTE — ED Notes (Signed)
Report sent to floor nurse.  

## 2020-10-13 DIAGNOSIS — J9601 Acute respiratory failure with hypoxia: Secondary | ICD-10-CM | POA: Diagnosis not present

## 2020-10-13 DIAGNOSIS — N179 Acute kidney failure, unspecified: Secondary | ICD-10-CM | POA: Diagnosis not present

## 2020-10-13 DIAGNOSIS — J441 Chronic obstructive pulmonary disease with (acute) exacerbation: Secondary | ICD-10-CM | POA: Diagnosis not present

## 2020-10-13 DIAGNOSIS — N1832 Chronic kidney disease, stage 3b: Secondary | ICD-10-CM | POA: Diagnosis not present

## 2020-10-13 LAB — CBC WITH DIFFERENTIAL/PLATELET
Abs Immature Granulocytes: 0.05 10*3/uL (ref 0.00–0.07)
Basophils Absolute: 0 10*3/uL (ref 0.0–0.1)
Basophils Relative: 0 %
Eosinophils Absolute: 0 10*3/uL (ref 0.0–0.5)
Eosinophils Relative: 0 %
HCT: 28 % — ABNORMAL LOW (ref 39.0–52.0)
Hemoglobin: 9.2 g/dL — ABNORMAL LOW (ref 13.0–17.0)
Immature Granulocytes: 1 %
Lymphocytes Relative: 11 %
Lymphs Abs: 0.6 10*3/uL — ABNORMAL LOW (ref 0.7–4.0)
MCH: 28.8 pg (ref 26.0–34.0)
MCHC: 32.9 g/dL (ref 30.0–36.0)
MCV: 87.8 fL (ref 80.0–100.0)
Monocytes Absolute: 0.2 10*3/uL (ref 0.1–1.0)
Monocytes Relative: 4 %
Neutro Abs: 4.8 10*3/uL (ref 1.7–7.7)
Neutrophils Relative %: 84 %
Platelets: 140 10*3/uL — ABNORMAL LOW (ref 150–400)
RBC: 3.19 MIL/uL — ABNORMAL LOW (ref 4.22–5.81)
RDW: 12.6 % (ref 11.5–15.5)
WBC: 5.7 10*3/uL (ref 4.0–10.5)
nRBC: 0 % (ref 0.0–0.2)

## 2020-10-13 LAB — BASIC METABOLIC PANEL
Anion gap: 9 (ref 5–15)
BUN: 41 mg/dL — ABNORMAL HIGH (ref 8–23)
CO2: 28 mmol/L (ref 22–32)
Calcium: 8.2 mg/dL — ABNORMAL LOW (ref 8.9–10.3)
Chloride: 102 mmol/L (ref 98–111)
Creatinine, Ser: 1.72 mg/dL — ABNORMAL HIGH (ref 0.61–1.24)
GFR, Estimated: 43 mL/min — ABNORMAL LOW (ref >60)
Glucose, Bld: 191 mg/dL — ABNORMAL HIGH (ref 70–99)
Potassium: 4.3 mmol/L (ref 3.5–5.1)
Sodium: 139 mmol/L (ref 135–145)

## 2020-10-13 LAB — GLUCOSE, CAPILLARY
Glucose-Capillary: 163 mg/dL — ABNORMAL HIGH (ref 70–99)
Glucose-Capillary: 163 mg/dL — ABNORMAL HIGH (ref 70–99)
Glucose-Capillary: 268 mg/dL — ABNORMAL HIGH (ref 70–99)
Glucose-Capillary: 327 mg/dL — ABNORMAL HIGH (ref 70–99)

## 2020-10-13 MED ORDER — PREDNISONE 10 MG PO TABS: 40.0000 mg | ORAL_TABLET | Freq: Every day | ORAL | 0 refills | Status: AC

## 2020-10-13 MED ORDER — LORATADINE 10 MG PO TABS
10.0000 mg | ORAL_TABLET | Freq: Every day | ORAL | Status: DC
  Administered 2020-10-13 – 2020-10-14 (×2): 10 mg via ORAL
  Filled 2020-10-13 (×2): qty 1

## 2020-10-13 MED ORDER — AMOXICILLIN-POT CLAVULANATE 875-125 MG PO TABS: 1.0000 | ORAL_TABLET | Freq: Two times a day (BID) | ORAL | 0 refills | Status: AC

## 2020-10-13 NOTE — Evaluation (Signed)
Physical Therapy Evaluation Patient Details Name: [name] MRN: [id number] DOB: 1955/07/25 Today's Date: 10/13/2020  History of Present Illness  65 year old male who presented to the hospital on 9/27 with shortness of breath. patient was found to have COPD exacerbation, acute respiratory failure with hypoxia. PMH: multiple falls, L shoulder fx, COPD, OSA, chronic hypoxic respiratory failure, CKDIII, insulin dependent diabetes, HTN, depression, chronic pain.  Clinical Impression  On eval, pt was Min A for mobility. He walked ~75 feet with use of hallway handrail vs no UE support. Very unsteady and at risk for falls when walking without UE support. O2 85% on 3L, 94% on 4L with activity. Wife was present during session. She expressed concerns about pt being weak and having falls at home. She would like him to go to ST rehab to get stronger. Pt is a bit resistant to the idea of SNF rehab. Recommend TOC consult. If pt chooses to return home, recommend HHPT and 24/7 supervision and assist.      Recommendations for follow up therapy are one component of a multi-disciplinary discharge planning process, led by the attending physician.  Recommendations may be updated based on patient status, additional functional criteria and insurance authorization.  Follow Up Recommendations SNF;Supervision/Assistance - 24 hour (HHPT if pt declines placement.)    Equipment Recommendations  None recommended by PT    Recommendations for Other Services       Precautions / Restrictions Precautions Precautions: Fall Precaution Comments: 3.5L O2 at home, monitor O2 Restrictions Weight Bearing Restrictions: No      Mobility  Bed Mobility Overal bed mobility: Modified Independent             General bed mobility comments: patient was out of bed with nursing this AM    Transfers Overall transfer level: Needs assistance Equipment used: None Transfers: Sit to/from Stand Sit to Stand: Min guard          General transfer comment: Min gaurd for safety  Ambulation/Gait Ambulation/Gait assistance: Min assist Gait Distance (Feet): 75 Feet Assistive device:  (hallway handrail) Gait Pattern/deviations: Step-through pattern;Decreased stride length     General Gait Details: Unsteady. Assist to stabilize throughout distance. Min guard with use of hallway handrail, Min A without UE assist. O2 85% on 3L, 94% on 4L.  Stairs            Wheelchair Mobility    Modified Rankin (Stroke Patients Only)       Balance Overall balance assessment: History of Falls;Needs assistance Sitting-balance support: Feet supported Sitting balance-Leahy Scale: Good       Standing balance-Leahy Scale: Fair                               Pertinent Vitals/Pain Pain Assessment: No/denies pain    Home Living Family/patient expects to be discharged to:: Private residence Living Arrangements: Spouse/significant other Available Help at Discharge: Family Type of Home: House Home Access: Stairs to enter Entrance Stairs-Rails: Can reach both Entrance Stairs-Number of Steps: 2 Home Layout: Multi-level;Bed/bath upstairs Home Equipment: Wheelchair - manual;Cane - single point;Shower seat;Bedside commode Additional Comments: on home O2 3 L    Prior Function Level of Independence: Independent         Comments: Pt is retired; He and wife have custody of 76 yo grandson. Has DME but he doesn't use them     Hand Dominance   Dominant Hand: Left    Extremity/Trunk  Assessment   Upper Extremity Assessment Upper Extremity Assessment: Defer to OT evaluation    Lower Extremity Assessment Lower Extremity Assessment: Generalized weakness    Cervical / Trunk Assessment Cervical / Trunk Assessment: Normal  Communication   Communication: HOH  Cognition Arousal/Alertness: Awake/alert Behavior During Therapy: WFL for tasks assessed/performed Overall Cognitive Status: Within Functional  Limits for tasks assessed                                        General Comments General comments (skin integrity, edema, etc.): patients O2 was noted to drop to 82% on 3L/min with functional activity tasks with 2.5 mins to deep breath back up while resting.    Exercises     Assessment/Plan    PT Assessment Patient needs continued PT services  PT Problem List Decreased strength;Decreased mobility;Decreased activity tolerance;Decreased balance       PT Treatment Interventions DME instruction;Gait training;Therapeutic exercise;Balance training;Functional mobility training;Therapeutic activities;Patient/family education    PT Goals (Current goals can be found in the Care Plan section)  Acute Rehab PT Goals Patient Stated Goal: to go home soon PT Goal Formulation: With patient Time For Goal Achievement: 10/27/20 Potential to Achieve Goals: Good    Frequency Min 3X/week   Barriers to discharge        Co-evaluation               AM-PAC PT "6 Clicks" Mobility  Outcome Measure Help needed turning from your back to your side while in a flat bed without using bedrails?: None Help needed moving from lying on your back to sitting on the side of a flat bed without using bedrails?: None Help needed moving to and from a bed to a chair (including a wheelchair)?: A Little Help needed standing up from a chair using your arms (e.g., wheelchair or bedside chair)?: A Little Help needed to walk in hospital room?: A Little Help needed climbing 3-5 steps with a railing? : A Little 6 Click Score: 20    End of Session Equipment Utilized During Treatment: Gait belt;Oxygen Activity Tolerance: Patient tolerated treatment well Patient left: in bed;with call bell/phone within reach;with family/visitor present   PT Visit Diagnosis: Unsteadiness on feet (R26.81);History of falling (Z91.81);Muscle weakness (generalized) (M62.81)    Time: 1000-1026 PT Time Calculation (min)  (ACUTE ONLY): 26 min   Charges:   PT Evaluation $PT Eval Moderate Complexity: 1 Mod PT Treatments $Gait Training: 8-22 mins          Doreatha Massed, PT Acute Rehabilitation  Office: [phone] Pager: [phone]

## 2020-10-13 NOTE — Plan of Care (Signed)
  Problem: Activity: Goal: Risk for activity intolerance will decrease Outcome: Progressing   Problem: Coping: Goal: Level of anxiety will decrease Outcome: Progressing   

## 2020-10-13 NOTE — Discharge Summary (Addendum)
Physician Discharge Summary  TOSHIO SLUSHER KGU:[id number] DOB: 04-Aug-1955 DOA: 10/11/2020  PCP: Leanna Battles, MD  Admit date: 10/11/2020 Discharge date: 10/14/2020  Admitted From: home Disposition:  home  Recommendations for Outpatient Follow-up:  Follow up with PCP in 1-2 weeks Follow up with Pulmonary as scheduled  Patient was seen and examined today, stable for dc, see dc summary below.  Home Health: none Equipment/Devices: none  Discharge Condition: stable CODE STATUS: Full code Diet recommendation: regular  HPI: Per admitting MD, [name] is a 65 y.o. male with medical history significant for asthma/severe COPD with chronic hypoxemia on 3.5 L during the day, 3 L at night, type 2 diabetes, CKD stage IIIb, PVD and hypertension who presents with concerns of increasing shortness of breath. Had on and off shortness of breath in the past week but acutely worsened yesterday.  Worse with exertion.  Has chronic cough but is not worse.  Denies any fever.  Patient continues to smoke about half a pack per day.  He denies any chest pain or tightness.  No new worsening lower extremity edema.    Denies any nausea, vomiting or diarrhea.Patient reports he mostly is sedentary. Patient reportedly found by EMS with oxygen saturation of 67% while on his home oxygen.  In the ED, he was placed on 10 L nonrebreather and eventually able to wean back down to his home 3.5 L after treatment with DuoNeb. WBC of 10.7, hemoglobin at baseline of 9.3, platelet of 127 which is chronic.  A sodium of 138, potassium 3.1, creatinine of 2.6 from 1.5, BG of 222 VBG with pH of 7.32 and CO2 of 48 Chest x-ray was negative.  COVID PCR/flu negative.  Patient has received all 4 doses of COVID-vaccine. Wife who is currently not at bedside endorsed confusion to ED provider.  CT head was negative.  Hospital Course / Discharge diagnoses: Principal problem Acute on chronic hypoxemic respiratory failure secondary to COPD  exacerbation -patient on 3.5 L in the morning, 3 L at night at baseline. He initially required 10 L on nonrebreather but able to wean back down to home oxygen after DuoNeb treatment. He improved with nebs, steroids, his wheezing has resolved and he feels back to baseline. He will be discharged home with a steroid taper and a short course of antibiotics  Active problems AKI on CKD stage IIIb -Creatinine of 2.6 from a prior 1.5. Improved, close to baseline.  Hypertension -continue home regimen Type 2 diabetes -continue home regimen    Chronic anemia -Hemoglobin stable at baseline of 9.3.  Likely anemia of chronic disease Thrombocytopenia -Plt 127 which is chronic  Sepsis ruled out   Discharge Instructions   Allergies as of 10/14/2020       Reactions   Codeine Nausea And Vomiting   Tetracyclines & Related Hives   Venlafaxine    Other reaction(s): sexual sxs        Medication List     TAKE these medications    albuterol (2.5 MG/3ML) 0.083% nebulizer solution Commonly known as: PROVENTIL Take 3 mLs (2.5 mg total) by nebulization every 6 (six) hours as needed for wheezing or shortness of breath.   amLODipine 5 MG tablet Commonly known as: NORVASC Take 1 tablet (5 mg total) by mouth daily.   amoxicillin-clavulanate 875-125 MG tablet Commonly known as: Augmentin Take 1 tablet by mouth every 12 (twelve) hours for 3 days.   aspirin EC 81 MG tablet Take 81 mg by mouth daily.  Basaglar KwikPen 100 UNIT/ML Inject 5 Units into the skin daily. What changed:  when to take this reasons to take this   cyanocobalamin 1000 MCG tablet Take 1 tablet (1,000 mcg total) by mouth daily.   divalproex 250 MG DR tablet Commonly known as: DEPAKOTE Take 250 mg by mouth daily.   doxepin 10 MG capsule Commonly known as: SINEQUAN Take 10 mg by mouth at bedtime.   gabapentin 300 MG capsule Commonly known as: NEURONTIN Take 300 mg by mouth 2 (two) times daily.   losartan 50 MG  tablet Commonly known as: COZAAR Take 1 tablet (50 mg total) by mouth daily.   metoprolol tartrate 25 MG tablet Commonly known as: LOPRESSOR Take 0.5 tablets (12.5 mg total) by mouth 2 (two) times daily. Take 2 tablets (50 mg) by mouth every morning and 1 tablet (25 mg) at bedtime What changed:  how much to take additional instructions   MS Contin 15 MG 12 hr tablet Generic drug: morphine Take 15 mg by mouth every 8 (eight) hours.   OXYGEN Inhale 3.5 L into the lungs continuous.   predniSONE 10 MG tablet Commonly known as: DELTASONE Take 4 tablets (40 mg total) by mouth daily. 4 tablets daily x 3 days then 3 daily x 3 days then 2 daily x 3 days then 1 daily x 3 days   PREVAGEN PO Take 1 capsule by mouth daily.   sertraline 50 MG tablet Commonly known as: ZOLOFT Take 50 mg by mouth daily.   simvastatin 20 MG tablet Commonly known as: ZOCOR Take 20 mg by mouth daily.   Trelegy Ellipta 100-62.5-25 MCG/INH Aepb Generic drug: Fluticasone-Umeclidin-Vilant Inhale 1 puff into the lungs daily.          Consultations: none  Procedures/Studies:  CT HEAD WO CONTRAST (5MM)  Result Date: 10/11/2020 CLINICAL DATA:  Weakness and confusion EXAM: CT HEAD WITHOUT CONTRAST TECHNIQUE: Contiguous axial images were obtained from the base of the skull through the vertex without intravenous contrast. COMPARISON:  03/10/2020 FINDINGS: Streak artifact from cochlear implant device on the LEFT. Brain: No acute intracranial hemorrhage. No focal mass lesion. No CT evidence of acute infarction. No midline shift or mass effect. No hydrocephalus. Basilar cisterns are patent. Vascular: No hyperdense vessel or unexpected calcification. Skull: Normal. Negative for fracture or focal lesion. Sinuses/Orbits: Paranasal sinusesare clear. Orbits are clear. Small fluid in the inferior LEFT mastoid air cells unchanged from prior. Postsurgical change in the mastoid related to cochlear implant Other: None.  IMPRESSION: 1. No acute intracranial findings. Electronically Signed   By: Suzy Bouchard M.D.   On: 10/11/2020 19:05   DG Chest Portable 1 View  Result Date: 10/11/2020 CLINICAL DATA:  Fatigue, weakness, confusion, short of breath EXAM: PORTABLE CHEST 1 VIEW COMPARISON:  03/10/2020, 06/11/2020 FINDINGS: Single frontal view of the chest demonstrates a stable cardiac silhouette. Background emphysema again noted. No acute airspace disease, effusion, or pneumothorax. There are no acute displaced fractures. IMPRESSION: 1. Emphysema, no acute airspace disease. 2. Please refer to previous chest CT report 06/11/2020 recommending three-month CT follow-up for right lower lobe nodularity. Electronically Signed   By: Randa Ngo M.D.   On: 10/11/2020 19:19     Subjective: - no chest pain, shortness of breath, no abdominal pain, nausea or vomiting.   Discharge Exam: BP (!) 165/62   Pulse 91   Temp 97.6 F (36.4 C) (Oral)   Resp 17   Ht 5' 9.5" (1.765 m)   Wt 61.4 kg  SpO2 91%   BMI 19.70 kg/m   General: Pt is alert, awake, not in acute distress Cardiovascular: RRR, S1/S2 +, no rubs, no gallops Respiratory: CTA bilaterally, no wheezing, no rhonchi Abdominal: Soft, NT, ND, bowel sounds + Extremities: no edema, no cyanosis    The results of significant diagnostics from this hospitalization (including imaging, microbiology, ancillary and laboratory) are listed below for reference.     Microbiology: Recent Results (from the past 240 hour(s))  Resp Panel by RT-PCR (Flu A&B, Covid) Nasopharyngeal Swab     Status: None   Collection Time: 10/11/20  6:12 PM   Specimen: Nasopharyngeal Swab; Nasopharyngeal(NP) swabs in vial transport medium  Result Value Ref Range Status   SARS Coronavirus 2 by RT PCR NEGATIVE NEGATIVE Final    Comment: (NOTE) SARS-CoV-2 target nucleic acids are NOT DETECTED.  The SARS-CoV-2 RNA is generally detectable in upper respiratory specimens during the acute phase  of infection. The lowest concentration of SARS-CoV-2 viral copies this assay can detect is 138 copies/mL. A negative result does not preclude SARS-Cov-2 infection and should not be used as the sole basis for treatment or other patient management decisions. A negative result may occur with  improper specimen collection/handling, submission of specimen other than nasopharyngeal swab, presence of viral mutation(s) within the areas targeted by this assay, and inadequate number of viral copies(<138 copies/mL). A negative result must be combined with clinical observations, patient history, and epidemiological information. The expected result is Negative.  Fact Sheet for Patients:  EntrepreneurPulse.com.au  Fact Sheet for Healthcare Providers:  IncredibleEmployment.be  This test is no t yet approved or cleared by the [place] FDA and  has been authorized for detection and/or diagnosis of SARS-CoV-2 by FDA under an Emergency Use Authorization (EUA). This EUA will remain  in effect (meaning this test can be used) for the duration of the COVID-19 declaration under Section 564(b)(1) of the Act, 21 U.S.C.section 360bbb-3(b)(1), unless the authorization is terminated  or revoked sooner.       Influenza A by PCR NEGATIVE NEGATIVE Final   Influenza B by PCR NEGATIVE NEGATIVE Final    Comment: (NOTE) The Xpert Xpress SARS-CoV-2/FLU/RSV plus assay is intended as an aid in the diagnosis of influenza from Nasopharyngeal swab specimens and should not be used as a sole basis for treatment. Nasal washings and aspirates are unacceptable for Xpert Xpress SARS-CoV-2/FLU/RSV testing.  Fact Sheet for Patients: EntrepreneurPulse.com.au  Fact Sheet for Healthcare Providers: IncredibleEmployment.be  This test is not yet approved or cleared by the [place] FDA and has been authorized for detection and/or diagnosis of  SARS-CoV-2 by FDA under an Emergency Use Authorization (EUA). This EUA will remain in effect (meaning this test can be used) for the duration of the COVID-19 declaration under Section 564(b)(1) of the Act, 21 U.S.C. section 360bbb-3(b)(1), unless the authorization is terminated or revoked.  Performed at [facility], [place] [address] [place],  [address]      Labs: Basic Metabolic Panel: Recent Labs  Lab 10/11/20 1812 10/12/20 0251 10/13/20 0404  NA 138 142 139  K 3.8 3.7 4.3  CL 101 105 102  CO2 27 31 28   GLUCOSE 222* 166* 191*  BUN 40* 41* 41*  CREATININE 2.60* 2.27* 1.72*  CALCIUM 7.7* 7.7* 8.2*   Liver Function Tests: Recent Labs  Lab 10/11/20 1812  AST 24  ALT 18  ALKPHOS 53  BILITOT 0.3  PROT 4.8*  ALBUMIN 2.3*   CBC: Recent Labs  Lab  10/11/20 1812 10/13/20 0404  WBC 10.7* 5.7  NEUTROABS 9.0* 4.8  HGB 9.3* 9.2*  HCT 28.6* 28.0*  MCV 90.5 87.8  PLT 127* 140*   CBG: Recent Labs  Lab 10/13/20 1151 10/13/20 1643 10/13/20 2103 10/14/20 0739 10/14/20 1136  GLUCAP 163* 268* 327* 207* 198*   Hgb A1c Recent Labs    10/12/20 0251  HGBA1C 7.4*   Lipid Profile No results for input(s): CHOL, HDL, LDLCALC, TRIG, CHOLHDL, LDLDIRECT in the last 72 hours. Thyroid function studies No results for input(s): TSH, T4TOTAL, T3FREE, THYROIDAB in the last 72 hours.  Invalid input(s): FREET3 Urinalysis    Component Value Date/Time   COLORURINE YELLOW 10/11/2020 2237   APPEARANCEUR CLEAR 10/11/2020 2237   LABSPEC >1.030 (H) 10/11/2020 2237   PHURINE 5.5 10/11/2020 2237   GLUCOSEU NEGATIVE 10/11/2020 2237   HGBUR TRACE (A) 10/11/2020 2237   [place] NEGATIVE 10/11/2020 2237   KETONESUR NEGATIVE 10/11/2020 2237   PROTEINUR 100 (A) 10/11/2020 2237   NITRITE NEGATIVE 10/11/2020 2237   LEUKOCYTESUR NEGATIVE 10/11/2020 2237    FURTHER DISCHARGE INSTRUCTIONS:   Get Medicines reviewed and adjusted: Please take all your  medications with you for your next visit with your Primary MD   Laboratory/radiological data: Please request your Primary MD to go over all hospital tests and procedure/radiological results at the follow up, please ask your Primary MD to get all Hospital records sent to his/her office.   In some cases, they will be blood work, cultures and biopsy results pending at the time of your discharge. Please request that your primary care M.D. goes through all the records of your hospital data and follows up on these results.   Also Note the following: If you experience worsening of your admission symptoms, develop shortness of breath, life threatening emergency, suicidal or homicidal thoughts you must seek medical attention immediately by calling 911 or calling your MD immediately  if symptoms less severe.   You must read complete instructions/literature along with all the possible adverse reactions/side effects for all the Medicines you take and that have been prescribed to you. Take any new Medicines after you have completely understood and accpet all the possible adverse reactions/side effects.    Do not drive when taking Pain medications or sleeping medications (Benzodaizepines)   Do not take more than prescribed Pain, Sleep and Anxiety Medications. It is not advisable to combine anxiety,sleep and pain medications without talking with your primary care practitioner   Special Instructions: If you have smoked or chewed Tobacco  in the last 2 yrs please stop smoking, stop any regular Alcohol  and or any Recreational drug use.   Wear Seat belts while driving.   Please note: You were cared for by a hospitalist during your hospital stay. Once you are discharged, your primary care physician will handle any further medical issues. Please note that NO REFILLS for any discharge medications will be authorized once you are discharged, as it is imperative that you return to your primary care physician (or  establish a relationship with a primary care physician if you do not have one) for your post hospital discharge needs so that they can reassess your need for medications and monitor your lab values.  Time coordinating discharge: 40 minutes  SIGNED:  Marzetta Board, MD, PhD 10/14/2020, 12:35 PM

## 2020-10-13 NOTE — TOC Initial Note (Addendum)
Transition of Care [facility]) - Initial/Assessment Note    Patient Details  Name: [name] MRN: [id number] Date of Birth: Nov 24, 1955  Transition of Care [facility]) CM/SW Contact:    Ross Ludwig, LCSW Phone Number: 10/13/2020, 5:20 PM  Clinical Narrative:                  Patient is a 65 year old male who is alert and oriented x4.  Per physician and PT, patient needs short term rehab before he is able to return back home.  Patient has not been to SNF in the past for short term rehab.  Patient's wife is familiar with what to expect though, she has had other family members at SNFs.  Patient's wife gave CSW permission to begin bed search in [place] awaiting bed offers.  Patient's wife stated they know the director of [place], and would prefer him to go there if possible.  CSW informed her it will depend on bed availability if he can go there.  CSW to follow patient's progress throughout discharge planning.  Expected Discharge Plan: Skilled Nursing Facility Barriers to Discharge: Continued Medical Work up   Patient Goals and CMS Choice Patient states their goals for this hospitalization and ongoing recovery are:: To go to SNF for short term rehab, then return back home. CMS Medicare.gov Compare Post Acute Care list provided to:: Patient Represenative (must comment) Choice offered to / list presented to : Spouse  Expected Discharge Plan and Services Expected Discharge Plan: [place] arrangements for the past 2 months: Single Family Home Expected Discharge Date: 10/13/20                                    Prior Living Arrangements/Services Living arrangements for the past 2 months: Single Family Home Lives with:: Spouse Patient language and need for interpreter reviewed:: Yes Do you feel safe going back to the place where you live?: No   Patient feels he needs some rehab, before returning back home.  Need for Family Participation in  Patient Care: No (Comment) Care giver support system in place?: No (comment)   Criminal Activity/Legal Involvement Pertinent to Current Situation/Hospitalization: No - Comment as needed  Activities of Daily Living Home Assistive Devices/Equipment: CBG Meter, CPAP, Cane (specify quad or straight), Eyeglasses, Walker (specify type), Other (Comment), Hearing aid (left hearing implant, front wheeled walker, single point cane, walk-in shower, handicap height toilet) ADL Screening (condition at time of admission) Patient's cognitive ability adequate to safely complete daily activities?: No (secondary to confusion) Is the patient deaf or have difficulty hearing?: Yes (has left hearing implant) Does the patient have difficulty seeing, even when wearing glasses/contacts?: Yes Does the patient have difficulty concentrating, remembering, or making decisions?: Yes Patient able to express need for assistance with ADLs?: Yes Does the patient have difficulty dressing or bathing?: Yes Independently performs ADLs?: No (worsening weakness and confusion) Communication: Independent Dressing (OT): Needs assistance Is this a change from baseline?: Change from baseline, expected to last >3 days Grooming: Needs assistance Is this a change from baseline?: Change from baseline, expected to last >3 days Feeding: Needs assistance Is this a change from baseline?: Change from baseline, expected to last >3 days Bathing: Needs assistance Is this a change from baseline?: Change from baseline, expected to last >3 days Toileting: Needs assistance Is this a change from baseline?: Change  from baseline, expected to last >3days In/Out Bed: Needs assistance Is this a change from baseline?: Change from baseline, expected to last >3 days Walks in Home: Needs assistance Is this a change from baseline?: Change from baseline, expected to last >3 days Does the patient have difficulty walking or climbing stairs?: Yes (secondary to  worsening weakness and confusion) Weakness of Legs: Both Weakness of Arms/Hands: None  Permission Sought/Granted Permission sought to share information with : Facility Sport and exercise psychologist, Family Supports Permission granted to share information with : Yes, Release of Information Signed  Share Information with NAME: Gay, Rape [phone]  [phone]  Permission granted to share info w AGENCY: SNF admissions        Emotional Assessment Appearance:: Appears stated age   Affect (typically observed): Accepting, Calm Orientation: : Oriented to Self, Oriented to Place, Oriented to  Time, Oriented to Situation Alcohol / Substance Use: Not Applicable Psych Involvement: No (comment)  Admission diagnosis:  Hypoxia [R09.02] COPD exacerbation ([place]) [J44.1] Failure to thrive in adult [R62.7] Acute respiratory failure with hypoxia ([place]) [J96.01] AKI (acute kidney injury) ([place]) [N17.9] Patient Active Problem List   Diagnosis Date Noted   Acute respiratory failure with hypoxia ([place]) 10/12/2020   Type 2 DM with CKD and hypertension ([place]) 10/12/2020   COPD with acute exacerbation ([place]) 10/12/2020   Rib fractures 03/10/2020   Closed fracture of proximal end of left humerus 03/10/2020   Normocytic anemia 03/10/2020   Thrombocytopenia ([place]) 03/10/2020   COPD, severe ([place]) 12/17/2019   Current smoker 12/17/2019   Acute encephalopathy 03/31/2019   AMS (altered mental status) 03/30/2019   Cellulitis of lower extremity 06/01/2018   Cellulitis of both lower extremities 05/31/2018   Chronic respiratory failure with hypoxia ([place]) 05/31/2018   HLD (hyperlipidemia) 05/31/2018   Acute renal failure superimposed on stage 3b chronic kidney disease ([place]) 05/31/2018   Depression with anxiety 05/31/2018   Chronic pain 05/31/2018   Acute respiratory failure with hypoxia and hypercapnia (HCC) 04/14/2018   Diabetes mellitus type 1 with manifestations ([place]) 04/14/2018   Postop check 10/31/2017    Central sleep apnea 10/14/2017   Insomnia secondary to chronic pain 08/01/2017   Contact with and (suspected) exposure to environmental tobacco smoke (acute) (chronic) 08/01/2017   Snoring 08/01/2017   Venous insufficiency (chronic) (peripheral) 10/04/2016   Essential hypertension 08/01/2013   Chronic ulcer of left ankle ([place]) 08/01/2013   Type I (juvenile type) diabetes mellitus with neurological manifestations, not stated as uncontrolled(250.61) 10/03/2012   Healthcare-associated pneumonia 10/03/2012   Dyslipidemia 10/03/2012   Insomnia 10/03/2012   Pain in joint, lower leg 10/02/2012   Special screening for malignant neoplasms, colon 07/26/2010   PCP:  Leanna Battles, MD Pharmacy:   [id number] - [place], [place] [address] Phone: [phone] Fax: [phone]     Social Determinants of Health (SDOH) Interventions    Readmission Risk Interventions No flowsheet data found.

## 2020-10-13 NOTE — Progress Notes (Signed)
Inpatient Diabetes Program Recommendations  AACE/ADA: New Consensus Statement on Inpatient Glycemic Control  Target Ranges:  Prepandial:   less than 140 mg/dL      Peak postprandial:   less than 180 mg/dL (1-2 hours)      Critically ill patients:  140 - 180 mg/dL  Results for [name], [name] (MRN [id number]) as of 10/13/2020 07:38  Ref. Range 10/13/2020 04:04  Glucose Latest Ref Range: 70 - 99 mg/dL 191 (H)   Results for [name], [name] (MRN [id number]) as of 10/13/2020 07:38  Ref. Range 10/12/2020 02:50 10/12/2020 08:14 10/12/2020 12:11 10/12/2020 17:02 10/12/2020 20:15  Glucose-Capillary Latest Ref Range: 70 - 99 mg/dL 160 (H) 187 (H) 251 (H) 171 (H) 205 (H)    Review of Glycemic Control  Diabetes history: DM2 Outpatient Diabetes medications: Basaglar 5 units as needed Current orders for Inpatient glycemic control: Novolog 0-9 units TID with meals; Solumedrol 40 mg daily  Inpatient Diabetes Program Recommendations:    Insulin: Please consider adding Novolog 0-5 units QHS for bedtime correction.  If steroids are continued, please consider ordering Novolog 3 units TID with meals for meal coverage if patient eats at least 50% of meals.  Thanks, Barnie Alderman, RN, MSN, CDE Diabetes Coordinator Inpatient Diabetes Program [phone] (Team Pager from 8am to 5pm)

## 2020-10-13 NOTE — Consult Note (Signed)
WOC Nurse Consult Note: Patient receiving care in [place]. Patient is able to turn and reposition self without difficulty. 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  The overlying tissue is intact at this time.  It may not be for much longer, however. Wound bed: dark purple Drainage (amount, consistency, odor) none Periwound: intact Dressing procedure/placement/frequency: I had a lengthy conversation with the patient and learned he has been sitting for prolonged periods of time prior to this hospitalization. Also, he sleeps only on his back.  We discussed at length what is going to happen with the progression of this wound if he does not immediately stop sitting/lying directly on the sacral-coccyx area.  He verbalized his understanding.  I have ordered a pressure redistribution chair pad, and have placed turning instructions that includes avoiding pressure to the affected area to the greatest extent possible.  There was a sacral foam dressing in place at the time of my assessment.  This approach can provide some assistance for the area, but it will not solve the problem of eliminating pressure to the site.  I explained this to the patient.  Monitor the wound area(s) for worsening of condition such as: Signs/symptoms of infection,  Increase in size,  Development of or worsening of odor, Development of pain, or increased pain at the affected locations.  Notify the medical team if any of these develop.  Thank you for the consult.  Discussed plan of care with the patient.  [place] nurse will not follow at this time.  Please re-consult the [place] team if needed.  Val Riles, RN, MSN, CWOCN, CNS-BC, pager [phone]

## 2020-10-13 NOTE — NC FL2 (Signed)
[place] LEVEL OF CARE SCREENING TOOL     IDENTIFICATION  Patient Name: [name] Birthdate: 1955-05-29 Sex: male Admission Date (Current Location): 10/11/2020  [facility] [place] Number:  Herbalist and Address:  [facility],  [place], [place]      Provider Number: [id number]  Attending Physician Name and Address:  Caren Griffins, MD  Relative Name and Phone Number:  Parslow,Janice Spouse [phone]  [phone]    Current Level of Care: Hospital Recommended Level of Care: [place] Prior Approval Number:    Date Approved/Denied:   PASRR Number: [id number]  Discharge Plan: SNF    Current Diagnoses: Patient Active Problem List   Diagnosis Date Noted   Acute respiratory failure with hypoxia ([place]) 10/12/2020   Type 2 DM with CKD and hypertension ([place]) 10/12/2020   COPD with acute exacerbation ([place]) 10/12/2020   Rib fractures 03/10/2020   Closed fracture of proximal end of left humerus 03/10/2020   Normocytic anemia 03/10/2020   Thrombocytopenia ([place]) 03/10/2020   COPD, severe ([place]) 12/17/2019   Current smoker 12/17/2019   Acute encephalopathy 03/31/2019   AMS (altered mental status) 03/30/2019   Cellulitis of lower extremity 06/01/2018   Cellulitis of both lower extremities 05/31/2018   Chronic respiratory failure with hypoxia ([place]) 05/31/2018   HLD (hyperlipidemia) 05/31/2018   Acute renal failure superimposed on stage 3b chronic kidney disease ([place]) 05/31/2018   Depression with anxiety 05/31/2018   Chronic pain 05/31/2018   Acute respiratory failure with hypoxia and hypercapnia ([place]) 04/14/2018   Diabetes mellitus type 1 with manifestations ([place]) 04/14/2018   Postop check 10/31/2017   Central sleep apnea 10/14/2017   Insomnia secondary to chronic pain 08/01/2017   Contact with and (suspected) exposure to environmental tobacco smoke (acute) (chronic) 08/01/2017   Snoring 08/01/2017    Venous insufficiency (chronic) (peripheral) 10/04/2016   Essential hypertension 08/01/2013   Chronic ulcer of left ankle ([place]) 08/01/2013   Type I (juvenile type) diabetes mellitus with neurological manifestations, not stated as uncontrolled(250.61) 10/03/2012   Healthcare-associated pneumonia 10/03/2012   Dyslipidemia 10/03/2012   Insomnia 10/03/2012   Pain in joint, lower leg 10/02/2012   Special screening for malignant neoplasms, colon 07/26/2010    Orientation RESPIRATION BLADDER Height & Weight     Self, Time, Situation, Place  O2 (3L) Continent Weight: 135 lb 5.8 oz (61.4 kg) Height:  5' 9.5" (176.5 cm)  BEHAVIORAL SYMPTOMS/MOOD NEUROLOGICAL BOWEL NUTRITION STATUS      Continent Diet (Regular)  AMBULATORY STATUS COMMUNICATION OF NEEDS Skin   Limited Assist Verbally PU Stage and Appropriate Care (Every 3 days dressing changes)                       Personal Care Assistance Level of Assistance  Bathing, Feeding Bathing Assistance: Limited assistance Feeding assistance: Independent       Functional Limitations Info  Sight, Hearing, Speech Sight Info: Adequate Hearing Info: Adequate Speech Info: Adequate    SPECIAL CARE FACTORS FREQUENCY  PT (By licensed PT), OT (By licensed OT)     PT Frequency: Minimum 5x a week OT Frequency: Minimum 5x a week            Contractures Contractures Info: Not present    Additional Factors Info  Code Status, Allergies, Psychotropic, Insulin Sliding Scale Code Status Info: Full Code Allergies Info: Codeine   Tetracyclines & Related   Venlafaxine Psychotropic Info: divalproex (DEPAKOTE) DR  tablet 250 mg, sertraline (ZOLOFT) tablet 50 mg Insulin Sliding Scale Info: insulin aspart (novoLOG) injection 0-9 Units 3x a day with meals.       Current Medications (10/13/2020):  This is the current hospital active medication list Current Facility-Administered Medications  Medication Dose Route Frequency Provider Last Rate Last  Admin   aspirin EC tablet 81 mg  81 mg Oral Daily Tu, Ching T, DO   81 mg at 10/13/20 1034   divalproex (DEPAKOTE) DR tablet 250 mg  250 mg Oral Daily Hosie Poisson, MD   250 mg at 10/13/20 1034   doxepin (SINEQUAN) capsule 10 mg  10 mg Oral QHS Tu, Ching T, DO   10 mg at 10/12/20 2110   fluticasone furoate-vilanterol (BREO ELLIPTA) 100-25 MCG/INH 1 puff  1 puff Inhalation Daily Tu, Ching T, DO   1 puff at 10/13/20 0855   And   umeclidinium bromide (INCRUSE ELLIPTA) 62.5 MCG/INH 1 puff  1 puff Inhalation Daily Tu, Ching T, DO   1 puff at 10/13/20 0855   gabapentin (NEURONTIN) capsule 300 mg  300 mg Oral BID Hosie Poisson, MD   300 mg at 10/13/20 1034   heparin injection 5,000 Units  5,000 Units Subcutaneous Q8H Tu, Ching T, DO   5,000 Units at 10/13/20 1529   insulin aspart (novoLOG) injection 0-9 Units  0-9 Units Subcutaneous TID WC Tu, Ching T, DO   5 Units at 10/13/20 1713   ipratropium-albuterol (DUONEB) 0.5-2.5 (3) MG/3ML nebulizer solution 3 mL  3 mL Nebulization Q6H PRN Tu, Ching T, DO   3 mL at 10/12/20 0451   MEDLINE mouth rinse  15 mL Mouth Rinse BID Hosie Poisson, MD   15 mL at 10/13/20 1046   methylPREDNISolone sodium succinate (SOLU-MEDROL) 40 mg/mL injection 40 mg  40 mg Intravenous Daily Tu, Ching T, DO   40 mg at 10/13/20 1034   morphine (MS CONTIN) 12 hr tablet 15 mg  15 mg Oral Q8H Hosie Poisson, MD   15 mg at 10/13/20 1529   sertraline (ZOLOFT) tablet 50 mg  50 mg Oral Daily Tu, Ching T, DO   50 mg at 10/12/20 1232   simvastatin (ZOCOR) tablet 20 mg  20 mg Oral Daily Tu, Ching T, DO   20 mg at 10/13/20 1034     Discharge Medications: Please see discharge summary for a list of discharge medications.  Relevant Imaging Results:  Relevant Lab Results:   Additional Information SSN [id number]  Ross Ludwig, LCSW

## 2020-10-13 NOTE — Evaluation (Signed)
Occupational Therapy Evaluation Patient Details Name: [name] MRN: [id number] DOB: 03/08/1955 Today's Date: 10/13/2020   History of Present Illness Patient is a 65 year old male who presented to the hospital on 9/27 with shortness of breath. patient was found to have COPD exacerbation, acute respiratory failure with hypoxia. PMH: COPD, OSA, chronic hypoxic respiratory failure, CKDIII, insulin dependent diabetes, HTN, depression, chronic pain.   Clinical Impression   Patient is a 65 year old male who was admitted for above. Patient was living at home with wife with independent in bathing, toileting and dressing tasks. Currently, patient is unable to maintain O2 saturation on home O2 levels to engage in toileting tasks. Patient requires increased assistance for standing balance, with noted poor activity tolerance.patient also reports needing to navigate 15 steps to get to his living area at home.  Patient would continue to benefit from skilled OT services at this time while admitted and after d/c to address noted deficits in order to improve overall safety and independence in ADLs.        Recommendations for follow up therapy are one component of a multi-disciplinary discharge planning process, led by the attending physician.  Recommendations may be updated based on patient status, additional functional criteria and insurance authorization.   Follow Up Recommendations  SNF v.s. Home health OT;Supervision/Assistance - 24 hour    Equipment Recommendations  None recommended by OT    Recommendations for Other Services       Precautions / Restrictions Precautions Precautions: Fall Precaution Comments: 3.5L O2 at home, monitor O2 Restrictions Weight Bearing Restrictions: No      Mobility Bed Mobility               General bed mobility comments: patient was out of bed with nursing this AM    Transfers Overall transfer level: Needs assistance Equipment used: None Transfers:  Sit to/from Stand Sit to Stand: Min guard         General transfer comment: patient was noted to have multiple moments of unsteady walking with attempts to reach out to furniture in room but declining to use RW.    Balance Overall balance assessment: Needs assistance Sitting-balance support: Feet supported Sitting balance-Leahy Scale: Good       Standing balance-Leahy Scale: Fair                             ADL either performed or assessed with clinical judgement   ADL Overall ADL's : Needs assistance/impaired Eating/Feeding: Set up;Sitting   Grooming: Oral care;Wash/dry face;Set up;Sitting   Upper Body Bathing: Set up;Sitting   Lower Body Bathing: Minimal assistance;Sit to/from stand   Upper Body Dressing : Minimal assistance;Sitting   Lower Body Dressing: Minimal assistance;Sit to/from stand Lower Body Dressing Details (indicate cue type and reason): patient was noted to have balance deficits in standing with LOB while attempting to manage clothing. patient was able to don bilateral sneakers sitting in chair with increased time and noted drop of O2 saturation after bending. Toilet Transfer: Min guard;Ambulation;Regular Toilet;Grab bars Toilet Transfer Details (indicate cue type and reason): patient was noted to have one LOB standing to void urine backwards with use of grab bar and min A to maintain standing balance. Toileting- Clothing Manipulation and Hygiene: Minimal assistance;Sit to/from stand Toileting - Clothing Manipulation Details (indicate cue type and reason): patients O2 was noted to drop to 82% on 3L/min with patient bumped up to 4L/min. patient needed  2.5 seated rest break to recover O2 saturation after toileting tasks.nurse made aware     Functional mobility during ADLs: Minimal assistance;Cueing for safety General ADL Comments: patient appeared to have difficult time understanding level of deficits at this time and patient was noted to be  unrealistic on ablity to complete 15 steps to living environment and maintain O2 saturation. patient reported having a chair lift but that it was not installed at this time.     Vision Baseline Vision/History: 1 Wears glasses Ability to See in Adequate Light: 1 Impaired Patient Visual Report: No change from baseline       Perception     Praxis      Pertinent Vitals/Pain Pain Assessment: No/denies pain     Hand Dominance Left   Extremity/Trunk Assessment Upper Extremity Assessment Upper Extremity Assessment: Overall WFL for tasks assessed   Lower Extremity Assessment Lower Extremity Assessment: Defer to PT evaluation   Cervical / Trunk Assessment Cervical / Trunk Assessment: Normal   Communication Communication Communication: HOH   Cognition Arousal/Alertness: Awake/alert Behavior During Therapy: WFL for tasks assessed/performed Overall Cognitive Status: Within Functional Limits for tasks assessed                                     General Comments  patients O2 was noted to drop to 82% on 3L/min with functional activity tasks with 2.5 mins to deep breath back up while resting.    Exercises     Shoulder Instructions      Home Living Family/patient expects to be discharged to:: Private residence Living Arrangements: Spouse/significant other Available Help at Discharge: Family Type of Home: House Home Access: Stairs to enter Technical brewer of Steps: 2 Entrance Stairs-Rails: Can reach both Home Layout: Multi-level;Bed/bath upstairs Alternate Level Stairs-Number of Steps: flight Alternate Level Stairs-Rails: Right Bathroom Shower/Tub: Occupational psychologist: Handicapped height     Home Equipment: Wheelchair - manual;Cane - single point;Shower seat;Bedside commode   Additional Comments: on home O2 3 L      Prior Functioning/Environment Level of Independence: Independent with assistive device(s)        Comments: Pt is  retired; He and wife have custody of 66 yo grandson        OT Problem List: Decreased knowledge of use of DME or AE;Decreased activity tolerance;Impaired balance (sitting and/or standing);Decreased safety awareness      OT Treatment/Interventions: Self-care/ADL training;Therapeutic exercise;DME and/or AE instruction;Therapeutic activities;Energy conservation;Balance training;Patient/family education    OT Goals(Current goals can be found in the care plan section) Acute Rehab OT Goals Patient Stated Goal: to go home soon OT Goal Formulation: With patient Time For Goal Achievement: 10/27/20 Potential to Achieve Goals: Good  OT Frequency: Min 2X/week   Barriers to D/C:    patients wife has 34 year old to care for at home.       Co-evaluation              AM-PAC OT "6 Clicks" Daily Activity     Outcome Measure Help from another person eating meals?: None Help from another person taking care of personal grooming?: A Little Help from another person toileting, which includes using toliet, bedpan, or urinal?: A Little Help from another person bathing (including washing, rinsing, drying)?: A Lot Help from another person to put on and taking off regular upper body clothing?: A Little Help from another person to put on  and taking off regular lower body clothing?: A Little 6 Click Score: 18   End of Session Equipment Utilized During Treatment: Gait belt Nurse Communication: Mobility status;Other (comment) (concerns over transitioning home)  Activity Tolerance: Patient tolerated treatment well Patient left: in chair;with call bell/phone within reach  OT Visit Diagnosis: Unsteadiness on feet (R26.81);History of falling (Z91.81)                Time: 2542-7062 OT Time Calculation (min): 33 min Charges:  OT General Charges $OT Visit: 1 Visit OT Evaluation $OT Eval Low Complexity: 1 Low OT Treatments $Self Care/Home Management : 8-22 mins  Jackelyn Poling OTR/L, MS Acute  Rehabilitation Department Office# [phone] Pager# [phone]   [place] 10/13/2020, 10:07 AM

## 2020-10-13 NOTE — Progress Notes (Signed)
Patient provided with pressure redistribution chair pad and educated on its use.  Patient expressed understanding.  Need to turn reinforced.

## 2020-10-14 DIAGNOSIS — Z978 Presence of other specified devices: Secondary | ICD-10-CM | POA: Diagnosis not present

## 2020-10-14 DIAGNOSIS — Z452 Encounter for adjustment and management of vascular access device: Secondary | ICD-10-CM | POA: Diagnosis not present

## 2020-10-14 DIAGNOSIS — N1832 Chronic kidney disease, stage 3b: Secondary | ICD-10-CM | POA: Diagnosis present

## 2020-10-14 DIAGNOSIS — R6521 Severe sepsis with septic shock: Secondary | ICD-10-CM | POA: Diagnosis not present

## 2020-10-14 DIAGNOSIS — H903 Sensorineural hearing loss, bilateral: Secondary | ICD-10-CM | POA: Diagnosis present

## 2020-10-14 DIAGNOSIS — R4020 Unspecified coma: Secondary | ICD-10-CM | POA: Diagnosis not present

## 2020-10-14 DIAGNOSIS — A419 Sepsis, unspecified organism: Secondary | ICD-10-CM | POA: Diagnosis present

## 2020-10-14 DIAGNOSIS — G9341 Metabolic encephalopathy: Secondary | ICD-10-CM | POA: Diagnosis not present

## 2020-10-14 DIAGNOSIS — J9611 Chronic respiratory failure with hypoxia: Secondary | ICD-10-CM | POA: Diagnosis not present

## 2020-10-14 DIAGNOSIS — J449 Chronic obstructive pulmonary disease, unspecified: Secondary | ICD-10-CM | POA: Diagnosis not present

## 2020-10-14 DIAGNOSIS — R498 Other voice and resonance disorders: Secondary | ICD-10-CM | POA: Diagnosis not present

## 2020-10-14 DIAGNOSIS — Z66 Do not resuscitate: Secondary | ICD-10-CM | POA: Diagnosis not present

## 2020-10-14 DIAGNOSIS — D638 Anemia in other chronic diseases classified elsewhere: Secondary | ICD-10-CM | POA: Diagnosis not present

## 2020-10-14 DIAGNOSIS — Z515 Encounter for palliative care: Secondary | ICD-10-CM | POA: Diagnosis not present

## 2020-10-14 DIAGNOSIS — Z9189 Other specified personal risk factors, not elsewhere classified: Secondary | ICD-10-CM | POA: Diagnosis not present

## 2020-10-14 DIAGNOSIS — J80 Acute respiratory distress syndrome: Secondary | ICD-10-CM | POA: Diagnosis not present

## 2020-10-14 DIAGNOSIS — I10 Essential (primary) hypertension: Secondary | ICD-10-CM | POA: Diagnosis not present

## 2020-10-14 DIAGNOSIS — E1065 Type 1 diabetes mellitus with hyperglycemia: Secondary | ICD-10-CM | POA: Diagnosis not present

## 2020-10-14 DIAGNOSIS — J440 Chronic obstructive pulmonary disease with acute lower respiratory infection: Secondary | ICD-10-CM | POA: Diagnosis present

## 2020-10-14 DIAGNOSIS — E1159 Type 2 diabetes mellitus with other circulatory complications: Secondary | ICD-10-CM | POA: Diagnosis not present

## 2020-10-14 DIAGNOSIS — Z4682 Encounter for fitting and adjustment of non-vascular catheter: Secondary | ICD-10-CM | POA: Diagnosis not present

## 2020-10-14 DIAGNOSIS — R4182 Altered mental status, unspecified: Secondary | ICD-10-CM | POA: Diagnosis not present

## 2020-10-14 DIAGNOSIS — R911 Solitary pulmonary nodule: Secondary | ICD-10-CM | POA: Diagnosis not present

## 2020-10-14 DIAGNOSIS — Z20822 Contact with and (suspected) exposure to covid-19: Secondary | ICD-10-CM | POA: Diagnosis present

## 2020-10-14 DIAGNOSIS — I129 Hypertensive chronic kidney disease with stage 1 through stage 4 chronic kidney disease, or unspecified chronic kidney disease: Secondary | ICD-10-CM | POA: Diagnosis present

## 2020-10-14 DIAGNOSIS — R404 Transient alteration of awareness: Secondary | ICD-10-CM | POA: Diagnosis not present

## 2020-10-14 DIAGNOSIS — I161 Hypertensive emergency: Secondary | ICD-10-CM | POA: Diagnosis not present

## 2020-10-14 DIAGNOSIS — J9622 Acute and chronic respiratory failure with hypercapnia: Secondary | ICD-10-CM | POA: Diagnosis present

## 2020-10-14 DIAGNOSIS — Z743 Need for continuous supervision: Secondary | ICD-10-CM | POA: Diagnosis not present

## 2020-10-14 DIAGNOSIS — W19XXXA Unspecified fall, initial encounter: Secondary | ICD-10-CM | POA: Diagnosis not present

## 2020-10-14 DIAGNOSIS — E871 Hypo-osmolality and hyponatremia: Secondary | ICD-10-CM | POA: Diagnosis present

## 2020-10-14 DIAGNOSIS — M6281 Muscle weakness (generalized): Secondary | ICD-10-CM | POA: Diagnosis not present

## 2020-10-14 DIAGNOSIS — M6259 Muscle wasting and atrophy, not elsewhere classified, multiple sites: Secondary | ICD-10-CM | POA: Diagnosis not present

## 2020-10-14 DIAGNOSIS — R0902 Hypoxemia: Secondary | ICD-10-CM | POA: Diagnosis not present

## 2020-10-14 DIAGNOSIS — R079 Chest pain, unspecified: Secondary | ICD-10-CM | POA: Diagnosis not present

## 2020-10-14 DIAGNOSIS — J441 Chronic obstructive pulmonary disease with (acute) exacerbation: Secondary | ICD-10-CM | POA: Diagnosis present

## 2020-10-14 DIAGNOSIS — Z7189 Other specified counseling: Secondary | ICD-10-CM | POA: Diagnosis not present

## 2020-10-14 DIAGNOSIS — R739 Hyperglycemia, unspecified: Secondary | ICD-10-CM | POA: Diagnosis not present

## 2020-10-14 DIAGNOSIS — E43 Unspecified severe protein-calorie malnutrition: Secondary | ICD-10-CM | POA: Diagnosis present

## 2020-10-14 DIAGNOSIS — R64 Cachexia: Secondary | ICD-10-CM | POA: Diagnosis present

## 2020-10-14 DIAGNOSIS — R0603 Acute respiratory distress: Secondary | ICD-10-CM | POA: Diagnosis not present

## 2020-10-14 DIAGNOSIS — I152 Hypertension secondary to endocrine disorders: Secondary | ICD-10-CM | POA: Diagnosis not present

## 2020-10-14 DIAGNOSIS — J9621 Acute and chronic respiratory failure with hypoxia: Secondary | ICD-10-CM | POA: Diagnosis present

## 2020-10-14 DIAGNOSIS — R1312 Dysphagia, oropharyngeal phase: Secondary | ICD-10-CM | POA: Diagnosis not present

## 2020-10-14 DIAGNOSIS — N179 Acute kidney failure, unspecified: Secondary | ICD-10-CM | POA: Diagnosis not present

## 2020-10-14 DIAGNOSIS — J9602 Acute respiratory failure with hypercapnia: Secondary | ICD-10-CM | POA: Diagnosis not present

## 2020-10-14 DIAGNOSIS — Z681 Body mass index (BMI) 19 or less, adult: Secondary | ICD-10-CM | POA: Diagnosis not present

## 2020-10-14 DIAGNOSIS — N183 Chronic kidney disease, stage 3 unspecified: Secondary | ICD-10-CM | POA: Diagnosis not present

## 2020-10-14 DIAGNOSIS — R110 Nausea: Secondary | ICD-10-CM | POA: Diagnosis not present

## 2020-10-14 DIAGNOSIS — R0602 Shortness of breath: Secondary | ICD-10-CM | POA: Diagnosis not present

## 2020-10-14 DIAGNOSIS — F32A Depression, unspecified: Secondary | ICD-10-CM | POA: Diagnosis present

## 2020-10-14 DIAGNOSIS — K567 Ileus, unspecified: Secondary | ICD-10-CM | POA: Diagnosis not present

## 2020-10-14 DIAGNOSIS — I469 Cardiac arrest, cause unspecified: Secondary | ICD-10-CM | POA: Diagnosis not present

## 2020-10-14 DIAGNOSIS — R262 Difficulty in walking, not elsewhere classified: Secondary | ICD-10-CM | POA: Diagnosis not present

## 2020-10-14 DIAGNOSIS — E875 Hyperkalemia: Secondary | ICD-10-CM | POA: Diagnosis not present

## 2020-10-14 DIAGNOSIS — E1051 Type 1 diabetes mellitus with diabetic peripheral angiopathy without gangrene: Secondary | ICD-10-CM | POA: Diagnosis present

## 2020-10-14 DIAGNOSIS — I739 Peripheral vascular disease, unspecified: Secondary | ICD-10-CM | POA: Diagnosis not present

## 2020-10-14 DIAGNOSIS — J189 Pneumonia, unspecified organism: Secondary | ICD-10-CM | POA: Diagnosis present

## 2020-10-14 DIAGNOSIS — R2689 Other abnormalities of gait and mobility: Secondary | ICD-10-CM | POA: Diagnosis not present

## 2020-10-14 DIAGNOSIS — R41841 Cognitive communication deficit: Secondary | ICD-10-CM | POA: Diagnosis not present

## 2020-10-14 DIAGNOSIS — E1022 Type 1 diabetes mellitus with diabetic chronic kidney disease: Secondary | ICD-10-CM | POA: Diagnosis present

## 2020-10-14 DIAGNOSIS — J9600 Acute respiratory failure, unspecified whether with hypoxia or hypercapnia: Secondary | ICD-10-CM | POA: Diagnosis not present

## 2020-10-14 DIAGNOSIS — E1042 Type 1 diabetes mellitus with diabetic polyneuropathy: Secondary | ICD-10-CM | POA: Diagnosis present

## 2020-10-14 DIAGNOSIS — D696 Thrombocytopenia, unspecified: Secondary | ICD-10-CM | POA: Diagnosis not present

## 2020-10-14 DIAGNOSIS — E1122 Type 2 diabetes mellitus with diabetic chronic kidney disease: Secondary | ICD-10-CM | POA: Diagnosis not present

## 2020-10-14 LAB — GLUCOSE, CAPILLARY
Glucose-Capillary: 207 mg/dL — ABNORMAL HIGH (ref 70–99)
Glucose-Capillary: 198 mg/dL — ABNORMAL HIGH (ref 70–99)
Glucose-Capillary: 338 mg/dL — ABNORMAL HIGH (ref 70–99)

## 2020-10-14 LAB — RESP PANEL BY RT-PCR (FLU A&B, COVID) ARPGX2
Influenza A by PCR: NEGATIVE
Influenza B by PCR: NEGATIVE
SARS Coronavirus 2 by RT PCR: NEGATIVE

## 2020-10-14 MED ORDER — COVID-19MRNA BIVAL VACC PFIZER 30 MCG/0.3ML IM SUSP
0.3000 mL | Freq: Once | INTRAMUSCULAR | Status: AC
  Administered 2020-10-14: 0.3 mL via INTRAMUSCULAR
  Filled 2020-10-14: qty 0.3

## 2020-10-14 MED ORDER — MS CONTIN 15 MG PO TBCR: 15.0000 mg | EXTENDED_RELEASE_TABLET | Freq: Three times a day (TID) | ORAL | 0 refills | Status: AC

## 2020-10-14 NOTE — TOC Transition Note (Addendum)
Transition of Care [facility]) - CM/SW Discharge Note   Patient Details  Name: [name] MRN: [id number] Date of Birth: 07-04-1955  Transition of Care [facility]) CM/SW Contact:  Ross Ludwig, LCSW Phone Number: 10/14/2020, 1:37 PM   Clinical Narrative:     CSW spoke to patient and his wife about SNF placement, first choice was [facility], however they do not have any beds available, second choice was [facility] they do not have any beds available, third choice was [facility].  Patient and wife are agreeable to [facility].  They do have bed available and can accept today once he receives his Covid Booster, and negative Covid test.  Patient stated he was a little worried that he may have a bed reaction from the booster, but he stated he is okay getting it.  CSW updated bedside nurse and attending physician.    Patient's Covid test was negative, and he will get the booster before he leaves hospital.  Patient to be d/c'ed today to [facility] room 408P.  Patient and family agreeable to plans will transport via ems RN to call report to [phone].  Patient's wife would like to be called once EMS arrives.         Final next level of care: Skilled Nursing Facility Barriers to Discharge: Barriers Resolved   Patient Goals and CMS Choice Patient states their goals for this hospitalization and ongoing recovery are:: To go to SNF for short term rehab, then return back home. CMS Medicare.gov Compare Post Acute Care list provided to:: Patient Represenative (must comment) Choice offered to / list presented to : Adult Children, Patient  Discharge Placement   Existing PASRR number confirmed : 10/13/20          Patient [facility] bed at: [facility] Patient to be transferred to facility by: [facility] Name of family member notified: Wife [name] Patient and family notified of of transfer: 10/14/20  Discharge Plan and Services                                     Social  Determinants of Health (SDOH) Interventions     Readmission Risk Interventions No flowsheet data found.

## 2020-10-14 NOTE — Progress Notes (Signed)
Patient seen and examined this morning, stable for discharge, discharge summary completed yesterday  [name] M. Cruzita Lederer, MD, PhD [facility]  Between 7 am - 7 pm you can contact me via [place] (for emergencies) or [place] (non urgent matters).  I am not available 7 pm - 7 am, please contact night coverage MD/APP via Amion

## 2020-10-14 NOTE — Progress Notes (Signed)
Report called to Diane at [facility].  Patient aware and agreeable of DC plan.  AVS placed in packet.

## 2020-10-14 NOTE — Progress Notes (Signed)
Pt's wife called and updated taht pt still waiting for [facility] transport.

## 2020-10-15 DIAGNOSIS — Z9189 Other specified personal risk factors, not elsewhere classified: Secondary | ICD-10-CM | POA: Diagnosis not present

## 2020-10-15 DIAGNOSIS — J9600 Acute respiratory failure, unspecified whether with hypoxia or hypercapnia: Secondary | ICD-10-CM | POA: Diagnosis not present

## 2020-10-15 DIAGNOSIS — D696 Thrombocytopenia, unspecified: Secondary | ICD-10-CM | POA: Diagnosis not present

## 2020-10-15 DIAGNOSIS — I152 Hypertension secondary to endocrine disorders: Secondary | ICD-10-CM | POA: Diagnosis not present

## 2020-10-15 DIAGNOSIS — D638 Anemia in other chronic diseases classified elsewhere: Secondary | ICD-10-CM | POA: Diagnosis not present

## 2020-10-15 DIAGNOSIS — J9611 Chronic respiratory failure with hypoxia: Secondary | ICD-10-CM | POA: Diagnosis not present

## 2020-10-15 DIAGNOSIS — N179 Acute kidney failure, unspecified: Secondary | ICD-10-CM | POA: Diagnosis not present

## 2020-10-15 DIAGNOSIS — J449 Chronic obstructive pulmonary disease, unspecified: Secondary | ICD-10-CM | POA: Diagnosis not present

## 2020-10-15 DIAGNOSIS — I739 Peripheral vascular disease, unspecified: Secondary | ICD-10-CM | POA: Diagnosis not present

## 2020-10-15 DIAGNOSIS — R911 Solitary pulmonary nodule: Secondary | ICD-10-CM | POA: Diagnosis not present

## 2020-10-15 DIAGNOSIS — E1159 Type 2 diabetes mellitus with other circulatory complications: Secondary | ICD-10-CM | POA: Diagnosis not present

## 2020-10-15 DIAGNOSIS — N183 Chronic kidney disease, stage 3 unspecified: Secondary | ICD-10-CM | POA: Diagnosis not present

## 2020-10-18 DIAGNOSIS — R110 Nausea: Secondary | ICD-10-CM | POA: Diagnosis not present

## 2020-10-18 DIAGNOSIS — E1159 Type 2 diabetes mellitus with other circulatory complications: Secondary | ICD-10-CM | POA: Diagnosis not present

## 2020-10-18 DIAGNOSIS — R079 Chest pain, unspecified: Secondary | ICD-10-CM | POA: Diagnosis not present

## 2020-10-18 DIAGNOSIS — I152 Hypertension secondary to endocrine disorders: Secondary | ICD-10-CM | POA: Diagnosis not present

## 2020-10-19 ENCOUNTER — Inpatient Hospital Stay (HOSPITAL_COMMUNITY): Payer: Medicare Other

## 2020-10-19 ENCOUNTER — Emergency Department (HOSPITAL_COMMUNITY): Payer: Medicare Other

## 2020-10-19 ENCOUNTER — Inpatient Hospital Stay (HOSPITAL_COMMUNITY)
Admission: EM | Admit: 2020-10-19 | Discharge: 2020-11-16 | DRG: 870 | Disposition: E | Payer: Medicare Other | Source: Skilled Nursing Facility | Attending: Critical Care Medicine | Admitting: Critical Care Medicine

## 2020-10-19 ENCOUNTER — Encounter (HOSPITAL_COMMUNITY): Payer: Self-pay | Admitting: Internal Medicine

## 2020-10-19 DIAGNOSIS — Z978 Presence of other specified devices: Secondary | ICD-10-CM

## 2020-10-19 DIAGNOSIS — Z515 Encounter for palliative care: Secondary | ICD-10-CM | POA: Diagnosis not present

## 2020-10-19 DIAGNOSIS — J440 Chronic obstructive pulmonary disease with acute lower respiratory infection: Secondary | ICD-10-CM | POA: Diagnosis present

## 2020-10-19 DIAGNOSIS — Z7951 Long term (current) use of inhaled steroids: Secondary | ICD-10-CM

## 2020-10-19 DIAGNOSIS — R410 Disorientation, unspecified: Secondary | ICD-10-CM | POA: Diagnosis not present

## 2020-10-19 DIAGNOSIS — M8448XA Pathological fracture, other site, initial encounter for fracture: Secondary | ICD-10-CM | POA: Diagnosis not present

## 2020-10-19 DIAGNOSIS — L89156 Pressure-induced deep tissue damage of sacral region: Secondary | ICD-10-CM | POA: Diagnosis present

## 2020-10-19 DIAGNOSIS — F32A Depression, unspecified: Secondary | ICD-10-CM | POA: Diagnosis present

## 2020-10-19 DIAGNOSIS — I469 Cardiac arrest, cause unspecified: Secondary | ICD-10-CM | POA: Diagnosis not present

## 2020-10-19 DIAGNOSIS — A419 Sepsis, unspecified organism: Secondary | ICD-10-CM | POA: Diagnosis not present

## 2020-10-19 DIAGNOSIS — E871 Hypo-osmolality and hyponatremia: Secondary | ICD-10-CM | POA: Diagnosis present

## 2020-10-19 DIAGNOSIS — Z452 Encounter for adjustment and management of vascular access device: Secondary | ICD-10-CM | POA: Diagnosis not present

## 2020-10-19 DIAGNOSIS — E1051 Type 1 diabetes mellitus with diabetic peripheral angiopathy without gangrene: Secondary | ICD-10-CM | POA: Diagnosis present

## 2020-10-19 DIAGNOSIS — J9600 Acute respiratory failure, unspecified whether with hypoxia or hypercapnia: Secondary | ICD-10-CM | POA: Diagnosis not present

## 2020-10-19 DIAGNOSIS — Z885 Allergy status to narcotic agent status: Secondary | ICD-10-CM

## 2020-10-19 DIAGNOSIS — M47812 Spondylosis without myelopathy or radiculopathy, cervical region: Secondary | ICD-10-CM | POA: Diagnosis not present

## 2020-10-19 DIAGNOSIS — K567 Ileus, unspecified: Secondary | ICD-10-CM | POA: Diagnosis not present

## 2020-10-19 DIAGNOSIS — Z20822 Contact with and (suspected) exposure to covid-19: Secondary | ICD-10-CM | POA: Diagnosis present

## 2020-10-19 DIAGNOSIS — H903 Sensorineural hearing loss, bilateral: Secondary | ICD-10-CM | POA: Diagnosis present

## 2020-10-19 DIAGNOSIS — R34 Anuria and oliguria: Secondary | ICD-10-CM | POA: Diagnosis present

## 2020-10-19 DIAGNOSIS — Z7189 Other specified counseling: Secondary | ICD-10-CM | POA: Diagnosis not present

## 2020-10-19 DIAGNOSIS — E1022 Type 1 diabetes mellitus with diabetic chronic kidney disease: Secondary | ICD-10-CM | POA: Diagnosis present

## 2020-10-19 DIAGNOSIS — J9602 Acute respiratory failure with hypercapnia: Secondary | ICD-10-CM | POA: Diagnosis not present

## 2020-10-19 DIAGNOSIS — R531 Weakness: Secondary | ICD-10-CM | POA: Diagnosis not present

## 2020-10-19 DIAGNOSIS — I129 Hypertensive chronic kidney disease with stage 1 through stage 4 chronic kidney disease, or unspecified chronic kidney disease: Secondary | ICD-10-CM | POA: Diagnosis present

## 2020-10-19 DIAGNOSIS — N179 Acute kidney failure, unspecified: Secondary | ICD-10-CM | POA: Diagnosis not present

## 2020-10-19 DIAGNOSIS — E860 Dehydration: Secondary | ICD-10-CM | POA: Diagnosis present

## 2020-10-19 DIAGNOSIS — Z043 Encounter for examination and observation following other accident: Secondary | ICD-10-CM | POA: Diagnosis not present

## 2020-10-19 DIAGNOSIS — Z9289 Personal history of other medical treatment: Secondary | ICD-10-CM

## 2020-10-19 DIAGNOSIS — R0603 Acute respiratory distress: Secondary | ICD-10-CM | POA: Diagnosis not present

## 2020-10-19 DIAGNOSIS — E875 Hyperkalemia: Secondary | ICD-10-CM | POA: Diagnosis present

## 2020-10-19 DIAGNOSIS — R4182 Altered mental status, unspecified: Secondary | ICD-10-CM | POA: Diagnosis not present

## 2020-10-19 DIAGNOSIS — J189 Pneumonia, unspecified organism: Secondary | ICD-10-CM | POA: Diagnosis present

## 2020-10-19 DIAGNOSIS — J80 Acute respiratory distress syndrome: Secondary | ICD-10-CM | POA: Diagnosis not present

## 2020-10-19 DIAGNOSIS — J9622 Acute and chronic respiratory failure with hypercapnia: Secondary | ICD-10-CM | POA: Diagnosis present

## 2020-10-19 DIAGNOSIS — R739 Hyperglycemia, unspecified: Secondary | ICD-10-CM | POA: Diagnosis not present

## 2020-10-19 DIAGNOSIS — J441 Chronic obstructive pulmonary disease with (acute) exacerbation: Secondary | ICD-10-CM | POA: Diagnosis present

## 2020-10-19 DIAGNOSIS — Z681 Body mass index (BMI) 19 or less, adult: Secondary | ICD-10-CM | POA: Diagnosis not present

## 2020-10-19 DIAGNOSIS — G9341 Metabolic encephalopathy: Secondary | ICD-10-CM | POA: Diagnosis not present

## 2020-10-19 DIAGNOSIS — R29898 Other symptoms and signs involving the musculoskeletal system: Secondary | ICD-10-CM | POA: Diagnosis not present

## 2020-10-19 DIAGNOSIS — R4020 Unspecified coma: Secondary | ICD-10-CM | POA: Diagnosis not present

## 2020-10-19 DIAGNOSIS — J9690 Respiratory failure, unspecified, unspecified whether with hypoxia or hypercapnia: Secondary | ICD-10-CM | POA: Diagnosis not present

## 2020-10-19 DIAGNOSIS — I9581 Postprocedural hypotension: Secondary | ICD-10-CM | POA: Diagnosis not present

## 2020-10-19 DIAGNOSIS — E1065 Type 1 diabetes mellitus with hyperglycemia: Secondary | ICD-10-CM | POA: Diagnosis not present

## 2020-10-19 DIAGNOSIS — W19XXXA Unspecified fall, initial encounter: Secondary | ICD-10-CM

## 2020-10-19 DIAGNOSIS — E43 Unspecified severe protein-calorie malnutrition: Secondary | ICD-10-CM | POA: Diagnosis present

## 2020-10-19 DIAGNOSIS — Z66 Do not resuscitate: Secondary | ICD-10-CM | POA: Diagnosis not present

## 2020-10-19 DIAGNOSIS — J9621 Acute and chronic respiratory failure with hypoxia: Secondary | ICD-10-CM | POA: Diagnosis present

## 2020-10-19 DIAGNOSIS — Z833 Family history of diabetes mellitus: Secondary | ICD-10-CM

## 2020-10-19 DIAGNOSIS — R64 Cachexia: Secondary | ICD-10-CM | POA: Diagnosis present

## 2020-10-19 DIAGNOSIS — R6521 Severe sepsis with septic shock: Secondary | ICD-10-CM | POA: Diagnosis not present

## 2020-10-19 DIAGNOSIS — Z888 Allergy status to other drugs, medicaments and biological substances status: Secondary | ICD-10-CM

## 2020-10-19 DIAGNOSIS — M40204 Unspecified kyphosis, thoracic region: Secondary | ICD-10-CM | POA: Diagnosis not present

## 2020-10-19 DIAGNOSIS — Z9911 Dependence on respirator [ventilator] status: Secondary | ICD-10-CM | POA: Diagnosis not present

## 2020-10-19 DIAGNOSIS — R918 Other nonspecific abnormal finding of lung field: Secondary | ICD-10-CM

## 2020-10-19 DIAGNOSIS — Z794 Long term (current) use of insulin: Secondary | ICD-10-CM

## 2020-10-19 DIAGNOSIS — E1159 Type 2 diabetes mellitus with other circulatory complications: Secondary | ICD-10-CM | POA: Diagnosis not present

## 2020-10-19 DIAGNOSIS — Z6820 Body mass index (BMI) 20.0-20.9, adult: Secondary | ICD-10-CM

## 2020-10-19 DIAGNOSIS — Z4682 Encounter for fitting and adjustment of non-vascular catheter: Secondary | ICD-10-CM | POA: Diagnosis not present

## 2020-10-19 DIAGNOSIS — G4733 Obstructive sleep apnea (adult) (pediatric): Secondary | ICD-10-CM | POA: Diagnosis present

## 2020-10-19 DIAGNOSIS — Z8042 Family history of malignant neoplasm of prostate: Secondary | ICD-10-CM

## 2020-10-19 DIAGNOSIS — Z91199 Patient's noncompliance with other medical treatment and regimen due to unspecified reason: Secondary | ICD-10-CM

## 2020-10-19 DIAGNOSIS — M19012 Primary osteoarthritis, left shoulder: Secondary | ICD-10-CM | POA: Diagnosis not present

## 2020-10-19 DIAGNOSIS — R54 Age-related physical debility: Secondary | ICD-10-CM | POA: Diagnosis present

## 2020-10-19 DIAGNOSIS — E1042 Type 1 diabetes mellitus with diabetic polyneuropathy: Secondary | ICD-10-CM | POA: Diagnosis present

## 2020-10-19 DIAGNOSIS — T4275XA Adverse effect of unspecified antiepileptic and sedative-hypnotic drugs, initial encounter: Secondary | ICD-10-CM | POA: Diagnosis not present

## 2020-10-19 DIAGNOSIS — Z79891 Long term (current) use of opiate analgesic: Secondary | ICD-10-CM

## 2020-10-19 DIAGNOSIS — Z881 Allergy status to other antibiotic agents status: Secondary | ICD-10-CM

## 2020-10-19 DIAGNOSIS — K6389 Other specified diseases of intestine: Secondary | ICD-10-CM | POA: Diagnosis not present

## 2020-10-19 DIAGNOSIS — Z4659 Encounter for fitting and adjustment of other gastrointestinal appliance and device: Secondary | ICD-10-CM

## 2020-10-19 DIAGNOSIS — Z9889 Other specified postprocedural states: Secondary | ICD-10-CM | POA: Diagnosis not present

## 2020-10-19 DIAGNOSIS — N1832 Chronic kidney disease, stage 3b: Secondary | ICD-10-CM | POA: Diagnosis present

## 2020-10-19 DIAGNOSIS — R0602 Shortness of breath: Secondary | ICD-10-CM

## 2020-10-19 DIAGNOSIS — Z8249 Family history of ischemic heart disease and other diseases of the circulatory system: Secondary | ICD-10-CM

## 2020-10-19 DIAGNOSIS — E785 Hyperlipidemia, unspecified: Secondary | ICD-10-CM | POA: Diagnosis present

## 2020-10-19 DIAGNOSIS — G253 Myoclonus: Secondary | ICD-10-CM | POA: Diagnosis not present

## 2020-10-19 DIAGNOSIS — Z9981 Dependence on supplemental oxygen: Secondary | ICD-10-CM

## 2020-10-19 DIAGNOSIS — J90 Pleural effusion, not elsewhere classified: Secondary | ICD-10-CM | POA: Diagnosis not present

## 2020-10-19 DIAGNOSIS — G319 Degenerative disease of nervous system, unspecified: Secondary | ICD-10-CM | POA: Diagnosis not present

## 2020-10-19 DIAGNOSIS — Z79899 Other long term (current) drug therapy: Secondary | ICD-10-CM

## 2020-10-19 DIAGNOSIS — Z974 Presence of external hearing-aid: Secondary | ICD-10-CM

## 2020-10-19 DIAGNOSIS — R404 Transient alteration of awareness: Secondary | ICD-10-CM | POA: Diagnosis not present

## 2020-10-19 DIAGNOSIS — F17210 Nicotine dependence, cigarettes, uncomplicated: Secondary | ICD-10-CM | POA: Diagnosis present

## 2020-10-19 DIAGNOSIS — J9811 Atelectasis: Secondary | ICD-10-CM | POA: Diagnosis not present

## 2020-10-19 DIAGNOSIS — Z7982 Long term (current) use of aspirin: Secondary | ICD-10-CM

## 2020-10-19 DIAGNOSIS — I161 Hypertensive emergency: Secondary | ICD-10-CM | POA: Diagnosis not present

## 2020-10-19 DIAGNOSIS — Z9621 Cochlear implant status: Secondary | ICD-10-CM | POA: Diagnosis present

## 2020-10-19 DIAGNOSIS — K5939 Other megacolon: Secondary | ICD-10-CM | POA: Diagnosis not present

## 2020-10-19 DIAGNOSIS — R001 Bradycardia, unspecified: Secondary | ICD-10-CM | POA: Diagnosis not present

## 2020-10-19 DIAGNOSIS — J449 Chronic obstructive pulmonary disease, unspecified: Secondary | ICD-10-CM | POA: Diagnosis not present

## 2020-10-19 DIAGNOSIS — M4802 Spinal stenosis, cervical region: Secondary | ICD-10-CM | POA: Diagnosis not present

## 2020-10-19 DIAGNOSIS — R292 Abnormal reflex: Secondary | ICD-10-CM | POA: Diagnosis not present

## 2020-10-19 LAB — CBC WITH DIFFERENTIAL/PLATELET
Abs Immature Granulocytes: 1.07 10*3/uL — ABNORMAL HIGH (ref 0.00–0.07)
Basophils Absolute: 0.1 10*3/uL (ref 0.0–0.1)
Basophils Relative: 1 %
Eosinophils Absolute: 0 10*3/uL (ref 0.0–0.5)
Eosinophils Relative: 0 %
HCT: 44.5 % (ref 39.0–52.0)
Hemoglobin: 14.4 g/dL (ref 13.0–17.0)
Immature Granulocytes: 4 %
Lymphocytes Relative: 6 %
Lymphs Abs: 1.7 10*3/uL (ref 0.7–4.0)
MCH: 28.9 pg (ref 26.0–34.0)
MCHC: 32.4 g/dL (ref 30.0–36.0)
MCV: 89.2 fL (ref 80.0–100.0)
Monocytes Absolute: 0.4 10*3/uL (ref 0.1–1.0)
Monocytes Relative: 2 %
Neutro Abs: 22.8 10*3/uL — ABNORMAL HIGH (ref 1.7–7.7)
Neutrophils Relative %: 87 %
Platelets: 375 10*3/uL (ref 150–400)
RBC: 4.99 MIL/uL (ref 4.22–5.81)
RDW: 12.9 % (ref 11.5–15.5)
WBC: 26.1 10*3/uL — ABNORMAL HIGH (ref 4.0–10.5)
nRBC: 0 % (ref 0.0–0.2)

## 2020-10-19 LAB — I-STAT CHEM 8, ED
BUN: 37 mg/dL — ABNORMAL HIGH (ref 8–23)
Calcium, Ion: 0.82 mmol/L — CL (ref 1.15–1.40)
Chloride: 103 mmol/L (ref 98–111)
Creatinine, Ser: 1.4 mg/dL — ABNORMAL HIGH (ref 0.61–1.24)
Glucose, Bld: 370 mg/dL — ABNORMAL HIGH (ref 70–99)
HCT: 38 % — ABNORMAL LOW (ref 39.0–52.0)
Hemoglobin: 12.9 g/dL — ABNORMAL LOW (ref 13.0–17.0)
Potassium: 5.1 mmol/L (ref 3.5–5.1)
Sodium: 134 mmol/L — ABNORMAL LOW (ref 135–145)
TCO2: 24 mmol/L (ref 22–32)

## 2020-10-19 LAB — VALPROIC ACID LEVEL: Valproic Acid Lvl: 24 ug/mL — ABNORMAL LOW (ref 50.0–100.0)

## 2020-10-19 LAB — I-STAT ARTERIAL BLOOD GAS, ED
Acid-Base Excess: 4 mmol/L — ABNORMAL HIGH (ref 0.0–2.0)
Acid-Base Excess: 2 mmol/L (ref 0.0–2.0)
Bicarbonate: 33.7 mmol/L — ABNORMAL HIGH (ref 20.0–28.0)
Bicarbonate: 28.9 mmol/L — ABNORMAL HIGH (ref 20.0–28.0)
Calcium, Ion: 1.21 mmol/L (ref 1.15–1.40)
Calcium, Ion: 1.11 mmol/L — ABNORMAL LOW (ref 1.15–1.40)
HCT: 42 % (ref 39.0–52.0)
HCT: 33 % — ABNORMAL LOW (ref 39.0–52.0)
Hemoglobin: 14.3 g/dL (ref 13.0–17.0)
Hemoglobin: 11.2 g/dL — ABNORMAL LOW (ref 13.0–17.0)
O2 Saturation: 91 %
O2 Saturation: 100 %
Patient temperature: 98.6
Patient temperature: 98.6
Potassium: 4.2 mmol/L (ref 3.5–5.1)
Potassium: 4.5 mmol/L (ref 3.5–5.1)
Sodium: 137 mmol/L (ref 135–145)
Sodium: 138 mmol/L (ref 135–145)
TCO2: 36 mmol/L — ABNORMAL HIGH (ref 22–32)
TCO2: 31 mmol/L (ref 22–32)
pCO2 arterial: 78.7 mmHg (ref 32.0–48.0)
pCO2 arterial: 56.8 mmHg — ABNORMAL HIGH (ref 32.0–48.0)
pH, Arterial: 7.239 — ABNORMAL LOW (ref 7.350–7.450)
pH, Arterial: 7.315 — ABNORMAL LOW (ref 7.350–7.450)
pO2, Arterial: 74 mmHg — ABNORMAL LOW (ref 83.0–108.0)
pO2, Arterial: 334 mmHg — ABNORMAL HIGH (ref 83.0–108.0)

## 2020-10-19 LAB — BASIC METABOLIC PANEL
Anion gap: 12 (ref 5–15)
Anion gap: 10 (ref 5–15)
BUN: 33 mg/dL — ABNORMAL HIGH (ref 8–23)
BUN: 35 mg/dL — ABNORMAL HIGH (ref 8–23)
CO2: 29 mmol/L (ref 22–32)
CO2: 27 mmol/L (ref 22–32)
Calcium: 8.7 mg/dL — ABNORMAL LOW (ref 8.9–10.3)
Calcium: 7.9 mg/dL — ABNORMAL LOW (ref 8.9–10.3)
Chloride: 96 mmol/L — ABNORMAL LOW (ref 98–111)
Chloride: 101 mmol/L (ref 98–111)
Creatinine, Ser: 1.47 mg/dL — ABNORMAL HIGH (ref 0.61–1.24)
Creatinine, Ser: 1.63 mg/dL — ABNORMAL HIGH (ref 0.61–1.24)
GFR, Estimated: 52 mL/min — ABNORMAL LOW (ref >60)
GFR, Estimated: 46 mL/min — ABNORMAL LOW (ref >60)
Glucose, Bld: 388 mg/dL — ABNORMAL HIGH (ref 70–99)
Glucose, Bld: 326 mg/dL — ABNORMAL HIGH (ref 70–99)
Potassium: 3.9 mmol/L (ref 3.5–5.1)
Potassium: 4.4 mmol/L (ref 3.5–5.1)
Sodium: 137 mmol/L (ref 135–145)
Sodium: 138 mmol/L (ref 135–145)

## 2020-10-19 LAB — PROCALCITONIN: Procalcitonin: 0.58 ng/mL

## 2020-10-19 LAB — CBC
HCT: 36.9 % — ABNORMAL LOW (ref 39.0–52.0)
Hemoglobin: 11.7 g/dL — ABNORMAL LOW (ref 13.0–17.0)
MCH: 28.8 pg (ref 26.0–34.0)
MCHC: 31.7 g/dL (ref 30.0–36.0)
MCV: 90.9 fL (ref 80.0–100.0)
Platelets: 233 10*3/uL (ref 150–400)
RBC: 4.06 MIL/uL — ABNORMAL LOW (ref 4.22–5.81)
RDW: 12.9 % (ref 11.5–15.5)
WBC: 14 10*3/uL — ABNORMAL HIGH (ref 4.0–10.5)
nRBC: 0 % (ref 0.0–0.2)

## 2020-10-19 LAB — LACTIC ACID, PLASMA
Lactic Acid, Venous: 3 mmol/L (ref 0.5–1.9)
Lactic Acid, Venous: 3.7 mmol/L (ref 0.5–1.9)

## 2020-10-19 LAB — BRAIN NATRIURETIC PEPTIDE: B Natriuretic Peptide: 559.7 pg/mL — ABNORMAL HIGH (ref 0.0–100.0)

## 2020-10-19 LAB — CREATININE, SERUM
Creatinine, Ser: 1.54 mg/dL — ABNORMAL HIGH (ref 0.61–1.24)
GFR, Estimated: 49 mL/min — ABNORMAL LOW (ref >60)

## 2020-10-19 LAB — PHOSPHORUS: Phosphorus: 5.6 mg/dL — ABNORMAL HIGH (ref 2.5–4.6)

## 2020-10-19 LAB — TROPONIN I (HIGH SENSITIVITY)
Troponin I (High Sensitivity): 29 ng/L — ABNORMAL HIGH (ref <18)
Troponin I (High Sensitivity): 42 ng/L — ABNORMAL HIGH (ref <18)

## 2020-10-19 LAB — RESP PANEL BY RT-PCR (FLU A&B, COVID) ARPGX2
Influenza A by PCR: NEGATIVE
Influenza B by PCR: NEGATIVE
SARS Coronavirus 2 by RT PCR: NEGATIVE

## 2020-10-19 LAB — MRSA NEXT GEN BY PCR, NASAL: MRSA by PCR Next Gen: NOT DETECTED

## 2020-10-19 LAB — GLUCOSE, CAPILLARY
Glucose-Capillary: 332 mg/dL — ABNORMAL HIGH (ref 70–99)
Glucose-Capillary: 239 mg/dL — ABNORMAL HIGH (ref 70–99)
Glucose-Capillary: 146 mg/dL — ABNORMAL HIGH (ref 70–99)

## 2020-10-19 LAB — MAGNESIUM: Magnesium: 2.2 mg/dL (ref 1.7–2.4)

## 2020-10-19 MED ORDER — PANTOPRAZOLE 2 MG/ML SUSPENSION
40.0000 mg | Freq: Every day | ORAL | Status: DC
  Administered 2020-10-19 – 2020-10-20 (×2): 40 mg
  Filled 2020-10-19 (×4): qty 20

## 2020-10-19 MED ORDER — ONDANSETRON HCL 4 MG/2ML IJ SOLN: 4.0000 mg | Freq: Four times a day (QID) | INTRAMUSCULAR | Status: DC | PRN

## 2020-10-19 MED ORDER — POLYETHYLENE GLYCOL 3350 17 G PO PACK
17.0000 g | PACK | Freq: Every day | ORAL | Status: DC
  Administered 2020-10-20 – 2020-10-27 (×7): 17 g
  Filled 2020-10-19 (×7): qty 1

## 2020-10-19 MED ORDER — LACTATED RINGERS IV BOLUS
1000.0000 mL | Freq: Once | INTRAVENOUS | Status: AC
  Administered 2020-10-19: 1000 mL via INTRAVENOUS

## 2020-10-19 MED ORDER — IPRATROPIUM-ALBUTEROL 0.5-2.5 (3) MG/3ML IN SOLN
3.0000 mL | Freq: Four times a day (QID) | RESPIRATORY_TRACT | Status: DC
  Administered 2020-10-19 – 2020-10-22 (×11): 3 mL via RESPIRATORY_TRACT
  Filled 2020-10-19 (×11): qty 3

## 2020-10-19 MED ORDER — ETOMIDATE 2 MG/ML IV SOLN
INTRAVENOUS | Status: AC | PRN
  Administered 2020-10-19: 20 mg via INTRAVENOUS

## 2020-10-19 MED ORDER — DOCUSATE SODIUM 100 MG PO CAPS: 100.0000 mg | ORAL_CAPSULE | Freq: Two times a day (BID) | ORAL | Status: DC | PRN

## 2020-10-19 MED ORDER — SODIUM CHLORIDE 0.9 % IV SOLN: INTRAVENOUS | Status: DC

## 2020-10-19 MED ORDER — SODIUM CHLORIDE 0.9 % IV SOLN
500.0000 mg | INTRAVENOUS | Status: DC
  Administered 2020-10-19 – 2020-10-23 (×5): 500 mg via INTRAVENOUS
  Filled 2020-10-19 (×5): qty 500

## 2020-10-19 MED ORDER — FENTANYL CITRATE PF 50 MCG/ML IJ SOSY: 25.0000 ug | PREFILLED_SYRINGE | Freq: Once | INTRAMUSCULAR | Status: DC

## 2020-10-19 MED ORDER — ALBUTEROL SULFATE (2.5 MG/3ML) 0.083% IN NEBU
2.5000 mg | INHALATION_SOLUTION | RESPIRATORY_TRACT | Status: DC | PRN
  Administered 2020-10-21: 2.5 mg via RESPIRATORY_TRACT
  Filled 2020-10-19: qty 3

## 2020-10-19 MED ORDER — DIVALPROEX SODIUM 125 MG PO CSDR
250.0000 mg | DELAYED_RELEASE_CAPSULE | Freq: Every day | ORAL | Status: DC
  Administered 2020-10-19 – 2020-10-22 (×3): 250 mg via ORAL
  Filled 2020-10-19 (×4): qty 2

## 2020-10-19 MED ORDER — ATROPINE SULFATE 1 MG/ML IJ SOLN
INTRAMUSCULAR | Status: AC | PRN
  Administered 2020-10-19: 1 mg via INTRAVENOUS

## 2020-10-19 MED ORDER — ADULT MULTIVITAMIN LIQUID CH
15.0000 mL | Freq: Every day | ORAL | Status: DC
  Administered 2020-10-19 – 2020-10-20 (×2): 15 mL
  Filled 2020-10-19 (×2): qty 15

## 2020-10-19 MED ORDER — INSULIN ASPART 100 UNIT/ML IJ SOLN
0.0000 [IU] | INTRAMUSCULAR | Status: DC
  Administered 2020-10-19: 9 [IU] via SUBCUTANEOUS

## 2020-10-19 MED ORDER — CHLORHEXIDINE GLUCONATE 0.12% ORAL RINSE (MEDLINE KIT)
15.0000 mL | Freq: Two times a day (BID) | OROMUCOSAL | Status: DC
  Administered 2020-10-19 – 2020-10-21 (×4): 15 mL via OROMUCOSAL

## 2020-10-19 MED ORDER — PROSOURCE TF PO LIQD
45.0000 mL | Freq: Two times a day (BID) | ORAL | Status: DC
  Administered 2020-10-19 – 2020-10-20 (×2): 45 mL
  Filled 2020-10-19 (×2): qty 45

## 2020-10-19 MED ORDER — POLYETHYLENE GLYCOL 3350 17 G PO PACK: 17.0000 g | PACK | Freq: Every day | ORAL | Status: DC | PRN

## 2020-10-19 MED ORDER — INSULIN DETEMIR 100 UNIT/ML ~~LOC~~ SOLN
20.0000 [IU] | Freq: Two times a day (BID) | SUBCUTANEOUS | Status: DC
Start: 2020-10-19 — End: 2020-10-21
  Administered 2020-10-19 – 2020-10-20 (×3): 20 [IU] via SUBCUTANEOUS
  Filled 2020-10-19 (×5): qty 0.2

## 2020-10-19 MED ORDER — HEPARIN SODIUM (PORCINE) 5000 UNIT/ML IJ SOLN
5000.0000 [IU] | Freq: Three times a day (TID) | INTRAMUSCULAR | Status: DC
  Administered 2020-10-19 – 2020-10-24 (×12): 5000 [IU] via SUBCUTANEOUS
  Filled 2020-10-19 (×12): qty 1

## 2020-10-19 MED ORDER — INSULIN ASPART 100 UNIT/ML IJ SOLN
0.0000 [IU] | INTRAMUSCULAR | Status: DC
  Administered 2020-10-19: 5 [IU] via SUBCUTANEOUS
  Administered 2020-10-20 (×2): 3 [IU] via SUBCUTANEOUS
  Administered 2020-10-20 (×3): 2 [IU] via SUBCUTANEOUS

## 2020-10-19 MED ORDER — BUDESONIDE 0.25 MG/2ML IN SUSP
0.2500 mg | Freq: Two times a day (BID) | RESPIRATORY_TRACT | Status: DC
  Administered 2020-10-19 – 2020-10-28 (×17): 0.25 mg via RESPIRATORY_TRACT
  Filled 2020-10-19 (×17): qty 2

## 2020-10-19 MED ORDER — SUCCINYLCHOLINE CHLORIDE 20 MG/ML IJ SOLN
INTRAMUSCULAR | Status: AC | PRN
  Administered 2020-10-19: 100 mg via INTRAVENOUS

## 2020-10-19 MED ORDER — CHLORHEXIDINE GLUCONATE CLOTH 2 % EX PADS
6.0000 | MEDICATED_PAD | Freq: Every day | CUTANEOUS | Status: DC
  Administered 2020-10-19 – 2020-10-28 (×9): 6 via TOPICAL

## 2020-10-19 MED ORDER — SODIUM CHLORIDE 0.9 % IV SOLN
2.0000 g | INTRAVENOUS | Status: DC
  Administered 2020-10-19: 2 g via INTRAVENOUS
  Filled 2020-10-19: qty 20

## 2020-10-19 MED ORDER — METHYLPREDNISOLONE SODIUM SUCC 125 MG IJ SOLR
40.0000 mg | Freq: Two times a day (BID) | INTRAMUSCULAR | Status: DC
  Administered 2020-10-19 – 2020-10-24 (×10): 40 mg via INTRAVENOUS
  Filled 2020-10-19 (×10): qty 2

## 2020-10-19 MED ORDER — FENTANYL 2500MCG IN NS 250ML (10MCG/ML) PREMIX INFUSION
25.0000 ug/h | INTRAVENOUS | Status: DC
  Administered 2020-10-19 – 2020-10-20 (×2): 50 ug/h via INTRAVENOUS
  Administered 2020-10-20: 150 ug/h via INTRAVENOUS
  Filled 2020-10-19 (×2): qty 250

## 2020-10-19 MED ORDER — PROPOFOL 1000 MG/100ML IV EMUL
0.0000 ug/kg/min | INTRAVENOUS | Status: DC
  Administered 2020-10-19: 20 ug/kg/min via INTRAVENOUS
  Administered 2020-10-20: 35 ug/kg/min via INTRAVENOUS
  Administered 2020-10-20 (×2): 30 ug/kg/min via INTRAVENOUS
  Administered 2020-10-21 (×2): 40 ug/kg/min via INTRAVENOUS
  Filled 2020-10-19 (×5): qty 100

## 2020-10-19 MED ORDER — LACTATED RINGERS IV SOLN: INTRAVENOUS | Status: DC

## 2020-10-19 MED ORDER — VITAL HIGH PROTEIN PO LIQD
1000.0000 mL | ORAL | Status: DC
  Administered 2020-10-19: 1000 mL

## 2020-10-19 MED ORDER — FENTANYL BOLUS VIA INFUSION
25.0000 ug | INTRAVENOUS | Status: DC | PRN
  Administered 2020-10-19 (×2): 25 ug via INTRAVENOUS
  Administered 2020-10-20: 50 ug via INTRAVENOUS
  Administered 2020-10-20: 25 ug via INTRAVENOUS
  Administered 2020-10-20: 75 ug via INTRAVENOUS
  Administered 2020-10-20 (×2): 50 ug via INTRAVENOUS
  Administered 2020-10-20: 100 ug via INTRAVENOUS
  Administered 2020-10-20: 75 ug via INTRAVENOUS
  Administered 2020-10-20 – 2020-10-21 (×5): 100 ug via INTRAVENOUS
  Filled 2020-10-19: qty 100

## 2020-10-19 MED ORDER — DOCUSATE SODIUM 50 MG/5ML PO LIQD
100.0000 mg | Freq: Two times a day (BID) | ORAL | Status: DC
  Administered 2020-10-19 – 2020-10-27 (×15): 100 mg
  Filled 2020-10-19 (×15): qty 10

## 2020-10-19 MED ORDER — FENTANYL CITRATE (PF) 100 MCG/2ML IJ SOLN: 25.0000 ug | Freq: Once | INTRAMUSCULAR | Status: DC

## 2020-10-19 MED ORDER — ORAL CARE MOUTH RINSE
15.0000 mL | OROMUCOSAL | Status: DC
  Administered 2020-10-19 – 2020-10-21 (×21): 15 mL via OROMUCOSAL

## 2020-10-19 NOTE — ED Triage Notes (Addendum)
Pt arrived from [facility], pt unresponsive and EMS called by staff EMS reports agonal breathing and states they assisted ventilation with BVM    EMS gave. 2 neb treatments, 125 solu-medrol and 2mg  Magnesium  Recent d/c for COPD exacerbation.    Pt placed on Bi-pap on arrival

## 2020-10-19 NOTE — ED Notes (Signed)
Pt not improving on bi-pap continues to be unresponsive and clammy  Provider notified plan to intubate, RT called and RSI medications pulled

## 2020-10-19 NOTE — ED Provider Notes (Signed)
[facility] EMERGENCY DEPARTMENT Provider Note   CSN: [id number] Arrival date & time: 11/11/2020  1210     History Chief Complaint  Patient presents with   Shortness of Breath   Altered Mental Status    [name] is a 65 y.o. male.  Pt presents to the ED today with AMS.  Pt was admitted last week (9/26-29) for COPD and sent to [facility] for rehab.  He was apparently normal at 1000 and was struggling to breathe at 1100.  Pt was given 1 neb by the facility and EMS was called.  EMS found pt to be unresponsive and required some BVM.  He tolerated an oral airway initially.  After he was bagged for several minutes, he started to wake up some and he was put on a 15 L NRB. Pt was given 20 mg albuterol, 125 mg solumedrol, and 2 g magnesium en route.  Pt is opening his eyes, but is not taking.      Past Medical History:  Diagnosis Date   Anxiety    Arthritis    Chronic narcotic use    Complication of anesthesia    h/o aspiration during back surgery   Depression    Diabetic peripheral neuropathy ([place])    Hearing loss    wears bilateral hearing aids, bilat moderate SNHL   Hyperlipidemia    Hypertension    Insomnia    Tobacco abuse    Type 1 diabetes mellitus (HCC)    Ulcer of left ankle (HCC)    Vision abnormalities    mild DM retinopathy    Patient Active Problem List   Diagnosis Date Noted   Acute respiratory failure with hypoxia ([place]) 10/12/2020   Type 2 DM with CKD and hypertension 10/12/2020   COPD with acute exacerbation ([place]) 10/12/2020   Rib fractures 03/10/2020   Closed fracture of proximal end of left humerus 03/10/2020   Normocytic anemia 03/10/2020   Thrombocytopenia ([place]) 03/10/2020   COPD, severe ([place]) 12/17/2019   Current smoker 12/17/2019   Acute encephalopathy 03/31/2019   AMS (altered mental status) 03/30/2019   Cellulitis of lower extremity 06/01/2018   Cellulitis of both lower extremities 05/31/2018   Chronic respiratory failure with  hypoxia ([place]) 05/31/2018   HLD (hyperlipidemia) 05/31/2018   Acute renal failure superimposed on stage 3b chronic kidney disease ([place]) 05/31/2018   Depression with anxiety 05/31/2018   Chronic pain 05/31/2018   Acute respiratory failure with hypoxia and hypercapnia ([place]) 04/14/2018   Diabetes mellitus type 1 with manifestations ([place]) 04/14/2018   Postop check 10/31/2017   Central sleep apnea 10/14/2017   Insomnia secondary to chronic pain 08/01/2017   Contact with and (suspected) exposure to environmental tobacco smoke (acute) (chronic) 08/01/2017   Snoring 08/01/2017   Venous insufficiency (chronic) (peripheral) 10/04/2016   Essential hypertension 08/01/2013   Chronic ulcer of left ankle ([place]) 08/01/2013   Type I (juvenile type) diabetes mellitus with neurological manifestations, not stated as uncontrolled(250.61) 10/03/2012   Healthcare-associated pneumonia 10/03/2012   Dyslipidemia 10/03/2012   Insomnia 10/03/2012   Pain in joint, lower leg 10/02/2012   Special screening for malignant neoplasms, colon 07/26/2010    Past Surgical History:  Procedure Laterality Date   COCHLEAR IMPLANT Left 10/31/2017   Procedure: COCHLEAR IMPLANT LEFT EAR;  Surgeon: Vicie Mutters, MD;  Location: [facility];  Service: ENT;  Laterality: Left;   COLONOSCOPY WITH PROPOFOL  07-26-2010   [place] had 16  tons of steel rolled over his feet   LUMBAR LAMINECTOMY/DECOMPRESSION MICRODISCECTOMY  12-23-1997   LEFT  L5 -- S1   TONSILLECTOMY         Family History  Problem Relation Age of Onset   Prostate cancer Paternal Uncle    Prostate cancer Maternal Grandfather    Diabetes Mother    Hypertension Mother    Diabetes Father    Heart failure Father     Social History   Tobacco Use   Smoking status: Former    Packs/day: 1.00    Years: 40.00    Pack years: 40.00    Types: Cigarettes    Quit date: 02/16/2018    Years since quitting: 2.6   Smokeless tobacco: Never    Tobacco comments:    8 ciggs daily-03/01/20-AH  Vaping Use   Vaping Use: Never used  Substance Use Topics   Alcohol use: No    Alcohol/week: 0.0 standard drinks   Drug use: No    Home Medications Prior to Admission medications   Medication Sig Start Date End Date Taking? Authorizing Provider  acetaminophen (TYLENOL) 500 MG tablet Take 1,000 mg by mouth in the morning and at bedtime.   Yes [provider]  albuterol (PROVENTIL) (2.5 MG/3ML) 0.083% nebulizer solution Take 3 mLs (2.5 mg total) by nebulization every 6 (six) hours as needed for wheezing or shortness of breath. 12/16/19  Yes Martyn Ehrich, NP  amLODipine (NORVASC) 10 MG tablet Take 10 mg by mouth daily.   Yes [provider]  Apoaequorin (PREVAGEN PO) Take 1 capsule by mouth daily.   Yes [provider]  aspirin EC 81 MG tablet Take 81 mg by mouth daily.   Yes [provider]  benzonatate (TESSALON) 100 MG capsule Take 100 mg by mouth 3 (three) times daily as needed for cough.   Yes [provider]  divalproex (DEPAKOTE) 250 MG DR tablet Take 250 mg by mouth daily.   Yes [provider]  doxepin (SINEQUAN) 10 MG capsule Take 10 mg by mouth at bedtime. 09/24/20  Yes [provider]  Fluticasone-Umeclidin-Vilant (TRELEGY ELLIPTA) 100-62.5-25 MCG/INH AEPB Inhale 1 puff into the lungs daily. 01/20/20  Yes Rigoberto Noel, MD  gabapentin (NEURONTIN) 300 MG capsule Take 300 mg by mouth 2 (two) times daily.   Yes [provider]  Insulin Glargine (BASAGLAR KWIKPEN) 100 UNIT/ML Inject 5 Units into the skin daily. Patient taking differently: Inject 5-7 Units into the skin 2 (two) times daily. 5 units in the morning and 7 units at bedtime 03/15/20  Yes Alma Friendly, MD  insulin lispro (HUMALOG) 100 UNIT/ML injection Inject 2-12 Units into the skin 4 (four) times daily -  with meals and at bedtime. Per sliding scale   Yes [provider]  losartan  (COZAAR) 50 MG tablet Take 1 tablet (50 mg total) by mouth daily. 03/15/20  Yes Alma Friendly, MD  metoprolol tartrate (LOPRESSOR) 25 MG tablet Take 0.5 tablets (12.5 mg total) by mouth 2 (two) times daily. Take 2 tablets (50 mg) by mouth every morning and 1 tablet (25 mg) at bedtime Patient taking differently: Take 25 mg by mouth 2 (two) times daily. 03/15/20  Yes Alma Friendly, MD  MS CONTIN 15 MG 12 hr tablet Take 1 tablet (15 mg total) by mouth every 8 (eight) hours. 10/14/20  Yes Gherghe, Vella Redhead, MD  ondansetron (ZOFRAN-ODT) 4 MG disintegrating tablet Take 4 mg by mouth every 6 (six)  hours as needed for nausea.   Yes [provider]  predniSONE (DELTASONE) 10 MG tablet Take 4 tablets (40 mg total) by mouth daily. 4 tablets daily x 3 days then 3 daily x 3 days then 2 daily x 3 days then 1 daily x 3 days Patient taking differently: Take 30 mg by mouth daily. For 3 days (10.3.22 to 10.5.22) 10/13/20  Yes Gherghe, Vella Redhead, MD  sertraline (ZOLOFT) 50 MG tablet Take 50 mg by mouth daily.   Yes [provider]  simvastatin (ZOCOR) 20 MG tablet Take 20 mg by mouth at bedtime.   Yes [provider]  vitamin B-12 1000 MCG tablet Take 1 tablet (1,000 mcg total) by mouth daily. 03/16/20  Yes Alma Friendly, MD  OXYGEN Inhale 3.5 L into the lungs continuous.    [provider]    Allergies    Codeine, Tetracyclines & related, and Venlafaxine  Review of Systems   Review of Systems  Unable to perform ROS: Severe respiratory distress  All other systems reviewed and are negative.  Physical Exam Updated Vital Signs BP (!) 133/50   Pulse 81   Temp (!) 96.9 F (36.1 C) (Rectal)   Resp 16   SpO2 (!) 86%   Physical Exam Vitals and nursing note reviewed.  Constitutional:      General: He is in acute distress.     Appearance: He is ill-appearing.  HENT:     Head: Normocephalic and atraumatic.     Mouth/Throat:     Mouth: Mucous membranes are  dry.  Eyes:     Extraocular Movements: Extraocular movements intact.     Pupils: Pupils are equal, round, and reactive to light.  Cardiovascular:     Rate and Rhythm: Normal rate and regular rhythm.  Pulmonary:     Effort: Bradypnea and respiratory distress present.  Abdominal:     General: Bowel sounds are normal.     Palpations: Abdomen is soft.  Musculoskeletal:        General: Normal range of motion.     Cervical back: Normal range of motion and neck supple.  Skin:    General: Skin is warm.     Capillary Refill: Capillary refill takes less than 2 seconds.  Neurological:     Comments: Pt not following commands.  He will open his eyes, but that is it.  Psychiatric:        Mood and Affect: Mood normal.        Behavior: Behavior normal.    ED Results / Procedures / Treatments   Labs (all labs ordered are listed, but only abnormal results are displayed) Labs Reviewed  CBC WITH DIFFERENTIAL/PLATELET - Abnormal; Notable for the following components:      Result Value   WBC 26.1 (*)    All other components within normal limits  I-STAT ARTERIAL BLOOD GAS, ED - Abnormal; Notable for the following components:   pH, Arterial 7.239 (*)    pCO2 arterial 78.7 (*)    pO2, Arterial 74 (*)    Bicarbonate 33.7 (*)    TCO2 36 (*)    Acid-Base Excess 4.0 (*)    All other components within normal limits  I-STAT CHEM 8, ED - Abnormal; Notable for the following components:   Sodium 134 (*)    BUN 37 (*)    Creatinine, Ser 1.40 (*)    Glucose, Bld 370 (*)    Calcium, Ion 0.82 (*)    Hemoglobin 12.9 (*)  HCT 38.0 (*)    All other components within normal limits  RESP PANEL BY RT-PCR (FLU A&B, COVID) ARPGX2  BASIC METABOLIC PANEL  BRAIN NATRIURETIC PEPTIDE  VALPROIC ACID LEVEL  TROPONIN I (HIGH SENSITIVITY)  TROPONIN I (HIGH SENSITIVITY)    EKG None  Radiology DG Chest Portable 1 View  Result Date: 11/13/2020 CLINICAL DATA:  Intubation EXAM: PORTABLE CHEST 1 VIEW COMPARISON:   11/05/2020 FINDINGS: Patient is rotated. Interval placement of endotracheal tube with distal tip terminating approximately 3.7 cm above the carina. Heart size is normal. No focal airspace consolidation, pleural effusion, or pneumothorax. Chronic posttraumatic deformity of the proximal left humerus. IMPRESSION: Endotracheal tube appropriately positioned. No acute cardiopulmonary findings. Electronically Signed   By: Davina Poke D.O.   On: 11/10/2020 13:28   DG Chest Port 1 View  Result Date: 11/05/2020 CLINICAL DATA:  Short of breath. EXAM: PORTABLE CHEST 1 VIEW COMPARISON:  10/11/2020 and older exams. FINDINGS: Cardiac silhouette is normal in size. No mediastinal or hilar masses. Lungs are clear.  No pleural effusion or pneumothorax. Skeletal structures are grossly intact. IMPRESSION: 1. No acute cardiopulmonary disease. Electronically Signed   By: Lajean Manes M.D.   On: 10/26/2020 12:48    Procedures Procedure Name: Intubation Date/Time: 11/06/2020 1:49 PM Performed by: Isla Pence, MD Ventilation: Mask ventilation without difficulty Laryngoscope Size: Glidescope and 3 Tube size: 7.5 mm Number of attempts: 1 Placement Confirmation: ETT inserted through vocal cords under direct vision, Positive ETCO2 and Breath sounds checked- equal and bilateral Secured at: 24 cm Tube secured with: ETT holder Dental Injury: Teeth and Oropharynx as per pre-operative assessment       Medications Ordered in ED Medications  propofol (DIPRIVAN) 1000 MG/100ML infusion (20 mcg/kg/min  61.4 kg Intravenous New Bag/Given 11/15/2020 1309)  docusate (COLACE) 50 MG/5ML liquid 100 mg (100 mg Per Tube Not Given 11/02/2020 1315)  polyethylene glycol (MIRALAX / GLYCOLAX) packet 17 g (has no administration in time range)  fentaNYL (SUBLIMAZE) injection 25 mcg (has no administration in time range)  fentaNYL 2561mcg in NS 266mL (65mcg/ml) infusion-PREMIX (50 mcg/hr Intravenous New Bag/Given 11/08/2020 1315)  fentaNYL  (SUBLIMAZE) bolus via infusion 25-100 mcg (25 mcg Intravenous Bolus from Bag 10/22/2020 1315)  etomidate (AMIDATE) injection (20 mg Intravenous Given 11/08/2020 1243)  succinylcholine (ANECTINE) injection (100 mg Intravenous Given 10/23/2020 1243)  atropine injection (1 mg Intravenous Given 10/20/2020 1257)    ED Course  I have reviewed the triage vital signs and the nursing notes.  Pertinent labs & imaging results that were available during my care of the patient were reviewed by me and considered in my medical decision making (see chart for details).    MDM Rules/Calculators/A&P                           We did a trial of bipap, but ms worsened.  Ph 7.2 and pco2 78 on ABG after he had been on bipap for a little while.  Due to this, pt is intubated.  Pt's HR did drop briefly (30s and 40s) after intubation and he required 1 mg atropine.  HR went to the 70s.  Pt's bp also dropped after intubation.  Pt given 2L NS and bp did respond and is now normal.  Pt now on propofol and fentanyl for comfort while intubated.  Pt's wife called and updated.  Pt is Covid negative.  CCM consulted for admission.  TAARIQ LEITZ was evaluated in Emergency  Department on 11/01/2020 for the symptoms described in the history of present illness. He was evaluated in the context of the global COVID-19 pandemic, which necessitated consideration that the patient might be at risk for infection with the SARS-CoV-2 virus that causes COVID-19. Institutional protocols and algorithms that pertain to the evaluation of patients at risk for COVID-19 are in a state of rapid change based on information released by regulatory bodies including the CDC and federal and state organizations. These policies and algorithms were followed during the patient's care in the ED.   CRITICAL CARE Performed by: Isla Pence   Total critical care time: 45 minutes  Critical care time was exclusive of separately billable procedures and treating other  patients.  Critical care was necessary to treat or prevent imminent or life-threatening deterioration.  Critical care was time spent personally by me on the following activities: development of treatment plan with patient and/or surrogate as well as nursing, discussions with consultants, evaluation of patient's response to treatment, examination of patient, obtaining history from patient or surrogate, ordering and performing treatments and interventions, ordering and review of laboratory studies, ordering and review of radiographic studies, pulse oximetry and re-evaluation of patient's condition.    Final Clinical Impression(s) / ED Diagnoses Final diagnoses:  Acute respiratory failure with hypercapnia ([place])  COPD exacerbation ([place])    Rx / DC Orders ED Discharge Orders     None        Isla Pence, MD 10/31/2020 1401

## 2020-10-19 NOTE — Progress Notes (Signed)
Assisted MD with intubation.  ETCO2 good color change, BBS equal.  Placed on Vent. Pt SPO2 100%, ABG to follow
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
## 2020-10-19 NOTE — ED Notes (Addendum)
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 Wife will place in case in belongings bag. Advises that pt is also diabetic, wearing freestyle libre. States she is caring for a toddler, able to come MWF 9a-1p. Name & number updated in chart, states that daughter, Maple Mirza [phone]

## 2020-10-20 ENCOUNTER — Inpatient Hospital Stay (HOSPITAL_COMMUNITY): Payer: Medicare Other

## 2020-10-20 DIAGNOSIS — R0603 Acute respiratory distress: Secondary | ICD-10-CM

## 2020-10-20 DIAGNOSIS — J9602 Acute respiratory failure with hypercapnia: Secondary | ICD-10-CM

## 2020-10-20 LAB — DIFFERENTIAL
Abs Immature Granulocytes: 0.3 10*3/uL — ABNORMAL HIGH (ref 0.00–0.07)
Basophils Absolute: 0.1 10*3/uL (ref 0.0–0.1)
Basophils Relative: 0 %
Eosinophils Absolute: 0 10*3/uL (ref 0.0–0.5)
Eosinophils Relative: 0 %
Immature Granulocytes: 1 %
Lymphocytes Relative: 5 %
Lymphs Abs: 1.3 10*3/uL (ref 0.7–4.0)
Monocytes Absolute: 1.5 10*3/uL — ABNORMAL HIGH (ref 0.1–1.0)
Monocytes Relative: 6 %
Neutro Abs: 22.4 10*3/uL — ABNORMAL HIGH (ref 1.7–7.7)
Neutrophils Relative %: 88 %

## 2020-10-20 LAB — ECHOCARDIOGRAM COMPLETE
Area-P 1/2: 1.98 cm2
Height: 69 in
Weight: 2172.85 oz

## 2020-10-20 LAB — RESPIRATORY PANEL BY PCR

## 2020-10-20 LAB — GLUCOSE, CAPILLARY
Glucose-Capillary: 105 mg/dL — ABNORMAL HIGH (ref 70–99)
Glucose-Capillary: 124 mg/dL — ABNORMAL HIGH (ref 70–99)
Glucose-Capillary: 136 mg/dL — ABNORMAL HIGH (ref 70–99)
Glucose-Capillary: 162 mg/dL — ABNORMAL HIGH (ref 70–99)
Glucose-Capillary: 170 mg/dL — ABNORMAL HIGH (ref 70–99)
Glucose-Capillary: 58 mg/dL — ABNORMAL LOW (ref 70–99)

## 2020-10-20 LAB — POCT I-STAT 7, (LYTES, BLD GAS, ICA,H+H)
Acid-Base Excess: 1 mmol/L (ref 0.0–2.0)
Bicarbonate: 26.2 mmol/L (ref 20.0–28.0)
Calcium, Ion: 1.18 mmol/L (ref 1.15–1.40)
HCT: 31 % — ABNORMAL LOW (ref 39.0–52.0)
Hemoglobin: 10.5 g/dL — ABNORMAL LOW (ref 13.0–17.0)
O2 Saturation: 89 %
Patient temperature: 98.7
Potassium: 3.7 mmol/L (ref 3.5–5.1)
Sodium: 141 mmol/L (ref 135–145)
TCO2: 28 mmol/L (ref 22–32)
pCO2 arterial: 46.1 mmHg (ref 32.0–48.0)
pH, Arterial: 7.364 (ref 7.350–7.450)
pO2, Arterial: 60 mmHg — ABNORMAL LOW (ref 83.0–108.0)

## 2020-10-20 LAB — CBC
HCT: 36 % — ABNORMAL LOW (ref 39.0–52.0)
Hemoglobin: 11.7 g/dL — ABNORMAL LOW (ref 13.0–17.0)
MCH: 28.8 pg (ref 26.0–34.0)
MCHC: 32.5 g/dL (ref 30.0–36.0)
MCV: 88.7 fL (ref 80.0–100.0)
Platelets: 204 10*3/uL (ref 150–400)
RBC: 4.06 MIL/uL — ABNORMAL LOW (ref 4.22–5.81)
RDW: 13.2 % (ref 11.5–15.5)
WBC: 25.4 10*3/uL — ABNORMAL HIGH (ref 4.0–10.5)
nRBC: 0 % (ref 0.0–0.2)

## 2020-10-20 LAB — HEPATIC FUNCTION PANEL
ALT: 19 U/L (ref 0–44)
AST: 15 U/L (ref 15–41)
Albumin: 2.4 g/dL — ABNORMAL LOW (ref 3.5–5.0)
Alkaline Phosphatase: 54 U/L (ref 38–126)
Bilirubin, Direct: <0.1 mg/dL (ref 0.0–0.2)
Total Bilirubin: 0.3 mg/dL (ref 0.3–1.2)
Total Protein: 5 g/dL — ABNORMAL LOW (ref 6.5–8.1)

## 2020-10-20 LAB — TROPONIN I (HIGH SENSITIVITY): Troponin I (High Sensitivity): 42 ng/L — ABNORMAL HIGH (ref <18)

## 2020-10-20 LAB — LACTIC ACID, PLASMA
Lactic Acid, Venous: 3.9 mmol/L (ref 0.5–1.9)
Lactic Acid, Venous: 3.4 mmol/L (ref 0.5–1.9)
Lactic Acid, Venous: 3 mmol/L (ref 0.5–1.9)

## 2020-10-20 LAB — MAGNESIUM
Magnesium: 1.9 mg/dL (ref 1.7–2.4)
Magnesium: 2.4 mg/dL (ref 1.7–2.4)

## 2020-10-20 LAB — PROCALCITONIN: Procalcitonin: 3.3 ng/mL

## 2020-10-20 LAB — PHOSPHORUS: Phosphorus: 4.8 mg/dL — ABNORMAL HIGH (ref 2.5–4.6)

## 2020-10-20 MED ORDER — LACTATED RINGERS IV BOLUS
500.0000 mL | Freq: Once | INTRAVENOUS | Status: AC
  Administered 2020-10-20: 500 mL via INTRAVENOUS

## 2020-10-20 MED ORDER — PERFLUTREN LIPID MICROSPHERE
1.0000 mL | INTRAVENOUS | Status: AC | PRN
  Administered 2020-10-20: 4 mL via INTRAVENOUS
  Filled 2020-10-20: qty 10

## 2020-10-20 MED ORDER — DEXTROSE 50 % IV SOLN
INTRAVENOUS | Status: AC
  Administered 2020-10-20: 25 mL
  Filled 2020-10-20: qty 50

## 2020-10-20 MED ORDER — MAGNESIUM SULFATE 2 GM/50ML IV SOLN
2.0000 g | Freq: Once | INTRAVENOUS | Status: AC
  Administered 2020-10-20: 2 g via INTRAVENOUS
  Filled 2020-10-20: qty 50

## 2020-10-20 MED ORDER — PROSOURCE TF PO LIQD
45.0000 mL | Freq: Every day | ORAL | Status: DC
  Filled 2020-10-20: qty 45

## 2020-10-20 MED ORDER — ADULT MULTIVITAMIN W/MINERALS CH
1.0000 | ORAL_TABLET | Freq: Every day | ORAL | Status: DC
  Administered 2020-10-20 – 2020-10-27 (×6): 1
  Filled 2020-10-20 (×6): qty 1

## 2020-10-20 MED ORDER — VITAL AF 1.2 CAL PO LIQD
1000.0000 mL | ORAL | Status: DC
  Administered 2020-10-20: 1000 mL

## 2020-10-20 MED ORDER — SODIUM CHLORIDE 0.9 % IV SOLN
2.0000 g | Freq: Two times a day (BID) | INTRAVENOUS | Status: DC
  Administered 2020-10-20 – 2020-10-22 (×5): 2 g via INTRAVENOUS
  Filled 2020-10-20 (×5): qty 2

## 2020-10-20 NOTE — Progress Notes (Signed)
[facility] Progress Note Patient Name: [name] DOB: 04/11/1955 MRN: [id number]   Date of Service  10/20/2020  HPI/Events of Note  Patient with a serum lactic acid of 3.9 and oliguria (no urine output since admission almost 24 hours ago, and only 305 ml of urine in the bladder on bladder scanning. Bedside RN also reports that chest is clear to auscultation.  eICU Interventions  LR 500 ml iv fluid bolus ordered.        Kerry Kass Maurilio Puryear 10/20/2020, 6:27 AM

## 2020-10-20 NOTE — Progress Notes (Signed)
Echocardiogram 2D Echocardiogram has been performed.  Oneal Deputy Renna Kilmer RDCS 10/20/2020, 3:23 PM

## 2020-10-20 NOTE — Progress Notes (Addendum)
Initial Nutrition Assessment  DOCUMENTATION CODES:   Severe malnutrition in context of chronic illness  INTERVENTION:   Tube feeding via OG tube: Vital AF 1.2 at 35 ml/h, increase by 10 ml every 12 hours to goal rate of 55 ml/h (1320 ml per day) Prosource TF 45 ml once daily  At goal rate, TF will provide 1624 kcal (1917 kcal total with current propofol rate), 110 gm protein, 1071 ml free water daily.  Change liquid MVI to MVI with minerals 1 tablet daily, crushed and given via tube.  Monitor magnesium, potassium, and phosphorus BID for at least 3 days, MD to replete as needed, as pt is at risk for refeeding syndrome given severe malnutrition with drop in K, Mag, and Phos levels since TF started yesterday.  NUTRITION DIAGNOSIS:   Severe Malnutrition related to chronic illness (COPD) as evidenced by severe muscle depletion, severe fat depletion.  GOAL:   Patient will meet greater than or equal to 90% of their needs  MONITOR:   Vent status, Labs, TF tolerance, Skin  REASON FOR ASSESSMENT:   Ventilator, Consult Enteral/tube feeding initiation and management  ASSESSMENT:   65 yo male admitted with acute on chronic respiratory failure after being found down at his rehab facility. PMH includes severe COPD, active smoker, chronic respiratory failure, asthma, anxiety, arthritis, HLD, HTN, left ankle ulcer, chronic narcotic use, DM type 1, depression, hearing loss, cochlear implant, DM retinopathy.  Discussed patient in ICU rounds and with RN today. Patient is currently requiring propofol and fentanyl.  OG tube in place. Currently receiving Vital High Protein at 40 ml/h with Prosource TF 45 ml BID to provide 1040 kcal, 106 gm protein, 803 ml free water daily.   Patient is currently intubated on ventilator support MV: 10.1 L/min Temp (24hrs), Avg:97.3 F (36.3 C), Min:93 F (33.9 C), Max:99.1 F (37.3 C)  Propofol: 11.1 ml/hr providing 293 kcal from lipid  Total intake with  propofol and TF is 1333 kcal.  Labs reviewed. Phos 4.8 (H), K 3.7 WNL, Mag 1.9 WNL Phos, Mag, and K levels have dropped since yesterday. Will need to continue to monitor and replete if they drop below desired range.  CBG: 105-124  Medications reviewed and include Mag sulfate, Depakote, Colace, Novolog SSI, Levemir, Solumedrol, liquid MVI, Protonix, Miralax, Propofol.  Weight history reviewed. Patient with 6% weight loss within the past 6 months, which is concerning, but not significant for the time frame.   NUTRITION - FOCUSED PHYSICAL EXAM:  Flowsheet Row Most Recent Value  Orbital Region Severe depletion  Upper Arm Region Moderate depletion  Thoracic and Lumbar Region Severe depletion  Buccal Region Unable to assess  Temple Region Severe depletion  Clavicle Bone Region Severe depletion  Clavicle and Acromion Bone Region Severe depletion  Scapular Bone Region Severe depletion  Dorsal Hand Mild depletion  Patellar Region Severe depletion  Anterior Thigh Region Severe depletion  Posterior Calf Region Severe depletion  Edema (RD Assessment) Mild  Hair Reviewed  Eyes Unable to assess  Mouth Unable to assess  Skin Reviewed  Nails Reviewed       Diet Order:   Diet Order             Diet NPO time specified  Diet effective now                   EDUCATION NEEDS:   Not appropriate for education at this time  Skin:  Skin Assessment: Skin Integrity Issues: Skin Integrity Issues:: DTI  DTI: sacrum  Last BM:  no BM documented  Height:   Ht Readings from Last 1 Encounters:  11/01/2020 5\' 9"  (1.753 m)    Weight:   Wt Readings from Last 1 Encounters:  10/20/20 61.6 kg    BMI:  Body mass index is 20.05 kg/m.  Estimated Nutritional Needs:   Kcal:  1630  Protein:  95-115 gm  Fluid:  >/= 1.85 L    Lucas Mallow, RD, LDN, CNSC Please refer to Amion for contact information.

## 2020-10-20 NOTE — TOC Progression Note (Signed)
Transition of Care [facility]) - Initial/Assessment Note    Patient Details  Name: [name] MRN: [id number] Date of Birth: 1955/01/28  Transition of [facility]) CM/SW Contact:    Milinda Antis, [place] Phone Number: 10/20/2020, 10:55 AM  Clinical Narrative:                  CSW following patient for any d/c planning needs once medically stable.  CSW received information that the patient is from [facility].  CSW attempted to call [facility] in admissions at the facility to verify whether the patient is at the facility for LTC.  There was no answer.  CSW left a VM requesting a returned call.   Lind Covert, MSW, LCSWA      Patient Goals and CMS Choice        Expected Discharge Plan and Services                                                Prior Living Arrangements/Services                       Activities of Daily Living      Permission Sought/Granted                  Emotional Assessment              Admission diagnosis:  COPD exacerbation ([place]) [J44.1] Acute respiratory failure with hypercapnia ([place]) [J96.02] Acute on chronic respiratory failure with hypercapnia (HCC) [J96.22] Patient Active Problem List   Diagnosis Date Noted   Acute on chronic respiratory failure with hypercapnia ([place]) 10/24/2020   Acute respiratory failure with hypoxia ([place]) 10/12/2020   Type 2 DM with CKD and hypertension 10/12/2020   COPD exacerbation ([place]) 10/12/2020   Rib fractures 03/10/2020   Closed fracture of proximal end of left humerus 03/10/2020   Normocytic anemia 03/10/2020   Thrombocytopenia ([place]) 03/10/2020   COPD, severe ([place]) 12/17/2019   Current smoker 12/17/2019   Acute encephalopathy 03/31/2019   AMS (altered mental status) 03/30/2019   Cellulitis of lower extremity 06/01/2018   Cellulitis of both lower extremities 05/31/2018   Chronic respiratory failure with hypoxia ([place]) 05/31/2018   HLD (hyperlipidemia) 05/31/2018   Acute renal  failure superimposed on stage 3b chronic kidney disease ([place]) 05/31/2018   Depression with anxiety 05/31/2018   Chronic pain 05/31/2018   Acute respiratory failure with hypoxia and hypercapnia ([place]) 04/14/2018   Diabetes mellitus type 1 with manifestations ([place]) 04/14/2018   Postop check 10/31/2017   Central sleep apnea 10/14/2017   Insomnia secondary to chronic pain 08/01/2017   Contact with and (suspected) exposure to environmental tobacco smoke (acute) (chronic) 08/01/2017   Snoring 08/01/2017   Venous insufficiency (chronic) (peripheral) 10/04/2016   Essential hypertension 08/01/2013   Chronic ulcer of left ankle ([place]) 08/01/2013   Type I (juvenile type) diabetes mellitus with neurological manifestations, not stated as uncontrolled(250.61) 10/03/2012   Healthcare-associated pneumonia 10/03/2012   Dyslipidemia 10/03/2012   Insomnia 10/03/2012   Pain in joint, lower leg 10/02/2012   Special screening for malignant neoplasms, colon 07/26/2010   PCP:  Leanna Battles, MD Pharmacy:   [facility] [id number], [place] [address] Phone: [phone] Fax: [phone]     Social Determinants of  Health (SDOH) Interventions    Readmission Risk Interventions No flowsheet data found.

## 2020-10-20 NOTE — Progress Notes (Signed)
   10/20/20 0757  Airway 7.5 mm  Placement Date/Time: 10/18/2020 1245   Placed By: ED Physician  Airway Device: Endotracheal Tube  Laryngoscope Blade: 3  ETT Types: Oral  Size (mm): 7.5 mm  Cuffed: Cuffed  Insertion attempts: 1  Airway Equipment: Stylet;Fiberoptic Bronchoscope  Placeme...  Secured at (cm) 26 cm  Measured From Teeth  Secured Location Left  Secured By Actuary Repositioned Yes  Prone position No  Cuff Pressure (cm H2O) Clear OR 27-39 CmH2O  Site Condition Dry  Adult Ventilator Settings  Vent Type Servo i  Humidity HME  Vent Mode PRVC  Vt Set 570 mL  Set Rate 18 bmp  FiO2 (%) 40 %  PEEP 5 cmH20  Adult Ventilator Measurements  Peak Airway Pressure 21 L/min  Mean Airway Pressure 9 cmH20  Plateau Pressure 16 cmH20  Resp Rate Total 18 br/min  Exhaled Vt 559 mL  Measured Ve 10.2 mL  I:E Ratio Measured 1:3.1  Auto PEEP 1 cmH20  Total PEEP 6 cmH20  SpO2 95 %  Adult Ventilator Alarms  Alarms On Y  Ve High Alarm 18 L/min  Ve Low Alarm 4 L/min  Resp Rate High Alarm 35 br/min  Resp Rate Low Alarm 10  PEEP Low Alarm 3 cmH2O  Press High Alarm 40 cmH2O  VAP Prevention  HME changed No  Ventilator changed No  Transported while on vent No  HOB> 30 Degrees Y  Daily Weaning Assessment  Daily Assessment of Readiness to Wean Wean protocol criteria met (SBT performed)  SBT Method CPAP 5 cm H20 and PS 5 cm H20  Weaning Start Time 0715  Patient response Failed SBT terminated  Reason SBT Terminated Excessive agitation, marked accessory muscle use, abdominal paradox, or diaphoresis noted;HR > 130 beats/min or sustained change in HR of 20%;SpO2 < 92% with O2 adjustment per protocol  Breath Sounds  Bilateral Breath Sounds Clear;Diminished  Airway Suctioning/Secretions  Suction Type ETT  Suction Device  Catheter  Secretion Amount Small  Secretion Color Tan  Secretion Consistency Thick  Suction Tolerance Tolerated well  Suctioning Adverse Effects  None
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
## 2020-10-21 ENCOUNTER — Inpatient Hospital Stay (HOSPITAL_COMMUNITY): Payer: Medicare Other

## 2020-10-21 DIAGNOSIS — J9622 Acute and chronic respiratory failure with hypercapnia: Secondary | ICD-10-CM | POA: Diagnosis not present

## 2020-10-21 DIAGNOSIS — E43 Unspecified severe protein-calorie malnutrition: Secondary | ICD-10-CM | POA: Insufficient documentation

## 2020-10-21 LAB — BASIC METABOLIC PANEL
Anion gap: 18 — ABNORMAL HIGH (ref 5–15)
Anion gap: 12 (ref 5–15)
BUN: 55 mg/dL — ABNORMAL HIGH (ref 8–23)
BUN: 50 mg/dL — ABNORMAL HIGH (ref 8–23)
CO2: 16 mmol/L — ABNORMAL LOW (ref 22–32)
CO2: 25 mmol/L (ref 22–32)
Calcium: 8.3 mg/dL — ABNORMAL LOW (ref 8.9–10.3)
Calcium: 8.6 mg/dL — ABNORMAL LOW (ref 8.9–10.3)
Chloride: 106 mmol/L (ref 98–111)
Chloride: 101 mmol/L (ref 98–111)
Creatinine, Ser: 1.86 mg/dL — ABNORMAL HIGH (ref 0.61–1.24)
Creatinine, Ser: 1.7 mg/dL — ABNORMAL HIGH (ref 0.61–1.24)
GFR, Estimated: 39 mL/min — ABNORMAL LOW (ref >60)
GFR, Estimated: 44 mL/min — ABNORMAL LOW (ref >60)
Glucose, Bld: 72 mg/dL (ref 70–99)
Glucose, Bld: 113 mg/dL — ABNORMAL HIGH (ref 70–99)
Potassium: 5.3 mmol/L — ABNORMAL HIGH (ref 3.5–5.1)
Potassium: 4.4 mmol/L (ref 3.5–5.1)
Sodium: 140 mmol/L (ref 135–145)
Sodium: 138 mmol/L (ref 135–145)

## 2020-10-21 LAB — POCT I-STAT 7, (LYTES, BLD GAS, ICA,H+H)
Acid-Base Excess: 1 mmol/L (ref 0.0–2.0)
Bicarbonate: 26.6 mmol/L (ref 20.0–28.0)
Calcium, Ion: 1.2 mmol/L (ref 1.15–1.40)
HCT: 29 % — ABNORMAL LOW (ref 39.0–52.0)
Hemoglobin: 9.9 g/dL — ABNORMAL LOW (ref 13.0–17.0)
O2 Saturation: 95 %
Patient temperature: 97.9
Potassium: 4.4 mmol/L (ref 3.5–5.1)
Sodium: 138 mmol/L (ref 135–145)
TCO2: 28 mmol/L (ref 22–32)
pCO2 arterial: 44.2 mmHg (ref 32.0–48.0)
pH, Arterial: 7.386 (ref 7.350–7.450)
pO2, Arterial: 78 mmHg — ABNORMAL LOW (ref 83.0–108.0)

## 2020-10-21 LAB — CBC
HCT: 37.2 % — ABNORMAL LOW (ref 39.0–52.0)
Hemoglobin: 11.9 g/dL — ABNORMAL LOW (ref 13.0–17.0)
MCH: 28.5 pg (ref 26.0–34.0)
MCHC: 32 g/dL (ref 30.0–36.0)
MCV: 89.2 fL (ref 80.0–100.0)
Platelets: 193 10*3/uL (ref 150–400)
RBC: 4.17 MIL/uL — ABNORMAL LOW (ref 4.22–5.81)
RDW: 14.1 % (ref 11.5–15.5)
WBC: 20.5 10*3/uL — ABNORMAL HIGH (ref 4.0–10.5)
nRBC: 0 % (ref 0.0–0.2)

## 2020-10-21 LAB — GLUCOSE, CAPILLARY
Glucose-Capillary: 125 mg/dL — ABNORMAL HIGH (ref 70–99)
Glucose-Capillary: 126 mg/dL — ABNORMAL HIGH (ref 70–99)
Glucose-Capillary: 144 mg/dL — ABNORMAL HIGH (ref 70–99)
Glucose-Capillary: 177 mg/dL — ABNORMAL HIGH (ref 70–99)
Glucose-Capillary: 37 mg/dL — CL (ref 70–99)
Glucose-Capillary: 41 mg/dL — CL (ref 70–99)
Glucose-Capillary: 66 mg/dL — ABNORMAL LOW (ref 70–99)
Glucose-Capillary: 82 mg/dL (ref 70–99)
Glucose-Capillary: 92 mg/dL (ref 70–99)

## 2020-10-21 LAB — MAGNESIUM
Magnesium: 2.5 mg/dL — ABNORMAL HIGH (ref 1.7–2.4)
Magnesium: 2.3 mg/dL (ref 1.7–2.4)

## 2020-10-21 LAB — LACTIC ACID, PLASMA: Lactic Acid, Venous: 2.3 mmol/L (ref 0.5–1.9)

## 2020-10-21 LAB — PHOSPHORUS
Phosphorus: 4.5 mg/dL (ref 2.5–4.6)
Phosphorus: 4.6 mg/dL (ref 2.5–4.6)

## 2020-10-21 MED ORDER — INSULIN DETEMIR 100 UNIT/ML ~~LOC~~ SOLN
18.0000 [IU] | Freq: Two times a day (BID) | SUBCUTANEOUS | Status: DC
  Filled 2020-10-21: qty 0.18

## 2020-10-21 MED ORDER — METOPROLOL TARTRATE 25 MG PO TABS
25.0000 mg | ORAL_TABLET | Freq: Two times a day (BID) | ORAL | Status: DC
  Administered 2020-10-21 – 2020-10-25 (×8): 25 mg
  Filled 2020-10-21 (×9): qty 1

## 2020-10-21 MED ORDER — METOPROLOL TARTRATE 5 MG/5ML IV SOLN
INTRAVENOUS | Status: AC
  Administered 2020-10-21: 5 mg via INTRAVENOUS
  Filled 2020-10-21: qty 5

## 2020-10-21 MED ORDER — SODIUM ZIRCONIUM CYCLOSILICATE 10 G PO PACK: 10.0000 g | PACK | Freq: Once | ORAL | Status: DC

## 2020-10-21 MED ORDER — FENTANYL CITRATE (PF) 100 MCG/2ML IJ SOLN
25.0000 ug | INTRAMUSCULAR | Status: DC | PRN
Start: 2020-10-21 — End: 2020-10-22
  Administered 2020-10-21 – 2020-10-22 (×3): 25 ug via INTRAVENOUS
  Filled 2020-10-21 (×4): qty 2

## 2020-10-21 MED ORDER — DEXTROSE 50 % IV SOLN
INTRAVENOUS | Status: AC
  Administered 2020-10-21: 12.5 g via INTRAVENOUS
  Filled 2020-10-21: qty 50

## 2020-10-21 MED ORDER — DEXTROSE IN LACTATED RINGERS 5 % IV SOLN: INTRAVENOUS | Status: DC

## 2020-10-21 MED ORDER — ASPIRIN 81 MG PO CHEW
81.0000 mg | CHEWABLE_TABLET | Freq: Every day | ORAL | Status: DC
  Administered 2020-10-22 – 2020-10-27 (×6): 81 mg
  Filled 2020-10-21 (×6): qty 1

## 2020-10-21 MED ORDER — DEXTROSE 50 % IV SOLN: 25.0000 g | INTRAVENOUS | Status: AC

## 2020-10-21 MED ORDER — LABETALOL HCL 5 MG/ML IV SOLN: 10.0000 mg | INTRAVENOUS | Status: DC | PRN

## 2020-10-21 MED ORDER — SIMVASTATIN 20 MG PO TABS
20.0000 mg | ORAL_TABLET | Freq: Every day | ORAL | Status: DC
  Administered 2020-10-22 – 2020-10-27 (×6): 20 mg
  Filled 2020-10-21 (×7): qty 1

## 2020-10-21 MED ORDER — SERTRALINE HCL 50 MG PO TABS
50.0000 mg | ORAL_TABLET | Freq: Every day | ORAL | Status: DC
  Administered 2020-10-21 – 2020-10-27 (×7): 50 mg
  Filled 2020-10-21 (×8): qty 1

## 2020-10-21 MED ORDER — DEXMEDETOMIDINE HCL IN NACL 400 MCG/100ML IV SOLN
0.4000 ug/kg/h | INTRAVENOUS | Status: DC
Start: 2020-10-21 — End: 2020-10-24
  Administered 2020-10-21 (×2): 1.2 ug/kg/h via INTRAVENOUS
  Administered 2020-10-21: 0.4 ug/kg/h via INTRAVENOUS
  Administered 2020-10-22: 1.2 ug/kg/h via INTRAVENOUS
  Administered 2020-10-22 (×2): 0.8 ug/kg/h via INTRAVENOUS
  Administered 2020-10-22: 0.9 ug/kg/h via INTRAVENOUS
  Administered 2020-10-23: 1.2 ug/kg/h via INTRAVENOUS
  Filled 2020-10-21 (×3): qty 100
  Filled 2020-10-21: qty 200
  Filled 2020-10-21 (×4): qty 100

## 2020-10-21 MED ORDER — INSULIN ASPART 100 UNIT/ML IJ SOLN
0.0000 [IU] | INTRAMUSCULAR | Status: DC
  Administered 2020-10-21: 1 [IU] via SUBCUTANEOUS
  Administered 2020-10-22 (×3): 2 [IU] via SUBCUTANEOUS
  Administered 2020-10-22 (×2): 3 [IU] via SUBCUTANEOUS
  Administered 2020-10-22 – 2020-10-23 (×3): 2 [IU] via SUBCUTANEOUS
  Administered 2020-10-23: 5 [IU] via SUBCUTANEOUS
  Administered 2020-10-23: 2 [IU] via SUBCUTANEOUS
  Administered 2020-10-23: 3 [IU] via SUBCUTANEOUS
  Administered 2020-10-24 (×3): 2 [IU] via SUBCUTANEOUS
  Administered 2020-10-24 – 2020-10-25 (×5): 1 [IU] via SUBCUTANEOUS
  Administered 2020-10-25 – 2020-10-26 (×3): 2 [IU] via SUBCUTANEOUS
  Administered 2020-10-26: 1 [IU] via SUBCUTANEOUS
  Administered 2020-10-26: 2 [IU] via SUBCUTANEOUS
  Administered 2020-10-26 (×2): 3 [IU] via SUBCUTANEOUS
  Administered 2020-10-26 – 2020-10-27 (×3): 2 [IU] via SUBCUTANEOUS
  Administered 2020-10-27: 3 [IU] via SUBCUTANEOUS

## 2020-10-21 MED ORDER — OXYCODONE HCL 5 MG PO TABS
5.0000 mg | ORAL_TABLET | Freq: Four times a day (QID) | ORAL | Status: DC
  Administered 2020-10-21 – 2020-10-28 (×24): 5 mg
  Filled 2020-10-21 (×25): qty 1

## 2020-10-21 MED ORDER — LABETALOL HCL 5 MG/ML IV SOLN
10.0000 mg | Freq: Once | INTRAVENOUS | Status: DC
  Filled 2020-10-21: qty 4

## 2020-10-21 MED ORDER — ORAL CARE MOUTH RINSE
15.0000 mL | Freq: Two times a day (BID) | OROMUCOSAL | Status: DC
  Administered 2020-10-22: 15 mL via OROMUCOSAL

## 2020-10-21 MED ORDER — LABETALOL HCL 5 MG/ML IV SOLN
10.0000 mg | INTRAVENOUS | Status: DC | PRN
  Administered 2020-10-21 – 2020-10-27 (×7): 10 mg via INTRAVENOUS
  Filled 2020-10-21 (×6): qty 4

## 2020-10-21 MED ORDER — GABAPENTIN 250 MG/5ML PO SOLN
200.0000 mg | Freq: Three times a day (TID) | ORAL | Status: DC
  Administered 2020-10-22 – 2020-10-28 (×18): 200 mg
  Filled 2020-10-21 (×25): qty 4

## 2020-10-21 MED ORDER — AMLODIPINE BESYLATE 10 MG PO TABS
10.0000 mg | ORAL_TABLET | Freq: Every day | ORAL | Status: DC
  Administered 2020-10-21 – 2020-10-27 (×7): 10 mg
  Filled 2020-10-21 (×7): qty 1

## 2020-10-21 MED ORDER — METOPROLOL TARTRATE 5 MG/5ML IV SOLN
5.0000 mg | INTRAVENOUS | Status: DC | PRN
  Administered 2020-10-22: 5 mg via INTRAVENOUS
  Filled 2020-10-21: qty 5

## 2020-10-21 MED ORDER — NICOTINE 21 MG/24HR TD PT24
21.0000 mg | MEDICATED_PATCH | Freq: Every day | TRANSDERMAL | Status: DC
  Administered 2020-10-21 – 2020-10-27 (×7): 21 mg via TRANSDERMAL
  Filled 2020-10-21 (×7): qty 1

## 2020-10-21 MED ORDER — METOPROLOL TARTRATE 5 MG/5ML IV SOLN: 5.0000 mg | Freq: Once | INTRAVENOUS | Status: AC

## 2020-10-21 MED ORDER — FENTANYL CITRATE (PF) 100 MCG/2ML IJ SOLN
50.0000 ug | Freq: Once | INTRAMUSCULAR | Status: AC
  Administered 2020-10-21: 50 ug via INTRAVENOUS
  Filled 2020-10-21: qty 2

## 2020-10-21 MED ORDER — DEXTROSE 50 % IV SOLN
INTRAVENOUS | Status: AC
  Administered 2020-10-21: 25 g via INTRAVENOUS
  Filled 2020-10-21: qty 50

## 2020-10-21 MED ORDER — FUROSEMIDE 10 MG/ML IJ SOLN
40.0000 mg | Freq: Once | INTRAMUSCULAR | Status: AC
  Administered 2020-10-21: 40 mg via INTRAVENOUS
  Filled 2020-10-21: qty 4

## 2020-10-21 MED ORDER — DOXEPIN HCL 10 MG/ML PO CONC
10.0000 mg | Freq: Every day | ORAL | Status: DC
  Administered 2020-10-22 – 2020-10-27 (×6): 10 mg
  Filled 2020-10-21 (×7): qty 1

## 2020-10-21 MED ORDER — HYDRALAZINE HCL 20 MG/ML IJ SOLN
10.0000 mg | INTRAMUSCULAR | Status: DC | PRN
  Administered 2020-10-21 – 2020-10-27 (×7): 10 mg via INTRAVENOUS
  Filled 2020-10-21 (×7): qty 1

## 2020-10-21 MED ORDER — NICARDIPINE HCL IN NACL 20-0.86 MG/200ML-% IV SOLN
3.0000 mg/h | INTRAVENOUS | Status: DC
  Administered 2020-10-21 – 2020-10-22 (×3): 5 mg/h via INTRAVENOUS
  Administered 2020-10-23: 3 mg/h via INTRAVENOUS
  Administered 2020-10-23 – 2020-10-25 (×10): 5 mg/h via INTRAVENOUS
  Administered 2020-10-25: 4 mg/h via INTRAVENOUS
  Administered 2020-10-25: 2 mg/h via INTRAVENOUS
  Filled 2020-10-21 (×18): qty 200

## 2020-10-21 MED ORDER — HYDRALAZINE HCL 20 MG/ML IJ SOLN: 5.0000 mg | INTRAMUSCULAR | Status: DC | PRN

## 2020-10-21 MED ORDER — DEXTROSE 50 % IV SOLN: 12.5000 g | INTRAVENOUS | Status: AC

## 2020-10-21 NOTE — Progress Notes (Addendum)
[facility] Progress Note Patient Name: [name] DOB: 08-12-1955 MRN: [id number]   Date of Service  10/21/2020  HPI/Events of Note  Patient c/o SOB and asks for help. Currently on [place] O2 with sat = 94% and RR = 22. Also on Precedex IV infusion at 1.2 mcg/kg/hour.  eICU Interventions  Plan: Please give PRN Albuterol neb Rx.  Portable CXR STAT. Trial of BiPAP. Will ask PCCM ground team to evaluate the patient at bedside.      Intervention Category Major Interventions: Other:  Lysle Dingwall 10/21/2020, 10:24 PM
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
## 2020-10-21 NOTE — Significant Event (Addendum)
After extubation. Patient noted with expressive aphasia and left upper extremity neglect. Last known normal 10/4 prior to admission. Spoke with Neurology and STAT head CT ordered.   On repeat examination these findings resolved. If occurs again Neurology has recommended spot EEG.

## 2020-10-21 NOTE — Progress Notes (Signed)
Inpatient Diabetes Program Recommendations  AACE/ADA: New Consensus Statement on Inpatient Glycemic Control (2015)  Target Ranges:  Prepandial:   less than 140 mg/dL      Peak postprandial:   less than 180 mg/dL (1-2 hours)      Critically ill patients:  140 - 180 mg/dL   Lab Results  Component Value Date   GLUCAP 92 10/21/2020   HGBA1C 7.4 (H) 10/12/2020    Review of Glycemic Control Results for [name], [name] (MRN [id number]) as of 10/21/2020 10:03  Ref. Range 10/20/2020 08:23 10/20/2020 11:27 10/20/2020 16:32 10/20/2020 19:22 10/20/2020 23:42 10/21/2020 03:13 10/21/2020 07:37  Glucose-Capillary Latest Ref Range: 70 - 99 mg/dL 124 (H) 162 (H) 170 (H) 136 (H) 58 (L) 82 92   Diabetes history: DM2 Outpatient Diabetes medications: Basaglar 5 units as needed Current orders for Inpatient glycemic control: Levemir 20 units bid, Novolog 0-15 units q4 hrs.; Solumedrol 40 mg bid  Inpatient Diabetes Program Recommendations:   Consider: -Decrease Levemir to 18 units bid -Decrease Novolog correction to 0-9 units q 4 hrs.  Thank you, [name]. Alan Bobak, RN, MSN, CDE  Diabetes Coordinator Inpatient Glycemic Control Team Team Pager [phone] (8am-5pm) 10/21/2020 10:11 AM

## 2020-10-21 NOTE — Procedures (Signed)
Extubation Procedure Note  Patient Details:   Name: NERO SAWATZKY DOB: [birth year] MRN: [id number]   Airway Documentation:    Vent end date: 10/21/20 Vent end time: 1015   Evaluation  O2 sats: stable throughout Complications: No apparent complications Patient did tolerate procedure well. Bilateral Breath Sounds: Diminished, Clear   Yes  Pt extubated to HFNC 10L, cuff leak present, no stridor noted, pt tolerating well at this time. RN at beside, RT will continue to monitor.   Geni Bers Davarion Cuffee 10/21/2020, 10:15 AM

## 2020-10-22 ENCOUNTER — Inpatient Hospital Stay (HOSPITAL_COMMUNITY): Payer: Medicare Other

## 2020-10-22 DIAGNOSIS — Z978 Presence of other specified devices: Secondary | ICD-10-CM | POA: Diagnosis not present

## 2020-10-22 DIAGNOSIS — J9622 Acute and chronic respiratory failure with hypercapnia: Secondary | ICD-10-CM

## 2020-10-22 DIAGNOSIS — R0602 Shortness of breath: Secondary | ICD-10-CM

## 2020-10-22 DIAGNOSIS — Z9289 Personal history of other medical treatment: Secondary | ICD-10-CM | POA: Diagnosis not present

## 2020-10-22 DIAGNOSIS — J9602 Acute respiratory failure with hypercapnia: Secondary | ICD-10-CM

## 2020-10-22 DIAGNOSIS — J441 Chronic obstructive pulmonary disease with (acute) exacerbation: Secondary | ICD-10-CM

## 2020-10-22 LAB — MAGNESIUM
Magnesium: 2.1 mg/dL (ref 1.7–2.4)
Magnesium: 2.1 mg/dL (ref 1.7–2.4)

## 2020-10-22 LAB — POCT I-STAT 7, (LYTES, BLD GAS, ICA,H+H)
Acid-Base Excess: 4 mmol/L — ABNORMAL HIGH (ref 0.0–2.0)
Bicarbonate: 26.4 mmol/L (ref 20.0–28.0)
Calcium, Ion: 1.14 mmol/L — ABNORMAL LOW (ref 1.15–1.40)
HCT: 29 % — ABNORMAL LOW (ref 39.0–52.0)
Hemoglobin: 9.9 g/dL — ABNORMAL LOW (ref 13.0–17.0)
O2 Saturation: 94 %
Patient temperature: 98.7
Potassium: 4.5 mmol/L (ref 3.5–5.1)
Sodium: 139 mmol/L (ref 135–145)
TCO2: 27 mmol/L (ref 22–32)
pCO2 arterial: 32.6 mmHg (ref 32.0–48.0)
pH, Arterial: 7.517 — ABNORMAL HIGH (ref 7.350–7.450)
pO2, Arterial: 64 mmHg — ABNORMAL LOW (ref 83.0–108.0)

## 2020-10-22 LAB — CBC
HCT: 32.3 % — ABNORMAL LOW (ref 39.0–52.0)
HCT: 35.6 % — ABNORMAL LOW (ref 39.0–52.0)
Hemoglobin: 10.3 g/dL — ABNORMAL LOW (ref 13.0–17.0)
Hemoglobin: 11.9 g/dL — ABNORMAL LOW (ref 13.0–17.0)
MCH: 28.4 pg (ref 26.0–34.0)
MCH: 29.5 pg (ref 26.0–34.0)
MCHC: 31.9 g/dL (ref 30.0–36.0)
MCHC: 33.4 g/dL (ref 30.0–36.0)
MCV: 89 fL (ref 80.0–100.0)
MCV: 88.1 fL (ref 80.0–100.0)
Platelets: 176 10*3/uL (ref 150–400)
Platelets: 292 10*3/uL (ref 150–400)
RBC: 3.63 MIL/uL — ABNORMAL LOW (ref 4.22–5.81)
RBC: 4.04 MIL/uL — ABNORMAL LOW (ref 4.22–5.81)
RDW: 14 % (ref 11.5–15.5)
RDW: 14.4 % (ref 11.5–15.5)
WBC: 9.4 10*3/uL (ref 4.0–10.5)
WBC: 18.7 10*3/uL — ABNORMAL HIGH (ref 4.0–10.5)
nRBC: 0 % (ref 0.0–0.2)
nRBC: 0 % (ref 0.0–0.2)

## 2020-10-22 LAB — CULTURE, RESPIRATORY W GRAM STAIN
Culture: NORMAL
Special Requests: NORMAL

## 2020-10-22 LAB — URINALYSIS, ROUTINE W REFLEX MICROSCOPIC
Bacteria, UA: NONE SEEN
Bilirubin Urine: NEGATIVE
Glucose, UA: NEGATIVE mg/dL
Ketones, ur: 5 mg/dL — AB
Leukocytes,Ua: NEGATIVE
Nitrite: NEGATIVE
Protein, ur: >=300 mg/dL — AB
Specific Gravity, Urine: 1.017 (ref 1.005–1.030)
pH: 5 (ref 5.0–8.0)

## 2020-10-22 LAB — BASIC METABOLIC PANEL
Anion gap: 12 (ref 5–15)
Anion gap: 14 (ref 5–15)
BUN: 53 mg/dL — ABNORMAL HIGH (ref 8–23)
BUN: 59 mg/dL — ABNORMAL HIGH (ref 8–23)
CO2: 24 mmol/L (ref 22–32)
CO2: 23 mmol/L (ref 22–32)
Calcium: 8.2 mg/dL — ABNORMAL LOW (ref 8.9–10.3)
Calcium: 8.7 mg/dL — ABNORMAL LOW (ref 8.9–10.3)
Chloride: 102 mmol/L (ref 98–111)
Chloride: 101 mmol/L (ref 98–111)
Creatinine, Ser: 1.69 mg/dL — ABNORMAL HIGH (ref 0.61–1.24)
Creatinine, Ser: 1.69 mg/dL — ABNORMAL HIGH (ref 0.61–1.24)
GFR, Estimated: 44 mL/min — ABNORMAL LOW (ref >60)
GFR, Estimated: 44 mL/min — ABNORMAL LOW (ref >60)
Glucose, Bld: 235 mg/dL — ABNORMAL HIGH (ref 70–99)
Glucose, Bld: 196 mg/dL — ABNORMAL HIGH (ref 70–99)
Potassium: 4.8 mmol/L (ref 3.5–5.1)
Potassium: 4.5 mmol/L (ref 3.5–5.1)
Sodium: 138 mmol/L (ref 135–145)
Sodium: 138 mmol/L (ref 135–145)

## 2020-10-22 LAB — TYPE AND SCREEN
ABO/RH(D): A POS
Antibody Screen: NEGATIVE

## 2020-10-22 LAB — BASIC METABOLIC PANEL WITH GFR
Anion gap: 11 (ref 5–15)
BUN: 52 mg/dL — ABNORMAL HIGH (ref 8–23)
CO2: 25 mmol/L (ref 22–32)
Calcium: 8.3 mg/dL — ABNORMAL LOW (ref 8.9–10.3)
Chloride: 103 mmol/L (ref 98–111)
Creatinine, Ser: 1.69 mg/dL — ABNORMAL HIGH (ref 0.61–1.24)
GFR, Estimated: 44 mL/min — ABNORMAL LOW (ref >60)
Glucose, Bld: 203 mg/dL — ABNORMAL HIGH (ref 70–99)
Potassium: 4.5 mmol/L (ref 3.5–5.1)
Sodium: 139 mmol/L (ref 135–145)

## 2020-10-22 LAB — BLOOD GAS, ARTERIAL
Acid-Base Excess: 2.2 mmol/L — ABNORMAL HIGH (ref 0.0–2.0)
Bicarbonate: 25.5 mmol/L (ref 20.0–28.0)
Drawn by: 164
FIO2: 40
O2 Saturation: 90.5 %
Patient temperature: 36.7
pCO2 arterial: 34.4 mmHg (ref 32.0–48.0)
pH, Arterial: 7.482 — ABNORMAL HIGH (ref 7.350–7.450)
pO2, Arterial: 57.1 mmHg — ABNORMAL LOW (ref 83.0–108.0)

## 2020-10-22 LAB — GLUCOSE, CAPILLARY
Glucose-Capillary: 163 mg/dL — ABNORMAL HIGH (ref 70–99)
Glucose-Capillary: 182 mg/dL — ABNORMAL HIGH (ref 70–99)
Glucose-Capillary: 196 mg/dL — ABNORMAL HIGH (ref 70–99)
Glucose-Capillary: 199 mg/dL — ABNORMAL HIGH (ref 70–99)
Glucose-Capillary: 219 mg/dL — ABNORMAL HIGH (ref 70–99)
Glucose-Capillary: 220 mg/dL — ABNORMAL HIGH (ref 70–99)

## 2020-10-22 LAB — SODIUM, URINE, RANDOM: Sodium, Ur: 89 mmol/L

## 2020-10-22 LAB — CREATININE, URINE, RANDOM: Creatinine, Urine: 54.68 mg/dL

## 2020-10-22 LAB — PHOSPHORUS
Phosphorus: 4.8 mg/dL — ABNORMAL HIGH (ref 2.5–4.6)
Phosphorus: 4.4 mg/dL (ref 2.5–4.6)

## 2020-10-22 LAB — AMMONIA: Ammonia: 35 umol/L (ref 9–35)

## 2020-10-22 LAB — MYCOPLASMA PNEUMONIAE ANTIBODY, IGM: Mycoplasma pneumo IgM: <770 U/mL (ref 0–769)

## 2020-10-22 MED ORDER — BISACODYL 5 MG PO TBEC: 5.0000 mg | DELAYED_RELEASE_TABLET | Freq: Once | ORAL | Status: DC

## 2020-10-22 MED ORDER — POLYETHYLENE GLYCOL 3350 17 G PO PACK: 17.0000 g | PACK | Freq: Every day | ORAL | Status: DC

## 2020-10-22 MED ORDER — PROPOFOL 10 MG/ML IV BOLUS
INTRAVENOUS | Status: AC
  Administered 2020-10-22: 50 mg
  Filled 2020-10-22: qty 20

## 2020-10-22 MED ORDER — GLUCERNA 1.2 CAL PO LIQD
1000.0000 mL | ORAL | Status: DC
  Filled 2020-10-22: qty 1000

## 2020-10-22 MED ORDER — HALOPERIDOL LACTATE 5 MG/ML IJ SOLN
INTRAMUSCULAR | Status: AC
  Filled 2020-10-22: qty 1

## 2020-10-22 MED ORDER — BISACODYL 10 MG RE SUPP
10.0000 mg | Freq: Once | RECTAL | Status: AC
  Administered 2020-10-22: 10 mg via RECTAL
  Filled 2020-10-22: qty 1

## 2020-10-22 MED ORDER — BISACODYL 10 MG RE SUPP: 10.0000 mg | Freq: Every day | RECTAL | Status: DC | PRN

## 2020-10-22 MED ORDER — FENTANYL CITRATE (PF) 100 MCG/2ML IJ SOLN: 25.0000 ug | INTRAMUSCULAR | Status: DC | PRN

## 2020-10-22 MED ORDER — SODIUM CHLORIDE 0.9 % IV SOLN
INTRAVENOUS | Status: DC | PRN
  Administered 2020-10-22 – 2020-10-24 (×2): 500 mL via INTRAVENOUS

## 2020-10-22 MED ORDER — SUCCINYLCHOLINE CHLORIDE 200 MG/10ML IV SOSY
PREFILLED_SYRINGE | INTRAVENOUS | Status: AC
  Filled 2020-10-22: qty 10

## 2020-10-22 MED ORDER — SODIUM CHLORIDE 0.9 % IV SOLN
1.0000 g | INTRAVENOUS | Status: DC
  Administered 2020-10-22 – 2020-10-23 (×2): 1 g via INTRAVENOUS
  Filled 2020-10-22 (×2): qty 10

## 2020-10-22 MED ORDER — ORAL CARE MOUTH RINSE
15.0000 mL | OROMUCOSAL | Status: DC
  Administered 2020-10-22 – 2020-10-28 (×57): 15 mL via OROMUCOSAL

## 2020-10-22 MED ORDER — FUROSEMIDE 10 MG/ML IJ SOLN
40.0000 mg | Freq: Once | INTRAMUSCULAR | Status: AC
  Administered 2020-10-22: 40 mg via INTRAVENOUS
  Filled 2020-10-22: qty 4

## 2020-10-22 MED ORDER — VALPROIC ACID 250 MG/5ML PO SOLN
250.0000 mg | Freq: Every day | ORAL | Status: DC
  Administered 2020-10-23 – 2020-10-27 (×5): 250 mg
  Filled 2020-10-22 (×5): qty 5

## 2020-10-22 MED ORDER — FENTANYL CITRATE (PF) 100 MCG/2ML IJ SOLN
INTRAMUSCULAR | Status: AC
  Administered 2020-10-22: 25 ug
  Filled 2020-10-22: qty 2

## 2020-10-22 MED ORDER — ROCURONIUM BROMIDE 10 MG/ML (PF) SYRINGE
PREFILLED_SYRINGE | INTRAVENOUS | Status: AC
  Filled 2020-10-22: qty 10

## 2020-10-22 MED ORDER — DOCUSATE SODIUM 50 MG/5ML PO LIQD: 100.0000 mg | Freq: Two times a day (BID) | ORAL | Status: DC

## 2020-10-22 MED ORDER — METOCLOPRAMIDE HCL 5 MG/ML IJ SOLN
10.0000 mg | Freq: Every day | INTRAMUSCULAR | Status: AC
  Administered 2020-10-22 – 2020-10-23 (×2): 10 mg via INTRAVENOUS
  Filled 2020-10-22 (×2): qty 2

## 2020-10-22 MED ORDER — CHLORHEXIDINE GLUCONATE 0.12% ORAL RINSE (MEDLINE KIT)
15.0000 mL | Freq: Two times a day (BID) | OROMUCOSAL | Status: DC
  Administered 2020-10-22 – 2020-10-28 (×12): 15 mL via OROMUCOSAL

## 2020-10-22 MED ORDER — PHENYLEPHRINE 40 MCG/ML (10ML) SYRINGE FOR IV PUSH (FOR BLOOD PRESSURE SUPPORT)
PREFILLED_SYRINGE | INTRAVENOUS | Status: AC
  Filled 2020-10-22: qty 10

## 2020-10-22 MED ORDER — KETAMINE HCL 50 MG/5ML IJ SOSY
PREFILLED_SYRINGE | INTRAMUSCULAR | Status: AC
  Filled 2020-10-22: qty 5

## 2020-10-22 MED ORDER — ETOMIDATE 2 MG/ML IV SOLN
INTRAVENOUS | Status: AC
  Filled 2020-10-22: qty 20

## 2020-10-22 MED ORDER — MIDAZOLAM HCL 2 MG/2ML IJ SOLN
INTRAMUSCULAR | Status: AC
  Filled 2020-10-22: qty 2

## 2020-10-22 MED ORDER — PANTOPRAZOLE SODIUM 40 MG IV SOLR
40.0000 mg | Freq: Every day | INTRAVENOUS | Status: DC
  Administered 2020-10-22 – 2020-10-27 (×6): 40 mg via INTRAVENOUS
  Filled 2020-10-22 (×6): qty 40

## 2020-10-22 MED ORDER — HALOPERIDOL LACTATE 5 MG/ML IJ SOLN
5.0000 mg | Freq: Once | INTRAMUSCULAR | Status: AC
  Administered 2020-10-22: 5 mg via INTRAVENOUS

## 2020-10-22 MED ORDER — BISACODYL 5 MG PO TBEC: 10.0000 mg | DELAYED_RELEASE_TABLET | Freq: Every day | ORAL | Status: DC | PRN

## 2020-10-22 MED ORDER — FENTANYL 2500MCG IN NS 250ML (10MCG/ML) PREMIX INFUSION
0.0000 ug/h | INTRAVENOUS | Status: DC
  Administered 2020-10-22 – 2020-10-24 (×2): 75 ug/h via INTRAVENOUS
  Filled 2020-10-22 (×2): qty 250

## 2020-10-22 MED ORDER — FENTANYL CITRATE (PF) 100 MCG/2ML IJ SOLN
25.0000 ug | INTRAMUSCULAR | Status: DC | PRN
  Administered 2020-10-22 (×4): 100 ug via INTRAVENOUS
  Filled 2020-10-22 (×3): qty 2

## 2020-10-22 NOTE — Progress Notes (Signed)
NAME:  [name], MRN:  [id number], DOB:  02-Mar-1955, LOS: 3 ADMISSION DATE:  11/01/2020, CONSULTATION DATE:  10/4 REFERRING MD:  Gilford Raid, CHIEF COMPLAINT:  Hypercarbic Respiratory Failure   History of Present Illness:  All information from medical records as patient is intubated and sedated.   65 year old male current every day smoker with history of asthma, severe COPD with chronic hypoxemia on 3.5 L during the day, 3 L at night, type 2 diabetes, CKD stage IIIb, PVD and hypertension presents to the ED 10/4  with AMS.  Pt was admitted 1 week prior to this admission week (9/26-29) for COPD exacerbation and sent to [facility] for rehab on 10/14/2020 with prednisone taper and 3 day course of augementin. He was apparently normal at 1000 on the day of admission, but was noticed to have increased WOB at 1100.  Pt was given 1x  nebulizer treatment by the facility and EMS was called.  When EMS arrived, he was found to be unresponsive and required BVM with oral airway.  After he was bagged for several minutes, he started to wake up some and he was put on a 15 L NRB. Pt was given 20 mg albuterol, 125 mg solumedrol, and 2 g magnesium en route. In the ED he was given a BiPAP trial, which he failed, ( ABG after BiPAP was 7.23, 78.7/74/ bicarb 33.7/ sat of 91%) and patient was intubated in the ED. After intubation, patient became hypotensive and bradycardic. He received atropine 1mg  x 1 and 2L of LR. PCCM have been called to admit to ICU and manage care. On the unit patient remained hypotensive but responded appropriately to additional fluid bolus.   Of note, patient does have a BiPAP machine that he does not wear per his wife.  It was not with him at rehab.   Pt. can not be discharged to rehab without BiPAP in the future.   Pertinent  Medical History    Past Medical History:  Diagnosis Date   Anxiety    Arthritis    Chronic narcotic use    Complication of anesthesia    h/o aspiration during back surgery    Depression    Diabetic peripheral neuropathy (HCC)    Hearing loss    wears bilateral hearing aids, bilat moderate SNHL   Hyperlipidemia    Hypertension    Insomnia    Tobacco abuse    Type 1 diabetes mellitus (HCC)    Ulcer of left ankle (HCC)    Vision abnormalities    mild DM retinopathy     Significant Hospital Events: Including procedures, antibiotic start and stop dates in addition to other pertinent events   Recent Hospitalization  (9/26-29) for COPD exacerbation , discharged to [facility] without BiPAP 11/07/2020 Admission to [place] with AMS, found to be hypercarbic failed BiPAP trial intubated, Ceftriaxone 10/4, azithromycin 10/4  10/20/2020 transitioned to cefepime for HCAP coverage. Failed SBT due to hypoxic, tachypnea, agitation  Extubated 10/6, to 6 L , BiPAP overnight   Interim History / Subjective:  Remains very hypertensive this am. He has received 5 mg Haldol. Labetalol 10 mg and 10 mg  of hydralazine given. He remains on 6 L [place].  Continuously calls out to nursing  Objective   Blood pressure (!) 217/204, pulse 91, temperature 98.3 F (36.8 C), temperature source Oral, resp. rate (!) 28, height 5\' 9"  (1.753 m), weight 64.3 kg, SpO2 94 %.    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  no wheeze/crackles  Cardiovascular: S1, S2, RRR, no mRG , SR- ST per tele Abdomen: soft, NT, ND, active bowel sounds Extremities:  -edema , no obvious deformities Neuro: sedated, calls out, answers questions , follows commands, MAE x 4, A&O x 2-3  Labs reviewed 10/7: Na 138, K 4.8, Cl 102/ CO2 24/ Creatinine 1.69/ BUN 53/ Mag 2.1/ Phos 4.8 WBC 9.4 ( from 20.5)  / HGB 10.3/ Platelets 176 Respiratory Culture grew out Normal Flora  T max 98.3 Net + 5300 cc's , 3445 of this is urine  CT Head 10/21/2020  Limited evaluation due to beam hardening artifact from left cochlear implant. Within this limitation, no acute intracranial process.  Resolved Hospital Problem list     Assessment & Plan:   Acute on Chronic Hypercarbic Respiratory Failure in setting of AECOPD and RLL PNA  -on 3.5L day, 3L HS COPD and asthma Current every day smoker Extubated 10/6 Plan -possible RLL consolidation v atelectasis on CXR this AM.  -Known OSA and has been non adherent with his BIPAP which could also be contributing.  -Will need BiPAP ( that he has at home ) at rehab facility once discharged Plan: Titrate nasal oxygen for sats > 90% BiPAP every night at bedtime without fail Continue cefepime and azithromycin CXR prn  Scheduled Pulmicort and DuoNebs, PRN albuterol, methylpred 40mg  BID  Minimize sedation for RASS of -1. Currently on Precedex  Possible septic shock, in the setting of ?PNA Elevated lactate  Patient experienced hemodynamic collapse s/p intubation in the setting of possible sepsis. Moreover, his lactate could be elevated 2/2 type b lactic acidosis due to receiving multiple doses of beta agonist.  Plan Remains off vasopressors, MAP goal >65 Previous Lactic 3, repeat pending  Follow Culture Data  Trend Lactate Trend WBC and fever curve  T2DM Wears Freestyle Libre at home  Received 18u of ssi ON, on levemir 20u BID Plan: CBG Q4  Continue levemir + SSI  CKD stage IIIb Unclear BL sCr though 1.5-1.7 seems to be where he is typically.  Plan: Monitor I&O Trend BMET daily. AM BMP pending.  Replete electrolytes as  needed  Avoid nephrotoxic medications , maintain renal perfusion  HTN Needs CorTrac to give home meds as he failed swallow eval Plan Will restart Cardene GTT until Cor Trac inserted and home meds can be given.  Restart Home Metoprolol and Norvasc once Cor Trac inserted ( Scheduled for 10/7 )  Continue  Home ASA  Depression  Plan Depakote 250 daily  Restart home Zoloft and Sinequan   Delirium  CT Head 10/6 with no acute process  Plan Haldol x 1 on 10.7 Frequent re-orientation  Lights on during the day and off at bedtime  Glasses on Hearing Aids in   Chronic Pain  Plan Restart home Neurontin to assist with sedation weaning  On home MS Contin 15 mg BID.  Will add scheduled oxycodone to help with weaning of sedation.   Nutrition  Plan TF per dietitian Cor Trac placement 10/7    Abnormal Low Dose CT 05/2020> 4A > suspected infectious/ inflammatory Due for Follow up 08/2020 , not yet done Every day smoker 1- 1.5 per day prior to hospitalization 10/11/2020 40 + pack year smoking history Plan Needs smoking cessation counseling    Best Practice (right click and "Reselect all SmartList Selections" daily)   Diet/type: tubefeeds DVT prophylaxis: prophylactic heparin  GI prophylaxis: PPI Lines: N/A Foley:  Yes, and it is still needed Code Status:  full code Last date of multidisciplinary goals of care discussion : Wife updated at bedside 10/4, with plan of care.  She is caring for a toddler, and she is only able to come to visit MWF 9 am - 1 pm, while the child is in daycare.  [place] , [phone] ( Wife) Daughter Maple Mirza [phone]  Labs   CBC: Recent Labs  Lab 11/06/2020 1236 10/30/2020 1321 10/29/2020 1509 10/20/20 0357 10/20/20 0441 10/21/20 0930 10/21/20 1000 10/22/20 0249  WBC 26.1*  --  14.0*  --  25.4*  --  20.5* 9.4  NEUTROABS 22.8*  --   --   --  22.4*  --   --   --   HGB 14.4   < > 11.7* 10.5* 11.7* 9.9* 11.9* 10.3*  HCT 44.5   < > 36.9*  31.0* 36.0* 29.0* 37.2* 32.3*  MCV 89.2  --  90.9  --  88.7  --  89.2 89.0  PLT 375  --  233  --  204  --  193 176   < > = values in this interval not displayed.    Basic Metabolic Panel: Recent Labs  Lab 10/24/2020 1236 11/09/2020 1321 11/01/2020 1508 10/27/2020 1509 10/21/2020 1651 10/20/20 0357 10/20/20 0441 10/20/20 1955 10/21/20 0450 10/21/20 0930 10/21/20 1000 10/21/20 1627 10/22/20 0249  NA 137 134*   < >  --  138 141  --   --  140 138 138  --  138  K 3.9 5.1   < >  --  4.4 3.7  --   --  5.3* 4.4 4.4  --  4.8  CL 96* 103  --   --  101  --   --   --  106  --  101  --  102  CO2 29  --   --   --  27  --   --   --  16*  --  25  --  24  GLUCOSE 388* 370*  --   --  326*  --   --   --  72  --  113*  --  235*  BUN 33* 37*  --   --  35*  --   --   --  55*  --  50*  --  53*  CREATININE 1.47* 1.40*  --  1.54* 1.63*  --   --   --  1.86*  --  1.70*  --  1.69*  CALCIUM 8.7*  --   --   --  7.9*  --   --   --  8.3*  --  8.6*  --  8.2*  MG  --   --    < > 2.2  --   --  1.9 2.4 2.5*  --   --  2.3 2.1  PHOS  --   --   --  5.6*  --   --  4.8*  --  4.5  --   --  4.6 4.8*   < > = values in this interval not displayed.   GFR: Estimated Creatinine Clearance: 39.1 mL/min (A) (by C-G formula based on SCr of 1.69 mg/dL (H)). Recent Labs  Lab 10/18/2020 1509 10/27/2020 1651 10/20/20 0441 10/20/20 1211 10/20/20 1336 10/20/20 1955 10/21/20 1000 10/22/20 0249  PROCALCITON 0.58  --   --  3.30  --   --   --   --   WBC 14.0*  --  25.4*  --   --   --  20.5* 9.4  LATICACIDVEN  --    < > 3.9*  --  3.4* 3.0* 2.3*  --    < > = values in this interval not displayed.    Liver Function Tests: Recent Labs  Lab 10/20/20 0441  AST 15  ALT 19  ALKPHOS 54  BILITOT 0.3  PROT 5.0*  ALBUMIN 2.4*   No results for input(s): LIPASE, AMYLASE in the last 168 hours. No results for input(s): AMMONIA in the last 168 hours.  ABG    Component Value Date/Time   PHART 7.386 10/21/2020 0930   PCO2ART 44.2 10/21/2020  0930   PO2ART 78 (L) 10/21/2020 0930   HCO3 26.6 10/21/2020 0930   TCO2 28 10/21/2020 0930   ACIDBASEDEF 0.9 10/11/2020 2238   O2SAT 95.0 10/21/2020 0930    HbA1C: Hgb A1c MFr Bld  Date/Time Value Ref Range Status  10/12/2020 02:51 AM 7.4 (H) 4.8 - 5.6 % Final    Comment:    (NOTE) Pre diabetes:          5.7%-6.4%  Diabetes:              >6.4%  Glycemic control for   <7.0% adults with diabetes   03/11/2020 01:30 PM 6.6 (H) 4.8 - 5.6 % Final    Comment:    (NOTE) Pre diabetes:          5.7%-6.4%  Diabetes:              >6.4%  Glycemic control for   <7.0% adults with diabetes     CBG: Recent Labs  Lab 10/21/20 1915 10/21/20 1939 10/21/20 2340 10/22/20 0255 10/22/20 0737  GLUCAP 66* 126* 177* 219* 182*    Allergies Allergies  Allergen Reactions   Codeine Nausea And Vomiting   Tetracyclines & Related Hives   Venlafaxine     Other reaction(s): sexual sxs     Home Medications  Prior to Admission medications   Medication Sig Start Date End Date Taking? Authorizing Provider  acetaminophen (TYLENOL) 500 MG tablet Take 1,000 mg by mouth in the morning and at bedtime.   Yes [provider]  albuterol (PROVENTIL) (2.5 MG/3ML) 0.083% nebulizer solution Take 3 mLs (2.5 mg total) by nebulization every 6 (six) hours as needed for wheezing or shortness of breath. 12/16/19  Yes Martyn Ehrich, NP  amLODipine (NORVASC) 10 MG tablet Take 10 mg by mouth daily.   Yes [provider]  Apoaequorin (PREVAGEN PO) Take 1 capsule by mouth daily.   Yes [provider]  aspirin EC 81 MG tablet Take 81 mg by mouth daily.   Yes [provider]  benzonatate (TESSALON) 100 MG capsule Take 100 mg by mouth 3 (three) times daily as needed for cough.   Yes [provider]  divalproex (DEPAKOTE) 250 MG DR tablet Take 250 mg by mouth  daily.   Yes [provider]  doxepin (SINEQUAN) 10 MG capsule Take 10 mg by mouth at bedtime. 09/24/20   Yes [provider]  Fluticasone-Umeclidin-Vilant (TRELEGY ELLIPTA) 100-62.5-25 MCG/INH AEPB Inhale 1 puff into the lungs daily. 01/20/20  Yes Rigoberto Noel, MD  gabapentin (NEURONTIN) 300 MG capsule Take 300 mg by mouth 2 (two) times daily.   Yes [provider]  Insulin Glargine (BASAGLAR KWIKPEN) 100 UNIT/ML Inject 5 Units into the skin daily. Patient taking differently: Inject 5-7 Units into the skin 2 (two) times daily. 5 units in the morning and 7 units at bedtime 03/15/20  Yes Alma Friendly, MD  insulin lispro (HUMALOG) 100 UNIT/ML injection Inject 2-12 Units into the skin 4 (four) times daily -  with meals and at bedtime. Per sliding scale   Yes [provider]  losartan (COZAAR) 50 MG tablet Take 1 tablet (50 mg total) by mouth daily. 03/15/20  Yes Alma Friendly, MD  metoprolol tartrate (LOPRESSOR) 25 MG tablet Take 0.5 tablets (12.5 mg total) by mouth 2 (two) times daily. Take 2 tablets (50 mg) by mouth every morning and 1 tablet (25 mg) at bedtime Patient taking differently: Take 25 mg by mouth 2 (two) times daily. 03/15/20  Yes Alma Friendly, MD  MS CONTIN 15 MG 12 hr tablet Take 1 tablet (15 mg total) by mouth every 8 (eight) hours. 10/14/20  Yes Gherghe, Vella Redhead, MD  ondansetron (ZOFRAN-ODT) 4 MG disintegrating tablet Take 4 mg by mouth every 6 (six) hours as needed for nausea.   Yes [provider]  predniSONE (DELTASONE) 10 MG tablet Take 4 tablets (40 mg total) by mouth daily. 4 tablets daily x 3 days then 3 daily x 3 days then 2 daily x 3 days then 1 daily x 3 days Patient taking differently: Take 30 mg by mouth daily. For 3 days (10.3.22 to 10.5.22) 10/13/20  Yes Gherghe, Vella Redhead, MD  sertraline (ZOLOFT) 50 MG tablet Take 50 mg by mouth daily.   Yes [provider]  simvastatin (ZOCOR) 20 MG tablet Take 20 mg by mouth at bedtime.   Yes [provider]  vitamin B-12 1000 MCG tablet Take 1 tablet (1,000 mcg total)  by mouth daily. 03/16/20  Yes Alma Friendly, MD  OXYGEN Inhale 3.5 L into the lungs continuous.    [provider]   CRITICAL CARE Performed by: Magdalen Spatz   Total critical care time:  40 minutes  Critical care time was exclusive of separately billable procedures and treating other patients.  Critical care was necessary to treat or prevent imminent or life-threatening deterioration.  Critical care was time spent personally by me on the following activities: development of treatment plan with patient and/or surrogate as well as nursing, discussions with consultants, evaluation of patient's response to treatment, examination of patient, obtaining history from patient or surrogate, ordering and performing treatments and interventions, ordering and review of laboratory studies, ordering and review of radiographic studies, pulse oximetry and re-evaluation of patient's condition.  Magdalen Spatz, MSN, AGACNP-BC [place] for personal pager PCCM on call pager [phone]  10/22/2020 8:59 AM

## 2020-10-22 NOTE — Progress Notes (Signed)
RT NOTES: Pt refusing CPT at this time d/t back pain.

## 2020-10-22 NOTE — Procedures (Signed)
Cortrak ? ?Tube Type:  Cortrak - 43 inches ?Tube Location:  Right nare ?Initial Placement:  Stomach ?Secured by: Bridle ?Technique Used to Measure Tube Placement:  Marking at nare/corner of mouth ?Cortrak Secured At:  70 cm ? ?Cortrak Tube Team Note: ? ?Consult received to place a Cortrak feeding tube.  ? ?X-ray is required, abdominal x-ray has been ordered by the Cortrak team. Please confirm tube placement before using the Cortrak tube.  ? ?If the tube becomes dislodged please keep the tube and contact the Cortrak team at [email or web address] (password [email or web address]) for replacement.  ?If after hours and replacement cannot be delayed, place a NG tube and confirm placement with an abdominal x-ray.  ? ? ?Zenola Dezarn MS, RD, LDN ?Please refer to AMION for RD and/or RD on-call/weekend/after hours pager ? ? ?

## 2020-10-22 NOTE — Progress Notes (Signed)
[facility] Progress Note Patient Name: [name] DOB: December 31, 1955 MRN: [id number]   Date of Service  10/22/2020  HPI/Events of Note  Hyperglycemia - Blood glucose = 219.  eICU Interventions  Will decrease D5 LR from 50 to 20 mL/hour.      Intervention Category Major Interventions: Hyperglycemia - active titration of insulin therapy  Lysle Dingwall 10/22/2020, 4:43 AM

## 2020-10-22 NOTE — Progress Notes (Signed)
In to assess pt after shift change. Removed Bipap to perform mouth care and as [name] was being placed the pt vomited coffee ground emesis. Pts mouth immediately suctioned and placed on 6L [place]ter. Pt able to voice name and had a weak cough.   [place] contacted and made aware of above events. While waiting on ground team to arrive pts O2 sats continued to decline to about 86% despite increased [place] to 12 Liters. Pt with decreased breath sounds and increased WOB. Pt continued to be awake and alert to name.  CCM MD and PA at bedside. Decision was made to re-intubate pt.   OG tube placed after intubation and about 300cc of coffee ground emesis suctioned via OG tube. Stat labs ordered and drawn.

## 2020-10-22 NOTE — Progress Notes (Signed)
[place] Progress Note Patient Name: [name] DOB: [birth year] MRN: [id number]   Date of Service  10/22/2020  HPI/Events of Note  Agitation - Nursing request for Fentanyl IV infusion.  eICU Interventions  Plan: Fentanyl IV infusion. Titrate to RASS = 0 to -1.     Intervention Category Major Interventions: Delirium, psychosis, severe agitation - evaluation and management  [name] 10/22/2020, 10:29 PM

## 2020-10-22 NOTE — Progress Notes (Addendum)
Interim Progress Note  Called to bedside for increased respiratory distress and air hunger.  Placed on BiPAP with significant pressure support.  Pt. Is delirious, confused and just keeps repeating the same phrases over and over again.  He was refusing BiPAP, and I had RT place him on BiPAP as an emergent need. In his current state of delirium he is unable to make medical decisions.  Stat CXR ordered, Lasix IV ordered  Will get an ABG to assess CO2 , as possible cause of confusion, and to assess pH. Dr. Loanne Drilling is aware of above.  Magdalen Spatz, MSN, AGACNP-BC [place] for personal pager PCCM on call pager [phone]  10/22/2020 2:29 PM

## 2020-10-22 NOTE — Progress Notes (Signed)
   10/22/20 2019  Vent Select  $ Ventilator Initial/Subsequent  Subsequent  Invasive or Noninvasive Invasive  Adult Vent Y  Adult Ventilator Settings  Vent Type Servo i  Humidity HME  Vent Mode PRVC  Vt Set 520 mL  Set Rate 18 bmp  FiO2 (%) 40 %  PEEP 5 cmH20  Adult Ventilator Measurements  Peak Airway Pressure 33 L/min  Mean Airway Pressure 15 cmH20  Plateau Pressure 15 cmH20  Resp Rate Spontaneous 6 br/min  Resp Rate Total 24 br/min  Exhaled Vt 733 mL  Spont TV 575 mL  Measured Ve 10.9 mL  I:E Ratio Measured 1:2.6  Auto PEEP 0 cmH20  Total PEEP 5 cmH20  SpO2 97 %  Adult Ventilator Alarms  Alarms On Y  Ve High Alarm 21 L/min  Ve Low Alarm 4 L/min  Resp Rate High Alarm 40 br/min  Resp Rate Low Alarm 10  PEEP Low Alarm 3 cmH2O  Press High Alarm 50 cmH2O  T Apnea 20 sec(s)  Pt. Reintubated due to respiratory distress by PA with no incident.

## 2020-10-22 NOTE — Progress Notes (Addendum)
Nutrition Follow-up  DOCUMENTATION CODES:   Severe malnutrition in context of chronic illness  INTERVENTION:   Tube feeding via Cortrak tube: Recommend begin trickle tube feedings 10/8 with Glucerna 1.2 at 20 ml/h. If trickle feedings are tolerated without abdominal distention, pain, or vomiting, recommend increasing by 10 ml every 12 hours to goal rate of 70 ml/h (1680 ml per day).  At goal rate, TF will provide 2016 kcal, 101 gm protein, 1352 ml free water daily.  Continue MVI with minerals 1 tablet daily, crushed and given via tube.  NUTRITION DIAGNOSIS:   Severe Malnutrition related to chronic illness (COPD) as evidenced by severe muscle depletion, severe fat depletion.  Ongoing  GOAL:   Patient will meet greater than or equal to 90% of their needs  Progressing  MONITOR:   Vent status, Labs, TF tolerance, Skin  REASON FOR ASSESSMENT:   Ventilator, Consult Enteral/tube feeding initiation and management  ASSESSMENT:   65 yo male admitted with acute on chronic respiratory failure after being found down at his rehab facility. PMH includes severe COPD, active smoker, chronic respiratory failure, asthma, anxiety, arthritis, HLD, HTN, left ankle ulcer, chronic narcotic use, DM type 1, depression, hearing loss, cochlear implant, DM retinopathy.  Discussed patient in ICU rounds and with RN today. Extubated 10/6. Required BiPAP last night.  S/P swallow evaluation with SLP today, not ready for POs.  Cortrak placed today, tip is gastric per abdominal x-ray, however, x-ray also raised concern for questionable ileus or obstruction. Patient also developed respiratory distress and patient is now requiring BiPAP. Holding off on TF today and may begin trickle TF tomorrow.   Labs reviewed. Phos 4.8 (H), K 4.8 WNL, Mag 2.1 WNL CBG: 219-182  Medications reviewed and include Depakote, Colace, Novolog SSI, Solumedrol, MVI with minerals, Protonix, Miralax, Precedex.  Admission weight  57.8 kg (10/4) Current weight 64.3 kg  I/O +5.4 L since admission UOP 3,445 ml x 24 hours  Diet Order:   Diet Order             Diet NPO time specified  Diet effective now                   EDUCATION NEEDS:   Not appropriate for education at this time  Skin:  Skin Assessment: Skin Integrity Issues: Skin Integrity Issues:: DTI DTI: sacrum  Last BM:  no BM documented  Height:   Ht Readings from Last 1 Encounters:  10/25/2020 5\' 9"  (1.753 m)    Weight:   Wt Readings from Last 1 Encounters:  10/22/20 64.3 kg    BMI:  Body mass index is 20.93 kg/m.  Estimated Nutritional Needs:   Kcal:  1800-2000  Protein:  95-115 gm  Fluid:  >/= 1.85 L    Lucas Mallow, RD, LDN, CNSC Please refer to Amion for contact information.
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
## 2020-10-22 NOTE — Procedures (Signed)
Intubation Procedure Note  [name]  [id number]  Sep 30, 1955  Date:10/22/20  Time:8:21 PM   Provider Performing:Theodora Lalanne D Rollene Rotunda    Procedure: Intubation (16579)  Indication(s) Respiratory Failure  Consent Unable to obtain consent due to emergent nature of procedure.   Anesthesia Fentanyl and Propofol   Time Out Verified patient identification, verified procedure, site/side was marked, verified correct patient position, special equipment/implants available, medications/allergies/relevant history reviewed, required imaging and test results available.   Sterile Technique Usual hand hygeine, masks, and gloves were used   Procedure Description Patient positioned in bed supine.  Sedation given as noted above.  Patient was intubated with endotracheal tube using Glidescope.  View was Grade 1 full glottis .  Number of attempts was 1.  Colorimetric CO2 detector was consistent with tracheal placement.   Complications/Tolerance None; patient tolerated the procedure well. Chest X-ray is ordered to verify placement.   EBL Minimal   Specimen(s) None  [name] [facility] 10/22/2020, 8:21 PM  Please see [email or web address] for pager details.  From 7A-7P if no response, please call [phone]. After hours, please call ELink [phone].

## 2020-10-22 NOTE — Progress Notes (Addendum)
CCM Night shift Event note  Patient evaluated for increased work of breathing.    Upon arrival, patient was off BIPAP. Nurse reported that she had removed BIPAP to perform oral care and patient had a large episode of coffee ground emesis. The BIPAP was off at the time.  Physical Exam Vitals reviewed.  Constitutional:      Comments: Increased work of breathing, lethargic but nodded appropiately  HENT:     Head: Normocephalic and atraumatic.  Eyes:     Extraocular Movements: Extraocular movements intact.     Pupils: Pupils are equal, round, and reactive to light.  Cardiovascular:     Rate and Rhythm: Regular rhythm. Bradycardia present.  Pulmonary:     Effort: Tachypnea present.     Breath sounds: Examination of the right-lower field reveals decreased breath sounds. Examination of the left-lower field reveals decreased breath sounds. Decreased breath sounds present. No wheezing.  Abdominal:     Palpations: Abdomen is soft.  Musculoskeletal:     Right lower leg: No edema.     Left lower leg: No edema.     Comments: Varicosities of feet  Skin:    General: Skin is warm and dry.     Comments: Left knee abrasion, DTI buttocks  Neurological:     Comments: Globally weak. Oriented to self   Assessment and Plan:  Acute on chronic hypoxic and hypercarbic respiratory failure with increased work of breathing- failed BIPAP. He has practically no cough because of severe weakness. Patient was re intubated.  Plan: lung protective vent protocol. F/U post intubation ABG. Continue on bronchodilators, antibiotics.  2. Severe debility. CT head negative but patient appears extremely weaker than one whom has only been intubated for 3 days. Plan: schedule for MRI brain. If negative then consider stopping Doxepin.  3. Coffee ground emesis- NGT lavage no bright red output. Plan: check H&H.  Family: wife updated.  She also reported that Doxepin was being cut back by his pain doctor.  She also feels  that he has had undiagnosed COVID and now with the complications of the virus.  Thank you for allowing me the privilege to care for this patient.  I have dedicated a total of 45 minutes of additional critical care time minus all appropriate exclusions.

## 2020-10-23 ENCOUNTER — Inpatient Hospital Stay (HOSPITAL_COMMUNITY): Payer: Medicare Other

## 2020-10-23 DIAGNOSIS — J441 Chronic obstructive pulmonary disease with (acute) exacerbation: Secondary | ICD-10-CM | POA: Diagnosis not present

## 2020-10-23 DIAGNOSIS — E43 Unspecified severe protein-calorie malnutrition: Secondary | ICD-10-CM

## 2020-10-23 DIAGNOSIS — R531 Weakness: Secondary | ICD-10-CM | POA: Diagnosis not present

## 2020-10-23 DIAGNOSIS — J9622 Acute and chronic respiratory failure with hypercapnia: Secondary | ICD-10-CM | POA: Diagnosis not present

## 2020-10-23 DIAGNOSIS — J9602 Acute respiratory failure with hypercapnia: Secondary | ICD-10-CM | POA: Diagnosis not present

## 2020-10-23 LAB — CBC WITH DIFFERENTIAL/PLATELET
Abs Immature Granulocytes: 0.19 10*3/uL — ABNORMAL HIGH (ref 0.00–0.07)
Basophils Absolute: 0 10*3/uL (ref 0.0–0.1)
Basophils Relative: 0 %
Eosinophils Absolute: 0 10*3/uL (ref 0.0–0.5)
Eosinophils Relative: 0 %
HCT: 34.8 % — ABNORMAL LOW (ref 39.0–52.0)
Hemoglobin: 11.1 g/dL — ABNORMAL LOW (ref 13.0–17.0)
Immature Granulocytes: 1 %
Lymphocytes Relative: 7 %
Lymphs Abs: 1 10*3/uL (ref 0.7–4.0)
MCH: 28.3 pg (ref 26.0–34.0)
MCHC: 31.9 g/dL (ref 30.0–36.0)
MCV: 88.8 fL (ref 80.0–100.0)
Monocytes Absolute: 0.5 10*3/uL (ref 0.1–1.0)
Monocytes Relative: 3 %
Neutro Abs: 13.1 10*3/uL — ABNORMAL HIGH (ref 1.7–7.7)
Neutrophils Relative %: 89 %
Platelets: 275 10*3/uL (ref 150–400)
RBC: 3.92 MIL/uL — ABNORMAL LOW (ref 4.22–5.81)
RDW: 14.3 % (ref 11.5–15.5)
WBC: 14.8 10*3/uL — ABNORMAL HIGH (ref 4.0–10.5)
nRBC: 0 % (ref 0.0–0.2)

## 2020-10-23 LAB — COMPREHENSIVE METABOLIC PANEL
ALT: 19 U/L (ref 0–44)
AST: 19 U/L (ref 15–41)
Albumin: 2.3 g/dL — ABNORMAL LOW (ref 3.5–5.0)
Alkaline Phosphatase: 57 U/L (ref 38–126)
Anion gap: 12 (ref 5–15)
BUN: 60 mg/dL — ABNORMAL HIGH (ref 8–23)
CO2: 26 mmol/L (ref 22–32)
Calcium: 8.6 mg/dL — ABNORMAL LOW (ref 8.9–10.3)
Chloride: 102 mmol/L (ref 98–111)
Creatinine, Ser: 1.71 mg/dL — ABNORMAL HIGH (ref 0.61–1.24)
GFR, Estimated: 44 mL/min — ABNORMAL LOW (ref >60)
Glucose, Bld: 207 mg/dL — ABNORMAL HIGH (ref 70–99)
Potassium: 4.4 mmol/L (ref 3.5–5.1)
Sodium: 140 mmol/L (ref 135–145)
Total Bilirubin: 0.9 mg/dL (ref 0.3–1.2)
Total Protein: 5.3 g/dL — ABNORMAL LOW (ref 6.5–8.1)

## 2020-10-23 LAB — CK: Total CK: 38 U/L — ABNORMAL LOW (ref 49–397)

## 2020-10-23 LAB — GLUCOSE, CAPILLARY
Glucose-Capillary: 177 mg/dL — ABNORMAL HIGH (ref 70–99)
Glucose-Capillary: 164 mg/dL — ABNORMAL HIGH (ref 70–99)
Glucose-Capillary: 274 mg/dL — ABNORMAL HIGH (ref 70–99)
Glucose-Capillary: 221 mg/dL — ABNORMAL HIGH (ref 70–99)
Glucose-Capillary: 177 mg/dL — ABNORMAL HIGH (ref 70–99)

## 2020-10-23 LAB — TSH: TSH: 2.249 u[IU]/mL (ref 0.350–4.500)

## 2020-10-23 LAB — VITAMIN B12: Vitamin B-12: 1372 pg/mL — ABNORMAL HIGH (ref 180–914)

## 2020-10-23 LAB — MAGNESIUM: Magnesium: 2 mg/dL (ref 1.7–2.4)

## 2020-10-23 LAB — LACTIC ACID, PLASMA: Lactic Acid, Venous: 1.1 mmol/L (ref 0.5–1.9)

## 2020-10-23 LAB — AMMONIA: Ammonia: 18 umol/L (ref 9–35)

## 2020-10-23 LAB — HIV ANTIBODY (ROUTINE TESTING W REFLEX): HIV Screen 4th Generation wRfx: NONREACTIVE
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
## 2020-10-23 NOTE — Progress Notes (Signed)
Pt transported from 2M05 to MRI 3 on the ventilator. RT and RN accompanied pt throughout. VSS during. RT will continue to monitor and be available as needed.

## 2020-10-23 NOTE — Progress Notes (Signed)
SLP Cancellation Note  Patient Details Name: [name] MRN: [id number] DOB: January 11, 1956   Cancelled treatment:       Reason Eval/Treat Not Completed: Medical issues which prohibited therapy; pt reintubated yesterday. Will continue to follow.    [place], CCC-SLP Acute Rehabilitation Services   10/23/2020, 8:50 AM

## 2020-10-23 NOTE — Progress Notes (Signed)
NAME:  [name], MRN:  [id number], DOB:  Feb 12, 1955, LOS: 4 ADMISSION DATE:  10/30/2020, CONSULTATION DATE:  10/4 REFERRING MD:  Gilford Raid, CHIEF COMPLAINT:  Hypercarbic Respiratory Failure   History of Present Illness:  All information from medical records as patient is intubated and sedated.   65 year old male current every day smoker with history of asthma, severe COPD with chronic hypoxemia on 3.5 L during the day, 3 L at night, type 2 diabetes, CKD stage IIIb, PVD and hypertension presents to the ED 10/4  with AMS.  Pt was admitted 1 week prior to this admission week (9/26-29) for COPD exacerbation and sent to [facility] for rehab on 10/14/2020 with prednisone taper and 3 day course of augementin. He was apparently normal at 1000 on the day of admission, but was noticed to have increased WOB at 1100.  Pt was given 1x  nebulizer treatment by the facility and EMS was called.  When EMS arrived, he was found to be unresponsive and required BVM with oral airway.  After he was bagged for several minutes, he started to wake up some and he was put on a 15 L NRB. Pt was given 20 mg albuterol, 125 mg solumedrol, and 2 g magnesium en route. In the ED he was given a BiPAP trial, which he failed, ( ABG after BiPAP was 7.23, 78.7/74/ bicarb 33.7/ sat of 91%) and patient was intubated in the ED. After intubation, patient became hypotensive and bradycardic. He received atropine 1mg  x 1 and 2L of LR. PCCM have been called to admit to ICU and manage care. On the unit patient remained hypotensive but responded appropriately to additional fluid bolus.   Of note, patient does have a BiPAP machine that he does not wear per his wife.  It was not with him at rehab.   Pt. can not be discharged to rehab without BiPAP in the future.   Pertinent  Medical History    Past Medical History:  Diagnosis Date   Anxiety    Arthritis    Chronic narcotic use    Complication of anesthesia    h/o aspiration during back surgery    Depression    Diabetic peripheral neuropathy (HCC)    Hearing loss    wears bilateral hearing aids, bilat moderate SNHL   Hyperlipidemia    Hypertension    Insomnia    Tobacco abuse    Type 1 diabetes mellitus (HCC)    Ulcer of left ankle (HCC)    Vision abnormalities    mild DM retinopathy     Significant Hospital Events: Including procedures, antibiotic start and stop dates in addition to other pertinent events   Recent Hospitalization  (9/26-29) for COPD exacerbation , discharged to [facility] without BiPAP 11/04/2020 Admission to [place] with AMS, found to be hypercarbic failed BiPAP trial intubated, Ceftriaxone 10/4, azithromycin 10/4  10/20/2020 transitioned to cefepime for HCAP coverage. Failed SBT due to hypoxic, tachypnea, agitation  Extubated 10/6, to 6 L , BiPAP overnight  10/8 - Intubated  Interim History / Subjective:  Intubated overnight after emesis while on BiPAP. Remains hypertensive. Had BM overnight  Objective   Blood pressure (!) 146/54, pulse (!) 47, temperature 98.9 F (37.2 C), temperature source Oral, resp. rate 19, height 5\' 9"  (1.753 m), weight 61.2 kg, SpO2 99 %.    Vent Mode: PRVC FiO2 (%):  [40 %-60 %] 50 % Set Rate:  [10 bmp-18 bmp] 18 bmp Vt Set:  [945  mL] 520 mL PEEP:  [5 cmH20-6 cmH20] 6 cmH20 Plateau Pressure:  [14 cmH20-19 cmH20] 16 cmH20   Intake/Output Summary (Last 24 hours) at 10/23/2020 0751 Last data filed at 10/23/2020 0700 Gross per 24 hour  Intake 1813.42 ml  Output 2385 ml  Net -571.58 ml   Filed Weights   10/21/20 0416 10/22/20 0500 10/23/20 0500  Weight: 65.7 kg 64.3 kg 61.2 kg   Physical Exam: General: Critically ill-appearing, no acute distress HENT: [place], AT, ETT in place, left cochlear implant Eyes: EOMI, no scleral icterus Respiratory: Diminished breath sounds bilaterally.  No crackles, wheezing or rales Cardiovascular: RRR, -M/R/G, no JVD GI: BS+, soft, nontender Extremities:-Edema,-tenderness Neuro: Awake,  follows commands to moving lower extremities, no movement of upper extremities, profoundly weak  Labs reviewed 10/8 Cr 1.71 stable WBC 14 improving Stable anemia 11  Trach aspirate 10/7 - pending Bcx 10/4 - pending   CT Head 10/21/2020  Limited evaluation due to beam hardening artifact from left cochlear implant. Within this limitation, no acute intracranial process.  Resolved Hospital Problem list   Peri-intubation shock, lactic acidosis  Assessment & Plan:   Acute metabolic encephalopathy exacerbated by hypertension ICU delirium - hearing aid and glasses in place CT head 10/6 neg MRI brain ordered Consult Neuro Frequent re-orientation  Acute on Chronic Hypercarbic Respiratory Failure in setting of AECOPD and RLL PNA  -on 3.5L day, 3L HS COPD and asthma Current every day smoker Extubated 10/6, reintubated 10/8 Plan -possible RLL consolidation v atelectasis on CXR this AM.  -Known OSA and has been non adherent with his BIPAP which could also be contributing.  -Will need BiPAP ( that he has at home ) at rehab facility once discharged -Has refused BiPAP post-extubation 10/7 worsening respiratory distress. Had emesis on BiPAP overnight and was re-intubated Plan: Full vent support. Wean FIO2/PEEP for goal SpO2 88-95% Continue ceftriaxone and azithromycin CXR prn  Scheduled Pulmicort and DuoNebs, PRN albuterol, methylpred 40mg  BID  Minimize sedation for RASS of -1. Currently on Precedex  Bradycardia secondary to sedation DC Precedex PAD protocol with Fentanyl for RASS goal 0  Hypertension Cardene gtt for SBP goal <180 On home amlodipine and metoprolol  T2DM Wears Freestyle Libre at home  Received 18u of ssi ON, on levemir 20u BID Plan: CBG Q4  Continue levemir + SSI  CKD stage IIIb, at baseline Unclear BL sCr though 1.5-1.7 seems to be where he is typically.  Plan: Monitor I&O Trend BMET daily. AM BMP pending.  Replete electrolytes as needed  Avoid  nephrotoxic medications , maintain renal perfusion  Depression  Plan Depakote 250 daily  Restart home Zoloft and Sinequan   Chronic Pain  Plan Restart home Neurontin to assist with sedation weaning  On home MS Contin 15 mg BID. Will add scheduled oxycodone to help with weaning of sedation.   Nutrition  Plan TF per dietitian Cor Trac placement 10/7   Abnormal Low Dose CT 05/2020> 4A > suspected infectious/ inflammatory Due for Follow up 08/2020 , not yet done Every day smoker 1- 1.5 per day prior to hospitalization 10/11/2020 40 + pack year smoking history Plan Needs smoking cessation counseling  Best Practice (right click and "Reselect all SmartList Selections" daily)   Diet/type: tubefeeds DVT prophylaxis: prophylactic heparin  GI prophylaxis: PPI Lines: N/A Foley:  Yes, and it is still needed Code Status:  full code Last date of multidisciplinary goals of care discussion : Wife updated 10/8, with plan of care.  She is caring for  a toddler, and she is only able to come to visit MWF 9 am - 1 pm, while the child is in daycare.  [place] , [phone] ( Wife) Daughter Maple Mirza [phone]  Labs   CBC: Recent Labs  Lab 11/13/2020 1236 10/24/2020 1321 10/20/20 0441 10/21/20 0930 10/21/20 1000 10/22/20 0249 10/22/20 1424 10/22/20 2125 10/23/20 0115  WBC 26.1*   < > 25.4*  --  20.5* 9.4  --  18.7* 14.8*  NEUTROABS 22.8*  --  22.4*  --   --   --   --   --  13.1*  HGB 14.4   < > 11.7*   < > 11.9* 10.3* 9.9* 11.9* 11.1*  HCT 44.5   < > 36.0*   < > 37.2* 32.3* 29.0* 35.6* 34.8*  MCV 89.2   < > 88.7  --  89.2 89.0  --  88.1 88.8  PLT 375   < > 204  --  193 176  --  292 275   < > = values in this interval not displayed.    Basic Metabolic Panel: Recent Labs  Lab 10/20/20 0441 10/20/20 1955 10/21/20 0450 10/21/20 0930 10/21/20 1000 10/21/20 1627 10/22/20 0249 10/22/20 1424 10/22/20 1652 10/22/20 2021 10/22/20 2125 10/23/20 0115  NA  --   --  140   <  > 138  --  138 139  --  139 138 140  K  --   --  5.3*   < > 4.4  --  4.8 4.5  --  4.5 4.5 4.4  CL  --   --  106  --  101  --  102  --   --  103 101 102  CO2  --   --  16*  --  25  --  24  --   --  25 23 26   GLUCOSE  --   --  72  --  113*  --  235*  --   --  203* 196* 207*  BUN  --   --  55*  --  50*  --  53*  --   --  52* 59* 60*  CREATININE  --   --  1.86*  --  1.70*  --  1.69*  --   --  1.69* 1.69* 1.71*  CALCIUM  --   --  8.3*  --  8.6*  --  8.2*  --   --  8.3* 8.7* 8.6*  MG 1.9   < > 2.5*  --   --  2.3 2.1  --   --  2.1  --  2.0  PHOS 4.8*  --  4.5  --   --  4.6 4.8*  --  4.4  --   --   --    < > = values in this interval not displayed.   GFR: Estimated Creatinine Clearance: 36.8 mL/min (A) (by C-G formula based on SCr of 1.71 mg/dL (H)). Recent Labs  Lab 10/31/2020 1509 11/15/2020 1651 10/20/20 1211 10/20/20 1336 10/20/20 1955 10/21/20 1000 10/22/20 0249 10/22/20 2125 10/23/20 0115  PROCALCITON 0.58  --  3.30  --   --   --   --   --   --   WBC 14.0*   < >  --   --   --  20.5* 9.4 18.7* 14.8*  LATICACIDVEN  --    < >  --  3.4* 3.0* 2.3*  --   --  1.1   < > =  values in this interval not displayed.    Liver Function Tests: Recent Labs  Lab 10/20/20 0441 10/23/20 0115  AST 15 19  ALT 19 19  ALKPHOS 54 57  BILITOT 0.3 0.9  PROT 5.0* 5.3*  ALBUMIN 2.4* 2.3*   No results for input(s): LIPASE, AMYLASE in the last 168 hours. Recent Labs  Lab 10/22/20 1242  AMMONIA 35    ABG    Component Value Date/Time   PHART 7.482 (H) 10/22/2020 2123   PCO2ART 34.4 10/22/2020 2123   PO2ART 57.1 (L) 10/22/2020 2123   HCO3 25.5 10/22/2020 2123   TCO2 27 10/22/2020 1424   ACIDBASEDEF 0.9 10/11/2020 2238   O2SAT 90.5 10/22/2020 2123    HbA1C: Hgb A1c MFr Bld  Date/Time Value Ref Range Status  10/12/2020 02:51 AM 7.4 (H) 4.8 - 5.6 % Final    Comment:    (NOTE) Pre diabetes:          5.7%-6.4%  Diabetes:              >6.4%  Glycemic control for   <7.0% adults with  diabetes   03/11/2020 01:30 PM 6.6 (H) 4.8 - 5.6 % Final    Comment:    (NOTE) Pre diabetes:          5.7%-6.4%  Diabetes:              >6.4%  Glycemic control for   <7.0% adults with diabetes     CBG: Recent Labs  Lab 10/22/20 1505 10/22/20 1917 10/22/20 2347 10/23/20 0320 10/23/20 0717  GLUCAP 199* 163* 196* 177* 164*    Allergies Allergies  Allergen Reactions   Codeine Nausea And Vomiting   Tetracyclines & Related Hives   Venlafaxine     Other reaction(s): sexual sxs     Home Medications  Prior to Admission medications   Medication Sig Start Date End Date Taking? Authorizing Provider  acetaminophen (TYLENOL) 500 MG tablet Take 1,000 mg by mouth in the morning and at bedtime.   Yes [provider]  albuterol (PROVENTIL) (2.5 MG/3ML) 0.083% nebulizer solution Take 3 mLs (2.5 mg total) by nebulization every 6 (six) hours as needed for wheezing or shortness of breath. 12/16/19  Yes Martyn Ehrich, NP  amLODipine (NORVASC) 10 MG tablet Take 10 mg by mouth daily.   Yes [provider]  Apoaequorin (PREVAGEN PO) Take 1 capsule by mouth daily.   Yes [provider]  aspirin EC 81 MG tablet Take 81 mg by mouth daily.   Yes [provider]  benzonatate (TESSALON) 100 MG capsule Take 100 mg by mouth 3 (three) times daily as needed for cough.   Yes [provider]  divalproex (DEPAKOTE) 250 MG DR tablet Take 250 mg by mouth daily.   Yes [provider]  doxepin (SINEQUAN) 10 MG capsule Take 10 mg by mouth at bedtime. 09/24/20  Yes [provider]  Fluticasone-Umeclidin-Vilant (TRELEGY ELLIPTA) 100-62.5-25 MCG/INH AEPB Inhale 1 puff into the lungs daily. 01/20/20  Yes Rigoberto Noel, MD  gabapentin (NEURONTIN) 300 MG capsule Take 300 mg by mouth 2 (two) times daily.   Yes [provider]  Insulin Glargine (BASAGLAR KWIKPEN) 100 UNIT/ML Inject 5 Units into the skin daily. Patient taking differently: Inject  5-7 Units into the skin 2 (two) times daily. 5 units in the morning and 7 units at bedtime 03/15/20  Yes Alma Friendly, MD  insulin lispro (HUMALOG) 100 UNIT/ML injection Inject 2-12 Units into  the skin 4 (four) times daily -  with meals and at bedtime. Per sliding scale   Yes [provider]  losartan (COZAAR) 50 MG tablet Take 1 tablet (50 mg total) by mouth daily. 03/15/20  Yes Alma Friendly, MD  metoprolol tartrate (LOPRESSOR) 25 MG tablet Take 0.5 tablets (12.5 mg total) by mouth 2 (two) times daily. Take 2 tablets (50 mg) by mouth every morning and 1 tablet (25 mg) at bedtime Patient taking differently: Take 25 mg by mouth 2 (two) times daily. 03/15/20  Yes Alma Friendly, MD  MS CONTIN 15 MG 12 hr tablet Take 1 tablet (15 mg total) by mouth every 8 (eight) hours. 10/14/20  Yes Gherghe, Vella Redhead, MD  ondansetron (ZOFRAN-ODT) 4 MG disintegrating tablet Take 4 mg by mouth every 6 (six) hours as needed for nausea.   Yes [provider]  predniSONE (DELTASONE) 10 MG tablet Take 4 tablets (40 mg total) by mouth daily. 4 tablets daily x 3 days then 3 daily x 3 days then 2 daily x 3 days then 1 daily x 3 days Patient taking differently: Take 30 mg by mouth daily. For 3 days (10.3.22 to 10.5.22) 10/13/20  Yes Gherghe, Vella Redhead, MD  sertraline (ZOLOFT) 50 MG tablet Take 50 mg by mouth daily.   Yes [provider]  simvastatin (ZOCOR) 20 MG tablet Take 20 mg by mouth at bedtime.   Yes [provider]  vitamin B-12 1000 MCG tablet Take 1 tablet (1,000 mcg total) by mouth daily. 03/16/20  Yes Alma Friendly, MD  OXYGEN Inhale 3.5 L into the lungs continuous.    [provider]   CRITICAL CARE Performed by: Margaretha Seeds   Total critical care time:  40 minutes  Critical care time was exclusive of separately billable procedures and treating other patients.  Critical care was necessary to treat or prevent imminent or life-threatening  deterioration.  Critical care was time spent personally by me on the following activities: development of treatment plan with patient and/or surrogate as well as nursing, discussions with consultants, evaluation of patient's response to treatment, examination of patient, obtaining history from patient or surrogate, ordering and performing treatments and interventions, ordering and review of laboratory studies, ordering and review of radiographic studies, pulse oximetry and re-evaluation of patient's condition.  Magdalen Spatz, MSN, AGACNP-BC [place] for personal pager PCCM on call pager [phone]  10/23/2020 7:51 AM

## 2020-10-23 NOTE — Progress Notes (Signed)
     Referral received for [name] :goals of care discussion. Chart reviewed and updates received. Patient is currently off the unit at MRI. Attempted to contact patient's wife, [name]. Unable to reach. Voicemail left with contact information given.   1520: Patient back in room. I was able to speak with wife and introduce myself and Palliative's role while hospitalized. Wife shares she care for a 65 year old and unfortunately is only here to visit for a brief time as she has to get back home. Prefers to have face-to-face discussions however is not able to meet until Monday. Expresses goals are to continue with current treatment.  [place] meeting scheduled for Monday 10/10 @ 10 am as requested by [name]. Family is aware we will meet at patient's bedside.    Thank you for your referral and allowing PMT to assist in [name]'s care.   Alda Lea, AGPCNP-BC Palliative Medicine Team  Phone: [phone] Pager: [phone] Amion: N. [place]   NO CHARGE

## 2020-10-24 ENCOUNTER — Inpatient Hospital Stay (HOSPITAL_COMMUNITY): Payer: Medicare Other

## 2020-10-24 DIAGNOSIS — R4182 Altered mental status, unspecified: Secondary | ICD-10-CM

## 2020-10-24 DIAGNOSIS — R0603 Acute respiratory distress: Secondary | ICD-10-CM

## 2020-10-24 DIAGNOSIS — J441 Chronic obstructive pulmonary disease with (acute) exacerbation: Secondary | ICD-10-CM | POA: Diagnosis not present

## 2020-10-24 DIAGNOSIS — J9622 Acute and chronic respiratory failure with hypercapnia: Secondary | ICD-10-CM | POA: Diagnosis not present

## 2020-10-24 DIAGNOSIS — J9602 Acute respiratory failure with hypercapnia: Secondary | ICD-10-CM | POA: Diagnosis not present

## 2020-10-24 DIAGNOSIS — E43 Unspecified severe protein-calorie malnutrition: Secondary | ICD-10-CM | POA: Diagnosis not present

## 2020-10-24 LAB — CULTURE, BLOOD (ROUTINE X 2)
Culture: NO GROWTH
Culture: NO GROWTH
Special Requests: ADEQUATE

## 2020-10-24 LAB — CULTURE, RESPIRATORY W GRAM STAIN

## 2020-10-24 LAB — BASIC METABOLIC PANEL
Anion gap: 12 (ref 5–15)
BUN: 66 mg/dL — ABNORMAL HIGH (ref 8–23)
CO2: 23 mmol/L (ref 22–32)
Calcium: 8.5 mg/dL — ABNORMAL LOW (ref 8.9–10.3)
Chloride: 106 mmol/L (ref 98–111)
Creatinine, Ser: 1.84 mg/dL — ABNORMAL HIGH (ref 0.61–1.24)
GFR, Estimated: 40 mL/min — ABNORMAL LOW (ref >60)
Glucose, Bld: 209 mg/dL — ABNORMAL HIGH (ref 70–99)
Potassium: 4.7 mmol/L (ref 3.5–5.1)
Sodium: 141 mmol/L (ref 135–145)

## 2020-10-24 LAB — CALCIUM, IONIZED: Calcium, Ionized, Serum: 4 mg/dL — ABNORMAL LOW (ref 4.5–5.6)

## 2020-10-24 LAB — GLUCOSE, CAPILLARY
Glucose-Capillary: 132 mg/dL — ABNORMAL HIGH (ref 70–99)
Glucose-Capillary: 140 mg/dL — ABNORMAL HIGH (ref 70–99)
Glucose-Capillary: 143 mg/dL — ABNORMAL HIGH (ref 70–99)
Glucose-Capillary: 166 mg/dL — ABNORMAL HIGH (ref 70–99)
Glucose-Capillary: 167 mg/dL — ABNORMAL HIGH (ref 70–99)
Glucose-Capillary: 179 mg/dL — ABNORMAL HIGH (ref 70–99)

## 2020-10-24 LAB — CBC
HCT: 34.9 % — ABNORMAL LOW (ref 39.0–52.0)
Hemoglobin: 11.2 g/dL — ABNORMAL LOW (ref 13.0–17.0)
MCH: 28.9 pg (ref 26.0–34.0)
MCHC: 32.1 g/dL (ref 30.0–36.0)
MCV: 89.9 fL (ref 80.0–100.0)
Platelets: 299 10*3/uL (ref 150–400)
RBC: 3.88 MIL/uL — ABNORMAL LOW (ref 4.22–5.81)
RDW: 14.5 % (ref 11.5–15.5)
WBC: 14.9 10*3/uL — ABNORMAL HIGH (ref 4.0–10.5)
nRBC: 0 % (ref 0.0–0.2)

## 2020-10-24 LAB — ALDOLASE: Aldolase: 12.4 U/L — ABNORMAL HIGH (ref 3.3–10.3)

## 2020-10-24 LAB — RPR: RPR Ser Ql: NONREACTIVE

## 2020-10-24 MED ORDER — THIAMINE HCL 100 MG/ML IJ SOLN
100.0000 mg | Freq: Every day | INTRAMUSCULAR | Status: DC
  Administered 2020-10-27: 100 mg via INTRAVENOUS
  Filled 2020-10-24: qty 2

## 2020-10-24 MED ORDER — HEPARIN SODIUM (PORCINE) 5000 UNIT/ML IJ SOLN
5000.0000 [IU] | Freq: Three times a day (TID) | INTRAMUSCULAR | Status: DC
  Administered 2020-10-25 – 2020-10-28 (×10): 5000 [IU] via SUBCUTANEOUS
  Filled 2020-10-24 (×10): qty 1

## 2020-10-24 MED ORDER — THIAMINE HCL 100 MG/ML IJ SOLN
500.0000 mg | Freq: Three times a day (TID) | INTRAVENOUS | Status: AC
  Administered 2020-10-24 – 2020-10-26 (×9): 500 mg via INTRAVENOUS
  Filled 2020-10-24 (×11): qty 5

## 2020-10-24 MED ORDER — HYDRALAZINE HCL 50 MG PO TABS
50.0000 mg | ORAL_TABLET | Freq: Three times a day (TID) | ORAL | Status: DC
  Administered 2020-10-24 – 2020-10-27 (×9): 50 mg via ORAL
  Filled 2020-10-24 (×9): qty 1

## 2020-10-24 MED ORDER — SODIUM CHLORIDE 0.9 % IV SOLN
2.0000 g | INTRAVENOUS | Status: AC
  Administered 2020-10-24 – 2020-10-25 (×2): 2 g via INTRAVENOUS
  Filled 2020-10-24 (×2): qty 20

## 2020-10-24 NOTE — Progress Notes (Signed)
Pt trying to get OOB and "pawing" at tubes and lines  despite restrains and mittens. Attempted to redirect and assess source of agitation with yes and no questions. With some pointing and words mouthed around ETT RN determined Pt wanted his wife present and wanted to leave hospital. Explained his medical conditions to him and the fact that his wife had already gone home for the day and that his wife wanted him here for the time being. Pt obviously frustrated and remained only briefly redirectable. HR and RR elevated and vent alarming so PRN Dose of Versed given for agitation.

## 2020-10-24 NOTE — Procedures (Signed)
Patient Name: [name]  MRN: [id number]  Epilepsy Attending: Lora Havens  Referring Physician/Provider: Anibal Henderson, NP Date: 10/24/2020 Duration: 25.48 mins  Patient history: 65 y.o. male who presented for evaluation of unresponsiveness and respiratory failure on 10/4 with waxing and waning mental status with perseveration, global weakness, and variable extremity weakness versus left-sided neglect on examination. EEG to evaluate for seizure.   Level of alertness: lethargic   AEDs during EEG study: GBP  Technical aspects: This EEG study was done with scalp electrodes positioned according to the 10-20 International system of electrode placement. Electrical activity was acquired at a sampling rate of 500Hz  and reviewed with a high frequency filter of 70Hz  and a low frequency filter of 1Hz . EEG data were recorded continuously and digitally stored.   Description: EEG showed continuous generalized polymorphic sharply contoured 3 to 6 Hz theta-delta slowing. Generalized periodic discharges with triphasic morphology at 1.5-2 Hz were also noted.  Hyperventilation and photic stimulation were not performed.     ABNORMALITY - Periodic discharges with triphasic morphology, generalized ( GPDs) - Continuous slow, generalized  IMPRESSION: This study showed generalized periodic discharges with triphasic morphology at 1.5-2 Hz which is on the ictal-interictal continuum with low potential for seizures. This eeg pattern can also been seen due toxic-metabolic causes. Additionally there is moderate to severe diffuse encephalopathy, nonspecific etiology. No seizures were seen throughout the recording.  [name] Barbra Sarks

## 2020-10-24 NOTE — Progress Notes (Addendum)
Neurology Progress Note  Brief HPI: 65 y.o. male with PMHx for asthma, severe COPD and chronic hypoxic respiratory failure on home O2- noncompliant with BiPAP, tobacco use, DM2, CKD IIIb, PVD, HTN who presented to the ED 10/4 for evaluation of AMS after being found unresponsive at his rehab facility with failed BiPAP therapy in the ED and subsequent intubation. He was also admitted 9/26 for COPD exacerbation with d/c to rehab 9/29 on prednisone and Augmentin. His current hospitalization has been complicated by waxing and waning mental status, perseveration, slurred speech, various degrees of extremity weakness superimposed on diffuse weakness, and patient required re-intubation 10/8 while in the ICU.   At baseline, per records review, in 2020 patient had a documented MMSE of 27/30 with impaired attention and concentration, R > L pupillary constriction, left ptosis, significant atrophy of LLE, absent DTRs in BLE, a wide-based gait towards retropulsion and turns with falls, balance disturbance, with poor sleep habits, hygiene, polypharmacy, O2 dependence, and hearing loss. BLE weakness thought to be 2/2 claudication and PAD.   Subjective: No acute overnight events Palliative care to have goals of care discussion with patient's wife 10/10  Exam: Vitals:   10/24/20 0716 10/24/20 0733  BP:  (!) 174/61  Pulse:  98  Resp:  16  Temp: 99 F (37.2 C)   SpO2:  94%   Gen: Cachetic, chronically ill appearing male. Intubated, no longer on sedation in the ICU (Fentanyl gtt stopped 10-20 minutes prior to neurology assessment) Resp: oral ETT in place, secured, patient on PS with spontaneous respirations.  Abd: soft, non-distended.   Neuro: Mental Status: Patient is intubated and no longer on sedation in the ICU (day shift RN stopped Fentanyl gtt 10-20 minutes prior to neurology assessment). His eyes spontaneously open and he tracks to loud voice only with examiner's mask pulled down, he does not attend to  examiner with mask in place.  Patient does not follow commands this morning and does have more significant diffuse weakness than yesterday's examination.  Cranial Nerves: II: Pupils are equal, ovoid, and reactive to light.   III,IV, VI: Patient does briefly and inconsistently fixate and minimally track examiner with mask pulled down. He appears to have trouble tracking to the left.  V: Unable to assess facial sensation, there is no consistent blink to threat. He initially blinks to threat on the left but does not blink to threat in additional visual fields or with repeat assessment on the left. VII: Facial appears symmetric within the limitation of ETT obscuring full view. VIII: Unable to assess hearing due to patient's condition.  X, XI: Cough and gag intact, head is grossly midline XII: Patient does not protrude tongue to command but does have intermittent minimal tongue movements.  Motor: Patient has profound weakness.  RUE appears to extensor posture with application of noxious stimuli distally with nailbed pressure. He does not respond to more proximal stimulation.  LUE has minimal withdraw with distal application of noxious stimuli but does not respond to proximal noxious stimuli.    There is no movement noted of bilateral lower extremities to noxious stimuli. Tone is normal. Bulk is decreased throughout. Sensory: Grimaces with application of noxious stimuli in bilateral upper extremities. No response to noxious stimuli in BLE. With manipulation of patient throughout, BUE myoclonus increases.  Deep Tendon Reflexes: Unable to elicit patellar or achilles reflexes, brachioradialis intact in the upper extremities Plantars: Toes are mute bilaterally. Cerebellar: Unable to assess due to patient's condition.  Pertinent Labs: CBC  Component Value Date/Time   WBC 14.9 (H) 10/24/2020 0200   RBC 3.88 (L) 10/24/2020 0200   HGB 11.2 (L) 10/24/2020 0200   HGB 12.1 (L) 09/27/2017 1507   HCT  34.9 (L) 10/24/2020 0200   HCT 36.7 (L) 09/27/2017 1507   PLT 299 10/24/2020 0200   PLT 173 09/27/2017 1507   MCV 89.9 10/24/2020 0200   MCV 91 09/27/2017 1507   MCH 28.9 10/24/2020 0200   MCHC 32.1 10/24/2020 0200   RDW 14.5 10/24/2020 0200   RDW 14.0 09/27/2017 1507   LYMPHSABS 1.0 10/23/2020 0115   LYMPHSABS 1.7 09/27/2017 1507   MONOABS 0.5 10/23/2020 0115   EOSABS 0.0 10/23/2020 0115   EOSABS 0.1 09/27/2017 1507   BASOSABS 0.0 10/23/2020 0115   BASOSABS 0.0 09/27/2017 1507   CMP     Component Value Date/Time   NA 141 10/24/2020 0200   NA 139 09/27/2017 1507   K 4.7 10/24/2020 0200   CL 106 10/24/2020 0200   CO2 23 10/24/2020 0200   GLUCOSE 209 (H) 10/24/2020 0200   BUN 66 (H) 10/24/2020 0200   BUN 23 09/27/2017 1507   CREATININE 1.84 (H) 10/24/2020 0200   CALCIUM 8.5 (L) 10/24/2020 0200   PROT 5.3 (L) 10/23/2020 0115   PROT 6.2 09/27/2017 1507   ALBUMIN 2.3 (L) 10/23/2020 0115   ALBUMIN 4.1 09/27/2017 1507   AST 19 10/23/2020 0115   ALT 19 10/23/2020 0115   ALKPHOS 57 10/23/2020 0115   BILITOT 0.9 10/23/2020 0115   BILITOT 0.2 09/27/2017 1507   GFRNONAA 40 (L) 10/24/2020 0200   GFRAA 46 (L) 04/01/2019 0836   Lab Results  Component Value Date   VITAMINB12 1,372 (H) 10/23/2020   Lab Results  Component Value Date   TSH 2.249 10/23/2020   Ammonia 10/8: 18 RPR: pending HIV ab: Non Reactive  Imaging Reviewed:  MRI brain 10/23/2020: Prominent susceptibility artifact arising from a left-sided cochlear implant, significantly limiting evaluation, as detailed. Within described limitations, there is no appreciable acute intracranial abnormality. Mild-to-moderate generalized cerebral atrophy. Comparatively mild cerebellar atrophy.  CT Head 10/21/2020: Limited evaluation due to beam hardening artifact from left cochlear implant. Within this limitation, no acute intracranial process.  MRI cervical spine 10/23/2020: Motion degraded examination, limiting  evaluation. Within the limitations of motion degradation, no spinal cord signal abnormality is identified. Cervical spondylosis, as outlined. No more than mild spinal canal narrowing (without spinal cord mass effect). Multilevel foraminal stenosis, as detailed and greatest on the right at C3-C4 (severe), bilaterally at C4-C5 (moderate right, moderate/severe left) and on the left at C5-C6 (moderate). Straightening of the expected cervical lordosis. Minimal degenerative endplate edema at L3-Y1.  MRI thoracic spine 10/23/2020: Motion degraded examination, limiting evaluation. Within the limitations of motion degradation, no signal abnormality is identified the spinal cord at the thoracic levels. Thoracic spondylosis, as outlined. There are levels where shallow disc bulges and small disc protrusions minimally efface the ventral thecal sac (without significant spinal cord mass effect). No significant foraminal stenosis. Thoracic levocurvature. Exaggerated thoracic kyphosis. Unchanged mild chronic anterior wedge deformity of the T12 vertebral body. Trace bilateral pleural effusions.  Assessment:  65 y.o. male who presented for evaluation of unresponsiveness and respiratory failure on 10/4 with waxing and waning mental status with perseveration, global weakness, and variable extremity weakness versus left-sided neglect on examination.  - Examination reveals intubated patient with global weakness, lower extremities > upper extremities, worse today than yesterday's exam. Patient does not have movement of BLE and  has minimal L > R motor response to noxious stimuli of bilateral upper extremities.  - MRI brain was attempted but due to patient's cochlear implant, unable to be obtained. CT head was also limited due to beam hardening artifact from left cochlear implant but within that limitation, there is no acute intracranial process identified. MRI cervical spine was obtained revealing no spinal cord signal  abnormality within the limitations of motion degradation.  - Patient's presentation is consistent with acute encephalopathy versus delirium with waxing and waning mental status, varying locations and degrees of weakness, and worsening with increased hypertension. Etiology may be secondary to toxic metabolic or infectious derangements, hypoxia with respiratory failure, or multiple recent hospitalizations and sedating medications as well as cefepime which has significant neurological effects. Patient has been afebrile but does have a leukocytosis with a WBC count of 14.8. Blood cultures and respiratory cultures pending. It is less likely that his presentation is due to an acute stroke with varying degrees of weakness and perseveration, though it cannot be ruled out with reports of perseveration and slurred speech when prior to extubation yesterday. Unfortunately, MRI brain is severely limited and non-diagnostic due to significant artifact interference from left cochlear implant.  It is also possible that his presentation is multifactorial with a combination of those listed above.   Recommendations: - Routine EEG pending - MMA, vitamin B1 pending - High dose thiamine 500 mg TID for 3 days followed by 100 mg IV daily  - Though patient is on low dose Depakote, will ensure that he is not toxic with VPA level in the AM - Neurology will follow up on EEG once complete   Anibal Henderson, AGACNP-BC [facility] [phone]  Attending Neurologist's note:  Discussed with Dr. Loanne Drilling, who notes that the patient's respiratory failure seems somewhat atypical and his weakness seems out of proportion to his baseline, noting that while he was hypercarbic on initial, he was not hypercarbic for the second reintubation though there was a question of aspiration event at the time.  On additional review of chart on 05/10/2020 she was seen by NP Volanda Napoleon of pulmonology who did that his LDCT scan on 03/02/2020  demonstrated a nodule planned for follow-up in May/June.  Follow-up scan completed on 06/11/2020 showed an additional nodule with recommendation to follow-up again in 3 months, not yet completed.  I am concerned that underlying malignancy may be explaining his worsening weakness in this setting particularly.  EEG today demonstrates  Additional recommendations after completion of EEG which demonstrated: ABNORMALITY - Periodic discharges with triphasic morphology, generalized ( GPDs) - Continuous slow, generalized IMPRESSION: This study showed generalized periodic discharges with triphasic morphology at 1.5-2 Hz which is on the ictal-interictal continuum with low potential for seizures. This eeg pattern can also been seen due toxic-metabolic causes. Additionally there is moderate to severe diffuse encephalopathy, nonspecific etiology. No seizures were seen throughout the recording.  06/11/2020 LDCT chest:  Lung-RADS 4A, suspicious. Follow up low-dose chest CT without contrast in 3 months (please use the following order, "CT CHEST LCS NODULE FOLLOW-UP W/O CM") is recommended. New peribronchovascular nodularity in the right lower lobe, measuring up to 10.0 mm, favoring infection. However, given size, this warrants attention on follow-up. Prior dominant nodule in the left upper lobe is unchanged, favoring a benign etiology.  -Consider repeat chest imaging for malignancy as well as possible aspiration, defer to primary team on exact scan to order.  -Suspect EEG pattern is secondary to cefepime, which can take 2 to 3  days clear.  If patient's mental status remains poor tomorrow, please reach out to neurology for consideration of 1 day of long-term EEG to more definitively rule out seizures  I personally saw this patient, gathering history, performing a full neurologic examination, reviewing relevant labs, personally reviewing relevant CNS imaging, and formulated the assessment and plan, adding the note  above for completeness and clarity to accurately reflect my thoughts  CRITICAL CARE Performed by: Lorenza Chick   Total critical care time: 50 minutes  Critical care time was exclusive of separately billable procedures and treating other patients.  Critical care was necessary to treat or prevent imminent or life-threatening deterioration.  Critical care was time spent personally by me on the following activities: development of treatment plan with patient and/or surrogate as well as nursing, discussions with consultants, evaluation of patient's response to treatment, examination of patient, obtaining history from patient or surrogate, ordering and performing treatments and interventions, ordering and review of laboratory studies, ordering and review of radiographic studies, pulse oximetry and re-evaluation of patient's condition.

## 2020-10-24 NOTE — Progress Notes (Signed)
EEG complete - results pending 

## 2020-10-24 NOTE — Progress Notes (Addendum)
NAME:  [name], MRN:  [id number], DOB:  1955/08/20, LOS: 5 ADMISSION DATE:  10/24/2020, CONSULTATION DATE:  10/4 REFERRING MD:  Gilford Raid, CHIEF COMPLAINT:  Hypercarbic Respiratory Failure   History of Present Illness:  All information from medical records as patient is intubated and sedated.   65 year old male current every day smoker with history of asthma, severe COPD with chronic hypoxemia on 3.5 L during the day, 3 L at night, type 2 diabetes, CKD stage IIIb, PVD and hypertension presents to the ED 10/4  with AMS.  Pt was admitted 1 week prior to this admission week (9/26-29) for COPD exacerbation and sent to [facility] for rehab on 10/14/2020 with prednisone taper and 3 day course of augementin. He was apparently normal at 1000 on the day of admission, but was noticed to have increased WOB at 1100.  Pt was given 1x  nebulizer treatment by the facility and EMS was called.  When EMS arrived, he was found to be unresponsive and required BVM with oral airway.  After he was bagged for several minutes, he started to wake up some and he was put on a 15 L NRB. Pt was given 20 mg albuterol, 125 mg solumedrol, and 2 g magnesium en route. In the ED he was given a BiPAP trial, which he failed, ( ABG after BiPAP was 7.23, 78.7/74/ bicarb 33.7/ sat of 91%) and patient was intubated in the ED. After intubation, patient became hypotensive and bradycardic. He received atropine 1mg  x 1 and 2L of LR. PCCM have been called to admit to ICU and manage care. On the unit patient remained hypotensive but responded appropriately to additional fluid bolus.   Of note, patient does have a BiPAP machine that he does not wear per his wife.  It was not with him at rehab.   Pt. can not be discharged to rehab without BiPAP in the future.   Pertinent  Medical History    Past Medical History:  Diagnosis Date   Anxiety    Arthritis    Chronic narcotic use    Complication of anesthesia    h/o aspiration during back surgery    Depression    Diabetic peripheral neuropathy (HCC)    Hearing loss    wears bilateral hearing aids, bilat moderate SNHL   Hyperlipidemia    Hypertension    Insomnia    Tobacco abuse    Type 1 diabetes mellitus (HCC)    Ulcer of left ankle (HCC)    Vision abnormalities    mild DM retinopathy     Significant Hospital Events: Including procedures, antibiotic start and stop dates in addition to other pertinent events   Recent Hospitalization  (9/26-29) for COPD exacerbation , discharged to [facility] without BiPAP 11/04/2020 Admission to [place] with AMS, found to be hypercarbic failed BiPAP trial intubated, Ceftriaxone 10/4, azithromycin 10/4  10/20/2020 transitioned to cefepime for HCAP coverage. Failed SBT due to hypoxic, tachypnea, agitation  Extubated 10/6, to 6 L , BiPAP overnight  10/8 - Intubated 10/9 - New diffuse weakness. Neurology consulted. MRI brain and spine - neg. However limited exam due to cochlear implant  Interim History / Subjective:  Yesterday patient noted to have new diffuse weakness. Neurology consulted. MRI brain and spine - neg. However limited exam due to cochlear implant  Objective   Blood pressure (!) 174/61, pulse 98, temperature 99 F (37.2 C), temperature source Oral, resp. rate 16, height 5\' 9"  (1.753 m), weight 61.2  kg, SpO2 94 %.    Vent Mode: PSV;CPAP FiO2 (%):  [40 %-50 %] 40 % Set Rate:  [18 bmp] 18 bmp Vt Set:  [520 mL] 520 mL PEEP:  [5 cmH20] 5 cmH20 Pressure Support:  [10 cmH20] 10 cmH20 Plateau Pressure:  [12 cmH20-16 cmH20] 14 cmH20   Intake/Output Summary (Last 24 hours) at 10/24/2020 0817 Last data filed at 10/24/2020 0600 Gross per 24 hour  Intake 1642.39 ml  Output 400 ml  Net 1242.39 ml   Filed Weights   10/21/20 0416 10/22/20 0500 10/23/20 0500  Weight: 65.7 kg 64.3 kg 61.2 kg   Physical Exam: General: Critically ill-appearing, no acute distress, encephalopathic HENT: , AT, ETT in place Eyes: EOMI, no scleral  icterus Respiratory: Diminished breath sounds bilaterally.  No crackles, wheezing or rales Cardiovascular: RRR, -M/R/G, no JVD GI: BS+, soft, nontender Extremities:-Edema,-tenderness Neuro: Eyes open, intermittently follows commands including sticking tongue out. No movement seen in upper or lower extremities  Trach aspirate 10/7 - pending Bcx 10/4 - pending     Resolved Hospital Problem list   Peri-intubation shock, lactic acidosis  Assessment & Plan:   Acute metabolic encephalopathy exacerbated by hypertension ICU delirium - hearing aid and glasses in place CT head 10/6 neg. MRI non-diagnostic d/t cochlear implant. MRI C-and T-spine neg. Cefepime Dc'd 10/7  Neurology following. Appreciate input. Frequent re-orientation  Acute on Chronic Hypercarbic Respiratory Failure in setting of AECOPD and RLL PNA  -on 3.5L day, 3L HS COPD and asthma Current every day smoker Extubated 10/6, reintubated 10/8 Plan -possible RLL consolidation v atelectasis on CXR this AM.  -Known OSA and has been non adherent with his BIPAP which could also be contributing.  -Will need BiPAP ( that he has at home ) at rehab facility once discharged -Has refused BiPAP post-extubation 10/7 worsening respiratory distress. Had emesis on BiPAP overnight and was re-intubated -Completed 5 d steroids and azithro Plan: Full vent support. Wean FIO2/PEEP for goal SpO2 88-95% Extubation precluded by weakness Continue ceftriaxone for 7 days CXR prn  Scheduled Pulmicort and DuoNebs, PRN albuterol  Bradycardia secondary to sedation DC Precedex 10/8 PAD protocol with Fentanyl for RASS goal 0  Hypertension Wean cardene for SBP goal <180 On home amlodipine and metoprolol  T2DM Wears Freestyle Libre at home  Received 18u of ssi ON, on levemir 20u BID Plan: CBG Q4  Continue levemir + SSI  CKD stage IIIb, slightly worsening Unclear BL sCr though 1.5-1.7 seems to be where he is typically.  Good  UOP Plan: Monitor I&O Trend BMET Replete electrolytes as needed  Avoid nephrotoxic medications , maintain renal perfusion  Depression  Plan Depakote 250 daily  Restart home Zoloft and Sinequan   Chronic Pain  Plan Restart home Neurontin to assist with sedation weaning  On home MS Contin 15 mg BID. Will add scheduled oxycodone to help with weaning of sedation.   Nutrition  Plan TF per dietitian Cor Trac placement 10/7   Abnormal Low Dose CT 05/2020> 4A > suspected infectious/ inflammatory Due for Follow up 08/2020 , not yet done Every day smoker 1- 1.5 per day prior to hospitalization 10/11/2020 40 + pack year smoking history Plan Needs smoking cessation counseling  Best Practice (right click and "Reselect all SmartList Selections" daily)   Diet/type: tubefeeds DVT prophylaxis: prophylactic heparin  GI prophylaxis: PPI Lines: N/A Foley:  Yes, and it is still needed Code Status:  full code Last date of multidisciplinary goals of care discussion : Wife updated  10/8, with plan of care.  She is caring for a toddler, and she is only able to come to visit MWF 9 am - 1 pm, while the child is in daycare.  [place] , [phone] ( Wife) Daughter Maple Mirza [phone]  Palliative consulted  Labs   Labs reviewed. Improved but persistent leukocytosis ~14. Stable anemia. Cr worsening with increased BUN  CBC: Recent Labs  Lab 10/16/2020 1236 10/16/2020 1321 10/20/20 0441 10/21/20 0930 10/21/20 1000 10/22/20 0249 10/22/20 1424 10/22/20 2125 10/23/20 0115 10/24/20 0200  WBC 26.1*   < > 25.4*  --  20.5* 9.4  --  18.7* 14.8* 14.9*  NEUTROABS 22.8*  --  22.4*  --   --   --   --   --  13.1*  --   HGB 14.4   < > 11.7*   < > 11.9* 10.3* 9.9* 11.9* 11.1* 11.2*  HCT 44.5   < > 36.0*   < > 37.2* 32.3* 29.0* 35.6* 34.8* 34.9*  MCV 89.2   < > 88.7  --  89.2 89.0  --  88.1 88.8 89.9  PLT 375   < > 204  --  193 176  --  292 275 299   < > = values in this interval not  displayed.    Basic Metabolic Panel: Recent Labs  Lab 10/20/20 0441 10/20/20 1955 10/21/20 0450 10/21/20 0930 10/21/20 1627 10/22/20 0249 10/22/20 1424 10/22/20 1652 10/22/20 2021 10/22/20 2125 10/23/20 0115 10/24/20 0200  NA  --   --  140   < >  --  138 139  --  139 138 140 141  K  --   --  5.3*   < >  --  4.8 4.5  --  4.5 4.5 4.4 4.7  CL  --   --  106   < >  --  102  --   --  103 101 102 106  CO2  --   --  16*   < >  --  24  --   --  25 23 26 23   GLUCOSE  --   --  72   < >  --  235*  --   --  203* 196* 207* 209*  BUN  --   --  55*   < >  --  53*  --   --  52* 59* 60* 66*  CREATININE  --   --  1.86*   < >  --  1.69*  --   --  1.69* 1.69* 1.71* 1.84*  CALCIUM  --   --  8.3*   < >  --  8.2*  --   --  8.3* 8.7* 8.6* 8.5*  MG 1.9   < > 2.5*  --  2.3 2.1  --   --  2.1  --  2.0  --   PHOS 4.8*  --  4.5  --  4.6 4.8*  --  4.4  --   --   --   --    < > = values in this interval not displayed.   GFR: Estimated Creatinine Clearance: 34.2 mL/min (A) (by C-G formula based on SCr of 1.84 mg/dL (H)). Recent Labs  Lab 10/31/2020 1509 10/21/2020 1651 10/20/20 1211 10/20/20 1336 10/20/20 1955 10/21/20 1000 10/22/20 0249 10/22/20 2125 10/23/20 0115 10/24/20 0200  PROCALCITON 0.58  --  3.30  --   --   --   --   --   --   --  WBC 14.0*   < >  --   --   --  20.5* 9.4 18.7* 14.8* 14.9*  LATICACIDVEN  --    < >  --  3.4* 3.0* 2.3*  --   --  1.1  --    < > = values in this interval not displayed.    Liver Function Tests: Recent Labs  Lab 10/20/20 0441 10/23/20 0115  AST 15 19  ALT 19 19  ALKPHOS 54 57  BILITOT 0.3 0.9  PROT 5.0* 5.3*  ALBUMIN 2.4* 2.3*   CBG: Recent Labs  Lab 10/23/20 1525 10/23/20 1913 10/23/20 2303 10/24/20 0307 10/24/20 0715  GLUCAP 274* 221* 177* 179* 167*    The patient is critically ill with multiple organ systems failure and requires high complexity decision making for assessment and support, frequent evaluation and titration of therapies,  application of advanced monitoring technologies and extensive interpretation of multiple databases.  Independent Critical Care Time: 42 Minutes.   Rodman Pickle, M.D. [facility] Pulmonary/Critical Care Medicine 10/24/2020 8:18 AM   Please see Amion for pager number to reach on-call Pulmonary and Critical Care Team.

## 2020-10-24 NOTE — Progress Notes (Signed)
SLP Cancellation Note  Patient Details Name: [name] MRN: [id number] DOB: 04/03/1955   Cancelled treatment:       Reason Eval/Treat Not Completed: Medical issues which prohibited therapy (Pt remains intubated at this time. SLP will follow up.)  Kurtis Anastasia I. Hardin Negus, [place], [place] Office number [phone] Pager [place] 10/24/2020, 11:01 AM

## 2020-10-25 ENCOUNTER — Inpatient Hospital Stay (HOSPITAL_COMMUNITY): Payer: Medicare Other

## 2020-10-25 DIAGNOSIS — W19XXXA Unspecified fall, initial encounter: Secondary | ICD-10-CM

## 2020-10-25 DIAGNOSIS — J9602 Acute respiratory failure with hypercapnia: Secondary | ICD-10-CM | POA: Diagnosis not present

## 2020-10-25 DIAGNOSIS — Z7189 Other specified counseling: Secondary | ICD-10-CM

## 2020-10-25 DIAGNOSIS — Z515 Encounter for palliative care: Secondary | ICD-10-CM

## 2020-10-25 DIAGNOSIS — Z66 Do not resuscitate: Secondary | ICD-10-CM

## 2020-10-25 DIAGNOSIS — Z978 Presence of other specified devices: Secondary | ICD-10-CM | POA: Diagnosis not present

## 2020-10-25 DIAGNOSIS — J441 Chronic obstructive pulmonary disease with (acute) exacerbation: Secondary | ICD-10-CM | POA: Diagnosis not present

## 2020-10-25 DIAGNOSIS — J9622 Acute and chronic respiratory failure with hypercapnia: Secondary | ICD-10-CM | POA: Diagnosis not present

## 2020-10-25 LAB — BASIC METABOLIC PANEL
Anion gap: 11 (ref 5–15)
BUN: 67 mg/dL — ABNORMAL HIGH (ref 8–23)
CO2: 25 mmol/L (ref 22–32)
Calcium: 8.4 mg/dL — ABNORMAL LOW (ref 8.9–10.3)
Chloride: 109 mmol/L (ref 98–111)
Creatinine, Ser: 1.92 mg/dL — ABNORMAL HIGH (ref 0.61–1.24)
GFR, Estimated: 38 mL/min — ABNORMAL LOW (ref >60)
Glucose, Bld: 162 mg/dL — ABNORMAL HIGH (ref 70–99)
Potassium: 4.5 mmol/L (ref 3.5–5.1)
Sodium: 145 mmol/L (ref 135–145)

## 2020-10-25 LAB — MAGNESIUM: Magnesium: 2.2 mg/dL (ref 1.7–2.4)

## 2020-10-25 LAB — GLUCOSE, CAPILLARY
Glucose-Capillary: 124 mg/dL — ABNORMAL HIGH (ref 70–99)
Glucose-Capillary: 117 mg/dL — ABNORMAL HIGH (ref 70–99)
Glucose-Capillary: 149 mg/dL — ABNORMAL HIGH (ref 70–99)
Glucose-Capillary: 165 mg/dL — ABNORMAL HIGH (ref 70–99)
Glucose-Capillary: 155 mg/dL — ABNORMAL HIGH (ref 70–99)
Glucose-Capillary: 191 mg/dL — ABNORMAL HIGH (ref 70–99)

## 2020-10-25 LAB — VALPROIC ACID LEVEL: Valproic Acid Lvl: <10 ug/mL — ABNORMAL LOW (ref 50.0–100.0)

## 2020-10-25 LAB — METHYLMALONIC ACID, SERUM: Methylmalonic Acid, Quantitative: 351 nmol/L (ref 0–378)

## 2020-10-25 LAB — PHOSPHORUS: Phosphorus: 4.3 mg/dL (ref 2.5–4.6)

## 2020-10-25 MED ORDER — CARVEDILOL 3.125 MG PO TABS
3.1250 mg | ORAL_TABLET | Freq: Two times a day (BID) | ORAL | Status: DC
  Administered 2020-10-25 – 2020-10-26 (×2): 3.125 mg
  Filled 2020-10-25 (×2): qty 1

## 2020-10-25 MED ORDER — GLUCERNA 1.2 CAL PO LIQD
1000.0000 mL | ORAL | Status: DC
  Administered 2020-10-25 – 2020-10-26 (×2): 1000 mL
  Filled 2020-10-25 (×4): qty 1000

## 2020-10-25 MED ORDER — CLONIDINE HCL 0.2 MG PO TABS
0.1000 mg | ORAL_TABLET | Freq: Two times a day (BID) | ORAL | Status: DC
  Administered 2020-10-25 – 2020-10-27 (×5): 0.1 mg via ORAL
  Filled 2020-10-25 (×5): qty 1

## 2020-10-25 NOTE — Procedures (Signed)
Cortrak  Person Inserting Tube:  Audria Takeshita, Creola Corn, RD Tube Type:  Cortrak - 43 inches Tube Size:  10 Tube Location:  Left nare Initial Placement:  Stomach Secured by: Bridle Technique Used to Measure Tube Placement:  Marking at nare/corner of mouth Cortrak Secured At:  67 cm  Cortrak Tube Team Note:  Consult received to place a Cortrak feeding tube.   X-ray is required, abdominal x-ray has been ordered by the Cortrak team. Please confirm tube placement before using the Cortrak tube.   If the tube becomes dislodged please keep the tube and contact the Cortrak team at [email or web address] ([email or web address]) for replacement.  If after hours and replacement cannot be delayed, place a NG tube and confirm placement with an abdominal x-ray.    Larkin Ina, MS, RD, LDN (she/her/hers) RD pager number and weekend/on-call pager number located in [place].
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
## 2020-10-25 NOTE — Progress Notes (Signed)
NAME:  [name], MRN:  [id number], DOB:  10-30-1955, LOS: 6 ADMISSION DATE:  11/14/2020, CONSULTATION DATE:  10/4 REFERRING MD:  Gilford Raid, CHIEF COMPLAINT:  Hypercarbic Respiratory Failure   History of Present Illness:  All information from medical records as patient is intubated and sedated.   65 year old male current every day smoker with history of asthma, severe COPD with chronic hypoxemia on 3.5 L during the day, 3 L at night, type 2 diabetes, CKD stage IIIb, PVD and hypertension presents to the ED 10/4  with AMS.  Pt was admitted 1 week prior to this admission week (9/26-29) for COPD exacerbation and sent to [facility] for rehab on 10/14/2020 with prednisone taper and 3 day course of augementin. He was apparently normal at 1000 on the day of admission, but was noticed to have increased WOB at 1100.  Pt was given 1x  nebulizer treatment by the facility and EMS was called.  When EMS arrived, he was found to be unresponsive and required BVM with oral airway.  After he was bagged for several minutes, he started to wake up some and he was put on a 15 L NRB. Pt was given 20 mg albuterol, 125 mg solumedrol, and 2 g magnesium en route. In the ED he was given a BiPAP trial, which he failed, ( ABG after BiPAP was 7.23, 78.7/74/ bicarb 33.7/ sat of 91%) and patient was intubated in the ED. After intubation, patient became hypotensive and bradycardic. He received atropine 1mg  x 1 and 2L of LR. PCCM have been called to admit to ICU and manage care. On the unit patient remained hypotensive but responded appropriately to additional fluid bolus.   Of note, patient does have a BiPAP machine that he does not wear per his wife.  It was not with him at rehab.   Pt. can not be discharged to rehab without BiPAP in the future.   Pertinent  Medical History    Past Medical History:  Diagnosis Date   Anxiety    Arthritis    Chronic narcotic use    Complication of anesthesia    h/o aspiration during back surgery    Depression    Diabetic peripheral neuropathy (HCC)    Hearing loss    wears bilateral hearing aids, bilat moderate SNHL   Hyperlipidemia    Hypertension    Insomnia    Tobacco abuse    Type 1 diabetes mellitus (HCC)    Ulcer of left ankle (HCC)    Vision abnormalities    mild DM retinopathy     Significant Hospital Events: Including procedures, antibiotic start and stop dates in addition to other pertinent events   Recent Hospitalization  (9/26-29) for COPD exacerbation , discharged to [facility] without BiPAP 10/18/2020 Admission to [facility] with AMS, found to be hypercarbic failed BiPAP trial intubated, Ceftriaxone 10/4, azithromycin 10/4  10/20/2020 transitioned to cefepime for HCAP coverage. Failed SBT due to hypoxic, tachypnea, agitation  Extubated 10/6, to 6 L , BiPAP overnight  10/8 - Intubated 10/9 - New diffuse weakness. Neurology consulted. MRI brain and spine - neg. However limited exam due to cochlear implant  Interim History / Subjective:  10/10: remains intubated. Alert following commands. Eeg yesterday with [employer]'s  Objective   Blood pressure (!) 156/65, pulse 90, temperature 99.1 F (37.3 C), temperature source Oral, resp. rate 19, height 5\' 9"  (1.753 m), weight 62.1 kg, SpO2 98 %.    Vent Mode: PRVC FiO2 (%):  [  40 %] 40 % Set Rate:  [18 bmp] 18 bmp Vt Set:  [520 mL] 520 mL PEEP:  [5 cmH20] 5 cmH20 Pressure Support:  [10 cmH20] 10 cmH20 Plateau Pressure:  [11 cmH20-15 cmH20] 15 cmH20   Intake/Output Summary (Last 24 hours) at 10/25/2020 1044 Last data filed at 10/25/2020 0900 Gross per 24 hour  Intake 1653.03 ml  Output 845 ml  Net 808.03 ml   Filed Weights   10/22/20 0500 10/23/20 0500 10/25/20 0600  Weight: 64.3 kg 61.2 kg 62.1 kg   Physical Exam: General: Critically ill-appearing, no acute distress, encephalopathic HENT: , AT, ETT in place Eyes: EOMI, no scleral icterus Respiratory: Diminished breath sounds bilaterally.  No crackles, wheezing or  rales Cardiovascular: RRR, -M/R/G, no JVD GI: BS+, soft, nontender Extremities:-Edema,-tenderness Neuro: Eyes open, intermittently follows commands including sticking tongue out. No movement seen in upper or lower extremities  Trach aspirate 10/7 -few candida Bcx 10/4 - negative 10/10: Cr 1.9   Resolved Hospital Problem list   Peri-intubation shock, lactic acidosis  Assessment & Plan:   Acute metabolic encephalopathy exacerbated by hypertension ICU delirium - hearing aid and glasses in place CT head 10/6 neg. MRI non-diagnostic d/t cochlear implant. MRI C-and T-spine neg. Cefepime Dc'd 10/7  Neurology following. Appreciate input. Frequent re-orientation  Acute on Chronic Hypercarbic Respiratory Failure in setting of AECOPD and RLL PNA  -on 3.5L day, 3L HS COPD and asthma Current every day smoker Extubated 10/6, reintubated 10/8 Plan -Known OSA and has been non adherent with his BIPAP which could also be contributing.  -Will need BiPAP ( that he has at home ) at rehab facility once discharged -Has refused BiPAP post-extubation 10/7 worsening respiratory distress. Had emesis on BiPAP overnight and was re-intubated -Completed 5 d steroids and azithro Plan: Full vent support. Wean FIO2/PEEP for goal SpO2 88-95% Extubation precluded by weakness Scheduled Pulmicort and DuoNebs, PRN albuterol  Bradycardia secondary to sedation PAD protocol with Fentanyl for RASS goal 0  Hypertension Wean cardene for SBP goal <180 -adding carvedilol -cont norvasc, hydral for now -stop metoprolol -titrate cardene  T2DM Wears Freestyle Libre at home  Received 18u of ssi ON, on levemir 20u BID Plan: CBG Q4  Continue levemir + SSI  CKD stage IIIb, slightly worsening Unclear BL sCr though 1.5-1.7 seems to be where he is typically.  Good UOP Plan: Monitor I&O Trend BMET Replete electrolytes as needed  Avoid nephrotoxic medications , maintain renal perfusion  Depression   Plan Depakote 250 daily  Restart home Zoloft and Sinequan   Chronic Pain  Plan Restart home Neurontin to assist with sedation weaning  On home MS Contin 15 mg BID. Will add scheduled oxycodone to help with weaning of sedation.   Nutrition  Plan TF per dietitian Cor Trac placement 10/7   Abnormal Low Dose CT 05/2020> 4A > suspected infectious/ inflammatory Due for Follow up 08/2020 , not yet done Every day smoker 1- 1.5 per day prior to hospitalization 10/11/2020 40 + pack year smoking history Plan Needs smoking cessation counseling  Best Practice (right click and "Reselect all SmartList Selections" daily)   Diet/type: tubefeeds DVT prophylaxis: prophylactic heparin  GI prophylaxis: PPI Lines: N/A Foley:  Yes, and it is still needed Code Status:  full code Last date of multidisciplinary goals of care discussion : Wife updated 10/8, with plan of care.  She is caring for a toddler, and she is only able to come to visit MWF 9 am - 1 pm, while  the child is in daycare.  [place] , [phone] ( Wife) Daughter Maple Mirza [phone]  Palliative consulted  Labs     CBC: Recent Labs  Lab 10/18/2020 1236 11/03/2020 1321 10/20/20 0441 10/21/20 0930 10/21/20 1000 10/22/20 0249 10/22/20 1424 10/22/20 2125 10/23/20 0115 10/24/20 0200  WBC 26.1*   < > 25.4*  --  20.5* 9.4  --  18.7* 14.8* 14.9*  NEUTROABS 22.8*  --  22.4*  --   --   --   --   --  13.1*  --   HGB 14.4   < > 11.7*   < > 11.9* 10.3* 9.9* 11.9* 11.1* 11.2*  HCT 44.5   < > 36.0*   < > 37.2* 32.3* 29.0* 35.6* 34.8* 34.9*  MCV 89.2   < > 88.7  --  89.2 89.0  --  88.1 88.8 89.9  PLT 375   < > 204  --  193 176  --  292 275 299   < > = values in this interval not displayed.    Basic Metabolic Panel: Recent Labs  Lab 10/20/20 0441 10/20/20 1955 10/21/20 0450 10/21/20 0930 10/21/20 1627 10/22/20 0249 10/22/20 1424 10/22/20 1652 10/22/20 2021 10/22/20 2125 10/23/20 0115 10/24/20 0200  10/25/20 0118  NA  --   --  140   < >  --  138   < >  --  139 138 140 141 145  K  --   --  5.3*   < >  --  4.8   < >  --  4.5 4.5 4.4 4.7 4.5  CL  --   --  106   < >  --  102  --   --  103 101 102 106 109  CO2  --   --  16*   < >  --  24  --   --  25 23 26 23 25   GLUCOSE  --   --  72   < >  --  235*  --   --  203* 196* 207* 209* 162*  BUN  --   --  55*   < >  --  53*  --   --  52* 59* 60* 66* 67*  CREATININE  --   --  1.86*   < >  --  1.69*  --   --  1.69* 1.69* 1.71* 1.84* 1.92*  CALCIUM  --   --  8.3*   < >  --  8.2*  --   --  8.3* 8.7* 8.6* 8.5* 8.4*  MG 1.9   < > 2.5*  --  2.3 2.1  --   --  2.1  --  2.0  --   --   PHOS 4.8*  --  4.5  --  4.6 4.8*  --  4.4  --   --   --   --   --    < > = values in this interval not displayed.   GFR: Estimated Creatinine Clearance: 33.2 mL/min (A) (by C-G formula based on SCr of 1.92 mg/dL (H)). Recent Labs  Lab 11/13/2020 1509 10/25/2020 1651 10/20/20 1211 10/20/20 1336 10/20/20 1955 10/21/20 1000 10/22/20 0249 10/22/20 2125 10/23/20 0115 10/24/20 0200  PROCALCITON 0.58  --  3.30  --   --   --   --   --   --   --   WBC 14.0*   < >  --   --   --  20.5* 9.4 18.7* 14.8* 14.9*  LATICACIDVEN  --    < >  --  3.4* 3.0* 2.3*  --   --  1.1  --    < > = values in this interval not displayed.    Liver Function Tests: Recent Labs  Lab 10/20/20 0441 10/23/20 0115  AST 15 19  ALT 19 19  ALKPHOS 54 57  BILITOT 0.3 0.9  PROT 5.0* 5.3*  ALBUMIN 2.4* 2.3*   CBG: Recent Labs  Lab 10/24/20 1527 10/24/20 1919 10/24/20 2354 10/25/20 0409 10/25/20 0723  GLUCAP 140* 143* 132* 124* 117*    Critical care time: The patient is critically ill with multiple organ systems failure and requires high complexity decision making for assessment and support, frequent evaluation and titration of therapies, application of advanced monitoring technologies and extensive interpretation of multiple databases.  Critical care time 35 mins. This represents my time  independent of the NPs time taking care of the pt. This is excluding procedures.    Audria Nine DO [facility] 10/25/2020, 10:44 AM See Amion for pager If no response to pager, please call [phone] until 1900 After 1900 please call [facility] [phone]

## 2020-10-25 NOTE — Progress Notes (Signed)
Nutrition Follow-up  DOCUMENTATION CODES:   Severe malnutrition in context of chronic illness  INTERVENTION:   Tube feeding via Cortrak tube: Glucerna 1.2 at 25 ml/h, increase by 10 ml every 8 hours to goal rate of 55 ml/h (1320 ml per day).  Provides 1664 kcal, 101 gm protein, 1063 ml free water daily.  Continue MVI with minerals 1 tablet daily, crushed and given via tube.  Monitor magnesium, potassium, and phosphorus BID for at least 3 days, MD to replete as needed, as pt is at risk for refeeding syndrome given severe malnutrition and minimal intake since admission.  NUTRITION DIAGNOSIS:   Severe Malnutrition related to chronic illness (COPD) as evidenced by severe muscle depletion, severe fat depletion.  Ongoing  GOAL:   Patient will meet greater than or equal to 90% of their needs  Progressing  MONITOR:   Vent status, Labs, TF tolerance, Skin  REASON FOR ASSESSMENT:   Ventilator, Consult Enteral/tube feeding initiation and management  ASSESSMENT:   65 yo male admitted with acute on chronic respiratory failure after being found down at his rehab facility. PMH includes severe COPD, active smoker, chronic respiratory failure, asthma, anxiety, arthritis, HLD, HTN, left ankle ulcer, chronic narcotic use, DM type 1, depression, hearing loss, cochlear implant, DM retinopathy.  Discussed patient in ICU rounds and with RN today. Cortrak placed 10/7, but patient required intubation later that day and the Cortrak got tangled with OG tube so it was pulled. Patient never received TF via Cortrak. Cortrak was replaced today, tip is gastric.  Plans to begin TF per RD orders today.   Given severe malnutrition and no intake since extubation on 10/6, patient is at risk for refeeding syndrome. Will need to monitor magnesium, potassium, phosphorus and supplement as needed. Will increase tube feeding slowly to goal rate.   Patient is currently intubated on ventilator support MV: 8.9  L/min Temp (24hrs), Avg:98.5 F (36.9 C), Min:98.1 F (36.7 C), Max:99.1 F (37.3 C)   Labs reviewed.  CBG: [phone]  Medications reviewed and include Colace, Novolog SSI, MVI with minerals, Protonix, Miralax, thiamine, nicardipine.  Admission weight 57.8 kg (10/4) Current weight 62.1 kg  I/O +6.95 L since admission UOP 800 ml x 24 hours  Diet Order:   Diet Order             Diet NPO time specified  Diet effective now                   EDUCATION NEEDS:   Not appropriate for education at this time  Skin:  Skin Assessment: Skin Integrity Issues: Skin Integrity Issues:: DTI DTI: sacrum  Last BM:  10/9 type 5  Height:   Ht Readings from Last 1 Encounters:  10/27/2020 5\' 9"  (1.753 m)    Weight:   Wt Readings from Last 1 Encounters:  10/25/20 62.1 kg    BMI:  Body mass index is 20.22 kg/m.  Estimated Nutritional Needs:   Kcal:  1635  Protein:  95-115 gm  Fluid:  >/= 1.85 L    Lucas Mallow, RD, LDN, CNSC Please refer to Amion for contact information.

## 2020-10-25 NOTE — Progress Notes (Signed)
SLP Cancellation Note  Patient Details Name: [name] MRN: [id number] DOB: 1955/01/25   Cancelled treatment:       Reason Eval/Treat Not Completed: Other (comment). Please reorder when needed, will sign off at this time.    Josh Nicolosi, Katherene Ponto 10/25/2020, 7:45 AM

## 2020-10-26 DIAGNOSIS — J9602 Acute respiratory failure with hypercapnia: Secondary | ICD-10-CM | POA: Diagnosis not present

## 2020-10-26 LAB — GLUCOSE, CAPILLARY
Glucose-Capillary: 130 mg/dL — ABNORMAL HIGH (ref 70–99)
Glucose-Capillary: 169 mg/dL — ABNORMAL HIGH (ref 70–99)
Glucose-Capillary: 171 mg/dL — ABNORMAL HIGH (ref 70–99)
Glucose-Capillary: 191 mg/dL — ABNORMAL HIGH (ref 70–99)
Glucose-Capillary: 197 mg/dL — ABNORMAL HIGH (ref 70–99)
Glucose-Capillary: 206 mg/dL — ABNORMAL HIGH (ref 70–99)
Glucose-Capillary: 208 mg/dL — ABNORMAL HIGH (ref 70–99)

## 2020-10-26 LAB — BASIC METABOLIC PANEL
Anion gap: 11 (ref 5–15)
BUN: 67 mg/dL — ABNORMAL HIGH (ref 8–23)
CO2: 24 mmol/L (ref 22–32)
Calcium: 8.8 mg/dL — ABNORMAL LOW (ref 8.9–10.3)
Chloride: 112 mmol/L — ABNORMAL HIGH (ref 98–111)
Creatinine, Ser: 1.9 mg/dL — ABNORMAL HIGH (ref 0.61–1.24)
GFR, Estimated: 38 mL/min — ABNORMAL LOW (ref >60)
Glucose, Bld: 191 mg/dL — ABNORMAL HIGH (ref 70–99)
Potassium: 5.3 mmol/L — ABNORMAL HIGH (ref 3.5–5.1)
Sodium: 147 mmol/L — ABNORMAL HIGH (ref 135–145)

## 2020-10-26 LAB — MAGNESIUM
Magnesium: 2.4 mg/dL (ref 1.7–2.4)
Magnesium: 2.1 mg/dL (ref 1.7–2.4)

## 2020-10-26 LAB — PHOSPHORUS
Phosphorus: 4.2 mg/dL (ref 2.5–4.6)
Phosphorus: 4.4 mg/dL (ref 2.5–4.6)

## 2020-10-26 LAB — VITAMIN B1: Vitamin B1 (Thiamine): 252.8 nmol/L — ABNORMAL HIGH (ref 66.5–200.0)

## 2020-10-26 MED ORDER — FUROSEMIDE 10 MG/ML IJ SOLN
40.0000 mg | Freq: Once | INTRAMUSCULAR | Status: AC
  Administered 2020-10-26: 40 mg via INTRAVENOUS
  Filled 2020-10-26: qty 4

## 2020-10-26 MED ORDER — CARVEDILOL 12.5 MG PO TABS
6.2500 mg | ORAL_TABLET | Freq: Two times a day (BID) | ORAL | Status: DC
  Administered 2020-10-26 – 2020-10-28 (×4): 6.25 mg
  Filled 2020-10-26 (×4): qty 1

## 2020-10-26 MED ORDER — CARVEDILOL 3.125 MG PO TABS
3.1250 mg | ORAL_TABLET | Freq: Once | ORAL | Status: AC
  Administered 2020-10-26: 3.125 mg
  Filled 2020-10-26 (×2): qty 1

## 2020-10-26 MED ORDER — SODIUM ZIRCONIUM CYCLOSILICATE 10 G PO PACK
10.0000 g | PACK | Freq: Two times a day (BID) | ORAL | Status: DC
  Administered 2020-10-26 – 2020-10-27 (×3): 10 g
  Filled 2020-10-26 (×3): qty 1

## 2020-10-26 NOTE — TOC Progression Note (Signed)
Transition of Care [facility]) - Initial/Assessment Note    Patient Details  Name: [name] MRN: [id number] Date of Birth: February 04, 1955  Transition of Care [facility]) CM/SW Contact:    Milinda Antis, [place] Phone Number: 10/26/2020, 4:13 PM  Clinical Narrative:                 CSW spoke with Star in admissions at [facility] (SNF).  The patient can return when he is medically ready.  Expected Discharge Plan: Skilled Nursing Facility Barriers to Discharge: Continued Medical Work up   Patient Goals and CMS Choice        Expected Discharge Plan and Services Expected Discharge Plan: [place]                                              Prior Living Arrangements/Services                       Activities of Daily Living      Permission Sought/Granted                  Emotional Assessment              Admission diagnosis:  COPD exacerbation ([place]) [J44.1] Acute respiratory failure with hypercapnia ([place]) [J96.02] Acute on chronic respiratory failure with hypercapnia ([place]) [J96.22] Patient Active Problem List   Diagnosis Date Noted   Endotracheally intubated    Protein-calorie malnutrition, severe 10/21/2020   Acute on chronic respiratory failure with hypercapnia ([place]) 11/05/2020   Acute respiratory failure with hypercapnia ([place]) 10/12/2020   Type 2 DM with CKD and hypertension 10/12/2020   COPD exacerbation ([place]) 10/12/2020   Rib fractures 03/10/2020   Closed fracture of proximal end of left humerus 03/10/2020   Normocytic anemia 03/10/2020   Thrombocytopenia ([place]) 03/10/2020   COPD, severe ([place]) 12/17/2019   Current smoker 12/17/2019   Acute encephalopathy 03/31/2019   AMS (altered mental status) 03/30/2019   Cellulitis of lower extremity 06/01/2018   Cellulitis of both lower extremities 05/31/2018   Chronic respiratory failure with hypoxia ([place]) 05/31/2018   HLD (hyperlipidemia) 05/31/2018   Acute renal failure  superimposed on stage 3b chronic kidney disease ([place]) 05/31/2018   Depression with anxiety 05/31/2018   Chronic pain 05/31/2018   Acute respiratory failure with hypoxia and hypercapnia ([place]) 04/14/2018   Diabetes mellitus type 1 with manifestations ([place]) 04/14/2018   Postop check 10/31/2017   Central sleep apnea 10/14/2017   Insomnia secondary to chronic pain 08/01/2017   Contact with and (suspected) exposure to environmental tobacco smoke (acute) (chronic) 08/01/2017   Snoring 08/01/2017   Venous insufficiency (chronic) (peripheral) 10/04/2016   Essential hypertension 08/01/2013   Chronic ulcer of left ankle ([place]) 08/01/2013   Type I (juvenile type) diabetes mellitus with neurological manifestations, not stated as uncontrolled(250.61) 10/03/2012   Healthcare-associated pneumonia 10/03/2012   Dyslipidemia 10/03/2012   Insomnia 10/03/2012   Pain in joint, lower leg 10/02/2012   Special screening for malignant neoplasms, colon 07/26/2010   PCP:  Leanna Battles, MD Pharmacy:   [id number] - [address], [place] [address] Phone: [phone] Fax: [phone]     Social Determinants of Health (SDOH) Interventions    Readmission Risk Interventions No flowsheet data found.

## 2020-10-26 NOTE — TOC Progression Note (Signed)
Transition of Care [facility]) - Progression Note    Patient Details  Name: [name] MRN: [id number] Date of Birth: 12-17-1955  Transition of Care [facility]) CM/SW Contact  Sharin Mons, RN Phone Number: 10/26/2020, 3:29 PM  Clinical Narrative:    Pt readmitted with ARF with hypercapnia. From [facility] SNF. Hoping to d/c back to [place] once medically ready.   Case discussed in LOS meeting with [facility] leadership.   TOC team will continue to monitor and assist with TOC needs.  Expected Discharge Plan: Skilled Nursing Facility Barriers to Discharge: Continued Medical Work up  Expected Discharge Plan and Services Expected Discharge Plan: [place]                                               Social Determinants of Health (SDOH) Interventions    Readmission Risk Interventions No flowsheet data found.

## 2020-10-26 NOTE — Progress Notes (Signed)
Neurology Progress Note  S: Does not feel well. His left hand is hurting (tubes were wrapped around hand). He denies any other pain. He is talking (whispering). Per RN, PCCM is weaning for extubation. Patient is breathing over the vent now.   O: Current vital signs: BP (!) 146/64 (BP Location: Right Arm)   Pulse 86   Temp 98.6 F (37 C) (Oral)   Resp 19   Ht _0  (1.753 m)   Wt 65.9 kg   SpO2 94%   BMI 21.45 kg/m  Vital signs in last 24 hours: Temp:  [98.3 F (36.8 C)-98.6 F (37 C)] 98.6 F (37 C) (10/11 1150) Pulse Rate:  [72-107] 86 (10/11 1200) Resp:  [17-27] 19 (10/11 1200) BP: (133-213)/(46-85) 146/64 (10/11 1200) SpO2:  [91 %-100 %] 94 % (10/11 1200) FiO2 (%):  [40 %] 40 % (10/11 1200) Weight:  [65.9 kg] 65.9 kg (10/11 0500)  GENERAL: Acutely ill, but not toxic appearing male lying in ICU.  Awake, alert in NAD. HEENT: Normocephalic and atraumatic. ETT in place.  LUNGS: on vent.  CV: RRR on tele.  Ext: warm.  NEURO:  Mental Status: Alert. Follows commands.  Speech/Language: Whispers over ETT. Comprehension intact.  Moves his LUE spontaneously but only lifts at elbow. RUE some movement against gravity. Wiggles his toes but unable to lift LEs off bed.   Medications  Current Facility-Administered Medications:    0.9 %  sodium chloride infusion, , Intravenous, PRN, Margaretha Seeds, MD, Stopped at 10/25/20 2220   albuterol (PROVENTIL) (2.5 MG/3ML) 0.083% nebulizer solution 2.5 mg, 2.5 mg, Nebulization, Q4H PRN, Magdalen Spatz, NP, 2.5 mg at 10/21/20 2235   amLODipine (NORVASC) tablet 10 mg, 10 mg, Per Tube, Daily, Freddi Starr, MD, 10 mg at 10/26/20 1004   aspirin chewable tablet 81 mg, 81 mg, Per Tube, Daily, Omar Person, NP, 81 mg at 10/26/20 1005   bisacodyl (DULCOLAX) suppository 10 mg, 10 mg, Rectal, Daily PRN, Magdalen Spatz, NP   budesonide (PULMICORT) nebulizer solution 0.25 mg, 0.25 mg, Nebulization, BID, Elie Confer, Sarah F, NP, 0.25 mg at  10/26/20 7619   carvedilol (COREG) tablet 6.25 mg, 6.25 mg, Per Tube, BID WC, Audria Nine, DO   chlorhexidine gluconate (MEDLINE KIT) (PERIDEX) 0.12 % solution 15 mL, 15 mL, Mouth Rinse, BID, Margaretha Seeds, MD, 15 mL at 10/26/20 5093   Chlorhexidine Gluconate Cloth 2 % PADS 6 each, 6 each, Topical, Daily, Freddi Starr, MD, 6 each at 10/24/20 1109   cloNIDine (CATAPRES) tablet 0.1 mg, 0.1 mg, Oral, BID, Audria Nine, DO, 0.1 mg at 10/26/20 1004   docusate (COLACE) 50 MG/5ML liquid 100 mg, 100 mg, Per Tube, BID, Isla Pence, MD, 100 mg at 10/26/20 1005   doxepin (SINEQUAN) 10 MG/ML solution 10 mg, 10 mg, Per Tube, QHS, Omar Person, NP, 10 mg at 10/25/20 2233   feeding supplement (GLUCERNA 1.2 CAL) liquid 1,000 mL, 1,000 mL, Per Tube, Continuous, Audria Nine, DO, Last Rate: 45 mL/hr at 10/26/20 1227, Rate Change at 10/26/20 1227   gabapentin (NEURONTIN) 250 MG/5ML solution 200 mg, 200 mg, Per Tube, Q8H, Omar Person, NP, 200 mg at 10/26/20 0528   heparin injection 5,000 Units, 5,000 Units, Subcutaneous, Q8H, Margaretha Seeds, MD, 5,000 Units at 10/26/20 0532   hydrALAZINE (APRESOLINE) injection 10 mg, 10 mg, Intravenous, Q4H PRN, Omar Person, NP, 10 mg at 10/26/20 0735   hydrALAZINE (APRESOLINE) tablet 50 mg, 50 mg, Oral, Q8H, Loanne Drilling,  Darlis Loan, MD, 50 mg at 10/26/20 0528   insulin aspart (novoLOG) injection 0-9 Units, 0-9 Units, Subcutaneous, Q4H, Omar Person, NP, 3 Units at 10/26/20 1229   labetalol (NORMODYNE) injection 10 mg, 10 mg, Intravenous, Q2H PRN, Omar Person, NP, 10 mg at 10/22/20 0850   labetalol (NORMODYNE) injection 10 mg, 10 mg, Intravenous, Once, Omar Person, NP   MEDLINE mouth rinse, 15 mL, Mouth Rinse, 10 times per day, Margaretha Seeds, MD, 15 mL at 10/26/20 1005   multivitamin with minerals tablet 1 tablet, 1 tablet, Per Tube, Daily, Freddi Starr, MD, 1 tablet at 10/26/20 1004   nicardipine  (CARDENE) 66m in 0.86% saline 2048mIV infusion (0.1 mg/ml), 3-15 mg/hr, Intravenous, Continuous, ElMargaretha SeedsMD, Stopped at 10/26/20 1019   nicotine (NICODERM CQ - dosed in mg/24 hours) patch 21 mg, 21 mg, Transdermal, Daily, EuHayden Pedro, NP, 21 mg at 10/26/20 1005   ondansetron (ZOFRAN) injection 4 mg, 4 mg, Intravenous, Q6H PRN, GrMagdalen SpatzNP   oxyCODONE (Oxy IR/ROXICODONE) immediate release tablet 5 mg, 5 mg, Per Tube, Q6H, EuOmar PersonNP, 5 mg at 10/26/20 1004   pantoprazole (PROTONIX) injection 40 mg, 40 mg, Intravenous, Daily, PaMick SellPA-C, 40 mg at 10/26/20 1005   polyethylene glycol (MIRALAX / GLYCOLAX) packet 17 g, 17 g, Per Tube, Daily, HaIsla PenceMD, 17 g at 10/26/20 1005   sertraline (ZOLOFT) tablet 50 mg, 50 mg, Per Tube, Daily, EuOmar PersonNP, 50 mg at 10/26/20 1004   simvastatin (ZOCOR) tablet 20 mg, 20 mg, Per Tube, q1800, EuOmar PersonNP, 20 mg at 10/25/20 1713   sodium zirconium cyclosilicate (LOKELMA) packet 10 g, 10 g, Per Tube, BID, MaAudria NineDO, 10 g at 10/26/20 1005   thiamine 50074mn normal saline (74m69mVPB, 500 mg, Intravenous, Q8H, Stopped at 10/26/20 0557 **FOLLOWED BY** [START ON 10/27/2020] thiamine (B-1) injection 100 mg, 100 mg, Intravenous, Daily, Bhagat, Srishti L, MD   valproic acid (DEPAKENE) 250 MG/5ML solution 250 mg, 250 mg, Per Tube, Daily, ElliMargaretha Seeds, 250 mg at 10/26/20 1005  Pertinent Labs MMA 351. Vitamin B1 252.8. Vitamin B12 1372.   EEG 10/24/20.  This study showed generalized periodic discharges with triphasic morphology at 1.5-2 Hz which is on the ictal-interictal continuum with low potential for seizures. This eeg pattern can also been seen due toxic-metabolic causes. Additionally there is moderate to severe diffuse encephalopathy, nonspecific etiology. No seizures were seen throughout the recording.  Assessment: 64 y63male with chronic illnesses who presented one week  ago with c/o unresponsiveness at SNF and required endotracheal intubation. Neurology was called to consult for encephalopathy and this has improved. His Depakote was changed from IV to per tube yesterday after Cortrak placed. He has had no active seizure activity and EEG was slow but without seizure activity but consistent with diffuse encephalopathy. He is awake and plans for extubation soon.   Recommendations/Plan:  -Continue high dose Thiamine then 100mg17mqd.  -Neurology will be available prn for questions.   Pt seen by KarenClance Boll, APN-BC/Nurse Practitioner/Neuro and MD. Note and plan to be edited as needed by MD.  Pager: [phone]

## 2020-10-26 NOTE — Progress Notes (Signed)
NAME:  [name], MRN:  [id number], DOB:  1955/08/10, LOS: 7 ADMISSION DATE:  11/03/2020, CONSULTATION DATE:  10/4 REFERRING MD:  Gilford Raid, CHIEF COMPLAINT:  Hypercarbic Respiratory Failure   History of Present Illness:  All information from medical records as patient is intubated and sedated.   65 year old male current every day smoker with history of asthma, severe COPD with chronic hypoxemia on 3.5 L during the day, 3 L at night, type 2 diabetes, CKD stage IIIb, PVD and hypertension presents to the ED 10/4  with AMS.  Pt was admitted 1 week prior to this admission week (9/26-29) for COPD exacerbation and sent to [facility] for rehab on 10/14/2020 with prednisone taper and 3 day course of augementin. He was apparently normal at 1000 on the day of admission, but was noticed to have increased WOB at 1100.  Pt was given 1x  nebulizer treatment by the facility and EMS was called.  When EMS arrived, he was found to be unresponsive and required BVM with oral airway.  After he was bagged for several minutes, he started to wake up some and he was put on a 15 L NRB. Pt was given 20 mg albuterol, 125 mg solumedrol, and 2 g magnesium en route. In the ED he was given a BiPAP trial, which he failed, ( ABG after BiPAP was 7.23, 78.7/74/ bicarb 33.7/ sat of 91%) and patient was intubated in the ED. After intubation, patient became hypotensive and bradycardic. He received atropine 1mg  x 1 and 2L of LR. PCCM have been called to admit to ICU and manage care. On the unit patient remained hypotensive but responded appropriately to additional fluid bolus.   Of note, patient does have a BiPAP machine that he does not wear per his wife.  It was not with him at rehab.   Pt. can not be discharged to rehab without BiPAP in the future.   Pertinent  Medical History    Past Medical History:  Diagnosis Date   Anxiety    Arthritis    Chronic narcotic use    Complication of anesthesia    h/o aspiration during back surgery    Depression    Diabetic peripheral neuropathy (HCC)    Hearing loss    wears bilateral hearing aids, bilat moderate SNHL   Hyperlipidemia    Hypertension    Insomnia    Tobacco abuse    Type 1 diabetes mellitus (HCC)    Ulcer of left ankle (HCC)    Vision abnormalities    mild DM retinopathy     Significant Hospital Events: Including procedures, antibiotic start and stop dates in addition to other pertinent events   Recent Hospitalization  (9/26-29) for COPD exacerbation , discharged to [facility] without BiPAP 10/25/2020 Admission to [place] with AMS, found to be hypercarbic failed BiPAP trial intubated, Ceftriaxone 10/4, azithromycin 10/4  10/20/2020 transitioned to cefepime for HCAP coverage. Failed SBT due to hypoxic, tachypnea, agitation  Extubated 10/6, to 6 L , BiPAP overnight  10/8 - Intubated 10/9 - New diffuse weakness. Neurology consulted. MRI brain and spine - neg. However limited exam due to cochlear implant  Interim History / Subjective:  10/11: no acute events overnight. Awaiting wife to discuss plan should pt fail extubation.  10/10: remains intubated. Alert following commands. Eeg yesterday with [employer]'s  Objective   Blood pressure (!) 146/64, pulse 86, temperature 98.6 F (37 C), temperature source Oral, resp. rate 19, height 5\' 9"  (1.753 m),  weight 65.9 kg, SpO2 94 %.    Vent Mode: PSV;CPAP FiO2 (%):  [40 %] 40 % Set Rate:  [18 bmp] 18 bmp Vt Set:  [520 mL] 520 mL PEEP:  [5 cmH20] 5 cmH20 Pressure Support:  [4 cmH20-8 cmH20] 4 cmH20 Plateau Pressure:  [14 cmH20-15 cmH20] 14 cmH20   Intake/Output Summary (Last 24 hours) at 10/26/2020 1231 Last data filed at 10/26/2020 1200 Gross per 24 hour  Intake 1134.99 ml  Output 1250 ml  Net -115.01 ml   Filed Weights   10/23/20 0500 10/25/20 0600 10/26/20 0500  Weight: 61.2 kg 62.1 kg 65.9 kg   Physical Exam: General: Critically and chronically  ill-appearing, no acute distress,  HENT: [place], AT, ETT in  place Eyes: EOMI, no scleral icterus Respiratory: Diminished breath sounds bilaterally.  No crackles, wheezing or rales Cardiovascular: RRR, -M/R/G, no JVD GI: BS+, soft, nontender Extremities:-Edema,-tenderness Neuro: Eyes open, intermittently follows commands including sticking tongue out. No movement seen in upper or lower extremities  Trach aspirate 10/7 -few candida Bcx 10/4 - negative 10/10: Cr 1.9   Resolved Hospital Problem list   Peri-intubation shock, lactic acidosis  Assessment & Plan:   Acute metabolic encephalopathy exacerbated by hypertension ICU delirium - hearing aid and glasses in place CT head 10/6 neg. MRI non-diagnostic d/t cochlear implant. MRI C-and T-spine neg. Cefepime Dc'd 10/7  Neurology following. Appreciate input. Frequent re-orientation  Acute on Chronic Hypercarbic Respiratory Failure in setting of AECOPD and RLL PNA  -on 3.5L day, 3L HS COPD and asthma Current every day smoker Extubated 10/6, reintubated 10/8 Plan -Known OSA and has been non adherent with his BIPAP which could also be contributing.  -Will need BiPAP ( that he has at home ) at rehab facility once discharged -Has refused BiPAP post-extubation 10/7 worsening respiratory distress. Had emesis on BiPAP overnight and was re-intubated -Completed 5 d steroids and azithro Plan: Full vent support. Wean FIO2/PEEP for goal SpO2 88-95% Extubation precluded by weakness Scheduled Pulmicort and DuoNebs, PRN albuterol -awaiting wife to participate in discussion should pt fail extubation.   Bradycardia secondary to sedation PAD protocol with Fentanyl for RASS goal 0  Hypertension Wean cardene for SBP goal <180 -increase carvedilol -cont norvasc, hydral for now -off cardene today  T2DM Wears Freestyle Libre at home  Received 18u of ssi ON, on levemir 20u BID Plan: CBG Q4  Continue levemir + SSI  CKD stage IIIb, slightly worsening Unclear BL sCr though 1.5-1.7 seems to be where he  is typically.  Good UOP Plan: Monitor I&O Trend BMET Replete electrolytes as needed  Avoid nephrotoxic medications , maintain renal perfusion  Depression  Plan Depakote 250 daily  Restart home Zoloft and Sinequan   Chronic Pain  Plan Restart home Neurontin to assist with sedation weaning  On home MS Contin 15 mg BID. Will add scheduled oxycodone to help with weaning of sedation.   Nutrition  Plan TF per dietitian Cor Trac placement 10/7   Abnormal Low Dose CT 05/2020> 4A > suspected infectious/ inflammatory Due for Follow up 08/2020 , not yet done Every day smoker 1- 1.5 per day prior to hospitalization 10/11/2020 40 + pack year smoking history Plan Needs smoking cessation counseling  Best Practice (right click and "Reselect all SmartList Selections" daily)   Diet/type: tubefeeds DVT prophylaxis: prophylactic heparin  GI prophylaxis: PPI Lines: N/A Foley:  Yes, and it is still needed Code Status:  full code Last date of multidisciplinary goals of care discussion : Wife  updated 10/8, with plan of care.  She is caring for a toddler, and she is only able to come to visit MWF 9 am - 1 pm, while the child is in daycare.  [place] , [phone] ( Wife) Daughter Maple Mirza [phone]  Palliative consulted  Labs     CBC: Recent Labs  Lab 10/26/2020 1236 11/07/2020 1321 10/20/20 0441 10/21/20 0930 10/21/20 1000 10/22/20 0249 10/22/20 1424 10/22/20 2125 10/23/20 0115 10/24/20 0200  WBC 26.1*   < > 25.4*  --  20.5* 9.4  --  18.7* 14.8* 14.9*  NEUTROABS 22.8*  --  22.4*  --   --   --   --   --  13.1*  --   HGB 14.4   < > 11.7*   < > 11.9* 10.3* 9.9* 11.9* 11.1* 11.2*  HCT 44.5   < > 36.0*   < > 37.2* 32.3* 29.0* 35.6* 34.8* 34.9*  MCV 89.2   < > 88.7  --  89.2 89.0  --  88.1 88.8 89.9  PLT 375   < > 204  --  193 176  --  292 275 299   < > = values in this interval not displayed.    Basic Metabolic Panel: Recent Labs  Lab 10/21/20 1627 10/22/20 0249  10/22/20 1424 10/22/20 1652 10/22/20 2021 10/22/20 2125 10/23/20 0115 10/24/20 0200 10/25/20 0118 10/25/20 1635 10/26/20 0610  NA  --  138   < >  --  139 138 140 141 145  --  147*  K  --  4.8   < >  --  4.5 4.5 4.4 4.7 4.5  --  5.3*  CL  --  102  --   --  103 101 102 106 109  --  112*  CO2  --  24  --   --  25 23 26 23 25   --  24  GLUCOSE  --  235*  --   --  203* 196* 207* 209* 162*  --  191*  BUN  --  53*  --   --  52* 59* 60* 66* 67*  --  67*  CREATININE  --  1.69*  --   --  1.69* 1.69* 1.71* 1.84* 1.92*  --  1.90*  CALCIUM  --  8.2*  --   --  8.3* 8.7* 8.6* 8.5* 8.4*  --  8.8*  MG 2.3 2.1  --   --  2.1  --  2.0  --   --  2.2 2.4  PHOS 4.6 4.8*  --  4.4  --   --   --   --   --  4.3 4.2   < > = values in this interval not displayed.   GFR: Estimated Creatinine Clearance: 35.6 mL/min (A) (by C-G formula based on SCr of 1.9 mg/dL (H)). Recent Labs  Lab 10/21/2020 1509 11/15/2020 1651 10/20/20 1211 10/20/20 1336 10/20/20 1955 10/21/20 1000 10/22/20 0249 10/22/20 2125 10/23/20 0115 10/24/20 0200  PROCALCITON 0.58  --  3.30  --   --   --   --   --   --   --   WBC 14.0*   < >  --   --   --  20.5* 9.4 18.7* 14.8* 14.9*  LATICACIDVEN  --    < >  --  3.4* 3.0* 2.3*  --   --  1.1  --    < > = values in this interval not displayed.  Liver Function Tests: Recent Labs  Lab 10/20/20 0441 10/23/20 0115  AST 15 19  ALT 19 19  ALKPHOS 54 57  BILITOT 0.3 0.9  PROT 5.0* 5.3*  ALBUMIN 2.4* 2.3*   CBG: Recent Labs  Lab 10/25/20 2226 10/26/20 0050 10/26/20 0325 10/26/20 0833 10/26/20 1149  GLUCAP 191* 191* 208* 171* 206*    Critical care time: The patient is critically ill with multiple organ systems failure and requires high complexity decision making for assessment and support, frequent evaluation and titration of therapies, application of advanced monitoring technologies and extensive interpretation of multiple databases.  Critical care time 32 mins. This represents my  time independent of the NPs time taking care of the pt. This is excluding procedures.    Audria Nine DO [facility] 10/26/2020, 12:31 PM See Amion for pager If no response to pager, please call [phone] until 1900 After 1900 please call [facility] [phone]

## 2020-10-27 ENCOUNTER — Ambulatory Visit: Payer: Medicare Other | Admitting: Pulmonary Disease

## 2020-10-27 DIAGNOSIS — J9602 Acute respiratory failure with hypercapnia: Secondary | ICD-10-CM | POA: Diagnosis not present

## 2020-10-27 LAB — GLUCOSE, CAPILLARY
Glucose-Capillary: 197 mg/dL — ABNORMAL HIGH (ref 70–99)
Glucose-Capillary: 211 mg/dL — ABNORMAL HIGH (ref 70–99)
Glucose-Capillary: 227 mg/dL — ABNORMAL HIGH (ref 70–99)
Glucose-Capillary: 234 mg/dL — ABNORMAL HIGH (ref 70–99)
Glucose-Capillary: 234 mg/dL — ABNORMAL HIGH (ref 70–99)
Glucose-Capillary: 277 mg/dL — ABNORMAL HIGH (ref 70–99)

## 2020-10-27 LAB — BASIC METABOLIC PANEL
Anion gap: 8 (ref 5–15)
BUN: 60 mg/dL — ABNORMAL HIGH (ref 8–23)
CO2: 26 mmol/L (ref 22–32)
Calcium: 8.1 mg/dL — ABNORMAL LOW (ref 8.9–10.3)
Chloride: 107 mmol/L (ref 98–111)
Creatinine, Ser: 1.86 mg/dL — ABNORMAL HIGH (ref 0.61–1.24)
GFR, Estimated: 39 mL/min — ABNORMAL LOW (ref >60)
Glucose, Bld: 223 mg/dL — ABNORMAL HIGH (ref 70–99)
Potassium: 4.5 mmol/L (ref 3.5–5.1)
Sodium: 141 mmol/L (ref 135–145)

## 2020-10-27 MED ORDER — DILTIAZEM 12 MG/ML ORAL SUSPENSION
60.0000 mg | Freq: Three times a day (TID) | ORAL | Status: DC
  Administered 2020-10-27 – 2020-10-28 (×3): 60 mg
  Filled 2020-10-27 (×5): qty 6

## 2020-10-27 MED ORDER — HYDRALAZINE HCL 50 MG PO TABS
100.0000 mg | ORAL_TABLET | Freq: Three times a day (TID) | ORAL | Status: DC
  Administered 2020-10-27 – 2020-10-28 (×3): 100 mg
  Filled 2020-10-27 (×3): qty 2

## 2020-10-27 MED ORDER — CLONIDINE HCL 0.2 MG PO TABS
0.1000 mg | ORAL_TABLET | Freq: Every day | ORAL | Status: DC
  Administered 2020-10-28: 0.1 mg
  Filled 2020-10-27: qty 1

## 2020-10-27 MED ORDER — NICOTINE 14 MG/24HR TD PT24
14.0000 mg | MEDICATED_PATCH | Freq: Every day | TRANSDERMAL | Status: DC
  Administered 2020-10-27 – 2020-10-28 (×2): 14 mg via TRANSDERMAL
  Filled 2020-10-27 (×2): qty 1

## 2020-10-27 MED ORDER — CLONIDINE HCL 0.2 MG PO TABS: 0.1000 mg | ORAL_TABLET | Freq: Every day | ORAL | Status: DC

## 2020-10-27 MED ORDER — INSULIN ASPART 100 UNIT/ML IJ SOLN
0.0000 [IU] | INTRAMUSCULAR | Status: DC
Start: 2020-10-27 — End: 2020-10-28
  Administered 2020-10-27: 5 [IU] via SUBCUTANEOUS
  Administered 2020-10-27 – 2020-10-28 (×2): 8 [IU] via SUBCUTANEOUS
  Administered 2020-10-28: 5 [IU] via SUBCUTANEOUS
  Administered 2020-10-28: 3 [IU] via SUBCUTANEOUS

## 2020-10-27 MED ORDER — GLUCERNA 1.2 CAL PO LIQD
1000.0000 mL | ORAL | Status: DC
  Administered 2020-10-27: 1000 mL
  Filled 2020-10-27 (×2): qty 1000

## 2020-10-27 MED ORDER — THIAMINE HCL 100 MG PO TABS: 100.0000 mg | ORAL_TABLET | Freq: Every day | ORAL | Status: DC

## 2020-10-27 NOTE — Progress Notes (Signed)
NAME:  [name], MRN:  [id number], DOB:  1955-01-21, LOS: 69 ADMISSION DATE:  10/27/2020, CONSULTATION DATE:  10/4 REFERRING MD:  Gilford Raid, CHIEF COMPLAINT:  Hypercarbic Respiratory Failure   History of Present Illness:  All information from medical records as patient is intubated and sedated.   65 year old male current every day smoker with history of asthma, severe COPD with chronic hypoxemia on 3.5 L during the day, 3 L at night, type 2 diabetes, CKD stage IIIb, PVD and hypertension presents to the ED 10/4  with AMS.  Pt was admitted 1 week prior to this admission week (9/26-29) for COPD exacerbation and sent to [facility] for rehab on 10/14/2020 with prednisone taper and 3 day course of augementin. He was apparently normal at 1000 on the day of admission, but was noticed to have increased WOB at 1100.  Pt was given 1x  nebulizer treatment by the facility and EMS was called.  When EMS arrived, he was found to be unresponsive and required BVM with oral airway.  After he was bagged for several minutes, he started to wake up some and he was put on a 15 L NRB. Pt was given 20 mg albuterol, 125 mg solumedrol, and 2 g magnesium en route. In the ED he was given a BiPAP trial, which he failed, ( ABG after BiPAP was 7.23, 78.7/74/ bicarb 33.7/ sat of 91%) and patient was intubated in the ED. After intubation, patient became hypotensive and bradycardic. He received atropine 1mg  x 1 and 2L of LR. PCCM have been called to admit to ICU and manage care. On the unit patient remained hypotensive but responded appropriately to additional fluid bolus.   Of note, patient does have a BiPAP machine that he does not wear per his wife.  It was not with him at rehab.   Pt. can not be discharged to rehab without BiPAP in the future.   Pertinent  Medical History    Past Medical History:  Diagnosis Date   Anxiety    Arthritis    Chronic narcotic use    Complication of anesthesia    h/o aspiration during back surgery    Depression    Diabetic peripheral neuropathy (HCC)    Hearing loss    wears bilateral hearing aids, bilat moderate SNHL   Hyperlipidemia    Hypertension    Insomnia    Tobacco abuse    Type 1 diabetes mellitus (HCC)    Ulcer of left ankle (HCC)    Vision abnormalities    mild DM retinopathy     Significant Hospital Events: Including procedures, antibiotic start and stop dates in addition to other pertinent events   Recent Hospitalization  (9/26-29) for COPD exacerbation , discharged to [facility] without BiPAP 10/22/2020 Admission to [facility] with AMS, found to be hypercarbic failed BiPAP trial intubated, Ceftriaxone 10/4, azithromycin 10/4  10/20/2020 transitioned to cefepime for HCAP coverage. Failed SBT due to hypoxic, tachypnea, agitation  Extubated 10/6, to 6 L , BiPAP overnight  10/8 - Intubated 10/9 - New diffuse weakness. Neurology consulted. MRI brain and spine - neg. However limited exam due to cochlear implant  Interim History / Subjective:  10/12: tolerated cpap all day yesterday. Some increased secretions today. Awaiting wife today to visit with pt.  10/11: no acute events overnight. Awaiting wife to discuss plan should pt fail extubation.  10/10: remains intubated. Alert following commands. Eeg yesterday with [employer]  Objective   Blood pressure (!) 178/61,  pulse 99, temperature 99.8 F (37.7 C), temperature source Axillary, resp. rate (!) 26, height 5\' 9"  (1.753 m), weight 63.1 kg, SpO2 94 %.    Vent Mode: CPAP;PSV FiO2 (%):  [40 %] 40 % Set Rate:  [18 bmp] 18 bmp Vt Set:  [520 mL] 520 mL PEEP:  [5 cmH20] 5 cmH20 Pressure Support:  [4 cmH20-5 cmH20] 5 cmH20 Plateau Pressure:  [13 cmH20-15 cmH20] 13 cmH20   Intake/Output Summary (Last 24 hours) at 10/27/2020 0849 Last data filed at 10/27/2020 0600 Gross per 24 hour  Intake 1389.76 ml  Output 1600 ml  Net -210.24 ml   Filed Weights   10/25/20 0600 10/26/20 0500 10/27/20 0443  Weight: 62.1 kg 65.9 kg 63.1 kg    Physical Exam: General: Critically and chronically  ill-appearing, no acute distress,  HENT: NCAT, ETT in place Eyes: EOMI, no scleral icterus Respiratory: Diminished breath sounds bilaterally.  No crackles, wheezing or rales Cardiovascular: RRR, -M/R/G, no JVD GI: BS+, soft, nontender Extremities:-Edema,-tenderness Neuro: Eyes open, intermittently follows commands including sticking tongue out. 2+ to 3/5 strength in [facility] Problem list   Peri-intubation shock, lactic acidosis  Assessment & Plan:   Acute metabolic encephalopathy exacerbated by hypertension ICU delirium - hearing aid and glasses in place CT head 10/6 neg. MRI non-diagnostic d/t cochlear implant. MRI C-and T-spine neg. Cefepime Dc'd 10/7  Neurology following. Appreciate input. Frequent re-orientation  Acute on Chronic Hypercarbic Respiratory Failure in setting of AECOPD and RLL PNA  -on 3.5L day, 3L HS COPD and asthma Current every day smoker Extubated 10/6, reintubated 10/8 Plan -Known OSA and has been non adherent with his BIPAP which could also be contributing.  -Will need BiPAP ( that he has at home ) at rehab facility once discharged -Has refused BiPAP post-extubation, so predicting long term success may be challenging.  10/7 worsening respiratory distress. Had emesis on BiPAP overnight and was re-intubated -Completed 5 d steroids and azithro Plan: Full vent support. Wean FIO2/PEEP for goal SpO2 88-95% Extubation precluded by weakness Scheduled Pulmicort and DuoNebs, PRN albuterol -awaiting wife to participate in discussion should pt fail extubation.   Bradycardia secondary to sedation PAD protocol with Fentanyl for RASS goal 0  Hypertension Wean cardene for SBP goal <180 - carvedilol -stop norvasc, change to dilt and uptitrate as needed -wean clonidine - hydralazine increased to 100mg  tid  T2DM Wears Freestyle Libre at home  Plan: CBG Q4  Continue SSI but increase to  moderate, cont to hold long acting insulin for now.   CKD stage IIIb, slightly worsening Unclear BL sCr though 1.5-1.7 seems to be where he is typically.  Good UOP Plan: Monitor I&O Trend BMET Replete electrolytes as needed  Avoid nephrotoxic medications , maintain renal perfusion -will complete lokelma with this dose  Depression  Plan Depakote 250 daily  cont Zoloft and Sinequan   Chronic Pain  Plan Cont neurontin On home MS Contin 15 mg BID. Will add scheduled oxycodone to help with weaning of sedation.   Nutrition  Plan TF per dietitian Cor Trac placement 10/7   Abnormal Low Dose CT 05/2020> 4A > suspected infectious/ inflammatory Due for Follow up 08/2020 , not yet done Every day smoker 1- 1.5 per day prior to hospitalization 10/11/2020 40 + pack year smoking history Plan Needs smoking cessation counseling  Best Practice (right click and "Reselect all SmartList Selections" daily)   Diet/type: tubefeeds DVT prophylaxis: prophylactic heparin  GI prophylaxis: PPI Lines: N/A  Foley:  Yes, and it is still needed Code Status:  full code Last date of multidisciplinary goals of care discussion : Wife updated 10/8, with plan of care.  She is caring for a toddler, and she is only able to come to visit MWF 9 am - 1 pm, while the child is in daycare.  [place] , [phone] ( Wife) Daughter Maple Mirza [phone]  Palliative consulted  Labs     CBC: Recent Labs  Lab 10/21/20 1000 10/22/20 0249 10/22/20 1424 10/22/20 2125 10/23/20 0115 10/24/20 0200  WBC 20.5* 9.4  --  18.7* 14.8* 14.9*  NEUTROABS  --   --   --   --  13.1*  --   HGB 11.9* 10.3* 9.9* 11.9* 11.1* 11.2*  HCT 37.2* 32.3* 29.0* 35.6* 34.8* 34.9*  MCV 89.2 89.0  --  88.1 88.8 89.9  PLT 193 176  --  292 275 364    Basic Metabolic Panel: Recent Labs  Lab 10/22/20 0249 10/22/20 1424 10/22/20 1652 10/22/20 2021 10/22/20 2125 10/23/20 0115 10/24/20 0200 10/25/20 0118 10/25/20 1635  10/26/20 0610 10/26/20 1646 10/27/20 0135  NA 138   < >  --  139   < > 140 141 145  --  147*  --  141  K 4.8   < >  --  4.5   < > 4.4 4.7 4.5  --  5.3*  --  4.5  CL 102  --   --  103   < > 102 106 109  --  112*  --  107  CO2 24  --   --  25   < > 26 23 25   --  24  --  26  GLUCOSE 235*  --   --  203*   < > 207* 209* 162*  --  191*  --  223*  BUN 53*  --   --  52*   < > 60* 66* 67*  --  67*  --  60*  CREATININE 1.69*  --   --  1.69*   < > 1.71* 1.84* 1.92*  --  1.90*  --  1.86*  CALCIUM 8.2*  --   --  8.3*   < > 8.6* 8.5* 8.4*  --  8.8*  --  8.1*  MG 2.1  --   --  2.1  --  2.0  --   --  2.2 2.4 2.1  --   PHOS 4.8*  --  4.4  --   --   --   --   --  4.3 4.2 4.4  --    < > = values in this interval not displayed.   GFR: Estimated Creatinine Clearance: 34.9 mL/min (A) (by C-G formula based on SCr of 1.86 mg/dL (H)). Recent Labs  Lab 10/20/20 1211 10/20/20 1336 10/20/20 1955 10/21/20 1000 10/21/20 1000 10/22/20 0249 10/22/20 2125 10/23/20 0115 10/24/20 0200  PROCALCITON 3.30  --   --   --   --   --   --   --   --   WBC  --   --   --  20.5*   < > 9.4 18.7* 14.8* 14.9*  LATICACIDVEN  --  3.4* 3.0* 2.3*  --   --   --  1.1  --    < > = values in this interval not displayed.    Liver Function Tests: Recent Labs  Lab 10/23/20 0115  AST 19  ALT 19  ALKPHOS  87  BILITOT 0.9  PROT 5.3*  ALBUMIN 2.3*   CBG: Recent Labs  Lab 10/26/20 1540 10/26/20 1906 10/26/20 2316 10/27/20 0307 10/27/20 0736  GLUCAP 130* 169* 197* 197* 234*    Critical care time: The patient is critically ill with multiple organ systems failure and requires high complexity decision making for assessment and support, frequent evaluation and titration of therapies, application of advanced monitoring technologies and extensive interpretation of multiple databases.  Critical care time 39 mins. This represents my time independent of the NPs time taking care of the pt. This is excluding procedures.    Audria Nine DO [facility] 10/27/2020, 8:49 AM See Amion for pager If no response to pager, please call [phone] until 1900 After 1900 please call [facility] [phone]

## 2020-10-27 NOTE — Progress Notes (Signed)
Pt requesting to come off Bipap.  Pt taken of Bipap and placed on NRB, pt seems comfortably at this time with no WOB. Pt NTS at this time, moderate amount of secretions obtained. Bipap on Standby, will place pt back if WOB increases.

## 2020-10-27 NOTE — Progress Notes (Signed)
Spoke with wife and pt at bedside. They understand how weak the pt is but that he has successfully tolerated multiple days of cpap trials. They would like to proceed with extubation at this time in hopes that he will be successfully liberated. Should he decline after extubation they have opted for comfort care rather than aggressive interventions and subsequent trach.   All questions answered to best of my ability.

## 2020-10-27 NOTE — Progress Notes (Signed)
Attempts made to contact wife to update on pt's status. He has become more lethargic, placed on NIV with improvement in saturations to high 90's but still with poor airway protection, ongoing secretions. While he appears generally comfortable he intermittently will have increased wob.   Plan was to transition to comfort care should pt decompensate. While he is "holding his own" with the assistance of NIV it would be prudent to provide some ease to his wob with morphine however I am unable to reach wife.   Will cont attempts.  She has been updated by the RN in re: his desaturations and need for NIV.    He remains DNR/DNI as previously discussed.

## 2020-10-27 NOTE — Procedures (Signed)
Extubation Procedure Note  Patient Details:   Name: [name] DOB: 16-Apr-1955 MRN: [id number]   Airway Documentation:    Vent end date: 10/27/20 Vent end time: 1145   Evaluation  O2 sats: stable throughout Complications: No apparent complications Patient did tolerate procedure well. Bilateral Breath Sounds: Rhonchi   No, SPO2 97% on 3L [place]  Gonzella Lex 10/27/2020, 11:52 AM

## 2020-10-27 NOTE — Progress Notes (Signed)
Nutrition Follow-up  DOCUMENTATION CODES:   Severe malnutrition in context of chronic illness  INTERVENTION:   Tube feeding via Cortrak tube: Glucerna 1.2 at 70 ml/h (1680 ml per day).  Provides 2016 kcal, 101 gm protein, 1352 ml free water daily.  Continue MVI with minerals 1 tablet daily, crushed and given via tube.  NUTRITION DIAGNOSIS:   Severe Malnutrition related to chronic illness (COPD) as evidenced by severe muscle depletion, severe fat depletion.  Ongoing  GOAL:   Patient will meet greater than or equal to 90% of their needs  Progressing  MONITOR:   Vent status, Labs, TF tolerance, Skin  REASON FOR ASSESSMENT:   Ventilator, Consult Enteral/tube feeding initiation and management  ASSESSMENT:   65 yo male admitted with acute on chronic respiratory failure after being found down at his rehab facility. PMH includes severe COPD, active smoker, chronic respiratory failure, asthma, anxiety, arthritis, HLD, HTN, left ankle ulcer, chronic narcotic use, DM type 1, depression, hearing loss, cochlear implant, DM retinopathy.  Discussed patient in ICU rounds and with RN today. Cortrak in place, tip is gastric.  Currently receiving Glucerna 1.2 at 55 ml/h, tolerating well.  Extubated today. Plans to continue TF via Cortrak tube. Hopeful for diet advancement after SLP evaluation. May be able to transition to nocturnal feedings in the next few days, pending adequacy of oral intake.   Labs reviewed.  CBG: [phone]  Medications reviewed and include Colace, Novolog SSI, MVI with minerals, Protonix, Miralax, thiamine.  Admission weight 57.8 kg (10/4) Current weight 63.1 kg  I/O +6 L since admission UOP 1600+ ml x 24 hours  Diet Order:   Diet Order             Diet NPO time specified  Diet effective now                   EDUCATION NEEDS:   Not appropriate for education at this time  Skin:  Skin Assessment: Skin Integrity Issues: Skin Integrity  Issues:: DTI DTI: sacrum  Last BM:  10/12 type 6  Height:   Ht Readings from Last 1 Encounters:  10/16/2020 5\' 9"  (1.753 m)    Weight:   Wt Readings from Last 1 Encounters:  10/27/20 63.1 kg    BMI:  Body mass index is 20.54 kg/m.  Estimated Nutritional Needs:   Kcal:  1900-2100  Protein:  95-115 gm  Fluid:  >/= 1.85 L    Lucas Mallow, RD, LDN, CNSC Please refer to Amion for contact information.

## 2020-10-28 DIAGNOSIS — J9602 Acute respiratory failure with hypercapnia: Secondary | ICD-10-CM | POA: Diagnosis not present

## 2020-10-28 DIAGNOSIS — K567 Ileus, unspecified: Secondary | ICD-10-CM | POA: Diagnosis not present

## 2020-10-28 DIAGNOSIS — J441 Chronic obstructive pulmonary disease with (acute) exacerbation: Secondary | ICD-10-CM | POA: Diagnosis not present

## 2020-10-28 DIAGNOSIS — R0603 Acute respiratory distress: Secondary | ICD-10-CM | POA: Diagnosis not present

## 2020-10-28 LAB — BASIC METABOLIC PANEL
Anion gap: 9 (ref 5–15)
BUN: 52 mg/dL — ABNORMAL HIGH (ref 8–23)
CO2: 30 mmol/L (ref 22–32)
Calcium: 8.3 mg/dL — ABNORMAL LOW (ref 8.9–10.3)
Chloride: 109 mmol/L (ref 98–111)
Creatinine, Ser: 1.75 mg/dL — ABNORMAL HIGH (ref 0.61–1.24)
GFR, Estimated: 42 mL/min — ABNORMAL LOW (ref >60)
Glucose, Bld: 233 mg/dL — ABNORMAL HIGH (ref 70–99)
Potassium: 4.1 mmol/L (ref 3.5–5.1)
Sodium: 148 mmol/L — ABNORMAL HIGH (ref 135–145)

## 2020-10-28 LAB — GLUCOSE, CAPILLARY
Glucose-Capillary: 198 mg/dL — ABNORMAL HIGH (ref 70–99)
Glucose-Capillary: 283 mg/dL — ABNORMAL HIGH (ref 70–99)

## 2020-10-28 MED ORDER — CHLORHEXIDINE GLUCONATE 0.12 % MT SOLN
OROMUCOSAL | Status: AC
  Filled 2020-10-28: qty 15

## 2020-10-28 MED ORDER — POLYVINYL ALCOHOL 1.4 % OP SOLN: 1.0000 [drp] | Freq: Four times a day (QID) | OPHTHALMIC | Status: DC | PRN

## 2020-10-28 MED ORDER — GLYCOPYRROLATE 1 MG PO TABS: 1.0000 mg | ORAL_TABLET | ORAL | Status: DC | PRN

## 2020-10-28 MED ORDER — LORAZEPAM 2 MG/ML IJ SOLN
2.0000 mg | INTRAMUSCULAR | Status: DC | PRN
  Administered 2020-10-28: 2 mg via INTRAVENOUS
  Filled 2020-10-28: qty 1

## 2020-10-28 MED ORDER — ACETAMINOPHEN 325 MG PO TABS: 650.0000 mg | ORAL_TABLET | Freq: Four times a day (QID) | ORAL | Status: DC | PRN

## 2020-10-28 MED ORDER — GLYCOPYRROLATE 0.2 MG/ML IJ SOLN: 0.2000 mg | INTRAMUSCULAR | Status: DC | PRN

## 2020-10-28 MED ORDER — MORPHINE SULFATE (PF) 2 MG/ML IV SOLN
2.0000 mg | INTRAVENOUS | Status: DC | PRN
  Administered 2020-10-28 (×2): 2 mg via INTRAVENOUS
  Administered 2020-10-28: 4 mg via INTRAVENOUS
  Filled 2020-10-28 (×2): qty 1
  Filled 2020-10-28: qty 2

## 2020-10-28 MED ORDER — DEXTROSE 5 % IV SOLN: INTRAVENOUS | Status: DC

## 2020-10-28 MED ORDER — DIPHENHYDRAMINE HCL 50 MG/ML IJ SOLN: 25.0000 mg | INTRAMUSCULAR | Status: DC | PRN

## 2020-10-28 MED ORDER — ACETAMINOPHEN 650 MG RE SUPP: 650.0000 mg | Freq: Four times a day (QID) | RECTAL | Status: DC | PRN

## 2020-11-16 NOTE — Progress Notes (Addendum)
[facility] Liaison Note  Received request from Transitions of Care Manager(Angela Landry Mellow) for family interest in [facility]. Spoke with wife to confirm interest and explain services. Attempted to speak with patient at bedside, however he is unarousable as he just received ativan and morphine.   [facility] does not support the use of BiPAP. Discussed this with wife and feel it is important for patient to understand BiPAP would not be part of the plan of care for comfort.   Since patient is unable to participate in conversation we will revisit in the morning. Discussed with Alda Lea and Dr. Lake Bells. Updated TOC Angela.   Plan is to have family meeting at 10:30am on Friday 10/29/2020.    Thank you.  [facility] Liaison  [phone]

## 2020-11-16 NOTE — Progress Notes (Signed)
Attending note: I have seen and examined the patient. History, labs and imaging reviewed.  Interval History:  10/13: attestation to [name] note from same day. Pt was extubated yesterday and unfortunately has had issues with his resp status since. On and off NIV. He requested this morning to call his family, remove dht and be provided with comfort.      Assessment/plan: Acute on chronic hypoxic resp failure:  -ultimately pt is too weak to clear secretions  -transitioning to comfort care at this time.  -family en route.    The patient is critically ill with multiple organ systems failure and requires high complexity decision making for assessment and support, frequent evaluation and titration of therapies, application of advanced monitoring technologies and extensive interpretation of multiple databases.  Critical care time 35 mins. This represents my time independent of the NP's/PA's/med student/residents time taking care of the pt. This is excluding procedures   [facility] 2020-10-29, 10:27 AM See Amion for pager If no response to pager, please call [phone] until 1900 After 1900 please call [facility] [phone]

## 2020-11-16 NOTE — Progress Notes (Signed)
Pt placed back on Bipap on documented settings. Pt tolerating well.

## 2020-11-16 NOTE — Progress Notes (Signed)
NAME:  [name], MRN:  [id number], DOB:  1955/11/10, LOS: 9 ADMISSION DATE:  11/02/2020, CONSULTATION DATE:  10/4 REFERRING MD:  Gilford Raid, CHIEF COMPLAINT:  Hypercarbic Respiratory Failure   History of Present Illness:  All information from medical records as patient is intubated and sedated.   65 year old male current every day smoker with history of asthma, severe COPD with chronic hypoxemia on 3.5 L during the day, 3 L at night, type 2 diabetes, CKD stage IIIb, PVD and hypertension presents to the ED 10/4  with AMS.  Pt was admitted 1 week prior to this admission week (9/26-29) for COPD exacerbation and sent to [facility] for rehab on 10/14/2020 with prednisone taper and 3 day course of augementin. He was apparently normal at 1000 on the day of admission, but was noticed to have increased WOB at 1100.  Pt was given 1x  nebulizer treatment by the facility and EMS was called.  When EMS arrived, he was found to be unresponsive and required BVM with oral airway.  After he was bagged for several minutes, he started to wake up some and he was put on a 15 L NRB. Pt was given 20 mg albuterol, 125 mg solumedrol, and 2 g magnesium en route. In the ED he was given a BiPAP trial, which he failed, ( ABG after BiPAP was 7.23, 78.7/74/ bicarb 33.7/ sat of 91%) and patient was intubated in the ED. After intubation, patient became hypotensive and bradycardic. He received atropine 1mg  x 1 and 2L of LR. PCCM have been called to admit to ICU and manage care. On the unit patient remained hypotensive but responded appropriately to additional fluid bolus.   Of note, patient does have a BiPAP machine that he does not wear per his wife.  It was not with him at rehab.   Pt. can not be discharged to rehab without BiPAP in the future.   Pertinent  Medical History    Past Medical History:  Diagnosis Date   Anxiety    Arthritis    Chronic narcotic use    Complication of anesthesia    h/o aspiration during back surgery    Depression    Diabetic peripheral neuropathy (HCC)    Hearing loss    wears bilateral hearing aids, bilat moderate SNHL   Hyperlipidemia    Hypertension    Insomnia    Tobacco abuse    Type 1 diabetes mellitus (HCC)    Ulcer of left ankle (HCC)    Vision abnormalities    mild DM retinopathy     Significant Hospital Events: Including procedures, antibiotic start and stop dates in addition to other pertinent events   Recent Hospitalization  (9/26-29) for COPD exacerbation , discharged to [facility] without BiPAP 11/01/2020 Admission to [facility] with AMS, found to be hypercarbic failed BiPAP trial intubated, Ceftriaxone 10/4, azithromycin 10/4  10/20/2020 transitioned to cefepime for HCAP coverage. Failed SBT due to hypoxic, tachypnea, agitation  Extubated 10/6, to 6 L , BiPAP overnight  10/8 - Intubated 10/9 - New diffuse weakness. Neurology consulted. MRI brain and spine - neg. However limited exam due to cochlear implant 10/10: remains intubated. Alert following commands. Eeg yesterday with [employer]' 10/11: no acute events overnight. Awaiting wife to discuss plan should pt fail extubation.  10/12: tolerated cpap all day yesterday. Some increased secretions today. Awaiting wife today to visit with pt. Extubated. Some increased WOB, on and off BiPAP for this   Interim History /  Subjective:   10/13: NAEO. Patient on/off BiPAP. This morning, in discussion with patient, he expresses desire to shift goals of care toward comfort care today, requests that we call his wife  Asks for cortrak to be removed now  Objective   Blood pressure (!) 156/50, pulse 81, temperature (!) 97.5 F (36.4 C), temperature source Oral, resp. rate (!) 23, height 5\' 9"  (1.753 m), weight 64 kg, SpO2 99 %.    Vent Mode: BIPAP FiO2 (%):  [40 %] 40 % Set Rate:  [8 bmp-23 bmp] 12 bmp PEEP:  [5 cmH20-8 cmH20] 8 cmH20 Pressure Support:  [5 cmH20] 5 cmH20   Intake/Output Summary (Last 24 hours) at 11-21-20 0801 Last  data filed at 11/21/20 0600 Gross per 24 hour  Intake 1295.5 ml  Output 1475 ml  Net -179.5 ml   Filed Weights   10/26/20 0500 10/27/20 0443 Nov 21, 2020 0500  Weight: 65.9 kg 63.1 kg 64 kg   Physical Exam: General: Acutely and chronically ill appearing adult M, fatigued appearing reclined in bed NAD  HEENT: NCAT. Cortrak secure. Anicteric sclera Respiratory: Rhonchi in upper lobes. Symmetrical chest expansion. No accessory use  Cardiovascular:RRR s1s2 no rgm no JVD GI: soft ndnt  Extremities:-Edema,-tenderness. No acute joint deformity  Neuro: Awake alert following commands. LUE weaker than RUE.   Resolved Hospital Problem list   Peri-intubation shock lactic acidosis Bradycardia related to sedation  Assessment & Plan:   Acute metabolic encephalopathy, improving ICU delirium, improving  -imaging and EEG without neurologic etiology.  P -glasses, cochlear implant  -anticipate likely transitioning to comfort care 10/13 (waiting to speak with wife) -- pain/anxiety meds will be liberalized in that setting  Acute on chronic hypercarbic  respiratory failure, requiring reintubation (Extubated 10/6, reintubated 10/8, extubated 10/12) AECOPD RLL PNA Tobacco use disorder  OSA P -anticipate transitioning to comfort 10/13   Hypertension T2DM CKD stage IIIb, slightly worsening Depression  Chronic Pain  Abnormal Low Dose CT 05/2020> 4A > suspected infectious/ inflammatory P -dc labs, medications not aimed at comfort   Best Practice (right click and "Reselect all SmartList Selections" daily)   Diet/type: tubefeeds DVT prophylaxis: not indicated GI prophylaxis: PPI Lines: N/A Foley:  Yes, and it is still needed Code Status:  full code Last date of multidisciplinary goals of care discussion : Wife updated 10/13  She is caring for a toddler, and she is only able to come to visit MWF 9 am - 1 pm, while the child is in daycare.  [place] , [phone] ( Wife) Daughter  Maple Mirza [phone]  Palliative consulted  Labs     CBC: Recent Labs  Lab 10/21/20 1000 10/22/20 0249 10/22/20 1424 10/22/20 2125 10/23/20 0115 10/24/20 0200  WBC 20.5* 9.4  --  18.7* 14.8* 14.9*  NEUTROABS  --   --   --   --  13.1*  --   HGB 11.9* 10.3* 9.9* 11.9* 11.1* 11.2*  HCT 37.2* 32.3* 29.0* 35.6* 34.8* 34.9*  MCV 89.2 89.0  --  88.1 88.8 89.9  PLT 193 176  --  292 275 950    Basic Metabolic Panel: Recent Labs  Lab 10/22/20 0249 10/22/20 1424 10/22/20 1652 10/22/20 2021 10/22/20 2125 10/23/20 0115 10/24/20 0200 10/25/20 0118 10/25/20 1635 10/26/20 0610 10/26/20 1646 10/27/20 0135 2020-11-21 0203  NA 138   < >  --  139   < > 140 141 145  --  147*  --  141 148*  K 4.8   < >  --  4.5   < > 4.4 4.7 4.5  --  5.3*  --  4.5 4.1  CL 102  --   --  103   < > 102 106 109  --  112*  --  107 109  CO2 24  --   --  25   < > 26 23 25   --  24  --  26 30  GLUCOSE 235*  --   --  203*   < > 207* 209* 162*  --  191*  --  223* 233*  BUN 53*  --   --  52*   < > 60* 66* 67*  --  67*  --  60* 52*  CREATININE 1.69*  --   --  1.69*   < > 1.71* 1.84* 1.92*  --  1.90*  --  1.86* 1.75*  CALCIUM 8.2*  --   --  8.3*   < > 8.6* 8.5* 8.4*  --  8.8*  --  8.1* 8.3*  MG 2.1  --   --  2.1  --  2.0  --   --  2.2 2.4 2.1  --   --   PHOS 4.8*  --  4.4  --   --   --   --   --  4.3 4.2 4.4  --   --    < > = values in this interval not displayed.   GFR: Estimated Creatinine Clearance: 37.6 mL/min (A) (by C-G formula based on SCr of 1.75 mg/dL (H)). Recent Labs  Lab 10/21/20 1000 10/22/20 0249 10/22/20 2125 10/23/20 0115 10/24/20 0200  WBC 20.5* 9.4 18.7* 14.8* 14.9*  LATICACIDVEN 2.3*  --   --  1.1  --     Liver Function Tests: Recent Labs  Lab 10/23/20 0115  AST 19  ALT 19  ALKPHOS 57  BILITOT 0.9  PROT 5.3*  ALBUMIN 2.3*   CBG: Recent Labs  Lab 10/27/20 1512 10/27/20 1914 10/27/20 2348 November 22, 2020 0313 Nov 22, 2020 0728  GLUCAP 211* 277* 227* 198* 283*    CCT:  n/a  Eliseo Gum MSN, AGACNP-BC [place] for pager  11/22/2020, 8:01 AM

## 2020-11-16 NOTE — Progress Notes (Signed)
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  On my exam does not wake to voice or light touch, his work of breathing is up and he is using accessory muscles.  He has a death rattle.  Will cancel plans for discharge at this time as he is actively dying. Give morphine for comfort No more NIMV Updated wife, advised she should come in. If work of breathing increases, will use morphine infusion rather than injections.  Roselie Awkward, MD [place] PCCM Pager: [phone] Cell: [phone] After 7:00 pm call Elink  [phone]

## 2020-11-16 NOTE — Progress Notes (Signed)
Able to reach wife via phone. Pt desires comfort at this time and removal of cortrak.   She agrees and wants to follow his wishes. She will attempt to come as soon as possible but needs to find care for her 41yr child she is primary caregiver of. She also has expressed attempting to reach pt's daughter who lives in [place]. I did explain to her that we needed to ensure he doesn't suffer with air hunger, she agrees and wants him comfortable. I explained that he may not survive for her or his daughter to arrive. She expressed understanding stating that "things can happen fast".   At this time we will provide some relief for pt to ease wob, as he is tolerating NIV we will cont with this. [place] family will arrive in time.   RN updated.

## 2020-11-16 NOTE — Progress Notes (Signed)
Spoke with palliative care this am they will come by and reassess and possibly offer hospice whether inpt or home.

## 2020-11-16 NOTE — TOC Progression Note (Addendum)
Transition of Care [facility]) - Progression Note    Patient Details  Name: [name] MRN: [id number] Date of Birth: 16-Nov-1955  [facility] CM/SW Contact  Sharin Mons, RN Phone Number: 10/30/2020, 12:29 PM  Clinical Narrative:    Consult received for Residential hospice placement, preference [facility]. NCM spoke pt 's wife ( pt sedated/ bipap) and wife confirmed she would like for husband to transition to [employer] [facility]. Referral made with [facility] . TOC team will continue to monitor and assist with needs...   Expected Discharge Plan: [place] Barriers to Discharge: Continued Medical Work up  Expected Discharge Plan and Services Expected Discharge Plan: [place]                                               Social Determinants of Health (SDOH) Interventions    Readmission Risk Interventions No flowsheet data found.

## 2020-11-16 NOTE — Death Summary Note (Signed)
DEATH SUMMARY   Patient Details  Name: [name] MRN: [id number] DOB: 1956/01/10  Admission/Discharge Information   Admit Date:  26-Oct-2020  Date of Death: Date of Death: 11-04-2020  Time of Death: Time of Death: 1654/04/09  Length of Stay: 04/01/2022  Referring Physician: Leanna Battles, MD   Reason(s) for Hospitalization  Acute resp failure, sob  Diagnoses  Preliminary cause of death: Acute hypoxemic respiratory failure ([place]) Secondary Diagnoses (including complications and co-morbidities):  Active Problems:   Acute respiratory failure with hypercapnia (HCC)   COPD exacerbation (HCC)   Acute on chronic respiratory failure with hypercapnia (HCC)   Protein-calorie malnutrition, severe   Endotracheally intubated   Brief Hospital Course (including significant findings, care, treatment, and services provided and events leading to death)  [name] is a 65 y.o. year old male who per H&P from Eric Form NP on October 27, 2022: is a current every day smoker with history of asthma, severe COPD with chronic hypoxemia on 3.5 L during the day, 3 L at night, type 2 diabetes, CKD stage IIIb, PVD and hypertension presents to the ED 2022-10-27  with AMS.  Pt was admitted 1 week prior to this admission week (9/26-29) for COPD and sent to [facility] for rehab on 10/14/2020. He was apparently normal at 1000 today, and was struggling to breathe at 1100.  Pt was given 1 neb by the facility and EMS was called.  EMS found pt to be unresponsive and required some BVM.  He tolerated an oral airway initially.  After he was bagged for several minutes, he started to wake up some and he was put on a 15 L NRB. Pt was given 20 mg albuterol, 125 mg solumedrol, and 2 g magnesium en route. In the ED he was given a BiPAP trial, which failed, ( ABG after BiPAP was 7.23, 78.7/74/ bicarb 33.7/ sat of 91%) and patient was intubated in the ED. PCCM have been called to admit to ICU and manage care.    Of note, patient does have a BiPAP machine that he  does not wear per his wife.  It was not with him at rehab.    Pt. can not be discharged to rehab without BiPAP in the future.   Pt was admitted to icu and attempts were made at liberation from ventilator. However, his sepsis and pneumonia along with prior to admission debilitation proved to have resulted in profound weakness. He was extubated on 10/6 but req bipap quickly after and also sustained stroke like symptoms for which neurology consultation occurred and it was 2/2 medication use (cefepime) and less likely sz.  He remained weak but was tolerating sbt and requested extubation. Lengthy meetings with pt and his wife as well as palliative care occurred and pt was optimized and extubated. Unfortuantely his secretions were unable to be cleared and he ultimately requested to be transitioned to comfort care. Pt was slated to go to hospice house but unfortunately expired prior to ability to be safely transferred.        Pertinent Labs and Studies  Significant Diagnostic Studies DG Forearm Left  Result Date: 10/21/2020 CLINICAL DATA:  [place] at facility EXAM: LEFT FOREARM - 2 VIEW COMPARISON:  None. FINDINGS: There is no evidence of fracture or other focal bone lesions. Soft tissues are unremarkable. IMPRESSION: Negative. Electronically Signed   By: Donavan Foil M.D.   On: 10/21/2020 19:41   DG Abd 1 View  Result Date: 10/24/2020 CLINICAL DATA:  Abdominal distension, respirator dependent,  ileus EXAM: ABDOMEN - 1 VIEW COMPARISON:  Portable exam 0835 hours compared to 10/22/2020 FINDINGS: Tip of nasogastric tube projects over gastric antrum. Nonobstructive bowel gas pattern. Scattered stool throughout colon to rectum. No bowel dilatation or bowel wall thickening. IMPRESSION: Normal bowel gas pattern. Electronically Signed   By: Lavonia Dana M.D.   On: 10/24/2020 10:32   CT HEAD WO CONTRAST (5MM)  Result Date: 10/21/2020 CLINICAL DATA:  [place] EXAM: CT HEAD WITHOUT CONTRAST TECHNIQUE: Contiguous  axial images were obtained from the base of the skull through the vertex without intravenous contrast. COMPARISON:  10/12/2018 FINDINGS: Brain: Evaluation is somewhat limited by beam hardening artifact from left cochlear implant device peer within this limitation, no evidence of acute infarction, hemorrhage, hydrocephalus, extra-axial collection, mass, mass effect, or midline shift. Vascular: No hyperdense vessel.  No unexpected calcification. Skull: No acute osseous abnormality. Sinuses/Orbits: The sinuses are clear.  The orbits are unremarkable. Other: Fluid is again noted in the left mastoid air cells. The right mastoid air cells are clear. Postsurgical changes in the left mastoid, related to cochlear implant. IMPRESSION: Limited evaluation due to beam hardening artifact from left cochlear implant. Within this limitation, no acute intracranial process. Electronically Signed   By: Merilyn Baba M.D.   On: 10/21/2020 14:10   CT HEAD WO CONTRAST (5MM)  Result Date: 10/11/2020 CLINICAL DATA:  Weakness and confusion EXAM: CT HEAD WITHOUT CONTRAST TECHNIQUE: Contiguous axial images were obtained from the base of the skull through the vertex without intravenous contrast. COMPARISON:  03/10/2020 FINDINGS: Streak artifact from cochlear implant device on the LEFT. Brain: No acute intracranial hemorrhage. No focal mass lesion. No CT evidence of acute infarction. No midline shift or mass effect. No hydrocephalus. Basilar cisterns are patent. Vascular: No hyperdense vessel or unexpected calcification. Skull: Normal. Negative for fracture or focal lesion. Sinuses/Orbits: Paranasal sinusesare clear. Orbits are clear. Small fluid in the inferior LEFT mastoid air cells unchanged from prior. Postsurgical change in the mastoid related to cochlear implant Other: None. IMPRESSION: 1. No acute intracranial findings. Electronically Signed   By: Suzy Bouchard M.D.   On: 10/11/2020 19:05   MR BRAIN WO CONTRAST  Result Date:  10/23/2020 CLINICAL DATA:  Mental status change, persistent or worsening. EXAM: MRI HEAD WITHOUT CONTRAST TECHNIQUE: Multiplanar, multiecho pulse sequences of the brain and surrounding structures were obtained without intravenous contrast. COMPARISON:  Prior head CT examinations 10/21/2020 and earlier. FINDINGS: Brain: Prominent susceptibility artifact arising from a left-sided cochlear implant obscures significant portions of the intracranial contents on multiple sequences, limiting evaluation. Most notably, there is extensive obscuration of the intracranial contents on the diffusion-weighted, SWI and axial T2 FLAIR imaging. Mild to moderate generalized cerebral atrophy. Comparatively mild cerebellar atrophy. The majority of the intracranial contents are obscured on the diffusion-weighted sequences. Within this limitation, no acute infarct is identified. The majority of the intracranial contents are obscured on the axial SWI sequence, precluding adequate evaluation for chronic intracranial blood products. Within described limitations, no intracranial mass or extra-axial fluid collection is identified. No midline shift. Vascular: Flow voids preserved within the proximal large arterial vessels. Skull and upper cervical spine: No appreciable focal suspicious marrow lesion. Sinuses/Orbits: Visualized orbits show no acute finding. No significant paranasal sinus disease. Other: Susceptibility artifact obscures the left mastoid air cells. IMPRESSION: Prominent susceptibility artifact arising from a left-sided cochlear implant, significantly limiting evaluation, as detailed. Within described limitations, there is no appreciable acute intracranial abnormality. Mild-to-moderate generalized cerebral atrophy. Comparatively mild cerebellar atrophy. Electronically Signed  By: Kellie Simmering D.O.   On: 10/23/2020 14:30   MR CERVICAL SPINE WO CONTRAST  Result Date: 10/23/2020 CLINICAL DATA:  New weakness. Additional history  provided: Lower extremity weakness, absence of reflexes. EXAM: MRI CERVICAL SPINE WITHOUT CONTRAST TECHNIQUE: Multiplanar, multisequence MR imaging of the cervical spine was performed. No intravenous contrast was administered. COMPARISON:  CT of the cervical spine 09/20/2018. FINDINGS: There is up to moderate intermittent motion degradation, limiting evaluation. Alignment: Straightening of the expected cervical lordosis. No significant spondylolisthesis. Vertebrae: Vertebral body height is maintained. Mild degenerative endplate edema at L3-Y1. Elsewhere, there is no significant marrow edema or focal suspicious osseous lesion. Cord: Within limitations of motion degradation, no signal abnormality is identified within the cervical spinal cord. Posterior Fossa, vertebral arteries, paraspinal tissues: Cerebellar atrophy. Flow voids preserved within the imaged cervical vertebral arteries. Paraspinal soft tissues unremarkable. Partially imaged enteric tube. Disc levels: Mild-to-moderate disc degeneration throughout the cervical spine. C2-C3: Shallow disc bulge with mild uncovertebral hypertrophy. Facet arthrosis and ligamentum flavum hypertrophy. No significant spinal canal stenosis or foraminal narrowing. C3-C4: Disc bulge with right greater than left disc osteophyte ridge/uncinate hypertrophy. Facet arthrosis. Mild spinal canal narrowing (without spinal cord mass effect). Bilateral neural foraminal narrowing (severe right, mild/moderate left). C4-C5: Disc bulge with bilateral disc osteophyte ridge/uncinate hypertrophy. Facet arthrosis. Mild spinal canal narrowing (without spinal cord mass effect). Bilateral neural foraminal narrowing (moderate right, moderate/severe left). C5-C6: Disc bulge with bilateral uncovertebral hypertrophy. Facet arthrosis. No significant spinal canal stenosis. Moderate left neural foraminal narrowing. C6-C7: Posterior disc osteophyte complex with left left-sided disc osteophyte ridge/uncinate  hypertrophy. Minimal uncovertebral hypertrophy also present on the right. Facet arthrosis. Mild spinal canal narrowing (without spinal cord mass effect). Mild bilateral neural foraminal narrowing (greater on the left). C7-T1: Small disc bulge with minimal endplate spurring. Minimal facet arthrosis. No significant spinal canal or foraminal stenosis. IMPRESSION: Motion degraded examination, limiting evaluation. Within the limitations of motion degradation, no spinal cord signal abnormality is identified. Cervical spondylosis, as outlined. No more than mild spinal canal narrowing (without spinal cord mass effect). Multilevel foraminal stenosis, as detailed and greatest on the right at C3-C4 (severe), bilaterally at C4-C5 (moderate right, moderate/severe left) and on the left at C5-C6 (moderate). Straightening of the expected cervical lordosis. Minimal degenerative endplate edema at O1-B5. Electronically Signed   By: Kellie Simmering D.O.   On: 10/23/2020 13:56   MR THORACIC SPINE WO CONTRAST  Result Date: 10/23/2020 CLINICAL DATA:  Provided history: New weakness. Lower extremity weakness. Absent reflexes. EXAM: MRI THORACIC SPINE WITHOUT CONTRAST TECHNIQUE: Multiplanar, multisequence MR imaging of the thoracic spine was performed. No intravenous contrast was administered. COMPARISON:  Concurrently performed MRI of the cervical spine. Radiographs of the thoracic spine 08/23/2018. FINDINGS: Mild-to-moderate intermittent motion degradation, limiting evaluation. Alignment: Thoracic levocurvature. Exaggerated thoracic kyphosis. No significant spondylolisthesis. Vertebrae: Minimal chronic anterior wedging of the T12 vertebral body, unchanged. Vertebral body height is otherwise maintained. No significant marrow edema or focal suspicious osseous lesion. Multilevel ventrolateral osteophytes. Cord: Within the limitations of motion degradation, no signal abnormality is identified within the thoracic spinal cord. Paraspinal and  other soft tissues: Trace bilateral pleural effusions. Paraspinal soft tissues unremarkable. Disc levels: Multilevel disc degeneration. Most notably, there is multilevel moderate disc degeneration within the mid to lower thoracic spine. There are shallow multilevel disc bulges. Additionally, there are small left center disc protrusions at T7-T8 and T8-T9. The disc protrusions and some of the disc bulges mildly efface the ventral thecal sac (without significant spinal  cord mass effect). No significant foraminal stenosis within the thoracic spine. IMPRESSION: Motion degraded examination, limiting evaluation. Within the limitations of motion degradation, no signal abnormality is identified the spinal cord at the thoracic levels. Thoracic spondylosis, as outlined. There are levels where shallow disc bulges and small disc protrusions minimally efface the ventral thecal sac (without significant spinal cord mass effect). No significant foraminal stenosis. Thoracic levocurvature. Exaggerated thoracic kyphosis. Unchanged mild chronic anterior wedge deformity of the T12 vertebral body. Trace bilateral pleural effusions. Electronically Signed   By: Kellie Simmering D.O.   On: 10/23/2020 14:03   DG CHEST PORT 1 VIEW  Result Date: 10/23/2020 CLINICAL DATA:  65 year old male with history of pulmonary infiltrates. EXAM: PORTABLE CHEST 1 VIEW COMPARISON:  Chest x-ray 10/22/2020. FINDINGS: An endotracheal tube is in place with tip 6.1 cm above the carina. A nasogastric tube is seen extending into the stomach, however, the tip of the nasogastric tube extends below the lower margin of the image. Lung volumes are slightly low. Opacities in the lung bases bilaterally which may reflect areas of atelectasis and/or consolidation. Probable small volume of right sided pleural fluid, likely tracking in the major fissure. No definite left pleural effusion. No pneumothorax. No evidence of pulmonary edema. Heart size is normal. Upper mediastinal  contours are distorted by patient rotation to the right. IMPRESSION: 1. Support apparatus, as above. 2. Bibasilar opacities, similar to the prior study, which may reflect areas of atelectasis and/or consolidation, with small right pleural effusion. Electronically Signed   By: Vinnie Langton M.D.   On: 10/23/2020 08:41   DG Chest Port 1 View  Result Date: 10/22/2020 CLINICAL DATA:  Respiratory failure EXAM: PORTABLE CHEST 1 VIEW COMPARISON:  10/22/2020 FINDINGS: Endotracheal tube is been placed with its tip 5.7 cm above the carina. Nasogastric tube extends into the left upper quadrant of the abdomen, beyond the margin of the examination. Right basilar collapse and consolidation is unchanged. There is increasing opacification of the left lung base likely reflecting progressive left basilar collapse. No pneumothorax. Probable small right pleural effusion is unchanged. Cardiac size within normal limits. IMPRESSION: Endotracheal tube and nasogastric tube in appropriate position. Persistent right basilar collapse and consolidation. Suspected small superimposed right pleural effusion is unchanged. Progressive left basilar collapse. Electronically Signed   By: Fidela Salisbury M.D.   On: 10/22/2020 20:55   DG CHEST PORT 1 VIEW  Result Date: 10/22/2020 CLINICAL DATA:  Respiratory distress. Additional history provided: Acute respiratory distress, patient presently on BiPAP. EXAM: PORTABLE CHEST 1 VIEW COMPARISON:  Prior chest radiographs 10/21/2020 and earlier. FINDINGS: The superior most lung apices are excluded from the field of view. An enteric tube passes below level of the left hemidiaphragm with tip excluded from the field of view. Heart size within normal limits. Unchanged hazy opacity at the right lung base at least partially reflecting atelectasis. Superimposed pneumonia, as well as a small right pleural effusion, cannot be excluded. Unchanged mild left basilar atelectasis and/or consolidation with small left  pleural effusion. No evidence of pneumothorax. No acute bony abnormality identified. Thoracic dextrocurvature. IMPRESSION: Persistent hazy opacity at the right lung base at least partially reflecting atelectasis. However, superimposed pneumonia, as well as a small right pleural effusion, cannot be excluded. Unchanged mild left basilar atelectasis and/or consolidation with small left pleural effusion. Electronically Signed   By: Kellie Simmering D.O.   On: 10/22/2020 15:12   DG CHEST PORT 1 VIEW  Result Date: 10/21/2020 CLINICAL DATA:  Shortness of breath. EXAM:  PORTABLE CHEST 1 VIEW COMPARISON:  Chest x-ray 10/21/2020. FINDINGS: Endotracheal tube and enteric tube have been removed. The cardiomediastinal silhouette is stable and within normal limits. There is minimal atelectasis in the left lung base. Stable calcified small nodule in the right mid lung. The lungs are otherwise clear. No pleural effusion or pneumothorax identified. Degenerative changes affect the spine. Healed left proximal humerus fracture visualized. IMPRESSION: 1. Endotracheal and enteric tubes have been removed. 2. Minimal left base atelectasis. Electronically Signed   By: Ronney Asters M.D.   On: 10/21/2020 22:51   DG CHEST PORT 1 VIEW  Result Date: 10/21/2020 CLINICAL DATA:  Endotracheal 2 placement for ventilator support. EXAM: PORTABLE CHEST 1 VIEW COMPARISON:  10/20/2020 FINDINGS: Endotracheal tube tip 3 cm above the carina. Orogastric or nasogastric tube enters the stomach. There is worsened infiltrate/volume loss in the right lower lobe. Mild volume loss also in the left lower lobe. Upper lungs remain clear. IMPRESSION: Worsening volume loss in the right lower lobe. Slight worsening atelectasis in the left lower lobe. Electronically Signed   By: Nelson Chimes M.D.   On: 10/21/2020 09:22   DG Chest Port 1 View  Result Date: 10/20/2020 CLINICAL DATA:  Shortness of breath, respiratory failure EXAM: PORTABLE CHEST 1 VIEW COMPARISON:   10/21/2020 FINDINGS: Endotracheal tube terminates approximately 3.1 cm above the carina. Enteric tube courses below the diaphragm with distal tip beyond the inferior margin of the film. Heart size within normal limits. New right lower lobe airspace opacity. Left lung is clear. No appreciable pleural fluid collection. No pneumothorax. IMPRESSION: New right lower lobe airspace opacity may reflect atelectasis versus pneumonia. Electronically Signed   By: Davina Poke D.O.   On: 10/20/2020 08:28   DG Chest Portable 1 View  Result Date: 10/21/2020 CLINICAL DATA:  Intubation EXAM: PORTABLE CHEST 1 VIEW COMPARISON:  10/16/2020 FINDINGS: Patient is rotated. Interval placement of endotracheal tube with distal tip terminating approximately 3.7 cm above the carina. Heart size is normal. No focal airspace consolidation, pleural effusion, or pneumothorax. Chronic posttraumatic deformity of the proximal left humerus. IMPRESSION: Endotracheal tube appropriately positioned. No acute cardiopulmonary findings. Electronically Signed   By: Davina Poke D.O.   On: 10/18/2020 13:28   DG Chest Port 1 View  Result Date: 11/13/2020 CLINICAL DATA:  Short of breath. EXAM: PORTABLE CHEST 1 VIEW COMPARISON:  10/11/2020 and older exams. FINDINGS: Cardiac silhouette is normal in size. No mediastinal or hilar masses. Lungs are clear.  No pleural effusion or pneumothorax. Skeletal structures are grossly intact. IMPRESSION: 1. No acute cardiopulmonary disease. Electronically Signed   By: Lajean Manes M.D.   On: 11/03/2020 12:48   DG Chest Portable 1 View  Result Date: 10/11/2020 CLINICAL DATA:  Fatigue, weakness, confusion, short of breath EXAM: PORTABLE CHEST 1 VIEW COMPARISON:  03/10/2020, 06/11/2020 FINDINGS: Single frontal view of the chest demonstrates a stable cardiac silhouette. Background emphysema again noted. No acute airspace disease, effusion, or pneumothorax. There are no acute displaced fractures. IMPRESSION: 1.  Emphysema, no acute airspace disease. 2. Please refer to previous chest CT report 06/11/2020 recommending three-month CT follow-up for right lower lobe nodularity. Electronically Signed   By: Randa Ngo M.D.   On: 10/11/2020 19:19   DG Shoulder Left Port  Result Date: 10/21/2020 CLINICAL DATA:  Status post fall. EXAM: LEFT SHOULDER COMPARISON:  March 09, 2020 FINDINGS: A chronic fracture deformity is seen involving the head and surgical neck of the proximal left humerus. This is seen as an acute  fracture deformity on the prior study. Degenerative changes are seen involving the left acromioclavicular joint. Soft tissues are unremarkable. IMPRESSION: Chronic fracture deformity of the proximal left humerus. CT correlation is recommended if acute fracture remains of clinical concern. Electronically Signed   By: Virgina Norfolk M.D.   On: 10/21/2020 19:23   DG Abd Portable 1V  Result Date: 10/25/2020 CLINICAL DATA:  Feeding tube placement EXAM: PORTABLE ABDOMEN - 1 VIEW COMPARISON:  10/24/2020 FINDINGS: Interval removal of NG tube and placement of feeding tube with the tip in the mid stomach region. IMPRESSION: Feeding tube tip in the mid stomach. Electronically Signed   By: Rolm Baptise M.D.   On: 10/25/2020 13:00   DG Abd Portable 1V  Result Date: 10/22/2020 CLINICAL DATA:  Orogastric tube placement EXAM: PORTABLE ABDOMEN - 1 VIEW COMPARISON:  10/22/2020 FINDINGS: Previously noted nasoenteric feeding tube is been removed. Nasogastric tube is been placed with its tip within the mid body of the stomach within the left mid abdomen. Multiple gas-filled minimally dilated loops of small bowel are seen throughout the abdomen, progressive since prior examination, with gas seen within the distal colon suggestive of a progressive ileus. Pelvis and right flank excluded from view. IMPRESSION: Nasoenteric feeding tube removed. Nasogastric tube in appropriate position within the mid body of the stomach.  Progressive suspected ileus. Electronically Signed   By: Fidela Salisbury M.D.   On: 10/22/2020 20:58   DG Abd Portable 1V  Result Date: 10/22/2020 CLINICAL DATA:  Feeding tube placement EXAM: PORTABLE ABDOMEN - 1 VIEW COMPARISON:  11/02/2020 FINDINGS: Limited radiograph of the lower chest and upper abdomen was obtained for the purposes of enteric tube localization. Large-bore enteric tube is seen coursing below the diaphragm with distal tip terminating within the expected location of the gastric body. Multiple dilated loops of small bowel are present within the upper abdomen. IMPRESSION: 1. Large bore enteric tube with distal tip terminating within the expected location of the gastric body. 2. Multiple dilated loops of small bowel within the upper abdomen, may reflect obstruction or ileus. Electronically Signed   By: Davina Poke D.O.   On: 10/22/2020 12:29   DG Abd Portable 1V  Result Date: 11/03/2020 CLINICAL DATA:  Advanced orogastric tube. History of respiratory arrest. EXAM: PORTABLE ABDOMEN - 1 VIEW COMPARISON:  X-ray abdomen 11/05/2020. FINDINGS: Endotracheal tube terminates 4 cm above the carina. Enteric tube is noted coursing below the hemidiaphragm with tip and side port overlying the expected region of the gastric lumen. Nonobstructive bowel gas pattern. Limited evaluation of the lungs due to technique. IMPRESSION: Interval advancement of an enteric tube in good position. Electronically Signed   By: Iven Finn M.D.   On: 10/18/2020 20:09   DG Abd Portable 1V  Result Date: 11/06/2020 CLINICAL DATA:  Check gastric catheter placement EXAM: PORTABLE ABDOMEN - 1 VIEW COMPARISON:  Chest x-ray from earlier in the same day. FINDINGS: Gastric catheter is again noted in the distal esophagus. This should be advanced several cm deeper into the stomach. No obstructive changes are seen. IMPRESSION: Gastric catheter within the distal esophagus. This should be advanced several cm deeper into the  stomach. Electronically Signed   By: Inez Catalina M.D.   On: 11/04/2020 19:38   DG Humerus Left  Result Date: 10/21/2020 CLINICAL DATA:  Limited range of motion EXAM: LEFT HUMERUS - 2+ VIEW COMPARISON:  10/21/2020 FINDINGS: No acute displaced fracture or malalignment. Chronic fracture deformity of the proximal left humerus. Soft tissues are unremarkable  IMPRESSION: Chronic fracture deformity of the proximal left humerus. No acute osseous abnormality Electronically Signed   By: Donavan Foil M.D.   On: 10/21/2020 19:40   EEG adult  Result Date: 10/24/2020 Lora Havens, MD     10/24/2020  1:30 PM Patient Name: [name] MRN: [id number] Epilepsy Attending: Lora Havens Referring Physician/Provider: Anibal Henderson, NP Date: 10/24/2020 Duration: 25.48 mins Patient history: 65 y.o. male who presented for evaluation of unresponsiveness and respiratory failure on 10/4 with waxing and waning mental status with perseveration, global weakness, and variable extremity weakness versus left-sided neglect on examination. EEG to evaluate for seizure. Level of alertness: lethargic AEDs during EEG study: GBP Technical aspects: This EEG study was done with scalp electrodes positioned according to the 10-20 International system of electrode placement. Electrical activity was acquired at a sampling rate of 500Hz  and reviewed with a high frequency filter of 70Hz  and a low frequency filter of 1Hz . EEG data were recorded continuously and digitally stored. Description: EEG showed continuous generalized polymorphic sharply contoured 3 to 6 Hz theta-delta slowing. Generalized periodic discharges with triphasic morphology at 1.5-2 Hz were also noted.  Hyperventilation and photic stimulation were not performed.   ABNORMALITY - Periodic discharges with triphasic morphology, generalized ( GPDs) - Continuous slow, generalized IMPRESSION: This study showed generalized periodic discharges with triphasic morphology at 1.5-2 Hz which  is on the ictal-interictal continuum with low potential for seizures. This eeg pattern can also been seen due toxic-metabolic causes. Additionally there is moderate to severe diffuse encephalopathy, nonspecific etiology. No seizures were seen throughout the recording. Lora Havens   ECHOCARDIOGRAM COMPLETE  Result Date: 10/20/2020    ECHOCARDIOGRAM REPORT   Patient Name:   [facility] A Kallam Date of Exam: 10/20/2020 Medical Rec #:  [id number]    Height:       69.0 in Accession #:    [id number]   Weight:       135.8 lb Date of Birth:  January 09, 1956   BSA:          1.753 m Patient Age:    31 years     BP:           134/63 mmHg Patient Gender: M            HR:           87 bpm. Exam Location:  Inpatient Procedure: 2D Echo, Color Doppler, Cardiac Doppler and Intracardiac            Opacification Agent Indications:    Acute Respiratory Distress R06.03  History:        Patient has prior history of Echocardiogram examinations, most                 recent 12/24/2019. COPD; Risk Factors:Hypertension, Diabetes and                 Dyslipidemia.  Sonographer:    Raquel Sarna Senior RDCS Referring Phys: [place] Comments: Technically difficult study due to poor echo windows. Image acquisition challenging due to COPD. Very poor echo windows due to COPD, scanned supine on artificial respirator. IMPRESSIONS  1. Left ventricular ejection fraction, by estimation, is >75%. The left ventricle has hyperdynamic function. The left ventricle has no regional wall motion abnormalities. Left ventricular diastolic parameters are consistent with Grade I diastolic dysfunction (impaired relaxation).  2. Right ventricular systolic function is low normal. The right ventricular size is not well visualized.  3. The  mitral valve was not well visualized. No evidence of mitral valve regurgitation.  4. The aortic valve was not well visualized. Aortic valve regurgitation is not visualized.  5. The inferior vena cava is normal in size with  greater than 50% respiratory variability, suggesting right atrial pressure of 3 mmHg. FINDINGS  Left Ventricle: Left ventricular ejection fraction, by estimation, is >75%. The left ventricle has hyperdynamic function. The left ventricle has no regional wall motion abnormalities. Definity contrast agent was given IV to delineate the left ventricular endocardial borders. The left ventricular internal cavity size was normal in size. There is no left ventricular hypertrophy. Left ventricular diastolic parameters are consistent with Grade I diastolic dysfunction (impaired relaxation). Indeterminate filling pressures. Right Ventricle: The right ventricular size is not well visualized. Right vetricular wall thickness was not well visualized. Right ventricular systolic function is low normal. Left Atrium: Left atrial size was not well visualized. Right Atrium: Right atrial size was not well visualized. Pericardium: There is no evidence of pericardial effusion. Mitral Valve: The mitral valve was not well visualized. No evidence of mitral valve regurgitation. Tricuspid Valve: The tricuspid valve is not well visualized. Tricuspid valve regurgitation is not demonstrated. Aortic Valve: The aortic valve was not well visualized. Aortic valve regurgitation is not visualized. Pulmonic Valve: The pulmonic valve was not well visualized. Pulmonic valve regurgitation is not visualized. Aorta: The aortic root was not well visualized, the ascending aorta was not well visualized and the aortic arch was not well visualized. Venous: The inferior vena cava is normal in size with greater than 50% respiratory variability, suggesting right atrial pressure of 3 mmHg. IAS/Shunts: The interatrial septum was not well visualized.   Diastology LV e' medial:    6.96 cm/s LV E/e' medial:  9.5 LV e' lateral:   8.16 cm/s LV E/e' lateral: 8.1  RIGHT VENTRICLE RV S prime:     10.20 cm/s TAPSE (M-mode): 1.9 cm LEFT ATRIUM           Index LA Vol (A4C): 56.2  ml 32.07 ml/m  AORTIC VALVE LVOT Vmax:   138.00 cm/s LVOT Vmean:  97.500 cm/s LVOT VTI:    0.239 m MITRAL VALVE MV Area (PHT): 1.98 cm    SHUNTS MV Decel Time: 384 msec    Systemic VTI: 0.24 m MV E velocity: 66.40 cm/s MV A velocity: 93.00 cm/s MV E/A ratio:  0.71 Lyman Bishop MD Electronically signed by Lyman Bishop MD Signature Date/Time: 10/20/2020/4:25:56 PM    Final     Microbiology No results found for this or any previous visit (from the past 240 hour(s)).  Lab Basic Metabolic Panel: No results for input(s): NA, K, CL, CO2, GLUCOSE, BUN, CREATININE, CALCIUM, MG, PHOS in the last 168 hours. Liver Function Tests: No results for input(s): AST, ALT, ALKPHOS, BILITOT, PROT, ALBUMIN in the last 168 hours. No results for input(s): LIPASE, AMYLASE in the last 168 hours. No results for input(s): AMMONIA in the last 168 hours. CBC: No results for input(s): WBC, NEUTROABS, HGB, HCT, MCV, PLT in the last 168 hours. Cardiac Enzymes: No results for input(s): CKTOTAL, CKMB, CKMBINDEX, TROPONINI in the last 168 hours. Sepsis Labs: No results for input(s): PROCALCITON, WBC, LATICACIDVEN in the last 168 hours.  Procedures/Operations  See epic   Audria Nine 11/07/2020, 9:40 PM

## 2020-11-16 NOTE — Progress Notes (Signed)
Palliative Medicine Inpatient Follow Up Note     Chart Reviewed. Patient assessed at the bedside.   Somnolent but easily aroused. Successfully extubated however continues to require intermittent BiPAP use with some respiratory distress. No acute distress noted. Complains of generalized pain. Endorses occasional shortness of breath. RN medicating with PRNs. Patient expresses he is tired and has request for care to focus on his comfort. He cannot move his upper or lower extremities. Wife is at the bedside.   Detailed discussion with Mrs. Ketter. She is tearful expressing her awareness of poor prognosis and patient suffering. Emotional support provided. She states she is in agreement with focusing on his comfort sharing he has struggled for a long time with his health and she knows physically and mentally he is tired.   Education provided on comfort focused care while hospitalized in addition to outpatient. We discussed outpatient hospice in the home vs hospice home facility. Wife is tearful expressing she wish he could be at home however with a toddler running around she does not feel she would be able to provide the care that he needs as well as concerns with symptom management. She is requesting [facility] referral as she had previous experience with her mother. Education provided on hospice home referral process. Wife verbalized understanding and appreciation.   Questions addressed and support provided.    Objective Assessment: Vital Signs Vitals:   Nov 23, 2020 0731 November 23, 2020 0754  BP: (!) 156/50   Pulse: 76 81  Resp: (!) 23   Temp:    SpO2: 99%     Intake/Output Summary (Last 24 hours) at November 23, 2020 1148 Last data filed at 11-23-2020 0800 Gross per 24 hour  Intake 1575.5 ml  Output 925 ml  Net 650.5 ml   Last Weight  Most recent update: 11/23/20  6:43 AM    Weight  64 kg (141 lb 1.5 oz)            Gen:  somnolent but easily aroused, frail, ill-appearing  CV: RRR PULM:  rhonchi ABD: soft/nontender/nondistended/normal bowel sounds EXT: edema  Neuro: Alert and oriented x3 when awake, weak appearing, upper extremity weakness   SUMMARY OF RECOMMENDATIONS   DNR/DNI All care to focus on comfort. Will discontinue all medications and interventions not comfort focused.  Wife has expressed wishes for hospice home referral ([facility] is preference due to previous experiences). TOC aware and Olivia Mackie, RN ([facility]) Morphine PRN for pain/air hunger/comfort (if respiratory distress becomes uncontrolled may consider morphine drip which wife is in agreement with)  Robinul PRN for excessive secretions Ativan PRN for agitation/anxiety Zofran PRN for nausea Liquifilm tears PRN for dry eyes Haldol PRN for agitation/anxiety May have comfort feeding Comfort cart for family Unrestricted visitations in the setting of EOL (per policy) Oxygen PRN 2L or less for comfort. No escalation.   PMT will continue to support and follow on as needed basis. Please secure chat for urgent needs.   Discussed with Dr. Ruthann Cancer   Time Total: 65 min.   Visit consisted of counseling and education dealing with the complex and emotionally intense issues of symptom management and palliative care in the setting of serious and potentially life-threatening illness.Greater than 50%  of this time was spent counseling and coordinating care related to the above assessment and plan.  Alda Lea, AGPCNP-BC  Palliative Medicine Team [phone]  Palliative Medicine Team providers are available by phone from 7am to 7pm daily and can be reached through the team cell phone. Should this patient  require assistance outside of these hours, please call the patient's attending physician.

## 2020-11-16 DEATH — deceased

## 2020-12-17 ENCOUNTER — Encounter: Payer: Medicare Other | Admitting: Internal Medicine

## 2022-12-07 IMAGING — CT CT SHOULDER*L* W/O CM
2 series · 10 of 14 positions shown, 12 images · non-contrast
Comparison: Left shoulder x-rays dated March 09, 2020. MRI left
shoulder dated September 04, 2008.

CLINICAL DATA: Left proximal humerus fracture after fall.

EXAM:
CT OF THE UPPER LEFT EXTREMITY WITHOUT CONTRAST
TECHNIQUE: Multidetector CT imaging of the left shoulder was performed
according to the standard protocol.

[Series 4: ax bone · axial · 0.39mm/px · z∈[-386,-259]mm · 5 of 110 slices shown, 7 images]
[im 19/110  soft-tissue]
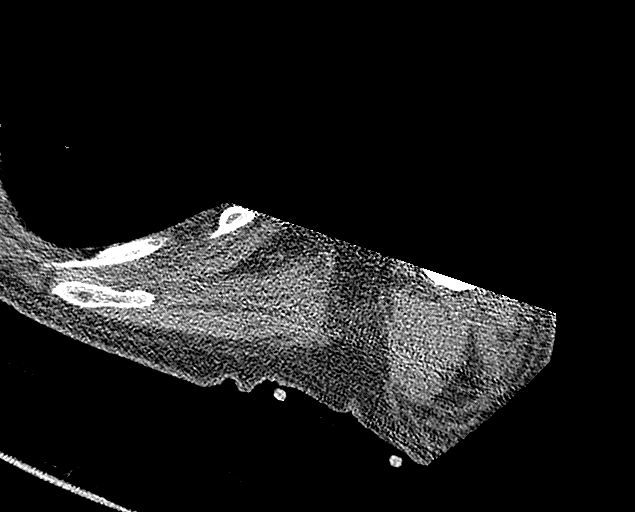
[im 19/110  bone]
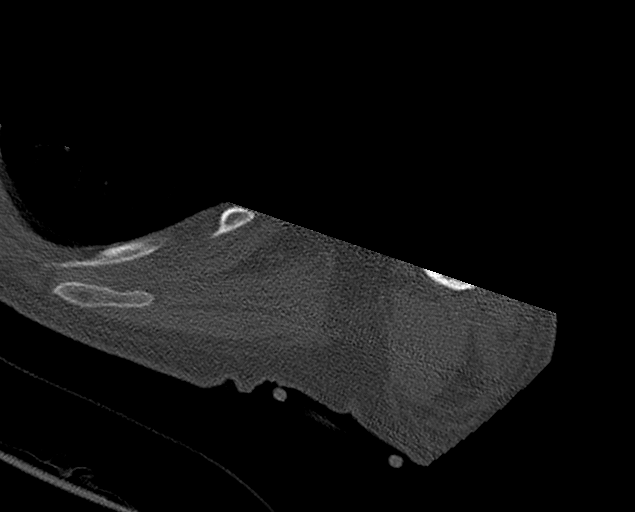
[im 37/110  bone]
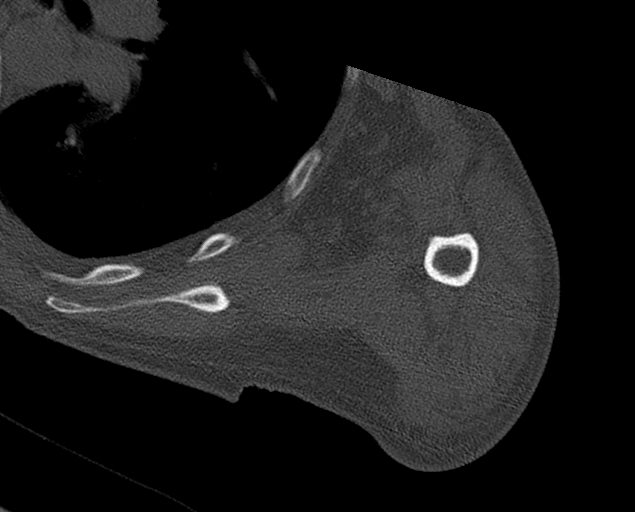
[im 55/110  bone]
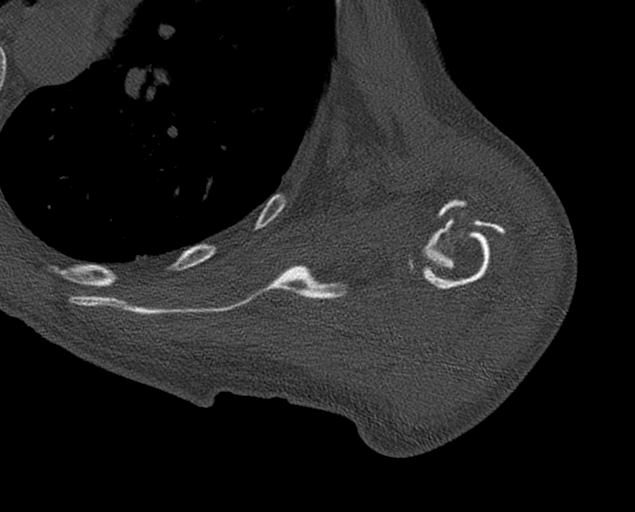
[im 73/110  bone]
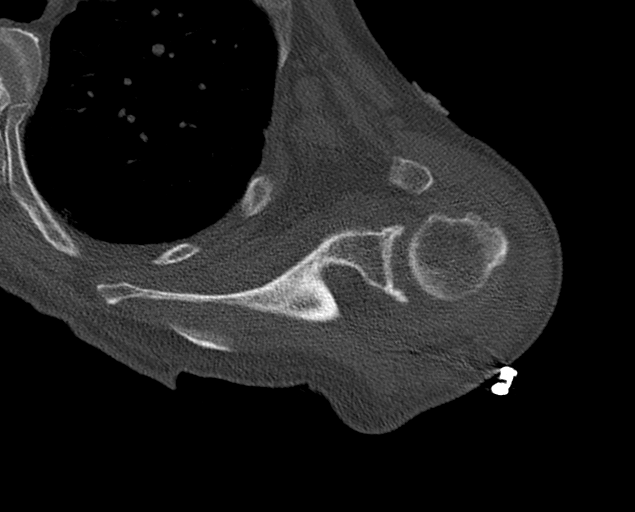
[im 91/110  soft-tissue]
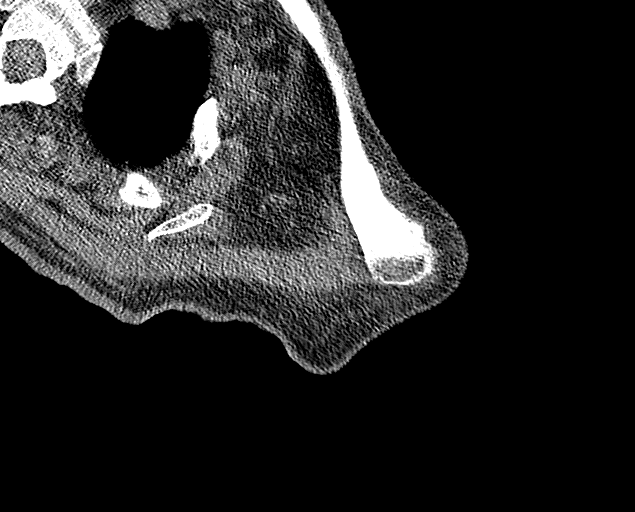
[im 91/110  bone]
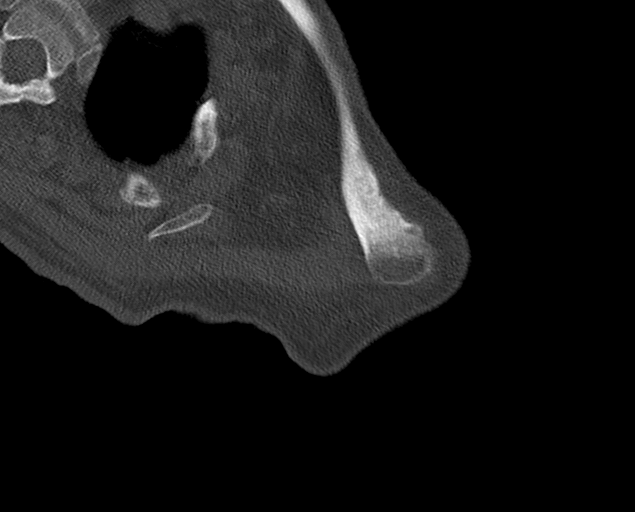

[Series 7: ax st · axial · 0.39mm/px · z∈[-386,-259]mm · 5 of 110 slices shown]
[im 19/110  bone]
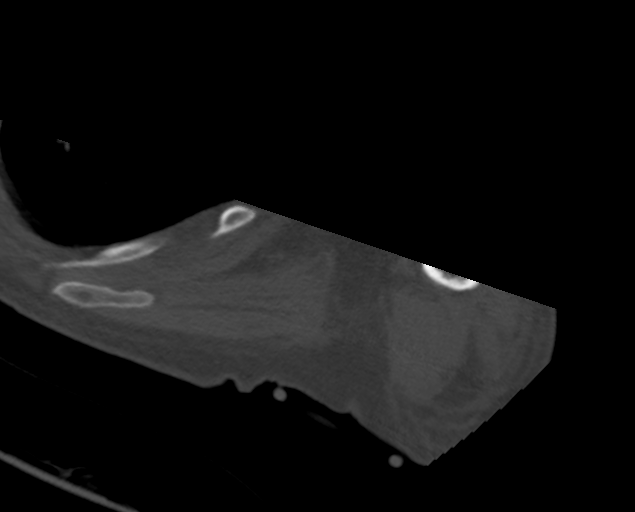
[im 37/110  bone]
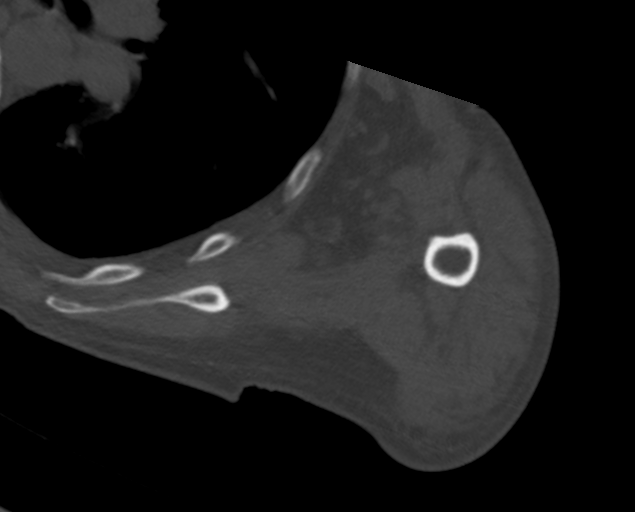
[im 55/110  bone]
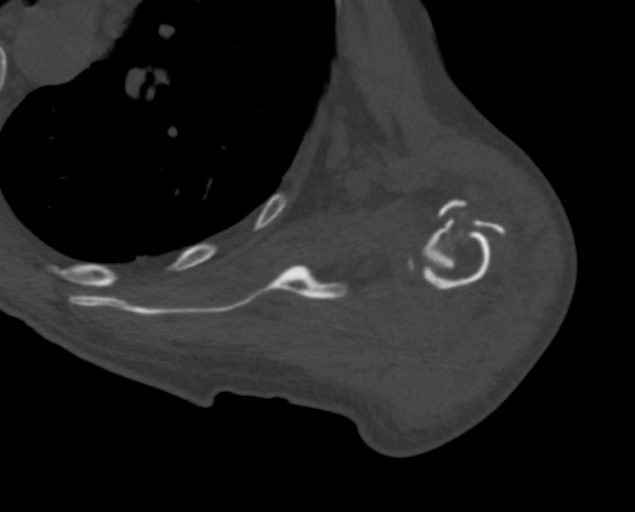
[im 73/110  bone]
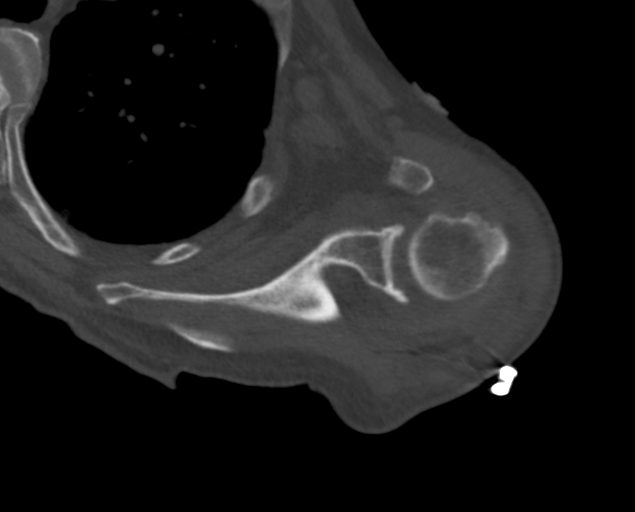
[im 91/110  bone]
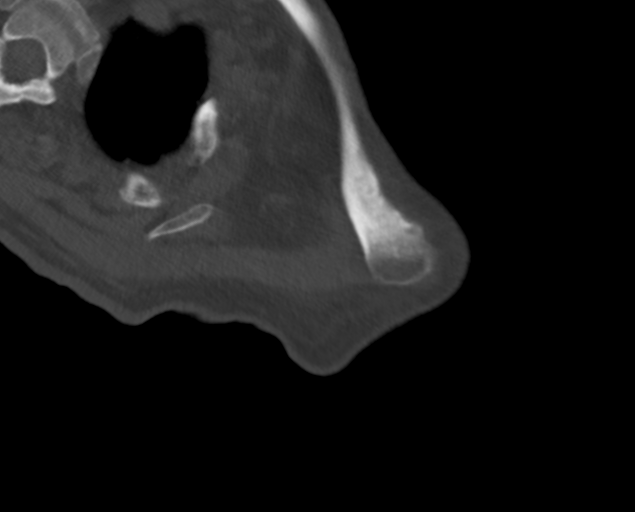

[10 of 14 positions shown; findings below may reference images not displayed]

FINDINGS: Bones/Joint/Cartilage

Acute comminuted impacted fracture of the proximal humerus surgical
neck extending into the posterior greater tuberosity. There is up to
6 mm anterior displacement and 1.5 cm of impaction. No additional
fracture. No dislocation. Mild acromioclavicular and glenohumeral
joint space narrowing with small marginal osteophytes. Type 3
acromion with prominent subacromial spurring. Small glenohumeral
joint effusion.

Ligaments

Ligaments are suboptimally evaluated by CT.

Muscles and Tendons
Grossly intact. Scattered foci of hydroxyapatite deposition within
the supraspinatus and infraspinatus tendons. No muscle atrophy.

Soft tissue
No fluid collection or hematoma. No soft tissue mass. Mild
centrilobular emphysema in the visualized left lung.
IMPRESSION: 1. Acute comminuted mildly displaced and impacted fracture of the
proximal humerus surgical neck extending into the posterior greater
tuberosity.
2. Mild glenohumeral and acromioclavicular osteoarthritis.
3. Emphysema ([id number]).
